# Patient Record
Sex: Female | Born: 1980 | Race: Black or African American | Hispanic: No | Smoking: Current every day smoker
Health system: Southern US, Community
[De-identification: ages and names within clinical notes are randomized; demographics above are authoritative.]

## PROBLEM LIST (undated history)

## (undated) HISTORY — PX: APPENDECTOMY: SHX54

---

## 2004-07-30 ENCOUNTER — Emergency Department: Payer: Self-pay | Admitting: Internal Medicine

## 2004-10-24 ENCOUNTER — Emergency Department: Payer: Self-pay | Admitting: Emergency Medicine

## 2004-11-29 ENCOUNTER — Emergency Department: Payer: Self-pay | Admitting: Emergency Medicine

## 2007-05-12 ENCOUNTER — Emergency Department (HOSPITAL_COMMUNITY): Admission: EM | Admit: 2007-05-12 | Discharge: 2007-05-12 | Payer: Self-pay | Admitting: Emergency Medicine

## 2007-05-12 ENCOUNTER — Ambulatory Visit: Payer: Self-pay | Admitting: *Deleted

## 2007-08-22 ENCOUNTER — Ambulatory Visit: Payer: Self-pay | Admitting: Internal Medicine

## 2008-02-01 ENCOUNTER — Emergency Department (HOSPITAL_COMMUNITY): Admission: EM | Admit: 2008-02-01 | Discharge: 2008-02-01 | Payer: Self-pay | Admitting: Emergency Medicine

## 2008-07-09 ENCOUNTER — Encounter (INDEPENDENT_AMBULATORY_CARE_PROVIDER_SITE_OTHER): Payer: Self-pay | Admitting: Internal Medicine

## 2008-07-09 ENCOUNTER — Ambulatory Visit: Payer: Self-pay | Admitting: Internal Medicine

## 2008-07-09 LAB — CONVERTED CEMR LAB
AST: 55 units/L — ABNORMAL HIGH (ref 0–37)
Calcium: 9 mg/dL (ref 8.4–10.5)
Glucose, Bld: 90 mg/dL (ref 70–99)
Potassium: 3.7 meq/L (ref 3.5–5.3)
Total Bilirubin: 0.4 mg/dL (ref 0.3–1.2)
Total Protein: 7.3 g/dL (ref 6.0–8.3)

## 2009-02-26 ENCOUNTER — Ambulatory Visit: Payer: Self-pay | Admitting: Family Medicine

## 2009-05-01 ENCOUNTER — Emergency Department (HOSPITAL_COMMUNITY): Admission: EM | Admit: 2009-05-01 | Discharge: 2009-05-01 | Payer: Self-pay | Admitting: Emergency Medicine

## 2009-05-12 ENCOUNTER — Emergency Department (HOSPITAL_COMMUNITY): Admission: EM | Admit: 2009-05-12 | Discharge: 2009-05-12 | Payer: Self-pay | Admitting: Emergency Medicine

## 2011-02-03 ENCOUNTER — Emergency Department: Payer: Self-pay | Admitting: Emergency Medicine

## 2011-12-22 ENCOUNTER — Emergency Department: Payer: Self-pay | Admitting: Emergency Medicine

## 2011-12-22 LAB — COMPREHENSIVE METABOLIC PANEL
Albumin: 3.8 g/dL (ref 3.4–5.0)
Alkaline Phosphatase: 76 U/L (ref 50–136)
Chloride: 107 mmol/L (ref 98–107)
Creatinine: 0.62 mg/dL (ref 0.60–1.30)
Glucose: 80 mg/dL (ref 65–99)
Osmolality: 275 (ref 275–301)
SGOT(AST): 28 U/L (ref 15–37)
SGPT (ALT): 34 U/L
Sodium: 139 mmol/L (ref 136–145)
Total Protein: 7.7 g/dL (ref 6.4–8.2)

## 2011-12-22 LAB — URINALYSIS, COMPLETE
Bilirubin,UR: NEGATIVE
Blood: NEGATIVE
Ketone: NEGATIVE
Nitrite: NEGATIVE
Ph: 6 (ref 4.5–8.0)
Protein: NEGATIVE
RBC,UR: <1 /HPF (ref 0–5)
Specific Gravity: 1.005 (ref 1.003–1.030)

## 2011-12-22 LAB — LIPASE, BLOOD: Lipase: 567 U/L — ABNORMAL HIGH (ref 73–393)

## 2011-12-22 LAB — HCG, QUANTITATIVE, PREGNANCY: Beta Hcg, Quant.: <1 m[IU]/mL — ABNORMAL LOW

## 2011-12-22 LAB — CBC
HCT: 33.8 % — ABNORMAL LOW (ref 35.0–47.0)
MCH: 25.5 pg — ABNORMAL LOW (ref 26.0–34.0)

## 2013-04-24 LAB — URINALYSIS, COMPLETE
Bacteria: NONE SEEN
Bilirubin,UR: NEGATIVE
Blood: NEGATIVE
Leukocyte Esterase: NEGATIVE
Nitrite: NEGATIVE
Ph: 7 (ref 4.5–8.0)
Protein: NEGATIVE
RBC,UR: <1 /HPF (ref 0–5)
Squamous Epithelial: 5

## 2013-04-24 LAB — CBC
HCT: 35.6 % (ref 35.0–47.0)
HGB: 11.7 g/dL — ABNORMAL LOW (ref 12.0–16.0)
MCH: 26.6 pg (ref 26.0–34.0)
MCHC: 32.9 g/dL (ref 32.0–36.0)
MCV: 81 fL (ref 80–100)
RDW: 17.5 % — ABNORMAL HIGH (ref 11.5–14.5)
WBC: 13.8 10*3/uL — ABNORMAL HIGH (ref 3.6–11.0)

## 2013-04-24 LAB — COMPREHENSIVE METABOLIC PANEL
Albumin: 4.1 g/dL (ref 3.4–5.0)
Alkaline Phosphatase: 81 U/L (ref 50–136)
Anion Gap: 5 — ABNORMAL LOW (ref 7–16)
BUN: 7 mg/dL (ref 7–18)
Calcium, Total: 9.1 mg/dL (ref 8.5–10.1)
Chloride: 105 mmol/L (ref 98–107)
Creatinine: 0.51 mg/dL — ABNORMAL LOW (ref 0.60–1.30)
EGFR (Non-African Amer.): >60
Osmolality: 266 (ref 275–301)
SGOT(AST): 43 U/L — ABNORMAL HIGH (ref 15–37)
Total Protein: 8.1 g/dL (ref 6.4–8.2)

## 2013-04-25 ENCOUNTER — Observation Stay: Payer: Self-pay | Admitting: Surgery

## 2013-04-26 LAB — PATHOLOGY REPORT

## 2014-01-24 DIAGNOSIS — F17200 Nicotine dependence, unspecified, uncomplicated: Secondary | ICD-10-CM | POA: Insufficient documentation

## 2014-11-01 NOTE — H&P (Signed)
   Subjective/Chief Complaint Right sided flank/RUQ pain, N/V/D x 1 day   History of Present Illness Jocelyn [name] is a pleasant 34 yo F who presents after waking up with right sided abdominal pain this am.  She says that it has gotten worse.  Worse with movement.  Also some loose BM and prior NB/NB emesis.  No appetite.  + subjective chills.  Has never had pain like this before   Past History HTN H/o tubal ligation   Past Medical Health Hypertension, Smoking   Past Med/Surgical Hx:  Hypertension:   None, patient reports no medical history.:   ALLERGIES:  No Known Allergies:   Family and Social History:  Family History Hypertension  Cancer  Breast ca   Social History positive  tobacco, positive ETOH, Social EtOH   [facility]  [place], here with husband, children   Review of Systems:  Subjective/Chief Complaint RUQ/R flank pain   Fever/Chills Yes   Cough No   Sputum No   Abdominal Pain Yes   Diarrhea No   Constipation No   Nausea/Vomiting Yes   SOB/DOE No   Chest Pain No   Dysuria No   Tolerating Diet Nauseated  Vomiting   Physical Exam:  GEN well developed, well nourished, no acute distress   HEENT pale conjunctivae, PERRL, moist oral mucosa   RESP normal resp effort  clear BS  no use of accessory muscles   CARD regular rate  no murmur   ABD positive tenderness  denies Flank Tenderness  no hernia  soft  normal BS  + rebound, + rovsig   LYMPH negative neck, negative axillae   EXTR negative cyanosis/clubbing   SKIN normal to palpation, No rashes, No ulcers, skin turgor good   NEURO cranial nerves intact, negative rigidity, negative tremor, follows commands, strength:, motor/sensory function intact   PSYCH alert, A+O to time, place, person, good insight    Assessment/Admission Diagnosis Jocelyn [name] presents with right flank/RUQ pain, leukocytosis, nausea/vomiting.  CT with dilated appendix with periappendiceal stranding with fecolithis.   Tender over presumed site of appendix.   Plan Clinical and radiographic appendicitis. To or for lap appendectomy.   Electronic Signatures: Jarvis NewcomerLundquist, Keysi Oelkers A (MD)  (Signed 14-Oct-14 20:18)  Authored: CHIEF COMPLAINT and HISTORY, PAST MEDICAL/SURGIAL HISTORY, ALLERGIES, FAMILY AND SOCIAL HISTORY, REVIEW OF SYSTEMS, PHYSICAL EXAM, ASSESSMENT AND PLAN   Last Updated: 14-Oct-14 20:18 by Jarvis NewcomerLundquist, Anaijah Augsburger A (MD)

## 2014-11-01 NOTE — Op Note (Signed)
PATIENT NAME:  Jocelyn [name], Jocelyn [name] MR#:  [id number] DATE OF BIRTH:  20-Dec-1980  DATE OF PROCEDURE:  04/24/2013  PREOPERATIVE DIAGNOSIS: Acute appendicitis.   POSTOPERATIVE DIAGNOSIS: Acute appendicitis with gangrenous tip.   PROCEDURE PERFORMED: Laparoscopic appendectomy.   ESTIMATED BLOOD LOSS:  15 mL.   COMPLICATIONS: None.   SPECIMEN: Appendix.  INDICATION FOR SURGERY: [name] is a pleasant, 34 year old female with history of right flank and upper quadrant pain and leukocytosis. She had a CT scan which showed a dilated appendix with periappendiceal stranding and appendicoliths. She was thus brought to the Operating Room for laparoscopic appendectomy.   DETAILS OF PROCEDURE: Informed consent was obtained. [name] was brought to the Operating Room suite. She was laid supine on the Operating Room table. She was induced. Endotracheal tube was placed, general anesthesia was administered. Her abdomen was then prepped and draped in standard surgical fashion. A timeout was then performed, correctly identifying the patient name, operative site, and procedure to be performed. A supraumbilical incision was made and was deepened down to the fascia. The fascia was incised. The peritoneum was entered. Two stay sutures were placed through the peritoneum. The Hasson trocar was placed in the abdomen. The abdomen was insufflated. Her CT and her clinical exam indicated appendix was in the right upper quadrant. I then placed a left upper quadrant 5 mm trocar under direct visualization, and an epigastric 5 mm trocar. The appendix was then visualized and grasped. It appeared to be normal at the base, at the cecum, and gangrenous and extremely inflamed at the tip I then used a [place] to make a hole in the mesoappendix at the base of the appendix. I then used an endoscopic stapler to transect the appendix flush with the base of the cecum. I then used a hook to mobilize the appendix off the underlying retroperitoneum and  kidney. Once I was able to mobilize the appendix enough, I used an endoscopic stapler across the mesoappendix. The appendix was then taken out with an Endo Catch bag through the umbilical port. I then irrigated the abdomen and looked at the appendiceal stump. It was hemostatic and flush with the cecum. I then also looked at the mesoappendix staple line. There was a small area of bleeding, which was controlled with point electrocautery. The trocars were then taken out under direct visualization. The abdomen was desufflated. The supraumbilical fascia was closed with a 0 Vicryl figure-of-eight.  At all port sites, the skin was then approximated with interrupted 4-0 Monocryl deep dermal interrupted sutures. Steri-Strips, Telfa gauze and Tegaderm were then used to complete the dressing. The patient was then awoken, extubated, and brought to the postanesthesia care unit. There were no immediate complications. Needle, sponge, and instrument count was correct at the end of the procedure.  ____________________________ Si Raiderhristopher A. Lani Havlik, MD cal:cg D: 04/24/2013 23:24:24 ET [name]: 04/24/2013 23:48:10 ET JOB#: [id number]  cc: Cristal Deerhristopher A. Annessa Satre, MD, <Dictator> Jarvis NewcomerHRISTOPHER A Nainika Newlun MD ELECTRONICALLY SIGNED 04/26/2013 20:11

## 2016-08-01 ENCOUNTER — Emergency Department: Admission: EM | Admit: 2016-08-01 | Discharge: 2016-08-01 | Discharge status: Absent without leave | Payer: Self-pay

## 2019-08-03 DIAGNOSIS — Z6834 Body mass index (BMI) 34.0-34.9, adult: Secondary | ICD-10-CM | POA: Insufficient documentation

## 2019-08-31 DIAGNOSIS — Z0000 Encounter for general adult medical examination without abnormal findings: Secondary | ICD-10-CM | POA: Insufficient documentation

## 2019-12-16 ENCOUNTER — Other Ambulatory Visit: Payer: Self-pay

## 2019-12-16 DIAGNOSIS — E1165 Type 2 diabetes mellitus with hyperglycemia: Secondary | ICD-10-CM | POA: Insufficient documentation

## 2019-12-16 DIAGNOSIS — E1142 Type 2 diabetes mellitus with diabetic polyneuropathy: Secondary | ICD-10-CM | POA: Insufficient documentation

## 2019-12-16 DIAGNOSIS — Z7984 Long term (current) use of oral hypoglycemic drugs: Secondary | ICD-10-CM | POA: Insufficient documentation

## 2019-12-16 DIAGNOSIS — D509 Iron deficiency anemia, unspecified: Secondary | ICD-10-CM | POA: Insufficient documentation

## 2019-12-16 LAB — CBC WITH DIFFERENTIAL/PLATELET
Abs Immature Granulocytes: 0.03 10*3/uL (ref 0.00–0.07)
Basophils Absolute: 0.1 10*3/uL (ref 0.0–0.1)
Basophils Relative: 1 %
Eosinophils Absolute: 0.1 10*3/uL (ref 0.0–0.5)
Eosinophils Relative: 1 %
HCT: 29.1 % — ABNORMAL LOW (ref 36.0–46.0)
Hemoglobin: 7.8 g/dL — ABNORMAL LOW (ref 12.0–15.0)
Immature Granulocytes: 0 %
Lymphocytes Relative: 20 %
Lymphs Abs: 2.2 10*3/uL (ref 0.7–4.0)
MCH: 17.6 pg — ABNORMAL LOW (ref 26.0–34.0)
MCHC: 26.8 g/dL — ABNORMAL LOW (ref 30.0–36.0)
MCV: 65.7 fL — ABNORMAL LOW (ref 80.0–100.0)
Monocytes Absolute: 0.9 10*3/uL (ref 0.1–1.0)
Monocytes Relative: 9 %
Neutro Abs: 7.5 10*3/uL (ref 1.7–7.7)
Neutrophils Relative %: 69 %
Platelets: 422 10*3/uL — ABNORMAL HIGH (ref 150–400)
RBC: 4.43 MIL/uL (ref 3.87–5.11)
RDW: 19.9 % — ABNORMAL HIGH (ref 11.5–15.5)
WBC: 10.8 10*3/uL — ABNORMAL HIGH (ref 4.0–10.5)
nRBC: 0.2 % (ref 0.0–0.2)

## 2019-12-16 LAB — BASIC METABOLIC PANEL
Anion gap: 11 (ref 5–15)
BUN: 5 mg/dL — ABNORMAL LOW (ref 6–20)
CO2: 24 mmol/L (ref 22–32)
Calcium: 9 mg/dL (ref 8.9–10.3)
Chloride: 102 mmol/L (ref 98–111)
Creatinine, Ser: 0.69 mg/dL (ref 0.44–1.00)
GFR calc Af Amer: >60 mL/min (ref >60)
GFR calc non Af Amer: >60 mL/min (ref >60)
Glucose, Bld: 212 mg/dL — ABNORMAL HIGH (ref 70–99)
Potassium: 3.6 mmol/L (ref 3.5–5.1)
Sodium: 137 mmol/L (ref 135–145)

## 2019-12-16 MED ORDER — ACETAMINOPHEN 325 MG PO TABS
650.0000 mg | ORAL_TABLET | Freq: Once | ORAL | Status: AC
  Administered 2019-12-16: 650 mg via ORAL
  Filled 2019-12-16: qty 2

## 2019-12-16 MED ORDER — SODIUM CHLORIDE 0.9 % IV BOLUS
1000.0000 mL | Freq: Once | INTRAVENOUS | Status: AC
  Administered 2019-12-16: 1000 mL via INTRAVENOUS

## 2019-12-16 MED ORDER — FENTANYL CITRATE (PF) 100 MCG/2ML IJ SOLN: 50.0000 ug | Freq: Once | INTRAMUSCULAR | Status: DC

## 2019-12-16 NOTE — ED Notes (Signed)
Spoke with Dr. Colon Branch about patient, see orders.

## 2019-12-16 NOTE — ED Triage Notes (Signed)
Patient reports tingling/numbness and pain in both legs and feet for the past week.  Patient tearful in triage.

## 2019-12-16 NOTE — ED Notes (Signed)
Lab called to report hemolysis of Type and Screen. This RN asked lab to come and draw the repeat.

## 2019-12-17 ENCOUNTER — Emergency Department
Admission: EM | Admit: 2019-12-17 | Discharge: 2019-12-17 | Disposition: A | Discharge status: Home / Self Care | Payer: Self-pay | Attending: Emergency Medicine | Admitting: Emergency Medicine

## 2019-12-17 DIAGNOSIS — D509 Iron deficiency anemia, unspecified: Secondary | ICD-10-CM

## 2019-12-17 DIAGNOSIS — E119 Type 2 diabetes mellitus without complications: Secondary | ICD-10-CM

## 2019-12-17 DIAGNOSIS — I739 Peripheral vascular disease, unspecified: Secondary | ICD-10-CM | POA: Diagnosis present

## 2019-12-17 DIAGNOSIS — I10 Essential (primary) hypertension: Secondary | ICD-10-CM | POA: Diagnosis present

## 2019-12-17 DIAGNOSIS — E1165 Type 2 diabetes mellitus with hyperglycemia: Secondary | ICD-10-CM

## 2019-12-17 DIAGNOSIS — G629 Polyneuropathy, unspecified: Secondary | ICD-10-CM

## 2019-12-17 HISTORY — DX: Iron deficiency anemia, unspecified: D50.9

## 2019-12-17 LAB — IRON AND TIBC
Iron: 15 ug/dL — ABNORMAL LOW (ref 28–170)
Saturation Ratios: 3 % — ABNORMAL LOW (ref 10.4–31.8)
TIBC: 556 ug/dL — ABNORMAL HIGH (ref 250–450)
UIBC: 541 ug/dL

## 2019-12-17 LAB — FERRITIN: Ferritin: 4 ng/mL — ABNORMAL LOW (ref 11–307)

## 2019-12-17 MED ORDER — ONDANSETRON 4 MG PO TBDP: 4.0000 mg | ORAL_TABLET | Freq: Three times a day (TID) | ORAL | 0 refills | Status: DC | PRN

## 2019-12-17 MED ORDER — METFORMIN HCL 500 MG PO TABS: 500.0000 mg | ORAL_TABLET | Freq: Two times a day (BID) | ORAL | 0 refills | Status: DC

## 2019-12-17 MED ORDER — IRON 325 (65 FE) MG PO TABS: 1.0000 | ORAL_TABLET | Freq: Every day | ORAL | 0 refills | Status: DC

## 2019-12-17 NOTE — ED Provider Notes (Signed)
[facility] Emergency Department Provider Note  ____________________________________________  Time seen: Approximately 1:29 AM  I have reviewed the triage vital signs and the nursing notes.   HISTORY  Chief Complaint Numbness    HPI Jocelyn [name] is a 39 y.o. female with no known past medical history who comes the ED complaining of bilateral foot and anterior leg pain for the past week.  Constant, worse with walking, no alleviating factors.  Not radiating.  Denies back pain.  No falls or recent trauma.  No motor weakness or change in balance or coordination.  No fevers or chills, no rash or wounds.   She reports normal menses, occurring approximately monthly, 5 to 7 days, described as light.  No black or bloody stools.   Past medical history noncontributory  Past surgical history includes appendectomy      Prior to Admission medications   Medication Sig Start Date End Date Taking? Authorizing Provider  Ferrous Sulfate (IRON) 325 (65 Fe) MG TABS Take 1 tablet (325 mg total) by mouth daily. 12/17/19 02/15/20  Sharman Cheek, MD  metFORMIN (GLUCOPHAGE) 500 MG tablet Take 1 tablet (500 mg total) by mouth 2 (two) times daily with a meal. 12/17/19   Sharman Cheek, MD  ondansetron (ZOFRAN ODT) 4 MG disintegrating tablet Take 1 tablet (4 mg total) by mouth every 8 (eight) hours as needed for nausea or vomiting. 12/17/19   Sharman Cheek, MD     Allergies Patient has no known allergies.   No family history on file.  Social History Social History   Tobacco Use   Smoking status: Not on file  Substance Use Topics   Alcohol use: Not on file   Drug use: Not on file    Review of Systems  Constitutional:   No fever or chills.  ENT:   No sore throat. No rhinorrhea. Cardiovascular:   No chest pain or syncope. Respiratory:   No dyspnea or cough. Gastrointestinal:   Negative for abdominal pain, vomiting and diarrhea.  Musculoskeletal:   Negative  for focal pain or swelling All other systems reviewed and are negative except as documented above in ROS and HPI.  ____________________________________________   PHYSICAL EXAM:  VITAL SIGNS: ED Triage Vitals  Enc Vitals Group     BP 12/16/19 1928 (!) 130/100     Pulse Rate 12/16/19 1928 (!) 119     Resp 12/16/19 1928 20     Temp 12/16/19 1928 99.1 F (37.3 C)     Temp Source 12/16/19 1928 Oral     SpO2 12/16/19 1928 99 %     Weight 12/16/19 1926 190 lb (86.2 kg)     Height 12/16/19 1926 5\' 5"  (1.651 m)     Head Circumference --      Peak Flow --      Pain Score 12/17/19 0049 6     Pain Loc --      Pain Edu? --      Excl. in [place]? --     Vital signs reviewed, nursing assessments reviewed.   Constitutional:   Alert and oriented. Non-toxic appearance. Eyes:   Conjunctivae are normal. EOMI. PERRL.  No nystagmus ENT      Head:   Normocephalic and atraumatic.      Nose:   Wearing a mask.      Mouth/Throat:   Wearing a mask.      Neck:   No meningismus. Full ROM. Hematological/Lymphatic/Immunilogical:   No cervical lymphadenopathy. Cardiovascular:  RRR. Symmetric bilateral radial and DP pulses.  No murmurs. Cap refill less than 2 seconds. Respiratory:   Normal respiratory effort without tachypnea/retractions. Breath sounds are clear and equal bilaterally. No wheezes/rales/rhonchi. Gastrointestinal:   Soft and nontender. Non distended. There is no CVA tenderness.  No rebound, rigidity, or guarding. Musculoskeletal:   Normal range of motion in all extremities. No joint effusions.  No lower extremity tenderness.  No edema.  Symmetric calf circumference, negative Homans Neurologic:   Normal speech and language.  Motor grossly intact. No pronator drift.  Normal finger-to-nose.  Normal gait. No acute focal neurologic deficits are appreciated.  Skin:    Skin is warm, dry and intact. No rash noted.  No petechiae, purpura, or bullae.  ____________________________________________     LABS (pertinent positives/negatives) (all labs ordered are listed, but only abnormal results are displayed) Labs Reviewed  CBC WITH DIFFERENTIAL/PLATELET - Abnormal; Notable for the following components:      Result Value   WBC 10.8 (*)    Hemoglobin 7.8 (*)    HCT 29.1 (*)    MCV 65.7 (*)    MCH 17.6 (*)    MCHC 26.8 (*)    RDW 19.9 (*)    Platelets 422 (*)    All other components within normal limits  BASIC METABOLIC PANEL - Abnormal; Notable for the following components:   Glucose, Bld 212 (*)    BUN 5 (*)    All other components within normal limits  IRON AND TIBC - Abnormal; Notable for the following components:   Iron 15 (*)    TIBC 556 (*)    Saturation Ratios 3 (*)    All other components within normal limits  FERRITIN - Abnormal; Notable for the following components:   Ferritin 4 (*)    All other components within normal limits  TYPE AND SCREEN  TYPE AND SCREEN   ____________________________________________   EKG  Interpreted by me Sinus tachycardia rate 121.  Normal axis and intervals.  Normal QRS ST segments and T waves.  No ischemic changes.  ____________________________________________    RADIOLOGY  No results found.  ____________________________________________   PROCEDURES Procedures  ____________________________________________    CLINICAL IMPRESSION / ASSESSMENT AND PLAN / ED COURSE  Medications ordered in the ED: Medications  fentaNYL (SUBLIMAZE) injection 50 mcg (50 mcg Intravenous Refused 12/16/19 2045)  sodium chloride 0.9 % bolus 1,000 mL (0 mLs Intravenous Stopped 12/17/19 0028)  acetaminophen (TYLENOL) tablet 650 mg (650 mg Oral Given 12/16/19 2050)    Pertinent labs & imaging results that were available during my care of the patient were reviewed by me and considered in my medical decision making (see chart for details).  Jocelyn [name] was evaluated in Emergency Department on 12/17/2019 for the symptoms described in the history of  present illness. She was evaluated in the context of the global COVID-19 pandemic, which necessitated consideration that the patient might be at risk for infection with the SARS-CoV-2 virus that causes COVID-19. Institutional protocols and algorithms that pertain to the evaluation of patients at risk for COVID-19 are in a state of rapid change based on information released by regulatory bodies including the CDC and federal and state organizations. These policies and algorithms were followed during the patient's care in the ED.   Patient presents with paresthesias of bilateral lower legs for the past week.  Exam is benign and reassuring.  Vital signs unremarkable on my exam.  Doubt stroke or intracranial hemorrhage tumor herniated disc or  spinal cord syndrome.  No evidence of skin or soft tissue infection, compartment syndrome, DVT.  She is wearing elastic waist leggings which could be causing some meralgia paresthetica syndrome, although this would not be expected to extend to the foot.  Chemistry panel shows a blood glucose of about 220, consistent with type 2 diabetes.  Most likely this is polyneuropathy related to uncontrolled diabetes and hyperglycemia and microcirculatory dysfunction.  She also has chronic anemia with a hemoglobin of 7.8 today but RBC level that is unchanged from 7 years ago.  No symptoms to suggest acute blood loss.  Iron panel consistent with iron deficiency.  I will start her on Metformin and iron supplement, instructed the patient to follow-up with her primary care doctor within 1 week.      ____________________________________________   FINAL CLINICAL IMPRESSION(S) / ED DIAGNOSES    Final diagnoses:  Polyneuropathy  Type 2 diabetes mellitus with hyperglycemia, without long-term current use of insulin (HCC)  Iron deficiency anemia, unspecified iron deficiency anemia type     ED Discharge Orders         Ordered    metFORMIN (GLUCOPHAGE) 500 MG tablet  2 times daily  with meals     12/17/19 0128    Ferrous Sulfate (IRON) 325 (65 Fe) MG TABS  Daily     12/17/19 0128    ondansetron (ZOFRAN ODT) 4 MG disintegrating tablet  Every 8 hours PRN     12/17/19 0128          Portions of this note were generated with dragon dictation software. Dictation errors may occur despite best attempts at proofreading.   Sharman Cheek, MD 12/17/19 [phone]

## 2019-12-21 DIAGNOSIS — R209 Unspecified disturbances of skin sensation: Secondary | ICD-10-CM | POA: Insufficient documentation

## 2020-01-04 ENCOUNTER — Emergency Department
Admission: EM | Admit: 2020-01-04 | Discharge: 2020-01-04 | Disposition: A | Discharge status: Home / Self Care | Payer: Self-pay | Attending: Emergency Medicine | Admitting: Emergency Medicine

## 2020-01-04 ENCOUNTER — Other Ambulatory Visit: Payer: Self-pay

## 2020-01-04 ENCOUNTER — Encounter: Payer: Self-pay | Admitting: Family Medicine

## 2020-01-04 ENCOUNTER — Emergency Department: Payer: Self-pay

## 2020-01-04 DIAGNOSIS — Z7902 Long term (current) use of antithrombotics/antiplatelets: Secondary | ICD-10-CM | POA: Insufficient documentation

## 2020-01-04 DIAGNOSIS — I743 Embolism and thrombosis of arteries of the lower extremities: Secondary | ICD-10-CM

## 2020-01-04 DIAGNOSIS — Z7982 Long term (current) use of aspirin: Secondary | ICD-10-CM | POA: Insufficient documentation

## 2020-01-04 DIAGNOSIS — I82412 Acute embolism and thrombosis of left femoral vein: Secondary | ICD-10-CM | POA: Insufficient documentation

## 2020-01-04 LAB — GLUCOSE, CAPILLARY: Glucose-Capillary: 90 mg/dL (ref 70–99)

## 2020-01-04 MED ORDER — OXYCODONE HCL 5 MG PO TABS
5.0000 mg | ORAL_TABLET | Freq: Once | ORAL | Status: AC
  Administered 2020-01-04: 5 mg via ORAL
  Filled 2020-01-04: qty 1

## 2020-01-04 MED ORDER — ASPIRIN EC 81 MG PO TBEC
81.0000 mg | DELAYED_RELEASE_TABLET | Freq: Every day | ORAL | 3 refills | Status: AC
Start: 2020-01-04 — End: 2021-01-03

## 2020-01-04 MED ORDER — CLOPIDOGREL BISULFATE 75 MG PO TABS
75.0000 mg | ORAL_TABLET | Freq: Every day | ORAL | Status: DC
  Administered 2020-01-04: 75 mg via ORAL
  Filled 2020-01-04: qty 1

## 2020-01-04 MED ORDER — OXYCODONE-ACETAMINOPHEN 10-325 MG PO TABS: 1.0000 | ORAL_TABLET | Freq: Four times a day (QID) | ORAL | 0 refills | Status: DC | PRN

## 2020-01-04 MED ORDER — CLOPIDOGREL BISULFATE 75 MG PO TABS: 75.0000 mg | ORAL_TABLET | Freq: Every day | ORAL | 3 refills | Status: DC

## 2020-01-04 NOTE — ED Provider Notes (Signed)
[facility] Emergency Department Provider Note ____________________________________________  Time seen: Approximately 7:35 AM  I have reviewed the triage vital signs and the nursing notes.   HISTORY  Chief Complaint Foot Pain    HPI Jocelyn [name] is a 39 y.o. female with a history of anemia and diabetes who presents to the emergency department for evaluation and treatment of left foot pain and swelling. Symptoms started upon awakening 3 weeks ago. No injury. She has been evaluated here and by primary care for the same. No relief with anything prescribed thus far.  Diabetes, Type 2 Iron deficiency anemia.   Prior to Admission medications   Medication Sig Start Date End Date Taking? Authorizing Provider  aspirin EC 81 MG tablet Take 1 tablet (81 mg total) by mouth daily. Swallow whole. 01/04/20 01/03/21  Stegmayer, Janalyn Harder, PA-C  clopidogrel (PLAVIX) 75 MG tablet Take 1 tablet (75 mg total) by mouth daily. 01/04/20   Stegmayer, Janalyn Harder, PA-C  Ferrous Sulfate (IRON) 325 (65 Fe) MG TABS Take 1 tablet (325 mg total) by mouth daily. 12/17/19 02/15/20  Carrie Mew, MD  metFORMIN (GLUCOPHAGE) 500 MG tablet Take 1 tablet (500 mg total) by mouth 2 (two) times daily with a meal. 12/17/19   Carrie Mew, MD  ondansetron (ZOFRAN ODT) 4 MG disintegrating tablet Take 1 tablet (4 mg total) by mouth every 8 (eight) hours as needed for nausea or vomiting. 12/17/19   Carrie Mew, MD  oxyCODONE-acetaminophen (PERCOCET) 10-325 MG tablet Take 1 tablet by mouth every 6 (six) hours as needed for pain. 01/04/20   Stegmayer, Janalyn Harder, PA-C    Allergies Patient has no known allergies.  No family history on file.  Social History Social History   Tobacco Use  . Smoking status: Not on file  Substance Use Topics  . Alcohol use: Not on file  . Drug use: Not on file    Review of Systems Constitutional: Negative for fever. Cardiovascular: Negative for chest  pain. Respiratory: Negative for shortness of breath. Musculoskeletal: Positive for left foot pain. Skin: Positive for swelling of the right foot.  Neurological: Negative for decrease in sensation  ____________________________________________   PHYSICAL EXAM:  VITAL SIGNS: ED Triage Vitals  Enc Vitals Group     BP 01/04/20 0614 (!) 159/115     Pulse Rate 01/04/20 0614 (!) 102     Resp 01/04/20 0614 18     Temp 01/04/20 0614 99.1 F (37.3 C)     Temp Source 01/04/20 0614 Oral     SpO2 01/04/20 0614 99 %     Weight 01/04/20 0615 186 lb (84.4 kg)     Height 01/04/20 0615 5\' 5"  (1.651 m)     Head Circumference --      Peak Flow --      Pain Score 01/04/20 0615 10     Pain Loc --      Pain Edu? --      Excl. in [place]? --     Constitutional: Alert and oriented. Well appearing and in no acute distress. Eyes: Conjunctivae are clear without discharge or drainage Head: Atraumatic Neck: Supple Respiratory: No cough. Respirations are even and unlabored. Musculoskeletal: Left foot slightly swollen, cooler than right foot, DP and PT pulse 2+, normal skin color. Neurologic: Motor and sensory function of left lower extremity intact.   Skin: Left foot slightly cooler than right, normal skin color.  Psychiatric: Affect and behavior are appropriate.  ____________________________________________   LABS (all labs  ordered are listed, but only abnormal results are displayed)  Labs Reviewed  GLUCOSE, CAPILLARY  CBG MONITORING, ED   ____________________________________________  RADIOLOGY  US of the left lower extremity shows no DVT. Incidental finding of occlusion of the distal femoropopliteal segment.  I, Kem Boroughs, personally viewed and evaluated these images (plain radiographs) as part of my medical decision making, as well as reviewing the written report by the radiologist.  US Venous Img Lower Unilateral Left  Result Date: 01/04/2020 CLINICAL DATA:  39 year old female with a  history of left foot swelling EXAM: LEFT LOWER EXTREMITY VENOUS DOPPLER ULTRASOUND TECHNIQUE: Gray-scale sonography with graded compression, as well as color Doppler and duplex ultrasound were performed to evaluate the lower extremity deep venous systems from the level of the common femoral vein and including the common femoral, femoral, profunda femoral, popliteal and calf veins including the posterior tibial, peroneal and gastrocnemius veins when visible. The superficial great saphenous vein was also interrogated. Spectral Doppler was utilized to evaluate flow at rest and with distal augmentation maneuvers in the common femoral, femoral and popliteal veins. COMPARISON:  None. FINDINGS: Contralateral Common Femoral Vein: Respiratory phasicity is normal and symmetric with the symptomatic side. No evidence of thrombus. Normal compressibility. Common Femoral Vein: No evidence of thrombus. Normal compressibility, respiratory phasicity and response to augmentation. Saphenofemoral Junction: No evidence of thrombus. Normal compressibility and flow on color Doppler imaging. Profunda Femoral Vein: No evidence of thrombus. Normal compressibility and flow on color Doppler imaging. Femoral Vein: No evidence of thrombus. Normal compressibility, respiratory phasicity and response to augmentation. Popliteal Vein: No evidence of thrombus. Normal compressibility, respiratory phasicity and response to augmentation. Calf Veins: No evidence of thrombus. Normal compressibility and flow on color Doppler imaging. Superficial Great Saphenous Vein: No evidence of thrombus. Normal compressibility and flow on color Doppler imaging. Other Findings: Incidental finding of occluded distal femoropopliteal segment. IMPRESSION: Sonographic survey of the left lower extremity negative for DVT. Incidental finding of occlusion of the distal femoropopliteal segment, unknown chronicity. Electronically Signed   By: Gilmer Mor D.O.   On: 01/04/2020  07:44   ____________________________________________   PROCEDURES  Procedures  ____________________________________________   INITIAL IMPRESSION / ASSESSMENT AND PLAN / ED COURSE  NICOLAS SISLER is a 39 y.o. who presents to the emergency department for treatment and evaluation of left foot pain with paresthesias. Foot is mildly swollen and cooler than right, but skin color and pulses are normal. Awaiting [place] results. Pain medication ordered.  Vascular consulted regarding incidental finding of occlusion of the distal femoropopliteal segment in the left lower extremity. Dr. Gilda Crease or associate will come evaluate.  Dr. Gilda Crease and PA evaluated patient. Will place orders for Plavix, Percocet, and have her follow up in the office on Monday.  Patient instructed to follow-up with vascular on Monday.Marland Kitchen  She was also instructed to return to the emergency department for symptoms that change or worsen if unable schedule an appointment with orthopedics or primary care.  Medications  clopidogrel (PLAVIX) tablet 75 mg (75 mg Oral Given 01/04/20 1207)  oxyCODONE (Oxy IR/ROXICODONE) immediate release tablet 5 mg (5 mg Oral Given 01/04/20 0740)    Pertinent labs & imaging results that were available during my care of the patient were reviewed by me and considered in my medical decision making (see chart for details).   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   Chinita Pester, FNP 01/04/20 1214    Minna Antis, MD 01/04/20 1515

## 2020-01-04 NOTE — ED Triage Notes (Signed)
Pt in with co left foot numbness states started 3 weeks ago. Was told it was due to diabetes. Pt here for persistent symptoms, swelling noted to left foot. States it is normal for her when she is up and walking.

## 2020-01-04 NOTE — ED Notes (Signed)
See triage note  Presents with left foot numbness   sxs' started about 3 weeks ago  Denies any injury  Positive swelling

## 2020-01-04 NOTE — ED Notes (Signed)
CBG 85 at this time.

## 2020-01-04 NOTE — ED Notes (Signed)
Awaiting for consult to see   States conts to have pain  Family at bedside

## 2020-01-04 NOTE — Discharge Instructions (Signed)
Please follow-up with Dr. Gilda Crease as advised on Monday.  Return to the emergency department if your pain worsens or if your skin gets cold or becomes pale.

## 2020-01-07 LAB — GLUCOSE, CAPILLARY: Glucose-Capillary: 85 mg/dL (ref 70–99)

## 2020-01-10 ENCOUNTER — Other Ambulatory Visit: Payer: Self-pay

## 2020-01-10 ENCOUNTER — Inpatient Hospital Stay
Admission: EM | Admit: 2020-01-10 | Discharge: 2020-01-12 | DRG: 272 | Disposition: A | Discharge status: Home / Self Care | Payer: Self-pay | Attending: Internal Medicine | Admitting: Internal Medicine

## 2020-01-10 ENCOUNTER — Encounter: Payer: Self-pay | Admitting: Emergency Medicine

## 2020-01-10 DIAGNOSIS — Z8249 Family history of ischemic heart disease and other diseases of the circulatory system: Secondary | ICD-10-CM

## 2020-01-10 DIAGNOSIS — D509 Iron deficiency anemia, unspecified: Secondary | ICD-10-CM | POA: Diagnosis present

## 2020-01-10 DIAGNOSIS — F17210 Nicotine dependence, cigarettes, uncomplicated: Secondary | ICD-10-CM | POA: Diagnosis present

## 2020-01-10 DIAGNOSIS — Z79899 Other long term (current) drug therapy: Secondary | ICD-10-CM

## 2020-01-10 DIAGNOSIS — E1151 Type 2 diabetes mellitus with diabetic peripheral angiopathy without gangrene: Principal | ICD-10-CM | POA: Diagnosis present

## 2020-01-10 DIAGNOSIS — Z7982 Long term (current) use of aspirin: Secondary | ICD-10-CM

## 2020-01-10 DIAGNOSIS — I10 Essential (primary) hypertension: Secondary | ICD-10-CM | POA: Diagnosis present

## 2020-01-10 DIAGNOSIS — I743 Embolism and thrombosis of arteries of the lower extremities: Secondary | ICD-10-CM | POA: Diagnosis present

## 2020-01-10 DIAGNOSIS — I7090 Unspecified atherosclerosis: Secondary | ICD-10-CM

## 2020-01-10 DIAGNOSIS — Z7984 Long term (current) use of oral hypoglycemic drugs: Secondary | ICD-10-CM

## 2020-01-10 DIAGNOSIS — Z20822 Contact with and (suspected) exposure to covid-19: Secondary | ICD-10-CM | POA: Diagnosis present

## 2020-01-10 DIAGNOSIS — I739 Peripheral vascular disease, unspecified: Secondary | ICD-10-CM | POA: Diagnosis present

## 2020-01-10 DIAGNOSIS — I998 Other disorder of circulatory system: Secondary | ICD-10-CM | POA: Diagnosis present

## 2020-01-10 DIAGNOSIS — I70222 Atherosclerosis of native arteries of extremities with rest pain, left leg: Secondary | ICD-10-CM | POA: Diagnosis present

## 2020-01-10 DIAGNOSIS — E119 Type 2 diabetes mellitus without complications: Secondary | ICD-10-CM

## 2020-01-10 DIAGNOSIS — Z7902 Long term (current) use of antithrombotics/antiplatelets: Secondary | ICD-10-CM

## 2020-01-10 LAB — APTT: aPTT: 31 seconds (ref 24–36)

## 2020-01-10 LAB — BASIC METABOLIC PANEL
Anion gap: 9 (ref 5–15)
BUN: 9 mg/dL (ref 6–20)
CO2: 25 mmol/L (ref 22–32)
Calcium: 9.6 mg/dL (ref 8.9–10.3)
Chloride: 101 mmol/L (ref 98–111)
Creatinine, Ser: 0.62 mg/dL (ref 0.44–1.00)
GFR calc Af Amer: >60 mL/min (ref >60)
GFR calc non Af Amer: >60 mL/min (ref >60)
Glucose, Bld: 148 mg/dL — ABNORMAL HIGH (ref 70–99)
Potassium: 4 mmol/L (ref 3.5–5.1)
Sodium: 135 mmol/L (ref 135–145)

## 2020-01-10 LAB — PREGNANCY, URINE: Preg Test, Ur: NEGATIVE

## 2020-01-10 LAB — CBC
HCT: 38.1 % (ref 36.0–46.0)
Hemoglobin: 10.9 g/dL — ABNORMAL LOW (ref 12.0–15.0)
MCH: 22.4 pg — ABNORMAL LOW (ref 26.0–34.0)
MCHC: 28.6 g/dL — ABNORMAL LOW (ref 30.0–36.0)
MCV: 78.2 fL — ABNORMAL LOW (ref 80.0–100.0)
Platelets: 646 10*3/uL — ABNORMAL HIGH (ref 150–400)
RBC: 4.87 MIL/uL (ref 3.87–5.11)
WBC: 7.9 10*3/uL (ref 4.0–10.5)
nRBC: 0 % (ref 0.0–0.2)

## 2020-01-10 LAB — PROTIME-INR
INR: 1 (ref 0.8–1.2)
Prothrombin Time: 13.2 seconds (ref 11.4–15.2)

## 2020-01-10 LAB — GLUCOSE, CAPILLARY: Glucose-Capillary: 94 mg/dL (ref 70–99)

## 2020-01-10 LAB — SARS CORONAVIRUS 2 BY RT PCR (HOSPITAL ORDER, PERFORMED IN ~~LOC~~ HOSPITAL LAB): SARS Coronavirus 2: NEGATIVE

## 2020-01-10 MED ORDER — SODIUM CHLORIDE 0.9 % IV SOLN: INTRAVENOUS | Status: DC

## 2020-01-10 MED ORDER — NICOTINE 21 MG/24HR TD PT24
21.0000 mg | MEDICATED_PATCH | Freq: Every day | TRANSDERMAL | Status: DC
  Filled 2020-01-10 (×2): qty 1

## 2020-01-10 MED ORDER — HYDROMORPHONE HCL 1 MG/ML IJ SOLN
1.0000 mg | INTRAMUSCULAR | Status: DC | PRN
  Administered 2020-01-10 – 2020-01-12 (×9): 1 mg via INTRAVENOUS
  Filled 2020-01-10 (×8): qty 1

## 2020-01-10 MED ORDER — ASPIRIN EC 81 MG PO TBEC
81.0000 mg | DELAYED_RELEASE_TABLET | Freq: Every day | ORAL | Status: DC
  Administered 2020-01-12: 81 mg via ORAL
  Filled 2020-01-10 (×2): qty 1

## 2020-01-10 MED ORDER — GABAPENTIN 300 MG PO CAPS
300.0000 mg | ORAL_CAPSULE | Freq: Three times a day (TID) | ORAL | Status: DC
  Administered 2020-01-12: 300 mg via ORAL
  Filled 2020-01-10 (×2): qty 1

## 2020-01-10 MED ORDER — OXYCODONE-ACETAMINOPHEN 5-325 MG PO TABS
1.0000 | ORAL_TABLET | Freq: Four times a day (QID) | ORAL | Status: DC | PRN
  Administered 2020-01-10 – 2020-01-12 (×3): 1 via ORAL
  Filled 2020-01-10 (×3): qty 1

## 2020-01-10 MED ORDER — ONDANSETRON HCL 4 MG/2ML IJ SOLN
4.0000 mg | Freq: Once | INTRAMUSCULAR | Status: AC
  Administered 2020-01-10: 4 mg via INTRAVENOUS
  Filled 2020-01-10: qty 2

## 2020-01-10 MED ORDER — HYDROMORPHONE HCL 1 MG/ML IJ SOLN
1.0000 mg | Freq: Once | INTRAMUSCULAR | Status: AC
  Administered 2020-01-10: 1 mg via INTRAVENOUS
  Filled 2020-01-10: qty 1

## 2020-01-10 MED ORDER — SODIUM CHLORIDE 0.9 % IV BOLUS
500.0000 mL | Freq: Once | INTRAVENOUS | Status: AC
  Administered 2020-01-10: 500 mL via INTRAVENOUS

## 2020-01-10 MED ORDER — SODIUM CHLORIDE 0.9 % IV BOLUS
1000.0000 mL | Freq: Once | INTRAVENOUS | Status: AC
  Administered 2020-01-10: 1000 mL via INTRAVENOUS

## 2020-01-10 MED ORDER — HEPARIN (PORCINE) 25000 UT/250ML-% IV SOLN
1200.0000 [IU]/h | INTRAVENOUS | Status: DC
  Administered 2020-01-10 – 2020-01-11 (×2): 1200 [IU]/h via INTRAVENOUS
  Filled 2020-01-10 (×3): qty 250

## 2020-01-10 MED ORDER — FERROUS SULFATE 325 (65 FE) MG PO TABS
325.0000 mg | ORAL_TABLET | Freq: Every day | ORAL | Status: DC
  Administered 2020-01-12: 325 mg via ORAL
  Filled 2020-01-10 (×2): qty 1

## 2020-01-10 MED ORDER — HEPARIN BOLUS VIA INFUSION
4000.0000 [IU] | Freq: Once | INTRAVENOUS | Status: AC
  Administered 2020-01-10: 4000 [IU] via INTRAVENOUS
  Filled 2020-01-10: qty 4000

## 2020-01-10 MED ORDER — LISINOPRIL 20 MG PO TABS
20.0000 mg | ORAL_TABLET | Freq: Every day | ORAL | Status: DC
  Administered 2020-01-11 – 2020-01-12 (×2): 20 mg via ORAL
  Filled 2020-01-10 (×2): qty 1

## 2020-01-10 MED ORDER — INSULIN ASPART 100 UNIT/ML ~~LOC~~ SOLN
0.0000 [IU] | Freq: Three times a day (TID) | SUBCUTANEOUS | Status: DC
  Filled 2020-01-10: qty 1

## 2020-01-10 MED ORDER — AMLODIPINE BESYLATE 5 MG PO TABS
5.0000 mg | ORAL_TABLET | Freq: Every day | ORAL | Status: DC
  Administered 2020-01-11 – 2020-01-12 (×2): 5 mg via ORAL
  Filled 2020-01-10 (×2): qty 1

## 2020-01-10 MED ORDER — HYDRALAZINE HCL 20 MG/ML IJ SOLN
5.0000 mg | INTRAMUSCULAR | Status: DC | PRN
  Filled 2020-01-10: qty 1

## 2020-01-10 MED ORDER — ONDANSETRON HCL 4 MG/2ML IJ SOLN: 4.0000 mg | Freq: Three times a day (TID) | INTRAMUSCULAR | Status: DC | PRN

## 2020-01-10 MED ORDER — ACETAMINOPHEN 325 MG PO TABS: 650.0000 mg | ORAL_TABLET | Freq: Four times a day (QID) | ORAL | Status: DC | PRN

## 2020-01-10 MED ORDER — OXYCODONE-ACETAMINOPHEN 10-325 MG PO TABS: 1.0000 | ORAL_TABLET | Freq: Four times a day (QID) | ORAL | Status: DC | PRN

## 2020-01-10 MED ORDER — INSULIN ASPART 100 UNIT/ML ~~LOC~~ SOLN: 0.0000 [IU] | Freq: Every day | SUBCUTANEOUS | Status: DC

## 2020-01-10 MED ORDER — OXYCODONE HCL 5 MG PO TABS
5.0000 mg | ORAL_TABLET | Freq: Four times a day (QID) | ORAL | Status: DC | PRN
  Administered 2020-01-10 – 2020-01-12 (×3): 5 mg via ORAL
  Filled 2020-01-10 (×3): qty 1

## 2020-01-10 NOTE — ED Triage Notes (Addendum)
Pt in via POV, known arterial occlusion to left leg per finding of last ER visit, given the option to be admitted or be seen outpatient on last visit, choosing to go home.  Vascular was unable to schedule her until 7/8, states she is unable to deal with pain until then.  To triage via wheelchair, NAD noted at this time.

## 2020-01-10 NOTE — ED Provider Notes (Signed)
[facility] Emergency Department Provider Note  Time seen: 3:25 PM  I have reviewed the triage vital signs and the nursing notes.   HISTORY  Chief Complaint DVT   HPI Jocelyn [name] is a 39 y.o. female with a past medical history of diabetes, presents to the emergency department for left leg pain.  According to the patient for the past 3 weeks or so she has been experiencing fairly significant but progressively worsening left lower extremity pain especially from the knee distally.  Patient states she has been having trouble walking due to the pain is having to use a cane due to the pain.  Patient was seen in the emergency department 01/04/2020 had ultrasound that was negative for DVT but did show a possible femoral/popliteal occlusion.  She was arranged to follow-up with vascular surgery however they state they would not be able to see her until the eighth.  Patient currently taking aspirin and Plavix, Percocet for pain control.  Patient denies any chest pain or shortness of breath.  Denies any fever.   Past Medical History:  Diagnosis Date  . Diabetes mellitus, type 2 (HCC) 12/17/2019  . IDA (iron deficiency anemia) 12/17/2019    There are no problems to display for this patient.   History reviewed. No pertinent surgical history.  Prior to Admission medications   Medication Sig Start Date End Date Taking? Authorizing Provider  aspirin EC 81 MG tablet Take 1 tablet (81 mg total) by mouth daily. Swallow whole. 01/04/20 01/03/21  Stegmayer, Ranae Plumber, PA-C  clopidogrel (PLAVIX) 75 MG tablet Take 1 tablet (75 mg total) by mouth daily. 01/04/20   Stegmayer, Ranae Plumber, PA-C  Ferrous Sulfate (IRON) 325 (65 Fe) MG TABS Take 1 tablet (325 mg total) by mouth daily. 12/17/19 02/15/20  Sharman Cheek, MD  metFORMIN (GLUCOPHAGE) 500 MG tablet Take 1 tablet (500 mg total) by mouth 2 (two) times daily with a meal. 12/17/19   Sharman Cheek, MD  ondansetron (ZOFRAN ODT) 4 MG  disintegrating tablet Take 1 tablet (4 mg total) by mouth every 8 (eight) hours as needed for nausea or vomiting. 12/17/19   Sharman Cheek, MD  oxyCODONE-acetaminophen (PERCOCET) 10-325 MG tablet Take 1 tablet by mouth every 6 (six) hours as needed for pain. 01/04/20   Stegmayer, Cala Bradford A, PA-C    No Known Allergies  No family history on file.  Social History Social History   Tobacco Use  . Smoking status: Current Every Day Smoker    Packs/day: 0.50    Types: Cigarettes  . Smokeless tobacco: Never Used  Vaping Use  . Vaping Use: Never used  Substance Use Topics  . Alcohol use: Yes  . Drug use: Never    Review of Systems Constitutional: Negative for fever. Cardiovascular: Negative for chest pain. Respiratory: Negative for shortness of breath. Gastrointestinal: Negative for abdominal pain Musculoskeletal: Right leg pain Skin: Negative for skin complaints  Neurological: Negative for headache All other ROS negative  ____________________________________________   PHYSICAL EXAM:  VITAL SIGNS: ED Triage Vitals  Enc Vitals Group     BP 01/10/20 1226 (!) 175/105     Pulse Rate 01/10/20 1226 (!) 116     Resp 01/10/20 1226 16     Temp 01/10/20 1226 98.5 F (36.9 C)     Temp Source 01/10/20 1226 Oral     SpO2 01/10/20 1226 100 %     Weight 01/10/20 1226 180 lb (81.6 kg)     Height 01/10/20 1226  5\' 5"  (1.651 m)     Head Circumference --      Peak Flow --      Pain Score 01/10/20 1225 10     Pain Loc --      Pain Edu? --      Excl. in GC? --    Constitutional: Alert and oriented. Well appearing and in no distress. Eyes: Normal exam ENT      Head: Normocephalic and atraumatic.      Mouth/Throat: Mucous membranes are moist. Cardiovascular: Normal rate, regular rhythm.  Respiratory: Normal respiratory effort without tachypnea nor retractions. Breath sounds are clear Gastrointestinal: Soft and nontender. No distention. Musculoskeletal: Moderate tenderness of the left  lower extremity.  No DP pulse palpated.  Positive posterior tibialis pulse.  Unable to palpate or Doppler a DP pulse. Neurologic:  Normal speech and language. No gross focal neurologic deficits Skin:  Skin is warm, dry and intact.  Psychiatric: Mood and affect are normal.   ____________________________________________  EKG viewed and interpreted by myself shows sinus tachycardia 136 bpm with a narrow QRS, normal axis, normal intervals, nonspecific ST changes.  INITIAL IMPRESSION / ASSESSMENT AND PLAN / ED COURSE  Pertinent labs & imaging results that were available during my care of the patient were reviewed by me and considered in my medical decision making (see chart for details).   Patient with increased pain to left lower extremity.  Unable to palpate or Doppler a DP pulse.  Strong posterior tibialis pulse.  Given the patient's pain we will check labs, pain control, start on a heparin infusion.  I spoke to Dr. 03/12/20 of vascular surgery.  We will admit to the medical service and will plan on likely angiography tomorrow.  Jocelyn [name] was evaluated in Emergency Department on 01/10/2020 for the symptoms described in the history of present illness. She was evaluated in the context of the global COVID-19 pandemic, which necessitated consideration that the patient might be at risk for infection with the SARS-CoV-2 virus that causes COVID-19. Institutional protocols and algorithms that pertain to the evaluation of patients at risk for COVID-19 are in a state of rapid change based on information released by regulatory bodies including the CDC and federal and state organizations. These policies and algorithms were followed during the patient's care in the ED.  CRITICAL CARE Performed by: 03/12/2020   Total critical care time: 30 minutes  Critical care time was exclusive of separately billable procedures and treating other patients.  Critical care was necessary to treat or prevent imminent or  life-threatening deterioration.  Critical care was time spent personally by me on the following activities: development of treatment plan with patient and/or surrogate as well as nursing, discussions with consultants, evaluation of patient's response to treatment, examination of patient, obtaining history from patient or surrogate, ordering and performing treatments and interventions, ordering and review of laboratory studies, ordering and review of radiographic studies, pulse oximetry and re-evaluation of patient's condition.   ____________________________________________   FINAL CLINICAL IMPRESSION(S) / ED DIAGNOSES  Arterial occlusion   Minna Antis, MD 01/10/20 1557

## 2020-01-10 NOTE — ED Notes (Signed)
Pt presentation discussed w/ EDP, Paduchowski.  See new orders.

## 2020-01-10 NOTE — Consult Note (Signed)
ANTICOAGULATION CONSULT NOTE - Initial Consult  Pharmacy Consult for Heparin Infusion  Indication: arterial occlusion  No Known Allergies  Patient Measurements: Height: 5\' 5"  (165.1 cm) Weight: 81.6 kg (180 lb) IBW/kg (Calculated) : 57 Heparin Dosing Weight: 74.4   Vital Signs: Temp: 98.5 F (36.9 C) (07/01 1226) Temp Source: Oral (07/01 1226) BP: 175/105 (07/01 1226) Pulse Rate: 116 (07/01 1226)  Labs: Recent Labs    01/10/20 1235  HGB 10.9*  HCT 38.1  PLT 646*  CREATININE 0.62    Estimated Creatinine Clearance: 98.6 mL/min (by C-G formula based on SCr of 0.62 mg/dL).  Medical History: Past Medical History:  Diagnosis Date  . Diabetes mellitus, type 2 (HCC) 12/17/2019  . IDA (iron deficiency anemia) 12/17/2019   Assessment: Pharmacy consulted for heparin infusion dosing and monitoring for 39 yo female admitted with arterial occlusion.   41 Venous LLL: Negative for DVT. Incidental finding of occlusion of the distal femoropopliteal segment, unknown chronicity.  Goal of Therapy:  Heparin level 0.3-0.7 units/ml Monitor platelets by anticoagulation protocol: Yes   Plan:  Baseline labs ordered Give 4000 units bolus x 1 Start heparin infusion at 1200 units/hr Check anti-Xa level in 6 hours and daily while on heparin Continue to monitor H&H and platelets  [place], PharmD, BCPS Clinical Pharmacist 01/10/2020 3:48 PM

## 2020-01-10 NOTE — H&P (Signed)
History and Physical    Jocelyn [name] NKN:[id number] DOB: [birth year] DOA: 01/10/2020  Referring MD/NP/PA:   [facility]P: [facility]   Patient coming from:  The patient is coming from home.  At baseline, pt is independent for most of ADL.        Chief Complaint: Left leg pain   HPI: Jocelyn [name] is a 39 y.o. female with medical history significant of hypertension, diabetes mellitus, iron deficiency anemia, tobacco abuse, who presents with left leg pain.  Patient states that she has been having left leg and left foot pain for almost 4 weeks. She was seen in ED on 6/25 and had lower extremity venous Doppler, which was negative for DVT, but it showed occlusion of the distal femoropopliteal segment with unknown chronicity in left leg. She was arranged to follow-up with vascular surgery, however they state they would not be able to see her until 7/8. Patient is currently taking aspirin and Plavix, Percocet for pain control. She states that her left leg and foot pain has been worsening.  It is constant, sharp, 10 out of 10 severity, nonradiating.  Patient does not have chest pain, shortness breath, cough, fever or chills.  No nausea, vomiting, diarrhea, abdominal pain, symptoms of UTI or unilateral weakness.  ED Course: pt was found to have WBC 7.9, pending COVID-19 PCR, electrolytes renal function okay, pending pregnancy test, temperature normal, blood pressure 138/87, tachycardia, RR 27, oxygen saturation 96% on room air.  Patient is admitted to MedSurg bed as inpatient.  Vascular surgeon, Dr. Wyn Quaker is consulted by EDP.  Review of Systems:   General: no fevers, chills, no body weight gain, has fatigue HEENT: no blurry vision, hearing changes or sore throat Respiratory: no dyspnea, coughing, wheezing CV: no chest pain, no palpitations GI: no nausea, vomiting, abdominal pain, diarrhea, constipation GU: no dysuria, burning on urination, increased urinary frequency, hematuria  Ext:  no leg edema. Has pain in left leg and foot. Neuro: no unilateral weakness, numbness, or tingling, no vision change or hearing loss Skin: no rash, no skin tear. MSK: No muscle spasm, no deformity, no limitation of range of movement in spin Heme: No easy bruising.  Travel history: No recent long distant travel.  Allergy: No Known Allergies  Past Medical History:  Diagnosis Date  . Diabetes mellitus, type 2 (HCC) 12/17/2019  . IDA (iron deficiency anemia) 12/17/2019    Past Surgical History:  Procedure Laterality Date  . APPENDECTOMY      Social History:  reports that she has been smoking cigarettes. She has been smoking about 0.50 packs per day. She has never used smokeless tobacco. She reports current alcohol use. She reports that she does not use drugs.  Family History:  Family History  Problem Relation Age of Onset  . Hypertension Mother   . Hypertension Sister   . Hypertension Brother      Prior to Admission medications   Medication Sig Start Date End Date Taking? Authorizing Provider  amLODipine (NORVASC) 5 MG tablet Take 5 mg by mouth daily. 09/06/19  Yes [provider]  aspirin EC 81 MG tablet Take 1 tablet (81 mg total) by mouth daily. Swallow whole. 01/04/20 01/03/21 Yes Stegmayer, Ranae Plumber, PA-C  clopidogrel (PLAVIX) 75 MG tablet Take 1 tablet (75 mg total) by mouth daily. 01/04/20  Yes Stegmayer, Ranae Plumber, PA-C  Ferrous Sulfate (IRON) 325 (65 Fe) MG TABS Take 1 tablet (325 mg total) by mouth daily. 12/17/19 02/15/20 Yes Sharman Cheek,  MD  gabapentin (NEURONTIN) 300 MG capsule Take 300 mg by mouth 3 (three) times daily. 12/21/19  Yes [provider]  lisinopril (ZESTRIL) 20 MG tablet Take 20 mg by mouth daily. 09/06/19  Yes [provider]  metFORMIN (GLUCOPHAGE) 500 MG tablet Take 1 tablet (500 mg total) by mouth 2 (two) times daily with a meal. 12/17/19  Yes Sharman Cheek, MD  ondansetron (ZOFRAN ODT) 4 MG disintegrating tablet Take 1  tablet (4 mg total) by mouth every 8 (eight) hours as needed for nausea or vomiting. 12/17/19  Yes Sharman Cheek, MD  oxyCODONE-acetaminophen (PERCOCET) 10-325 MG tablet Take 1 tablet by mouth every 6 (six) hours as needed for pain. 01/04/20  Yes Stegmayer, Ranae Plumber, PA-C    Physical Exam: Vitals:   01/10/20 1226 01/10/20 1530 01/10/20 1802  BP: (!) 175/105 138/87 (!) 152/91  Pulse: (!) 116 (!) 121 (!) 109  Resp: 16 (!) 27 17  Temp: 98.5 F (36.9 C)    TempSrc: Oral    SpO2: 100% 96% 97%  Weight: 81.6 kg    Height: 5\' 5"  (1.651 m)     General: Not in acute distress HEENT:       Eyes: PERRL, EOMI, no scleral icterus.       ENT: No discharge from the ears and nose, no pharynx injection, no tonsillar enlargement.        Neck: No JVD, no bruit, no mass felt. Heme: No neck lymph node enlargement. Cardiac: S1/S2, RRR, No murmurs, No gallops or rubs. Respiratory: No rales, wheezing, rhonchi or rubs. GI: Soft, nondistended, nontender, no rebound pain, no organomegaly, BS present. GU: No hematuria Ext: No pitting leg edema bilaterally. +PT pulse bilaterally. DP pulse is not present on the left leg Musculoskeletal: No joint deformities, No joint redness or warmth, no limitation of ROM in spin. Skin: No rashes.  Neuro: Alert, oriented X3, cranial nerves II-XII grossly intact, moves all extremities normally Psych: Patient is not psychotic, no suicidal or hemocidal ideation.  Labs on Admission: I have personally reviewed following labs and imaging studies  CBC: Recent Labs  Lab 01/10/20 1235  WBC 7.9  HGB 10.9*  HCT 38.1  MCV 78.2*  PLT 646*   Basic Metabolic Panel: Recent Labs  Lab 01/10/20 1235  NA 135  K 4.0  CL 101  CO2 25  GLUCOSE 148*  BUN 9  CREATININE 0.62  CALCIUM 9.6   GFR: Estimated Creatinine Clearance: 98.6 mL/min (by C-G formula based on SCr of 0.62 mg/dL). Liver Function Tests: No results for input(s): AST, ALT, ALKPHOS, BILITOT, PROT, ALBUMIN in the  last 168 hours. No results for input(s): LIPASE, AMYLASE in the last 168 hours. No results for input(s): AMMONIA in the last 168 hours. Coagulation Profile: Recent Labs  Lab 01/10/20 1551  INR 1.0   Cardiac Enzymes: No results for input(s): CKTOTAL, CKMB, CKMBINDEX, TROPONINI in the last 168 hours. BNP (last 3 results) No results for input(s): PROBNP in the last 8760 hours. HbA1C: No results for input(s): HGBA1C in the last 72 hours. CBG: Recent Labs  Lab 01/04/20 0617 01/04/20 0740  GLUCAP 85 90   Lipid Profile: No results for input(s): CHOL, HDL, LDLCALC, TRIG, CHOLHDL, LDLDIRECT in the last 72 hours. Thyroid Function Tests: No results for input(s): TSH, T4TOTAL, FREET4, T3FREE, THYROIDAB in the last 72 hours. Anemia Panel: No results for input(s): VITAMINB12, FOLATE, FERRITIN, TIBC, IRON, RETICCTPCT in the last 72 hours. Carotid, analysis:    Component Value Date/Time  COLORURINE Yellow 04/24/2013 1419   APPEARANCEUR Hazy 04/24/2013 1419   LABSPEC 1.018 04/24/2013 1419   PHURINE 7.0 04/24/2013 1419   GLUCOSEU Negative 04/24/2013 1419   HGBUR Negative 04/24/2013 1419   BILIRUBINUR Negative 04/24/2013 1419   KETONESUR Trace 04/24/2013 1419   PROTEINUR Negative 04/24/2013 1419   NITRITE Negative 04/24/2013 1419   LEUKOCYTESUR Negative 04/24/2013 1419   Sepsis Labs: @LABRCNTIP (procalcitonin:4,lacticidven:4) )No results found for this or any previous visit (from the past 240 hour(s)).   Radiological Exams on Admission: No results found.   EKG: Independently reviewed.  Sinus rhythm, QTC 435, tachycardia, nonspecific T wave change  Assessment/Plan Principal Problem:   PAD (peripheral artery disease) (HCC) Active Problems:   Diabetes mellitus without complication (HCC)   IDA (iron deficiency anemia)   HTN (hypertension)   Lower limb ischemia_left leg   PAD (peripheral artery disease) (HCC) and lwer limb ischemia_left leg: [facility]. Dr. is consulted -->will do  angiogram tomorrow  -Admit to MedSurg bed as inpatient -IV heparin started by ED, -As needed Dilaudid and Percocet for pain -NPO after midnight -hold plavix -continue ASA  Diabetes mellitus without complication (HCC): no A1c on record. Patient is taking Metformin at home -SSI -Check A1c  IDA (iron deficiency anemia): Hemoglobin 10.9 -Continue iron supplement  HTN (hypertension) -IV hydralazine as needed -Continue to amlodipine and lisinopril  Tobacco abuse: -Did counseling about importance of quitting smoking, particularly in the setting of PVD and ischemia limb of left leg -Nicotine patch       DVT ppx: on IV Heparin   Code Status: Full code Family Communication: not done, no family member is at bed side.  Disposition Plan:  Anticipate discharge back to previous environment Consults called: Dr. Wyn Quaker of [facility] Admission status: Med-surg bed as inpt        Status is: Inpatient  Remains inpatient appropriate because:Inpatient level of care appropriate due to severity of illness.  Patient has multiple comorbidities, now presents with severe PAD with ischemic limb of left leg.  Patient will need vascular surgical treatment.  Her presentation is highly complicated.  Patient is at high risk of deteriorating.  Patient will need to be treated in hospital for at least 2 days.   Dispo: The patient is from: Home              Anticipated d/c is to: Home              Anticipated d/c date is: 2 days              Patient currently is not medically stable to d/c.           Date of Service 01/10/2020    03/12/2020 [facility]   If 7PM-7AM, please contact night-coverage [email or web address] 01/10/2020, 6:25 PM

## 2020-01-10 NOTE — ED Notes (Signed)
Pt eating dinner

## 2020-01-10 NOTE — ED Notes (Signed)
ED Provider at bedside. 

## 2020-01-11 ENCOUNTER — Other Ambulatory Visit (INDEPENDENT_AMBULATORY_CARE_PROVIDER_SITE_OTHER): Payer: Self-pay | Admitting: Vascular Surgery

## 2020-01-11 ENCOUNTER — Encounter
Admission: EM | Disposition: A | Discharge status: Home / Self Care | Payer: Self-pay | Source: Home / Self Care | Attending: Internal Medicine

## 2020-01-11 DIAGNOSIS — I7090 Unspecified atherosclerosis: Secondary | ICD-10-CM

## 2020-01-11 DIAGNOSIS — I739 Peripheral vascular disease, unspecified: Secondary | ICD-10-CM

## 2020-01-11 DIAGNOSIS — I70222 Atherosclerosis of native arteries of extremities with rest pain, left leg: Secondary | ICD-10-CM

## 2020-01-11 HISTORY — PX: LOWER EXTREMITY ANGIOGRAPHY: CATH118251

## 2020-01-11 LAB — CBC
HCT: 30.8 % — ABNORMAL LOW (ref 36.0–46.0)
Hemoglobin: 8.9 g/dL — ABNORMAL LOW (ref 12.0–15.0)
MCH: 22.2 pg — ABNORMAL LOW (ref 26.0–34.0)
MCHC: 28.9 g/dL — ABNORMAL LOW (ref 30.0–36.0)
MCV: 76.8 fL — ABNORMAL LOW (ref 80.0–100.0)
Platelets: 496 10*3/uL — ABNORMAL HIGH (ref 150–400)
RBC: 4.01 MIL/uL (ref 3.87–5.11)
WBC: 7.1 10*3/uL (ref 4.0–10.5)
nRBC: 0 % (ref 0.0–0.2)

## 2020-01-11 LAB — GLUCOSE, CAPILLARY
Glucose-Capillary: 107 mg/dL — ABNORMAL HIGH (ref 70–99)
Glucose-Capillary: 136 mg/dL — ABNORMAL HIGH (ref 70–99)
Glucose-Capillary: 104 mg/dL — ABNORMAL HIGH (ref 70–99)
Glucose-Capillary: 108 mg/dL — ABNORMAL HIGH (ref 70–99)
Glucose-Capillary: 141 mg/dL — ABNORMAL HIGH (ref 70–99)

## 2020-01-11 LAB — BASIC METABOLIC PANEL
Anion gap: 7 (ref 5–15)
BUN: 9 mg/dL (ref 6–20)
CO2: 24 mmol/L (ref 22–32)
Calcium: 8.5 mg/dL — ABNORMAL LOW (ref 8.9–10.3)
Chloride: 108 mmol/L (ref 98–111)
Creatinine, Ser: 0.57 mg/dL (ref 0.44–1.00)
GFR calc Af Amer: >60 mL/min (ref >60)
GFR calc non Af Amer: >60 mL/min (ref >60)
Glucose, Bld: 118 mg/dL — ABNORMAL HIGH (ref 70–99)
Potassium: 3.9 mmol/L (ref 3.5–5.1)
Sodium: 139 mmol/L (ref 135–145)

## 2020-01-11 LAB — HIV ANTIBODY (ROUTINE TESTING W REFLEX): HIV Screen 4th Generation wRfx: NONREACTIVE

## 2020-01-11 LAB — HEPARIN LEVEL (UNFRACTIONATED)
Heparin Unfractionated: 0.42 IU/mL (ref 0.30–0.70)
Heparin Unfractionated: 0.46 IU/mL (ref 0.30–0.70)

## 2020-01-11 SURGERY — LOWER EXTREMITY ANGIOGRAPHY
Anesthesia: Moderate Sedation | Laterality: Left

## 2020-01-11 MED ORDER — DIPHENHYDRAMINE HCL 50 MG/ML IJ SOLN: 50.0000 mg | Freq: Once | INTRAMUSCULAR | Status: DC | PRN

## 2020-01-11 MED ORDER — MIDAZOLAM HCL 2 MG/2ML IJ SOLN
INTRAMUSCULAR | Status: AC
  Filled 2020-01-11: qty 2

## 2020-01-11 MED ORDER — ONDANSETRON HCL 4 MG/2ML IJ SOLN: 4.0000 mg | Freq: Four times a day (QID) | INTRAMUSCULAR | Status: DC | PRN

## 2020-01-11 MED ORDER — HEPARIN SODIUM (PORCINE) 1000 UNIT/ML IJ SOLN
INTRAMUSCULAR | Status: DC | PRN
  Administered 2020-01-11: 5000 [IU] via INTRAVENOUS

## 2020-01-11 MED ORDER — MIDAZOLAM HCL 5 MG/5ML IJ SOLN
INTRAMUSCULAR | Status: AC
  Filled 2020-01-11: qty 5

## 2020-01-11 MED ORDER — CEFAZOLIN SODIUM-DEXTROSE 2-4 GM/100ML-% IV SOLN
2.0000 g | Freq: Once | INTRAVENOUS | Status: DC
  Filled 2020-01-11: qty 100

## 2020-01-11 MED ORDER — METHYLPREDNISOLONE SODIUM SUCC 125 MG IJ SOLR: 125.0000 mg | Freq: Once | INTRAMUSCULAR | Status: DC | PRN

## 2020-01-11 MED ORDER — FENTANYL CITRATE (PF) 100 MCG/2ML IJ SOLN
INTRAMUSCULAR | Status: AC
  Filled 2020-01-11: qty 2

## 2020-01-11 MED ORDER — FENTANYL CITRATE (PF) 100 MCG/2ML IJ SOLN
INTRAMUSCULAR | Status: DC | PRN
  Administered 2020-01-11: 25 ug via INTRAVENOUS
  Administered 2020-01-11: 50 ug via INTRAVENOUS
  Administered 2020-01-11 (×2): 25 ug via INTRAVENOUS

## 2020-01-11 MED ORDER — SODIUM CHLORIDE 0.9 % IV SOLN: INTRAVENOUS | Status: DC

## 2020-01-11 MED ORDER — TIROFIBAN HCL IV 12.5 MG/250 ML
INTRAVENOUS | Status: AC
  Administered 2020-01-11: 2040 ug via INTRAVENOUS
  Filled 2020-01-11: qty 250

## 2020-01-11 MED ORDER — HEPARIN SODIUM (PORCINE) 1000 UNIT/ML IJ SOLN
INTRAMUSCULAR | Status: AC
  Filled 2020-01-11: qty 1

## 2020-01-11 MED ORDER — TIROFIBAN (AGGRASTAT) BOLUS VIA INFUSION
25.0000 ug/kg | Freq: Once | INTRAVENOUS | Status: AC
  Filled 2020-01-11: qty 41

## 2020-01-11 MED ORDER — FAMOTIDINE 20 MG PO TABS: 40.0000 mg | ORAL_TABLET | Freq: Once | ORAL | Status: DC | PRN

## 2020-01-11 MED ORDER — CEFAZOLIN SODIUM-DEXTROSE 2-4 GM/100ML-% IV SOLN
INTRAVENOUS | Status: AC
  Filled 2020-01-11: qty 100

## 2020-01-11 MED ORDER — MIDAZOLAM HCL 2 MG/2ML IJ SOLN
INTRAMUSCULAR | Status: DC | PRN
  Administered 2020-01-11 (×3): 1 mg via INTRAVENOUS
  Administered 2020-01-11: 2 mg via INTRAVENOUS

## 2020-01-11 MED ORDER — HYDROMORPHONE HCL 1 MG/ML IJ SOLN
1.0000 mg | Freq: Once | INTRAMUSCULAR | Status: DC | PRN
  Filled 2020-01-11: qty 1

## 2020-01-11 MED ORDER — HYDROMORPHONE HCL 1 MG/ML IJ SOLN
INTRAMUSCULAR | Status: AC
  Administered 2020-01-11: 1 mg
  Filled 2020-01-11: qty 1

## 2020-01-11 MED ORDER — MIDAZOLAM HCL 2 MG/ML PO SYRP
8.0000 mg | ORAL_SOLUTION | Freq: Once | ORAL | Status: DC | PRN
  Filled 2020-01-11: qty 4

## 2020-01-11 MED ORDER — TIROFIBAN HCL IN NACL 5-0.9 MG/100ML-% IV SOLN
0.1500 ug/kg/min | INTRAVENOUS | Status: AC
  Administered 2020-01-12: 0.15 ug/kg/min via INTRAVENOUS
  Filled 2020-01-11 (×4): qty 100

## 2020-01-11 SURGICAL SUPPLY — 23 items
BALLN ULTRVRSE 2.5X220X150 (BALLOONS) ×3
BALLN ULTRVRSE 3X220X150 (BALLOONS) ×3
BALLN ULTRVRSE 4X220X150 (BALLOONS) ×2
BALLN ULTRVRSE 4X220X150 OTW (BALLOONS) ×1
BALLN ULTRVRSE 5X150X150 (BALLOONS) ×3
BALLOON ULTRVRSE 2.5X220X150 (BALLOONS) IMPLANT
BALLOON ULTRVRSE 3X220X150 (BALLOONS) IMPLANT
BALLOON ULTRVRSE 4X220X150 OTW (BALLOONS) IMPLANT
BALLOON ULTRVRSE 5X150X150 (BALLOONS) IMPLANT
CANISTER PENUMBRA ENGINE (MISCELLANEOUS) ×2 IMPLANT
CATH ANGIO 5F PIGTAIL 65CM (CATHETERS) ×2 IMPLANT
CATH BEACON 5 .038 100 VERT TP (CATHETERS) ×2 IMPLANT
CATH INDIGO CAT6 KIT (CATHETERS) ×2 IMPLANT
DEVICE PRESTO INFLATION (MISCELLANEOUS) ×2 IMPLANT
DEVICE SAFEGUARD 24CM (GAUZE/BANDAGES/DRESSINGS) ×2 IMPLANT
DEVICE STARCLOSE SE CLOSURE (Vascular Products) ×2 IMPLANT
GLIDEWIRE ADV .035X260CM (WIRE) ×2 IMPLANT
PACK ANGIOGRAPHY (CUSTOM PROCEDURE TRAY) ×2 IMPLANT
SHEATH BRITE TIP 5FRX11 (SHEATH) ×2 IMPLANT
SHEATH DESTIN RDC 6FR 45 (SHEATH) ×2 IMPLANT
STENT VIABAHN 6X250X120 (Permanent Stent) ×2 IMPLANT
WIRE G V18X300CM (WIRE) ×2 IMPLANT
WIRE J 3MM .035X145CM (WIRE) ×2 IMPLANT

## 2020-01-11 NOTE — Consult Note (Signed)
ANTICOAGULATION CONSULT NOTE - Initial Consult  Pharmacy Consult for Heparin Infusion  Indication: arterial occlusion  No Known Allergies  Patient Measurements: Height: 5\' 5"  (165.1 cm) Weight: 81.6 kg (180 lb) IBW/kg (Calculated) : 57 Heparin Dosing Weight: 74.4   Vital Signs: Temp: 98.2 F (36.8 C) (07/02 0308) Temp Source: Oral (07/02 0308) BP: 149/98 (07/02 0308) Pulse Rate: 105 (07/02 0308)  Labs: Recent Labs    01/10/20 1235 01/10/20 1551 01/10/20 2330 01/11/20 0456  HGB 10.9*  --   --   --   HCT 38.1  --   --   --   PLT 646*  --   --   --   APTT  --  31  --   --   LABPROT  --  13.2  --   --   INR  --  1.0  --   --   HEPARINUNFRC  --   --  0.46 0.42  CREATININE 0.62  --   --  0.57    Estimated Creatinine Clearance: 98.6 mL/min (by C-G formula based on SCr of 0.57 mg/dL).  Medical History: Past Medical History:  Diagnosis Date  . Diabetes mellitus, type 2 (HCC) 12/17/2019  . IDA (iron deficiency anemia) 12/17/2019   Assessment: Pharmacy consulted for heparin infusion dosing and monitoring for 39 yo female admitted with arterial occlusion.   41 Venous LLL: Negative for DVT. Incidental finding of occlusion of the distal femoropopliteal segment, unknown chronicity.  Goal of Therapy:  Heparin level 0.3-0.7 units/ml Monitor platelets by anticoagulation protocol: Yes   Plan:  07/02 @ 0500 HL 0.42 therapeutic. Will continue current rate and will recheck HL w/ am labs and continue to monitor.  09/02, PharmD, BCPS Clinical Pharmacist 01/11/2020 5:59 AM

## 2020-01-11 NOTE — Consult Note (Signed)
ANTICOAGULATION CONSULT NOTE - Initial Consult  Pharmacy Consult for Heparin Infusion  Indication: arterial occlusion  No Known Allergies  Patient Measurements: Height: 5\' 5"  (165.1 cm) Weight: 81.6 kg (180 lb) IBW/kg (Calculated) : 57 Heparin Dosing Weight: 74.4   Vital Signs: Temp: 98.2 F (36.8 C) (07/01 2325) Temp Source: Oral (07/01 2325) BP: 160/96 (07/02 0017) Pulse Rate: 92 (07/01 2325)  Labs: Recent Labs    01/10/20 1235 01/10/20 1551 01/10/20 2330  HGB 10.9*  --   --   HCT 38.1  --   --   PLT 646*  --   --   APTT  --  31  --   LABPROT  --  13.2  --   INR  --  1.0  --   HEPARINUNFRC  --   --  0.46  CREATININE 0.62  --   --     Estimated Creatinine Clearance: 98.6 mL/min (by C-G formula based on SCr of 0.62 mg/dL).  Medical History: Past Medical History:  Diagnosis Date   Diabetes mellitus, type 2 (HCC) 12/17/2019   IDA (iron deficiency anemia) 12/17/2019   Assessment: Pharmacy consulted for heparin infusion dosing and monitoring for 39 yo female admitted with arterial occlusion.   41 Venous LLL: Negative for DVT. Incidental finding of occlusion of the distal femoropopliteal segment, unknown chronicity.  Goal of Therapy:  Heparin level 0.3-0.7 units/ml Monitor platelets by anticoagulation protocol: Yes   Plan:  07/01 @ 2330 HL 0.46 therapeutic. Will continue current rate and will recheck HL w/ am labs and continue to monitor.  09/01, PharmD, BCPS Clinical Pharmacist 01/11/2020 12:36 AM

## 2020-01-11 NOTE — H&P (Signed)
[facility] History & Physical Update  The patient was interviewed and re-examined.  The patient's previous History and Physical has been reviewed and is unchanged.  There is no change in the plan of care. We plan to proceed with the scheduled procedure.  [name], MD  01/11/2020, 12:40 PM

## 2020-01-11 NOTE — Consult Note (Signed)
ANTICOAGULATION CONSULT NOTE  Pharmacy Consult for Heparin Infusion  Indication: arterial occlusion  No Known Allergies  Patient Measurements: Height: 5\' 5"  (165.1 cm) Weight: 81.6 kg (180 lb) IBW/kg (Calculated) : 57 Heparin Dosing Weight: 74.4   Vital Signs: Temp: 98.2 F (36.8 C) (07/02 0308) Temp Source: Oral (07/02 0308) BP: 149/98 (07/02 0308) Pulse Rate: 105 (07/02 0308)  Labs: Recent Labs    01/10/20 1235 01/10/20 1551 01/10/20 2330 01/11/20 0456  HGB 10.9*  --   --  8.9*  HCT 38.1  --   --  30.8*  PLT 646*  --   --  496*  APTT  --  31  --   --   LABPROT  --  13.2  --   --   INR  --  1.0  --   --   HEPARINUNFRC  --   --  0.46 0.42  CREATININE 0.62  --   --  0.57    Estimated Creatinine Clearance: 98.6 mL/min (by C-G formula based on SCr of 0.57 mg/dL).  Medical History: Past Medical History:  Diagnosis Date  . Diabetes mellitus, type 2 (HCC) 12/17/2019  . IDA (iron deficiency anemia) 12/17/2019   Assessment: Pharmacy consulted for heparin infusion dosing and monitoring for 39 yo female admitted with arterial occlusion. The patient is currently undergoing a left lower extremity angiogram with possible intervention at this time.  41 Venous LLL: Negative for DVT. Incidental finding of occlusion of the distal femoropopliteal segment, unknown chronicity.  Heparin Course: 7/1 initiation: 1200 units/hr 7/1 2330 HL 0.46: no change 7/1 0500 HL 0.42: no change 7/1 1342 heparin stopped for angiogram   Goal of Therapy:  Heparin level 0.3-0.7 units/ml Monitor platelets by anticoagulation protocol: Yes   Plan:   Await vascular surgery decision on restarting heparin following angiogram   Check heparin level 6 hours after presumptive restart  CBC in am 7/2  9/2, PharmD, BCPS Clinical Pharmacist 01/11/2020 7:54 AM

## 2020-01-11 NOTE — Progress Notes (Signed)
[facility]  - [place] at [facility]   PATIENT NAME: Jocelyn [name]    MR#:  [id number]  DATE OF BIRTH:  Jan 09, 1981  SUBJECTIVE:  patient came in with left lower extremity pain for 23 weeks. She was supposed to see vascular as outpatient on how were pain gotten worse came to the ER. She is on heparin drip. Plan for lower extremity angiogram this afternoon by Dr. dew.  REVIEW OF SYSTEMS:   Review of Systems  Constitutional: Negative for chills, fever and weight loss.  HENT: Negative for ear discharge, ear pain and nosebleeds.   Eyes: Negative for blurred vision, pain and discharge.  Respiratory: Negative for sputum production, shortness of breath, wheezing and stridor.   Cardiovascular: Negative for chest pain, palpitations, orthopnea and PND.  Gastrointestinal: Negative for abdominal pain, diarrhea, nausea and vomiting.  Genitourinary: Negative for frequency and urgency.  Musculoskeletal: Negative for back pain and joint pain.       Left leg pain  Neurological: Negative for sensory change, speech change, focal weakness and weakness.  Psychiatric/Behavioral: Negative for depression and hallucinations. The patient is not nervous/anxious.    Tolerating Diet; npo Tolerating PT:   DRUG ALLERGIES:  No Known Allergies  VITALS:  Blood pressure (!) 152/92, pulse 88, temperature 98.4 F (36.9 C), temperature source Oral, resp. rate (!) 27, height 5\' 5"  (1.651 m), weight 81.6 kg, SpO2 98 %.  PHYSICAL EXAMINATION:   Physical Exam  GENERAL:  39 y.o.-year-old patient lying in the bed with no acute distress.  EYES: Pupils equal, round, reactive to light and accommodation. No scleral icterus.   HEENT: Head atraumatic, normocephalic. Oropharynx and nasopharynx clear.  NECK:  Supple, no jugular venous distention. No thyroid enlargement, no tenderness.  LUNGS: Normal breath sounds bilaterally, no wheezing, rales, rhonchi. No use of accessory muscles of respiration.   CARDIOVASCULAR: S1, S2 normal. No murmurs, rubs, or gallops.  ABDOMEN: Soft, nontender, nondistended. Bowel sounds present. No organomegaly or mass.  EXTREMITIES: No cyanosis, clubbing or edema b/l.    NEUROLOGIC: Cranial nerves II through XII are intact. No focal Motor or sensory deficits b/l.   PSYCHIATRIC:  patient is alert and oriented x 3.  SKIN: No obvious rash, lesion, or ulcer.   LABORATORY PANEL:  CBC Recent Labs  Lab 01/11/20 0456  WBC 7.1  HGB 8.9*  HCT 30.8*  PLT 496*    Chemistries  Recent Labs  Lab 01/11/20 0456  NA 139  K 3.9  CL 108  CO2 24  GLUCOSE 118*  BUN 9  CREATININE 0.57  CALCIUM 8.5*   Cardiac Enzymes No results for input(s): TROPONINI in the last 168 hours. RADIOLOGY:  No results found. ASSESSMENT AND PLAN:   Jocelyn [name] a 39 y.o.femalewith medical history significant ofhypertension, diabetes mellitus, iron deficiency anemia, tobacco abuse, who presents with left leg pain. Patient states that she has been having left leg and left foot pain for almost 4 weeks.She was seen in ED on 6/25 and hadlower extremity venous Doppler, which was negative for DVT, but it showedocclusion of the distal femoropopliteal segment withunknown chronicity in left leg.  PAD (peripheral artery disease) (HCC) and lower limb ischemia_left leg: [facility]. Dr. 7/25 is consulted -->will do angiogram today -IV heparin started by ED, -As needed Dilaudid and Percocet for pain -NPO after midnight -hold plavix -continue ASA  Diabetes mellitus without complication (HCC): no A1c on record.  -Patient is taking Metformin at home--hold it -SSI -Check A1c  IDA (iron deficiency  anemia): Hemoglobin 10.9 -Continue iron supplement  HTN (hypertension) -IV hydralazine as needed -Continue to amlodipine and lisinopril  SevereTobacco abuse: -Did counseling about importance of quitting smoking, particularly in the setting of PVD and ischemia limb of left leg -Nicotine  patch -pt not motivated for smoking cessation--smoking since age 39 years    DVT ppx: on IV Heparin   Code Status: Full code Family Communication: not done, no family member is at bed side.  Disposition Plan:  Anticipate discharge back to previous environment Consults called: Dr. Wyn Quaker of [facility] Admission status: Med-surg bed as inpt    Status is: Inpatient  Remains inpatient appropriate because:Inpatient level of care appropriate due to severity of illness.  Patient has multiple comorbidities, now presents with severe PAD with ischemic limb of left leg.  Patient will need vascular surgical treatment.  Her presentation is highly complicated.  Patient is at high risk of deteriorating.  Patient will need to be treated in hospital for at least 2 days.   Dispo: The patient is from: Home  Anticipated d/c is to: Home  Anticipated d/c date is: 2 days  Patient currently is not medically stable to d/c. getting LE angiogram today      TOTAL TIME TAKING CARE OF THIS PATIENT: 35 minutes.  >50% time spent on counselling and coordination of care  Note: This dictation was prepared with Dragon dictation along with smaller phrase technology. Any transcriptional errors that result from this process are unintentional.  Enedina Finner M.D    [facility]   CC: Primary care physician; [facility]: Jocelyn [name], female   DOB: 07/10/1981, 39 y.o.   MRN: [id number]

## 2020-01-11 NOTE — Consult Note (Signed)
[facility] Vascular Consult Note  MRN : [id number]  Jocelyn [name] is a 39 y.o. (October 02, 1980) female who presents with chief complaint of  Chief Complaint  Patient presents with  . DVT   History of Present Illness:  Jocelyn [name] is a 39 y.o. female with medical history significant of hypertension, diabetes mellitus, iron deficiency anemia, tobacco abuse, who presents with left leg pain.  Patient states that she has been having left leg and left foot pain for almost 4 weeks. She was seen in ED on 6/25 and had lower extremity venous Doppler, which was negative for DVT, but it showed occlusion of the distal femoropopliteal segment with unknown chronicity in left leg. She was arranged to follow-up with vascular surgery, the following Monday however voicemails that were left on the patient's phone were not returned.  The next available appointment once the patient called her office back was January 17, 2020.  Patient is currently taking aspirin and Plavix, Percocet for pain control. She states that her left leg and foot pain has been worsening.  It is constant, sharp, 10 out of 10 severity, nonradiating.  Patient does not have chest pain, shortness breath, cough, fever or chills.  No nausea, vomiting, diarrhea, abdominal pain, symptoms of UTI or unilateral weakness.  Vascular surgery was consulted by Dr. Lenard Lance for possible endovascular revascularization.  Current Facility-Administered Medications  Medication Dose Route Frequency Provider Last Rate Last Admin  . 0.9 %  sodium chloride infusion   Intravenous Continuous Lorretta Harp, MD 75 mL/hr at 01/11/20 0919 New Bag at 01/11/20 0919  . acetaminophen (TYLENOL) tablet 650 mg  650 mg Oral Q6H PRN Lorretta Harp, MD      . amLODipine (NORVASC) tablet 5 mg  5 mg Oral Daily Lorretta Harp, MD   5 mg at 01/11/20 1287  . aspirin EC tablet 81 mg  81 mg Oral Daily Lorretta Harp, MD      . Melene Muller ON 01/12/2020] ceFAZolin (ANCEF) IVPB 2g/100  mL premix  2 g Intravenous Once Nikan Ellingson A, PA-C      . diphenhydrAMINE (BENADRYL) injection 50 mg  50 mg Intravenous Once PRN Aysen Shieh A, PA-C      . famotidine (PEPCID) tablet 40 mg  40 mg Oral Once PRN Skarlett Sedlacek A, PA-C      . ferrous sulfate tablet 325 mg  325 mg Oral Daily Lorretta Harp, MD      . gabapentin (NEURONTIN) capsule 300 mg  300 mg Oral TID Lorretta Harp, MD      . heparin ADULT infusion 100 units/mL (25000 units/283mL sodium chloride 0.45%)  1,200 Units/hr Intravenous Continuous Lorretta Harp, MD 12 mL/hr at 01/11/20 1053 1,200 Units/hr at 01/11/20 1053  . hydrALAZINE (APRESOLINE) injection 5 mg  5 mg Intravenous Q2H PRN Lorretta Harp, MD      . HYDROmorphone (DILAUDID) injection 1 mg  1 mg Intravenous Q3H PRN Lorretta Harp, MD   1 mg at 01/11/20 0919  . HYDROmorphone (DILAUDID) injection 1 mg  1 mg Intravenous Once PRN Karlos Scadden A, PA-C      . insulin aspart (novoLOG) injection 0-5 Units  0-5 Units Subcutaneous QHS Lorretta Harp, MD      . insulin aspart (novoLOG) injection 0-9 Units  0-9 Units Subcutaneous TID WC Lorretta Harp, MD      . lisinopril (ZESTRIL) tablet 20 mg  20 mg Oral Daily Lorretta Harp, MD   20 mg at 01/11/20 8676  . methylPREDNISolone  sodium succinate (SOLU-MEDROL) 125 mg/2 mL injection 125 mg  125 mg Intravenous Once PRN Gearldine Looney A, PA-C      . midazolam (VERSED) 2 MG/ML syrup 8 mg  8 mg Oral Once PRN Sheza Strickland A, PA-C      . nicotine (NICODERM CQ - dosed in mg/24 hours) patch 21 mg  21 mg Transdermal Daily Lorretta Harp, MD      . ondansetron (ZOFRAN) injection 4 mg  4 mg Intravenous Q8H PRN Lorretta Harp, MD      . ondansetron (ZOFRAN) injection 4 mg  4 mg Intravenous Q6H PRN Samil Mecham A, PA-C      . oxyCODONE-acetaminophen (PERCOCET/ROXICET) 5-325 MG per tablet 1 tablet  1 tablet Oral Q6H PRN Lorretta Harp, MD   1 tablet at 01/10/20 2102   And  . oxyCODONE (Oxy IR/ROXICODONE) immediate release tablet 5 mg  5 mg Oral  Q6H PRN Lorretta Harp, MD   5 mg at 01/10/20 2102   Past Medical History:  Diagnosis Date  . Diabetes mellitus, type 2 (HCC) 12/17/2019  . IDA (iron deficiency anemia) 12/17/2019   Past Surgical History:  Procedure Laterality Date  . APPENDECTOMY     Social History Social History   Tobacco Use  . Smoking status: Current Every Day Smoker    Packs/day: 0.50    Types: Cigarettes  . Smokeless tobacco: Never Used  Vaping Use  . Vaping Use: Never used  Substance Use Topics  . Alcohol use: Yes  . Drug use: Never   Family History Family History  Problem Relation Age of Onset  . Hypertension Mother   . Hypertension Sister   . Hypertension Brother   Denies family history of peripheral artery disease, venous disease or renal disease.  No Known Allergies  REVIEW OF SYSTEMS (Negative unless checked)  Constitutional: [] Weight loss  [] Fever  [] Chills Cardiac: [] Chest pain   [] Chest pressure   [] Palpitations   [] Shortness of breath when laying flat   [] Shortness of breath at rest   [] Shortness of breath with exertion. Vascular:  [x] Pain in legs with walking   [x] Pain in legs at rest   [x] Pain in legs when laying flat   [] Claudication   [] Pain in feet when walking  [] Pain in feet at rest  [] Pain in feet when laying flat   [] History of DVT   [] Phlebitis   [] Swelling in legs   [] Varicose veins   [] Non-healing ulcers Pulmonary:   [] Uses home oxygen   [] Productive cough   [] Hemoptysis   [] Wheeze  [] COPD   [] Asthma Neurologic:  [] Dizziness  [] Blackouts   [] Seizures   [] History of stroke   [] History of TIA  [] Aphasia   [] Temporary blindness   [] Dysphagia   [] Weakness or numbness in arms   [] Weakness or numbness in legs Musculoskeletal:  [] Arthritis   [] Joint swelling   [] Joint pain   [] Low back pain Hematologic:  [] Easy bruising  [] Easy bleeding   [] Hypercoagulable state   [] Anemic  [] Hepatitis Gastrointestinal:  [] Blood in stool   [] Vomiting blood  [] Gastroesophageal reflux/heartburn    [] Difficulty swallowing. Genitourinary:  [] Chronic kidney disease   [] Difficult urination  [] Frequent urination  [] Burning with urination   [] Blood in urine Skin:  [] Rashes   [] Ulcers   [] Wounds Psychological:  [] History of anxiety   []  History of major depression.  Physical Examination  Vitals:   01/10/20 2021 01/10/20 2325 01/11/20 0017 01/11/20 0308  BP: (!) 154/92 (!) 166/106 (!) 160/96 (!) 149/98  Pulse: 99  92  (!) 105  Resp: 20   18  Temp: 98.7 F (37.1 C) 98.2 F (36.8 C)  98.2 F (36.8 C)  TempSrc: Oral Oral  Oral  SpO2: 100% 99%  100%  Weight:      Height:       Body mass index is 29.95 kg/m. Gen:  WD/WN, NAD Head: [place]/AT, No temporalis wasting. Prominent temp pulse not noted. Ear/Nose/Throat: Hearing grossly intact, nares w/o erythema or drainage, oropharynx w/o Erythema/Exudate Eyes: Sclera non-icteric, conjunctiva clear Neck: Trachea midline.  No JVD.  Pulmonary:  Good air movement, respirations not labored, equal bilaterally.  Cardiac: RRR, normal S1, S2. Vascular:  Vessel Right Left  Radial Palpable Palpable  Ulnar Palpable Palpable  Brachial Palpable Palpable  Carotid Palpable, without bruit Palpable, without bruit  Aorta Not palpable N/A  Femoral Palpable Palpable  Popliteal Palpable Palpable  PT Palpable Non-Palpable  DP Palpable Non-Palpable   Gastrointestinal: soft, non-tender/non-distended. No guarding/reflex.  Musculoskeletal: M/S 5/5 throughout.  Extremities without ischemic changes.  No deformity or atrophy. No edema. Neurologic: Sensation grossly intact in extremities.  Symmetrical.  Speech is fluent. Motor exam as listed above. Psychiatric: Judgment intact, Mood & affect appropriate for pt's clinical situation. Dermatologic: No rashes or ulcers noted.  No cellulitis or open wounds. Lymph : No Cervical, Axillary, or Inguinal lymphadenopathy.  CBC Lab Results  Component Value Date   WBC 7.1 01/11/2020   HGB 8.9 (L) 01/11/2020   HCT 30.8 (L)  01/11/2020   MCV 76.8 (L) 01/11/2020   PLT 496 (H) 01/11/2020   BMET    Component Value Date/Time   NA 139 01/11/2020 0456   NA 134 (L) 04/24/2013 1557   K 3.9 01/11/2020 0456   K 4.4 04/24/2013 1557   CL 108 01/11/2020 0456   CL 105 04/24/2013 1557   CO2 24 01/11/2020 0456   CO2 24 04/24/2013 1557   GLUCOSE 118 (H) 01/11/2020 0456   GLUCOSE 103 (H) 04/24/2013 1557   BUN 9 01/11/2020 0456   BUN 7 04/24/2013 1557   CREATININE 0.57 01/11/2020 0456   CREATININE 0.51 (L) 04/24/2013 1557   CALCIUM 8.5 (L) 01/11/2020 0456   CALCIUM 9.1 04/24/2013 1557   GFRNONAA >60 01/11/2020 0456   GFRNONAA >60 04/24/2013 1557   GFRAA >60 01/11/2020 0456   GFRAA >60 04/24/2013 1557   Estimated Creatinine Clearance: 98.6 mL/min (by C-G formula based on SCr of 0.57 mg/dL).  COAG Lab Results  Component Value Date   INR 1.0 01/10/2020   Radiology US Venous Img Lower Unilateral Left  Result Date: 01/04/2020 CLINICAL DATA:  39 year old female with a history of left foot swelling EXAM: LEFT LOWER EXTREMITY VENOUS DOPPLER ULTRASOUND TECHNIQUE: Gray-scale sonography with graded compression, as well as color Doppler and duplex ultrasound were performed to evaluate the lower extremity deep venous systems from the level of the common femoral vein and including the common femoral, femoral, profunda femoral, popliteal and calf veins including the posterior tibial, peroneal and gastrocnemius veins when visible. The superficial great saphenous vein was also interrogated. Spectral Doppler was utilized to evaluate flow at rest and with distal augmentation maneuvers in the common femoral, femoral and popliteal veins. COMPARISON:  None. FINDINGS: Contralateral Common Femoral Vein: Respiratory phasicity is normal and symmetric with the symptomatic side. No evidence of thrombus. Normal compressibility. Common Femoral Vein: No evidence of thrombus. Normal compressibility, respiratory phasicity and response to  augmentation. Saphenofemoral Junction: No evidence of thrombus. Normal compressibility and flow on color Doppler imaging.  Profunda Femoral Vein: No evidence of thrombus. Normal compressibility and flow on color Doppler imaging. Femoral Vein: No evidence of thrombus. Normal compressibility, respiratory phasicity and response to augmentation. Popliteal Vein: No evidence of thrombus. Normal compressibility, respiratory phasicity and response to augmentation. Calf Veins: No evidence of thrombus. Normal compressibility and flow on color Doppler imaging. Superficial Great Saphenous Vein: No evidence of thrombus. Normal compressibility and flow on color Doppler imaging. Other Findings: Incidental finding of occluded distal femoropopliteal segment. IMPRESSION: Sonographic survey of the left lower extremity negative for DVT. Incidental finding of occlusion of the distal femoropopliteal segment, unknown chronicity. Electronically Signed   By: Gilmer MorJaime  Wagner D.O.   On: 01/04/2020 07:44   Assessment/Plan The patient is a 39 year old female well-known to our service as we saw her in the emergency department on January 04, 2020.  The patient was offered an appointment the following Monday in our office and 2 voicemails were left on her phone however her call was not returned.  Once she did contact the office an appointment for July 8 was made however her left lower extremity discomfort progressed and she sought medical attention in our emergency department  1.  Left lower extremity atherosclerotic disease with rest pain: Patient with occlusion in the distal femoropopliteal arteries.  In the setting of known occlusion with what sounds like rest pain recommend the patient undergo a left lower extremity angiogram with possible intervention and attempt assess the patient's anatomy and contributing degree of atherosclerotic disease.  If appropriate, an attempt to revascularize leg made at that time.  Procedure, risks and benefits  were explained to the patient.  All questions were answered.  Patient wishes to proceed.  This will take place today with Dr. Wyn Quakerew.  2.  Atherosclerotic disease: Patient is currently on aspirin and Plavix for medical management. We will consider the addition of statin.  3.  Nicotine dependence: We had a discussion for approximately three minutes regarding the absolute need for smoking cessation due to the deleterious nature of tobacco on the vascular system. We discussed the tobacco use would diminish patency of any intervention, and likely significantly worsen progression of disease. We discussed multiple agents for quitting including replacement therapy or medications to reduce cravings such as Chantix. The patient voices their understanding of the importance of smoking cessation.  4.  Diabetes Encouraged good control as its slows the progression of atherosclerotic disease  Discussed with Dr. Weldon Inchesew  Muaaz Brau A Ivana Nicastro, PA-C  01/11/2020 11:57 AM    This note was created with Dragon medical transcription system.  Any error is purely unintentional

## 2020-01-11 NOTE — Op Note (Signed)
[facility]  Percutaneous Study/Intervention Procedural Note   Date of Surgery: 01/11/2020  Surgeon(s):Hagar Sadiq    Assistants:none  Pre-operative Diagnosis: Ischemia of the left leg with rest [name]  Post-operative diagnosis:  Same  Procedure(s) Performed:             1.  Ultrasound guidance for vascular access right femoral artery             2.  Catheter placement into left common femoral artery from right femoral approach             3.  Aortogram and selective left lower extremity angiogram including selective image of the posterior tibial artery             4.  Percutaneous transluminal angioplasty of left posterior tibial artery and tibioperoneal trunk with 2-1/2 and 3 mm diameter angioplasty balloons             5.   Percutaneous transluminal angioplasty of the left popliteal artery and SFA with 4 mm diameter Lutonix drug-coated angioplasty balloon  6.  Mechanical thrombectomy with the penumbra cat 6 device to the left SFA, popliteal artery, tibioperoneal trunk, and posterior tibial arteries with the penumbra cat 6 device  7.  Viabahn covered stent placement to the most distal SFA, popliteal artery, and tibioperoneal trunk with 6 mm diameter by 25 cm length stent for residual stenosis and thrombus after above procedures             8.  StarClose closure device right femoral artery  EBL: 250 cc  Contrast: 65 cc  Fluoro Time: 14.5 minutes  Moderate Conscious Sedation Time: approximately 60 minutes using 5 mg of Versed and 125 mcg of Fentanyl              Indications:  Patient is a 39 y.o.female with ischemic rest [name] left foot over the past 3 weeks. The patient has noninvasive study showing occlusion of the SFA and popliteal arteries.  This was seen incidentally during a DVT study. The patient is brought in for angiography for further evaluation and potential treatment.  Due to the limb threatening nature of the situation, angiogram was  performed for attempted limb salvage. The patient is aware that if the procedure fails, amputation would be expected.  The patient also understands that even with successful revascularization, amputation may still be required due to the severity of the situation.  Risks and benefits are discussed and informed consent is obtained.   Procedure:  The patient was identified and appropriate procedural time out was performed.  The patient was then placed supine on the table and prepped and draped in the usual sterile fashion. Moderate conscious sedation was administered during a face to face encounter with the patient throughout the procedure with my supervision of the RN administering medicines and monitoring the patient's vital signs, pulse oximetry, telemetry and mental status throughout from the start of the procedure until the patient was taken to the recovery room. Ultrasound was used to evaluate the right common femoral artery.  It was patent .  A digital ultrasound image was acquired.  A Seldinger needle was used to access the right common femoral artery under direct ultrasound guidance and a permanent image was performed.  A 0.035 J wire was advanced without resistance and a 5Fr sheath was placed.  Pigtail catheter was placed into the aorta and an AP aortogram was performed. This demonstrated normal renal arteries and normal aorta and  iliac segments without significant stenosis. I then crossed the aortic bifurcation and advanced to the left femoral head. Selective left lower extremity angiogram was then performed. This demonstrated normal common femoral artery, profunda femoris artery, and proximal to mid superficial femoral artery.  In the mid to distal superficial femoral artery there is an abrupt occlusion with occlusion of the popliteal artery, the tibioperoneal trunk, and the proximal portion of all 3 tibial vessels.  [name] in the  midsegment.  This was then continuous to the foot. It was felt that it was in the patient's best interest to proceed with intervention after these images to avoid a second procedure and a larger amount of contrast and fluoroscopy based off of the findings from the initial angiogram. The patient was systemically heparinized and a 6 [place] destination sheath was then placed over the Terumo Advantage wire. I then used a Kumpe catheter and the advantage wire to easily navigate through the occlusion in the SFA and popliteal arteries but the tibial vessels were little more difficult.  Initially I got down into the peroneal artery but imaging showed this to be occluded.  I then was able to navigate a V 18 wire and the Kumpe catheter down into the posterior tibial artery and across the occlusion and then perform selective imaging the posterior tibial artery which was then continuous into the foot.  I then replaced the wire and proceeded with treatment.  Several passes were made with the penumbra cat 6 device in the left SFA, popliteal artery, tibioperoneal trunk, and proximal posterior tibial artery.  Small improvements were seen but there remained minimal flow distally.  I then took a 2-1/2 mm to 22 cm length angioplasty balloon from the mid posterior tibial artery up to the tibioperoneal trunk and inflated this to 12 atm with no results and then used a 3 mm diameter by 22 cm length angioplasty balloon for the mid posterior tibial artery up to the tibioperoneal trunk and inflated this to 10 atm for 1 minute.  For the popliteal artery and superficial femoral artery a 4 mm diameter by 22 cm length Lutonix drug-coated angioplasty balloon was inflated to 10 atm for 1 minute completion imaging now showed the posterior tibial artery to be patent with less than 10% residual stenosis but there was still a near occlusive stenosis and thrombus in the proximal tibioperoneal trunk as well as in the popliteal artery.  At this point, I  felt we had no other option other than to place a covered stent over these areas as it was still prothrombotic as well as stenotic.  A 6 mm diameter by 25 cm length Viabahn stent was deployed from the most distal SFA across the popliteal artery and into the tibioperoneal trunk staying above the tibioperoneal trunk bifurcation.  This is postdilated with a 4 mm balloon distally and a 5 mm balloon proximally with excellent angiographic completion result and less than 10% residual stenosis.  She now had brisk one-vessel runoff distally. I elected to terminate the procedure. The sheath was removed and StarClose closure device was deployed in the right femoral artery with excellent hemostatic result. The patient was taken to the recovery room in stable condition having tolerated the procedure well.  Findings:               Aortogram:  Normal renal arteries, normal aorta and iliac arteries without significant stenosis  Left lower Extremity:  Normal common femoral artery, profunda femoris artery, and proximal to mid superficial femoral artery.  In the mid to distal superficial femoral artery there is an abrupt occlusion with occlusion of the popliteal artery, the tibioperoneal trunk, and the proximal portion of all 3 tibial vessels.  [name] in the midsegment.  This was then continuous to the foot.   Disposition: Patient was taken to the recovery room in stable condition having tolerated the procedure well.  Complications: None  Jocelyn [name] 01/11/2020 3:46 PM   This note was created with Dragon Medical transcription system. Any errors in dictation are purely unintentional.

## 2020-01-12 LAB — CBC
HCT: 25.6 % — ABNORMAL LOW (ref 36.0–46.0)
Hemoglobin: 7.2 g/dL — ABNORMAL LOW (ref 12.0–15.0)
MCH: 22.4 pg — ABNORMAL LOW (ref 26.0–34.0)
MCHC: 28.1 g/dL — ABNORMAL LOW (ref 30.0–36.0)
MCV: 79.5 fL — ABNORMAL LOW (ref 80.0–100.0)
Platelets: 434 10*3/uL — ABNORMAL HIGH (ref 150–400)
RBC: 3.22 MIL/uL — ABNORMAL LOW (ref 3.87–5.11)
WBC: 6.9 10*3/uL (ref 4.0–10.5)
nRBC: 0 % (ref 0.0–0.2)

## 2020-01-12 LAB — GLUCOSE, CAPILLARY
Glucose-Capillary: 100 mg/dL — ABNORMAL HIGH (ref 70–99)
Glucose-Capillary: 122 mg/dL — ABNORMAL HIGH (ref 70–99)

## 2020-01-12 MED ORDER — OXYCODONE-ACETAMINOPHEN 10-325 MG PO TABS: 1.0000 | ORAL_TABLET | Freq: Four times a day (QID) | ORAL | 0 refills | Status: DC | PRN

## 2020-01-12 MED ORDER — APIXABAN 5 MG PO TABS
5.0000 mg | ORAL_TABLET | Freq: Two times a day (BID) | ORAL | Status: DC
  Administered 2020-01-12: 5 mg via ORAL
  Filled 2020-01-12: qty 1

## 2020-01-12 MED ORDER — APIXABAN 5 MG PO TABS: 5.0000 mg | ORAL_TABLET | Freq: Two times a day (BID) | ORAL | 2 refills | Status: DC

## 2020-01-12 MED ORDER — OXYCODONE-ACETAMINOPHEN 5-325 MG PO TABS
1.0000 | ORAL_TABLET | ORAL | Status: DC | PRN
  Administered 2020-01-12: 1 via ORAL
  Filled 2020-01-12: qty 1

## 2020-01-12 MED ORDER — NICOTINE 21 MG/24HR TD PT24: 21.0000 mg | MEDICATED_PATCH | Freq: Every day | TRANSDERMAL | 0 refills | Status: DC

## 2020-01-12 MED ORDER — OXYCODONE HCL 5 MG PO TABS: 5.0000 mg | ORAL_TABLET | Freq: Four times a day (QID) | ORAL | Status: DC | PRN

## 2020-01-12 NOTE — Discharge Instructions (Signed)
Smoking cessation advised.

## 2020-01-12 NOTE — TOC Progression Note (Signed)
Transition of Care [facility]) - Progression Note    Patient Details  Name: Jocelyn [name] MRN: [id number] Date of Birth: [birth year]  Transition of Care [facility]) CM/SW Contact  Daneka Lantigua, [place], [place] Phone Number: 01/12/2020, 10:26 AM  Clinical Narrative:    Patient to discharge home today. Patient's provider requested Eliquis coupon- provided to patient at bedsie. Patient verbalized having no additional discharge needs.  [address] LCSW Transition of Care [phone]         Expected Discharge Plan and Services           Expected Discharge Date: 01/12/20                                     Social Determinants of Health (SDOH) Interventions    Readmission Risk Interventions No flowsheet data found.

## 2020-01-12 NOTE — Discharge Summary (Signed)
[facility] [place] [facility]   PATIENT NAME: Jocelyn [name]    MR#:  [id number]  DATE OF BIRTH:  1980/09/30  DATE OF ADMISSION:  01/10/2020 ADMITTING PHYSICIAN: Lorretta Harp, MD  DATE OF DISCHARGE: 01/12/2020  PRIMARY CARE PHYSICIAN: [facility], [facility]    ADMISSION DIAGNOSIS:  Arterial occlusion [I70.90] PAD (peripheral artery disease) (HCC) [I73.9]  DISCHARGE DIAGNOSIS:  Left LE pain due to PAD s/p angiogram with stent placement Tobacco abuse SECONDARY DIAGNOSIS:   Past Medical History:  Diagnosis Date  . Diabetes mellitus, type 2 (HCC) 12/17/2019  . IDA (iron deficiency anemia) 12/17/2019    HOSPITAL COURSE:   [name] a 39 y.o.femalewith medical history significant ofhypertension, diabetes mellitus, iron deficiency anemia, tobacco abuse, who presents with left leg pain. Patient states that she has been having left leg and left foot pain for almost 4 weeks.She was seen in ED on 6/25 and hadlower extremity venous Doppler, which was negative for DVT, but it showedocclusion of the distal femoropopliteal segment withunknown chronicity in left leg.  PAD (peripheral artery disease) (HCC)and lower limb ischemia_left leg: [facility]. -7/2>> Dr. Wyn Quaker is consulted -->s/p left LE angiogram *Percutaneous transluminal angioplasty of left posterior tibial artery and tibioperoneal trunk with 2-1/2 and 3 mm diameter angioplasty balloons * Percutaneous transluminal angioplasty of the left popliteal artery and SFA with 4 mm diameter Lutonix drug-coated angioplasty balloon *Mechanical thrombectomy with the penumbra cat 6 device to the left SFA, popliteal artery, tibioperoneal trunk, and posterior tibial arteries with the penumbra cat 6 device * Viabahn covered stent placement to the most distal SFA, popliteal artery, and tibioperoneal trunk with 6 mm diameter by 25 cm length stent for residual stenosis and thrombus -IV heparin started by ED--on IV  aggrastat--will discharge on po eliquis and ASA--Dr Myra Gianotti aware -As needed  Percocet for pain -hold plavix -continue ASA  Diabetes mellitus without complication (HCC): noA1con record. -Patient is takingMetforminat home--resume from 7/4 -SSI -Check A1c--pending  IDA (iron deficiency anemia):Hemoglobin 10.9 -Continue iron supplement  HTN (hypertension) -IV hydralazine as needed -Continue to amlodipine and lisinopril  SevereTobacco abuse: -Did counseling about importance of quitting smoking,particularly in the setting of PVD and ischemia limb of left leg -Nicotine patch -pt not motivated for smoking cessation--smoking since age 81 years    DVT ppx:on po eliquis Code Status:Full code Family Communication: not done, no family member is at bed side.  Disposition Plan: discharge back to previous environment today Consults called:Dr. Wyn Quaker of [facility] Admission status: Med-surg bedas inpt  Status is: Inpatient Dispo: The patient is from:Home Anticipated d/c is OJ:JKKX Anticipated d/c date FG:HWEXH CONSULTS OBTAINED:  Treatment Team:  Annice Needy, MD  DRUG ALLERGIES:  No Known Allergies  DISCHARGE MEDICATIONS:   Allergies as of 01/12/2020   No Known Allergies     Medication List    STOP taking these medications   clopidogrel 75 MG tablet Commonly known as: PLAVIX     TAKE these medications   amLODipine 5 MG tablet Commonly known as: NORVASC Take 5 mg by mouth daily.   apixaban 5 MG Tabs tablet Commonly known as: ELIQUIS Take 1 tablet (5 mg total) by mouth 2 (two) times daily.   aspirin EC 81 MG tablet Take 1 tablet (81 mg total) by mouth daily. Swallow whole.   gabapentin 300 MG capsule Commonly known as: NEURONTIN Take 300 mg by mouth 3 (three) times daily.   Iron 325 (65 Fe) MG Tabs Take 1 tablet (325 mg  total) by mouth daily.   lisinopril 20 MG tablet Commonly known as: ZESTRIL Take 20 mg  by mouth daily.   metFORMIN 500 MG tablet Commonly known as: Glucophage Take 1 tablet (500 mg total) by mouth 2 (two) times daily with a meal. Notes to patient: Start taking from 01/13/2020   nicotine 21 mg/24hr patch Commonly known as: NICODERM CQ - dosed in mg/24 hours Place 1 patch (21 mg total) onto the skin daily.   ondansetron 4 MG disintegrating tablet Commonly known as: Zofran ODT Take 1 tablet (4 mg total) by mouth every 8 (eight) hours as needed for nausea or vomiting.   oxyCODONE-acetaminophen 10-325 MG tablet Commonly known as: Percocet Take 1 tablet by mouth every 6 (six) hours as needed for pain.       If you experience worsening of your admission symptoms, develop shortness of breath, life threatening emergency, suicidal or homicidal thoughts you must seek medical attention immediately by calling 911 or calling your MD immediately  if symptoms less severe.  You Must read complete instructions/literature along with all the possible adverse reactions/side effects for all the Medicines you take and that have been prescribed to you. Take any new Medicines after you have completely understood and accept all the possible adverse reactions/side effects.   Please note  You were cared for by a hospitalist during your hospital stay. If you have any questions about your discharge medications or the care you received while you were in the hospital after you are discharged, you can call the unit and asked to speak with the hospitalist on call if the hospitalist that took care of you is not available. Once you are discharged, your primary care physician will handle any further medical issues. Please note that NO REFILLS for any discharge medications will be authorized once you are discharged, as it is imperative that you return to your primary care physician (or establish a relationship with a primary care physician if you do not have one) for your aftercare needs so that they can reassess  your need for medications and monitor your lab values. Today   SUBJECTIVE   No new complaints some left leg pain  VITAL SIGNS:  Blood pressure (!) 151/85, pulse (!) 104, temperature 99 F (37.2 C), temperature source Oral, resp. rate 20, height 5\' 5"  (1.651 m), weight 81.6 kg, SpO2 99 %.  I/O:    Intake/Output Summary (Last 24 hours) at 01/12/2020 0925 Last data filed at 01/12/2020 0503 Gross per 24 hour  Intake 548.26 ml  Output 1300 ml  Net -751.74 ml    PHYSICAL EXAMINATION:  GENERAL:  39 y.o.-year-old patient lying in the bed with no acute distress.  EYES: Pupils equal, round, reactive to light and accommodation. No scleral icterus.  HEENT: Head atraumatic, normocephalic. Oropharynx and nasopharynx clear.  NECK:  Supple, no jugular venous distention. No thyroid enlargement, no tenderness.  LUNGS: Normal breath sounds bilaterally, no wheezing, rales,rhonchi or crepitation. No use of accessory muscles of respiration.  CARDIOVASCULAR: S1, S2 normal. No murmurs, rubs, or gallops.  ABDOMEN: Soft, non-tender, non-distended. Bowel sounds present. No organomegaly or mass.  EXTREMITIES: No pedal edema, cyanosis, or clubbing. Skin left leg ok NEUROLOGIC: Cranial nerves II through XII are intact. Muscle strength 5/5 in all extremities. Sensation intact. Gait not checked.  PSYCHIATRIC: The patient is alert and oriented x 3.  SKIN: No obvious rash, lesion, or ulcer.   DATA REVIEW:   CBC  Recent Labs  Lab 01/12/20 [id number]  WBC 6.9  HGB 7.2*  HCT 25.6*  PLT 434*    Chemistries  Recent Labs  Lab 01/11/20 0456  NA 139  K 3.9  CL 108  CO2 24  GLUCOSE 118*  BUN 9  CREATININE 0.57  CALCIUM 8.5*    Microbiology Results   Recent Results (from the past 240 hour(s))  SARS Coronavirus 2 by RT PCR (hospital order, performed in [facility] hospital lab) Nasopharyngeal Nasopharyngeal Swab     Status: None   Collection Time: 01/10/20  4:51 PM   Specimen: Nasopharyngeal Swab  Result  Value Ref Range Status   SARS Coronavirus 2 NEGATIVE NEGATIVE Final    Comment: (NOTE) SARS-CoV-2 target nucleic acids are NOT DETECTED.  The SARS-CoV-2 RNA is generally detectable in upper and lower respiratory specimens during the acute phase of infection. The lowest concentration of SARS-CoV-2 viral copies this assay can detect is 250 copies / mL. A negative result does not preclude SARS-CoV-2 infection and should not be used as the sole basis for treatment or other patient management decisions.  A negative result may occur with improper specimen collection / handling, submission of specimen other than nasopharyngeal swab, presence of viral mutation(s) within the areas targeted by this assay, and inadequate number of viral copies (<250 copies / mL). A negative result must be combined with clinical observations, patient history, and epidemiological information.  Fact Sheet for Patients:   BoilerBrush.com.cy  Fact Sheet for Healthcare Providers: https://pope.com/  This test is not yet approved or  cleared by the [place] FDA and has been authorized for detection and/or diagnosis of SARS-CoV-2 by FDA under an Emergency Use Authorization (EUA).  This EUA will remain in effect (meaning this test can be used) for the duration of the COVID-19 declaration under Section 564(b)(1) of the Act, 21 U.S.C. section 360bbb-3(b)(1), unless the authorization is terminated or revoked sooner.  Performed at [facility], [address] [place], [place] [address]     RADIOLOGY:  PERIPHERAL VASCULAR CATHETERIZATION  Result Date: 01/11/2020 See op note    CODE STATUS:     Code Status Orders  (From admission, onward)         Start     Ordered   01/10/20 1707  Full code  Continuous        01/10/20 1706        Code Status History    This patient has a current code status but no historical code status.   Advance Care  Planning Activity       TOTAL TIME TAKING CARE OF THIS PATIENT: 40** minutes.    Enedina Finner M.D  [facility]    CC: Primary care physician; [facility], [facility]

## 2020-01-12 NOTE — Progress Notes (Signed)
Jocelyn [name] to be D/C'd home per MD order.  Discussed prescriptions and follow up appointments with the patient. Prescriptions given to patient, medication list explained in detail. Pt verbalized understanding.  Allergies as of 01/12/2020   No Known Allergies      Medication List     STOP taking these medications    clopidogrel 75 MG tablet Commonly known as: PLAVIX       TAKE these medications    amLODipine 5 MG tablet Commonly known as: NORVASC Take 5 mg by mouth daily.   apixaban 5 MG Tabs tablet Commonly known as: ELIQUIS Take 1 tablet (5 mg total) by mouth 2 (two) times daily.   aspirin EC 81 MG tablet Take 1 tablet (81 mg total) by mouth daily. Swallow whole.   gabapentin 300 MG capsule Commonly known as: NEURONTIN Take 300 mg by mouth 3 (three) times daily.   Iron 325 (65 Fe) MG Tabs Take 1 tablet (325 mg total) by mouth daily.   lisinopril 20 MG tablet Commonly known as: ZESTRIL Take 20 mg by mouth daily.   metFORMIN 500 MG tablet Commonly known as: Glucophage Take 1 tablet (500 mg total) by mouth 2 (two) times daily with a meal. Notes to patient: Start taking from 01/13/2020   nicotine 21 mg/24hr patch Commonly known as: NICODERM CQ - dosed in mg/24 hours Place 1 patch (21 mg total) onto the skin daily.   ondansetron 4 MG disintegrating tablet Commonly known as: Zofran ODT Take 1 tablet (4 mg total) by mouth every 8 (eight) hours as needed for nausea or vomiting.   oxyCODONE-acetaminophen 10-325 MG tablet Commonly known as: Percocet Take 1 tablet by mouth every 6 (six) hours as needed for pain.        Vitals:   01/11/20 2148 01/12/20 0449  BP: (!) 150/95 (!) 151/85  Pulse: (!) 102 (!) 104  Resp:  20  Temp:  99 F (37.2 C)  SpO2:  99%    Skin clean, dry and intact without evidence of skin break down, no evidence of skin tears noted. IV catheter discontinued intact. Site without signs and symptoms of complications. Dressing and pressure  applied. Pt denies pain at this time. No complaints noted.  An After Visit Summary was printed and given to the patient. Patient escorted via WC, and D/C home via private auto.  Julies Carmickle A Tenzin Pavon

## 2020-01-12 NOTE — Progress Notes (Signed)
    Subjective  - POD #1  Yesterday the patient underwent angiography with mechanical thrombectomy of the superficial femoral and popliteal artery followed by covered stent placement.  She has inline flow through the posterior tibial artery.  She still complains of discomfort and swelling in her left leg.   Physical Exam:  Left leg is warm to touch.  She still has mild amount of edema. Right groin cannulation site is soft.       Assessment/Plan:  POD #1  The patient is scheduled for discharge today.  Her Aggrastat dosing has completed.  She will be discharged home on Eliquis and aspirin with follow-up in the office later this month.  Wells Macdonald Rigor 01/12/2020 3:13 PM --  Vitals:   01/11/20 2148 01/12/20 0449  BP: (!) 150/95 (!) 151/85  Pulse: (!) 102 (!) 104  Resp:  20  Temp:  99 F (37.2 C)  SpO2:  99%    Intake/Output Summary (Last 24 hours) at 01/12/2020 1513 Last data filed at 01/12/2020 1300 Gross per 24 hour  Intake 788.26 ml  Output 700 ml  Net 88.26 ml     Laboratory CBC    Component Value Date/Time   WBC 6.9 01/12/2020 0608   HGB 7.2 (L) 01/12/2020 0608   HGB 11.7 (L) 04/24/2013 1557   HCT 25.6 (L) 01/12/2020 0608   HCT 35.6 04/24/2013 1557   PLT 434 (H) 01/12/2020 0608   PLT 316 04/24/2013 1557    BMET    Component Value Date/Time   NA 139 01/11/2020 0456   NA 134 (L) 04/24/2013 1557   K 3.9 01/11/2020 0456   K 4.4 04/24/2013 1557   CL 108 01/11/2020 0456   CL 105 04/24/2013 1557   CO2 24 01/11/2020 0456   CO2 24 04/24/2013 1557   GLUCOSE 118 (H) 01/11/2020 0456   GLUCOSE 103 (H) 04/24/2013 1557   BUN 9 01/11/2020 0456   BUN 7 04/24/2013 1557   CREATININE 0.57 01/11/2020 0456   CREATININE 0.51 (L) 04/24/2013 1557   CALCIUM 8.5 (L) 01/11/2020 0456   CALCIUM 9.1 04/24/2013 1557   GFRNONAA >60 01/11/2020 0456   GFRNONAA >60 04/24/2013 1557   GFRAA >60 01/11/2020 0456   GFRAA >60 04/24/2013 1557    COAG Lab Results  Component Value  Date   INR 1.0 01/10/2020   No results found for: PTT  Antibiotics Anti-infectives (From admission, onward)   Start     Dose/Rate Route Frequency Ordered Stop   01/12/20 0000  ceFAZolin (ANCEF) IVPB 2g/100 mL premix  Status:  Discontinued       Note to Pharmacy: To be given in specials   2 g 200 mL/hr over 30 Minutes Intravenous  Once 01/11/20 1038 01/11/20 1641   01/11/20 1354  ceFAZolin (ANCEF) 2-4 GM/100ML-% IVPB       Note to Pharmacy: Orlinda Blalock   : cabinet override      01/11/20 1354 01/12/20 0159       V. Charlena Cross, M.D., [facility] [place] Office: [phone] Pager:  [phone]

## 2020-01-15 ENCOUNTER — Encounter: Payer: Self-pay | Admitting: Vascular Surgery

## 2020-01-15 LAB — HEMOGLOBIN A1C
Hgb A1c MFr Bld: 4.6 % — ABNORMAL LOW (ref 4.8–5.6)
Mean Plasma Glucose: 85 mg/dL

## 2020-01-17 ENCOUNTER — Ambulatory Visit (INDEPENDENT_AMBULATORY_CARE_PROVIDER_SITE_OTHER): Payer: Self-pay | Admitting: Nurse Practitioner

## 2020-01-17 ENCOUNTER — Telehealth (INDEPENDENT_AMBULATORY_CARE_PROVIDER_SITE_OTHER): Payer: Self-pay | Admitting: Vascular Surgery

## 2020-01-17 ENCOUNTER — Other Ambulatory Visit (INDEPENDENT_AMBULATORY_CARE_PROVIDER_SITE_OTHER): Payer: Self-pay | Admitting: Vascular Surgery

## 2020-01-17 ENCOUNTER — Encounter (INDEPENDENT_AMBULATORY_CARE_PROVIDER_SITE_OTHER): Payer: Self-pay | Admitting: Vascular Surgery

## 2020-01-17 ENCOUNTER — Other Ambulatory Visit: Payer: Self-pay

## 2020-01-17 VITALS — BP 151/103 | HR 116 | Ht 65.0 in | Wt 189.0 lb

## 2020-01-17 DIAGNOSIS — Z95820 Peripheral vascular angioplasty status with implants and grafts: Secondary | ICD-10-CM

## 2020-01-17 DIAGNOSIS — S91109A Unspecified open wound of unspecified toe(s) without damage to nail, initial encounter: Secondary | ICD-10-CM

## 2020-01-17 DIAGNOSIS — I70222 Atherosclerosis of native arteries of extremities with rest pain, left leg: Secondary | ICD-10-CM

## 2020-01-17 DIAGNOSIS — I739 Peripheral vascular disease, unspecified: Secondary | ICD-10-CM

## 2020-01-17 NOTE — Progress Notes (Signed)
Subjective:    Patient ID: Jocelyn [name], female    DOB: 1981/03/08, 39 y.o.   MRN: [id number] Chief Complaint  Patient presents with  . New Patient (Initial Visit)    add on per GS    Patient presents today after revascularization on 01/11/2020.  The patient notes that after returning home on Tuesday the patient had a blister on her foot that subsequently popped while she was in the shower.  The patient began draining purulent drainage.  The patient went to her PCP yesterday who prescribed her antibiotics.  She notes that since she started the antibiotics her wound has been getting better.  However she still continues to have consistent drainage.  The area is tender and painful.  Patient also has some swelling throughout her lower extremity following revascularization.  Patient denies any rest pain like symptoms or severe claudication however.  She denies any fever, chills, nausea, vomiting or diarrhea.   Review of Systems  Cardiovascular: Positive for leg swelling.  Skin: Positive for wound.  All other systems reviewed and are negative.      Objective:   Physical Exam Vitals reviewed.  HENT:     Head: Normocephalic.  Musculoskeletal:     Right lower leg: Edema present.     Left lower leg: Edema present.       Feet:  Neurological:     Mental Status: She is alert and oriented to person, place, and time.  Psychiatric:        Mood and Affect: Mood normal.        Behavior: Behavior normal.        Thought Content: Thought content normal.        Judgment: Judgment normal.     BP (!) 151/103   Pulse (!) 116   Ht 5\' 5"  (1.651 m)   Wt 189 lb (85.7 kg)   BMI 31.45 kg/m   Past Medical History:  Diagnosis Date  . Diabetes mellitus, type 2 (HCC) 12/17/2019  . IDA (iron deficiency anemia) 12/17/2019    Social History   Socioeconomic History  . Marital status: Legally Separated    Spouse name: Not on file  . Number of children: Not on file  . Years of education: Not on  file  . Highest education level: Not on file  Occupational History  . Not on file  Tobacco Use  . Smoking status: Current Every Day Smoker    Packs/day: 0.50    Types: Cigarettes  . Smokeless tobacco: Never Used  Vaping Use  . Vaping Use: Never used  Substance and Sexual Activity  . Alcohol use: Yes  . Drug use: Never  . Sexual activity: Not on file  Other Topics Concern  . Not on file  Social History Narrative  . Not on file   Social Determinants of Health   Financial Resource Strain:   . Difficulty of Paying Living Expenses:   Food Insecurity:   . Worried About 02/16/2020 in the Last Year:   . Programme researcher, broadcasting/film/video in the Last Year:   Transportation Needs:   . Barista (Medical):   Freight forwarder Lack of Transportation (Non-Medical):   Physical Activity:   . Days of Exercise per Week:   . Minutes of Exercise per Session:   Stress:   . Feeling of Stress :   Social Connections:   . Frequency of Communication with Friends and Family:   . Frequency of Social Gatherings with  Friends and Family:   . Attends Religious Services:   . Active Member of Clubs or Organizations:   . Attends Banker Meetings:   Marland Kitchen Marital Status:   Intimate Partner Violence:   . Fear of Current or Ex-Partner:   . Emotionally Abused:   Marland Kitchen Physically Abused:   . Sexually Abused:     Past Surgical History:  Procedure Laterality Date  . APPENDECTOMY    . LOWER EXTREMITY ANGIOGRAPHY Left 01/11/2020   Procedure: Lower Extremity Angiography;  Surgeon: Annice Needy, MD;  Location: [facility];  Service: Cardiovascular;  Laterality: Left;    Family History  Problem Relation Age of Onset  . Hypertension Mother   . Hypertension Sister   . Hypertension Brother     No Known Allergies     Assessment & Plan:   1. PAD (peripheral artery disease) (HCC) Patient recently underwent extensive intervention.  At her follow-up visit we will also check ABIs to ensure that  everything is still patent, especially with her continued tobacco usage.  As this wound could potentially result in a limb threatening situation if her revascularization fails.  2. Wound, open, toe, initial encounter She has a blister formed between first and second toe of the left lower extremity.  I suspect this may be due to extensive swelling following revascularization.  Advised patient to keep wound clean and apply triple antibiotic ointment.  Patient does not wish to put on topical dressing because she states that it irritates the wound.  Patient should also continue with antibiotics prescribed by PCP this morning.  Patient will follow up in 1 week to evaluate wound progression.   Current Outpatient Medications on File Prior to Visit  Medication Sig Dispense Refill  . amLODipine (NORVASC) 5 MG tablet Take 5 mg by mouth daily.    Marland Kitchen apixaban (ELIQUIS) 5 MG TABS tablet Take 1 tablet (5 mg total) by mouth 2 (two) times daily. 60 tablet 2  . aspirin EC 81 MG tablet Take 1 tablet (81 mg total) by mouth daily. Swallow whole. 90 tablet 3  . BACTRIM DS 800-160 MG tablet Take 1 tablet by mouth 2 (two) times daily.    . Ferrous Sulfate (IRON) 325 (65 Fe) MG TABS Take 1 tablet (325 mg total) by mouth daily. 60 tablet 0  . gabapentin (NEURONTIN) 300 MG capsule Take 300 mg by mouth 3 (three) times daily.    Marland Kitchen lisinopril (ZESTRIL) 20 MG tablet Take 20 mg by mouth daily.    . metFORMIN (GLUCOPHAGE) 500 MG tablet Take 1 tablet (500 mg total) by mouth 2 (two) times daily with a meal. 60 tablet 0  . nicotine (NICODERM CQ - DOSED IN MG/24 HOURS) 21 mg/24hr patch Place 1 patch (21 mg total) onto the skin daily. 28 patch 0  . ondansetron (ZOFRAN ODT) 4 MG disintegrating tablet Take 1 tablet (4 mg total) by mouth every 8 (eight) hours as needed for nausea or vomiting. 20 tablet 0  . oxyCODONE-acetaminophen (PERCOCET) 10-325 MG tablet Take 1 tablet by mouth every 6 (six) hours as needed for pain. 20 tablet 0   No  current facility-administered medications on file prior to visit.    There are no Patient Instructions on file for this visit. No follow-ups on file.   Georgiana Spinner, NP

## 2020-01-23 ENCOUNTER — Other Ambulatory Visit: Payer: Self-pay

## 2020-01-23 ENCOUNTER — Encounter (INDEPENDENT_AMBULATORY_CARE_PROVIDER_SITE_OTHER): Payer: Self-pay | Admitting: Nurse Practitioner

## 2020-01-23 ENCOUNTER — Ambulatory Visit
Admission: RE | Admit: 2020-01-23 | Discharge: 2020-01-23 | Disposition: A | Discharge status: Home / Self Care | Payer: Self-pay | Attending: Nurse Practitioner | Admitting: Nurse Practitioner

## 2020-01-23 ENCOUNTER — Other Ambulatory Visit (INDEPENDENT_AMBULATORY_CARE_PROVIDER_SITE_OTHER): Payer: Self-pay | Admitting: Nurse Practitioner

## 2020-01-23 ENCOUNTER — Ambulatory Visit (INDEPENDENT_AMBULATORY_CARE_PROVIDER_SITE_OTHER): Payer: Self-pay | Admitting: Nurse Practitioner

## 2020-01-23 ENCOUNTER — Ambulatory Visit
Admission: RE | Admit: 2020-01-23 | Discharge: 2020-01-23 | Disposition: A | Discharge status: Home / Self Care | Payer: Self-pay | Source: Ambulatory Visit | Attending: Nurse Practitioner | Admitting: Nurse Practitioner

## 2020-01-23 ENCOUNTER — Ambulatory Visit (INDEPENDENT_AMBULATORY_CARE_PROVIDER_SITE_OTHER): Payer: Self-pay

## 2020-01-23 VITALS — BP 121/76 | HR 112 | Ht 65.0 in | Wt 181.0 lb

## 2020-01-23 DIAGNOSIS — Z95820 Peripheral vascular angioplasty status with implants and grafts: Secondary | ICD-10-CM

## 2020-01-23 DIAGNOSIS — F17200 Nicotine dependence, unspecified, uncomplicated: Secondary | ICD-10-CM

## 2020-01-23 DIAGNOSIS — I10 Essential (primary) hypertension: Secondary | ICD-10-CM

## 2020-01-23 DIAGNOSIS — T148XXA Other injury of unspecified body region, initial encounter: Secondary | ICD-10-CM

## 2020-01-23 DIAGNOSIS — L089 Local infection of the skin and subcutaneous tissue, unspecified: Secondary | ICD-10-CM

## 2020-01-23 DIAGNOSIS — I70222 Atherosclerosis of native arteries of extremities with rest pain, left leg: Secondary | ICD-10-CM

## 2020-01-23 DIAGNOSIS — E119 Type 2 diabetes mellitus without complications: Secondary | ICD-10-CM

## 2020-01-23 DIAGNOSIS — I998 Other disorder of circulatory system: Secondary | ICD-10-CM

## 2020-01-23 DIAGNOSIS — Z8619 Personal history of other infectious and parasitic diseases: Secondary | ICD-10-CM | POA: Insufficient documentation

## 2020-01-23 MED ORDER — AMOXICILLIN-POT CLAVULANATE 875-125 MG PO TABS: 1.0000 | ORAL_TABLET | Freq: Two times a day (BID) | ORAL | 0 refills | Status: DC

## 2020-01-23 MED ORDER — OXYCODONE-ACETAMINOPHEN 10-325 MG PO TABS: 1.0000 | ORAL_TABLET | Freq: Three times a day (TID) | ORAL | 0 refills | Status: DC | PRN

## 2020-01-23 NOTE — Progress Notes (Addendum)
Subjective:    Patient ID: Jocelyn [name], female    DOB: 1980/11/19, 39 y.o.   MRN: [id number] Chief Complaint  Patient presents with   Follow-up    U/S follow up    The patient presents today for follow-up regarding a wound on her left lower extremity.  Initially the patient underwent angiogram on 01/11/2020 due to an ischemic left lower extremity.  The patient had been presenting to [facility] emergency department, weekly for 3 weeks until finally it was discovered that her leg was ischemic.  The patient underwent extensive intervention including:  Procedure(s) Performed: 1. Ultrasound guidance for vascular access right femoral artery 2. Catheter placement into left common femoral artery from right femoral approach 3. Aortogram and selective left lower extremity angiogram including selective image of the posterior tibial artery 4. Percutaneous transluminal angioplasty of left posterior tibial artery and tibioperoneal trunk with 2-1/2 and 3 mm diameter angioplasty balloons 5.  Percutaneous transluminal angioplasty of the left popliteal artery and SFA with 4 mm diameter Lutonix drug-coated angioplasty balloon             6.  Mechanical thrombectomy with the penumbra cat 6 device to the left SFA, popliteal artery, tibioperoneal trunk, and posterior tibial arteries with the penumbra cat 6 device             7.  Viabahn covered stent placement to the most distal SFA, popliteal artery, and tibioperoneal trunk with 6 mm diameter by 25 cm length stent for residual stenosis and thrombus after above procedures 8. StarClose closure device right femoral artery  The patient has been compliant with her Eliquis since her procedure.  However approximately 4 days after her angiogram the patient had a wound develop between her first and second toes.  The patient notes that it started as a blister and  subsequently ruptured.  At that time the patient went to her primary care physician and was prescribed Bactrim.  The patient had copious drainage from the wound at that time.  Upon initial assessment in our office the wound appeared to be a superficial wound.  However, today there is concern that there are possible necrotic changes.  Please see attached picture for wound.  Today noninvasive studies were also done.  The patient has a right ABI 0.87 a left ABI of 1.10.  The patient has triphasic tibial artery waveforms of the left lower extremity with biphasic tibial artery waveforms in the right lower extremity      Review of Systems  Cardiovascular: Positive for leg swelling.  Skin: Positive for wound.  All other systems reviewed and are negative.      Objective:   Physical Exam Vitals reviewed.  HENT:     Head: Normocephalic.  Cardiovascular:     Rate and Rhythm: Normal rate and regular rhythm.     Pulses: Normal pulses.     Heart sounds: Normal heart sounds.  Pulmonary:     Effort: Pulmonary effort is normal.     Breath sounds: Normal breath sounds.  Musculoskeletal:     Left lower leg: Edema present.  Skin:      Neurological:     Mental Status: She is alert and oriented to person, place, and time.  Psychiatric:        Mood and Affect: Mood normal.        Behavior: Behavior normal.        Thought Content: Thought content normal.  Judgment: Judgment normal.     BP 121/76    Pulse (!) 112    Ht 5\' 5"  (1.651 m)    Wt 181 lb (82.1 kg)    BMI 30.12 kg/m   Past Medical History:  Diagnosis Date   Diabetes mellitus, type 2 (HCC) 12/17/2019   IDA (iron deficiency anemia) 12/17/2019    Social History   Socioeconomic History   Marital status: Legally Separated    Spouse name: Not on file   Number of children: Not on file   Years of education: Not on file   Highest education level: Not on file  Occupational History   Not on file  Tobacco Use   Smoking  status: Current Every Day Smoker    Packs/day: 0.50    Types: Cigarettes   Smokeless tobacco: Never Used  Vaping Use   Vaping Use: Never used  Substance and Sexual Activity   Alcohol use: Yes   Drug use: Never   Sexual activity: Not on file  Other Topics Concern   Not on file  Social History Narrative   Not on file   Social Determinants of Health   Financial Resource Strain:    Difficulty of Paying Living Expenses:   Food Insecurity:    Worried About 02/16/2020 in the Last Year:    Programme researcher, broadcasting/film/video in the Last Year:   Transportation Needs:    Barista (Medical):    Lack of Transportation (Non-Medical):   Physical Activity:    Days of Exercise per Week:    Minutes of Exercise per Session:   Stress:    Feeling of Stress :   Social Connections:    Frequency of Communication with Friends and Family:    Frequency of Social Gatherings with Friends and Family:    Attends Religious Services:    Active Member of Clubs or Organizations:    Attends Freight forwarder:    Marital Status:   Intimate Partner Violence:    Fear of Current or Ex-Partner:    Emotionally Abused:    Physically Abused:    Sexually Abused:     Past Surgical History:  Procedure Laterality Date   APPENDECTOMY     LOWER EXTREMITY ANGIOGRAPHY Left 01/11/2020   Procedure: Lower Extremity Angiography;  Surgeon: 03/13/2020, MD;  Location: [facility] INV[facility];  Service: Cardiovascular;  Laterality: Left;    Family History  Problem Relation Age of Onset   Hypertension Mother    Hypertension Sister    Hypertension Brother     No Known Allergies     Assessment & Plan:   1. Lower limb ischemia_left leg Recommend:  The patient is status post successful angiogram with intervention.  The patient reports that the claudication symptoms and leg pain is essentially gone.   The patient denies lifestyle limiting changes at this point in  time.  No further invasive studies, angiography or surgery at this time The patient should continue walking and begin a more formal exercise program.  The patient should continue antiplatelet therapy and aggressive treatment of the lipid abnormalities  Smoking cessation was again discussed  The patient should continue wearing graduated compression socks 10-15 mmHg strength to control the mild edema.  Patient should undergo noninvasive studies as ordered.    2. Essential hypertension Continue antihypertensive medications as already ordered, these medications have been reviewed and there are no changes at this time.   3. Diabetes mellitus without complication (  HCC) Continue hypoglycemic medications as already ordered, these medications have been reviewed and there are no changes at this time.  Hgb A1C to be monitored as already arranged by primary service   4. Smoker Smoking cessation was discussed, 3-10 minutes spent on this topic specifically   5. Wound infection The patient's wound appears to have worsened since she was seen in office a week ago.  The patient has completed her Bactrim antibiotics.  I am concerned that the patient's wound may be necrotic or there may be an underlying abscess.  I will send the patient for a stat x-ray in order to evaluate if there is any evidence of abscess or concern for osteomyelitis. - DG Foot 2 Views Left; Future - amoxicillin-clavulanate (AUGMENTIN) 875-125 MG tablet; Take 1 tablet by mouth 2 (two) times daily.  Dispense: 28 tablet; Refill: 0   Current Outpatient Medications on File Prior to Visit  Medication Sig Dispense Refill   amLODipine (NORVASC) 5 MG tablet Take 5 mg by mouth daily.     apixaban (ELIQUIS) 5 MG TABS tablet Take 1 tablet (5 mg total) by mouth 2 (two) times daily. 60 tablet 2   aspirin EC 81 MG tablet Take 1 tablet (81 mg total) by mouth daily. Swallow whole. 90 tablet 3   Ferrous Sulfate (IRON) 325 (65 Fe) MG TABS  Take 1 tablet (325 mg total) by mouth daily. 60 tablet 0   gabapentin (NEURONTIN) 300 MG capsule Take 300 mg by mouth 3 (three) times daily.     lisinopril (ZESTRIL) 20 MG tablet Take 20 mg by mouth daily.     metFORMIN (GLUCOPHAGE) 500 MG tablet Take 1 tablet (500 mg total) by mouth 2 (two) times daily with a meal. 60 tablet 0   nicotine (NICODERM CQ - DOSED IN MG/24 HOURS) 21 mg/24hr patch Place 1 patch (21 mg total) onto the skin daily. 28 patch 0   ondansetron (ZOFRAN ODT) 4 MG disintegrating tablet Take 1 tablet (4 mg total) by mouth every 8 (eight) hours as needed for nausea or vomiting. 20 tablet 0   No current facility-administered medications on file prior to visit.    There are no Patient Instructions on file for this visit. No follow-ups on file.   Georgiana Spinner, NP

## 2020-01-26 LAB — AEROBIC CULTURE

## 2020-02-04 ENCOUNTER — Other Ambulatory Visit (INDEPENDENT_AMBULATORY_CARE_PROVIDER_SITE_OTHER): Payer: Self-pay | Admitting: Nurse Practitioner

## 2020-02-07 ENCOUNTER — Other Ambulatory Visit: Payer: Self-pay

## 2020-02-07 ENCOUNTER — Ambulatory Visit (INDEPENDENT_AMBULATORY_CARE_PROVIDER_SITE_OTHER): Payer: Self-pay | Admitting: Nurse Practitioner

## 2020-02-07 ENCOUNTER — Encounter (INDEPENDENT_AMBULATORY_CARE_PROVIDER_SITE_OTHER): Payer: Self-pay | Admitting: Nurse Practitioner

## 2020-02-07 VITALS — BP 161/112 | HR 102 | Resp 18 | Ht 65.0 in | Wt 184.0 lb

## 2020-02-07 DIAGNOSIS — E119 Type 2 diabetes mellitus without complications: Secondary | ICD-10-CM

## 2020-02-07 DIAGNOSIS — I739 Peripheral vascular disease, unspecified: Secondary | ICD-10-CM

## 2020-02-07 DIAGNOSIS — I10 Essential (primary) hypertension: Secondary | ICD-10-CM

## 2020-02-07 DIAGNOSIS — S91109D Unspecified open wound of unspecified toe(s) without damage to nail, subsequent encounter: Secondary | ICD-10-CM

## 2020-02-07 MED ORDER — AMOXICILLIN-POT CLAVULANATE 875-125 MG PO TABS: 1.0000 | ORAL_TABLET | Freq: Two times a day (BID) | ORAL | 0 refills | Status: DC

## 2020-02-07 NOTE — Progress Notes (Signed)
Subjective:    Patient ID: Jocelyn [name], female    DOB: Jun 26, 1981, 39 y.o.   MRN: [id number] Chief Complaint  Patient presents with  . Follow-up    The patient presents today for follow-up regarding a wound on her left lower extremity.  Initially the patient underwent angiogram on 01/11/2020 due to an ischemic left lower extremity.  The patient had been presenting to [facility] emergency department, weekly for 3 weeks until finally it was discovered that her leg was ischemic.    The patient has been compliant with her Eliquis since her procedure.  However approximately 4 days after her angiogram the patient had a wound develop between her first and second toes.  The patient notes that it started as a blister and subsequently ruptured.  At that time the patient went to her primary care physician and was prescribed Bactrim.  The patient had copious drainage from the wound at that time.  Upon initial assessment in our office the wound appeared to be a superficial wound.  At the previous office visit, there was concerned concern that there were possible necrotic changes.  Today, the patient notes that the pain and drainage is stopped but there has not been any improvement in the appearance of the wound.  Please see attached picture for wound.  Has any fever, chills, nausea, vomiting or diarrhea.     Review of Systems  Cardiovascular: Positive for leg swelling.  Skin: Positive for wound.  All other systems reviewed and are negative.      Objective:   Physical Exam Vitals reviewed.  HENT:     Head: Normocephalic.  Cardiovascular:     Rate and Rhythm: Normal rate and regular rhythm.     Heart sounds: Normal heart sounds.  Pulmonary:     Effort: Pulmonary effort is normal.     Breath sounds: Normal breath sounds.  Skin:    General: Skin is warm and dry.  Neurological:     Mental Status: She is alert and oriented to person, place, and time.  Psychiatric:         Mood and Affect: Mood normal.        Behavior: Behavior normal.        Thought Content: Thought content normal.        Judgment: Judgment normal.             BP (!) 161/112 (BP Location: Right Arm)   Pulse 102   Resp 18   Ht 5\' 5"  (1.651 m)   Wt 184 lb (83.5 kg)   LMP 01/19/2020   BMI 30.62 kg/m   Past Medical History:  Diagnosis Date  . Diabetes mellitus, type 2 (HCC) 12/17/2019  . IDA (iron deficiency anemia) 12/17/2019    Social History   Socioeconomic History  . Marital status: Legally Separated    Spouse name: Not on file  . Number of children: Not on file  . Years of education: Not on file  . Highest education level: Not on file  Occupational History  . Not on file  Tobacco Use  . Smoking status: Current Every Day Smoker    Packs/day: 0.50    Types: Cigarettes  . Smokeless tobacco: Never Used  Vaping Use  . Vaping Use: Never used  Substance and Sexual Activity  . Alcohol use: Yes  . Drug use: Never  . Sexual activity: Not on file  Other Topics Concern  . Not on file  Social History  Narrative  . Not on file   Social Determinants of Health   Financial Resource Strain:   . Difficulty of Paying Living Expenses:   Food Insecurity:   . Worried About Programme researcher, broadcasting/film/video in the Last Year:   . Barista in the Last Year:   Transportation Needs:   . Freight forwarder (Medical):   Marland Kitchen Lack of Transportation (Non-Medical):   Physical Activity:   . Days of Exercise per Week:   . Minutes of Exercise per Session:   Stress:   . Feeling of Stress :   Social Connections:   . Frequency of Communication with Friends and Family:   . Frequency of Social Gatherings with Friends and Family:   . Attends Religious Services:   . Active Member of Clubs or Organizations:   . Attends Banker Meetings:   Marland Kitchen Marital Status:   Intimate Partner Violence:   . Fear of Current or Ex-Partner:   . Emotionally Abused:   Marland Kitchen Physically Abused:   .  Sexually Abused:     Past Surgical History:  Procedure Laterality Date  . APPENDECTOMY    . LOWER EXTREMITY ANGIOGRAPHY Left 01/11/2020   Procedure: Lower Extremity Angiography;  Surgeon: Annice Needy, MD;  Location: [facility] INV[facility];  Service: Cardiovascular;  Laterality: Left;    Family History  Problem Relation Age of Onset  . Hypertension Mother   . Hypertension Sister   . Hypertension Brother     No Known Allergies     Assessment & Plan:   1. PAD (peripheral artery disease) (HCC) Previous noninvasive studies indicate that the patient should have adequate perfusion for wound healing.  We will obtain noninvasive studies in 3 months in accordance with the standard follow-up schedule, however if there are any sudden changes we will obtain noninvasive studies sooner.  2. Diabetes mellitus without complication (HCC) Continue hypoglycemic medications as already ordered, these medications have been reviewed and there are no changes at this time.  Hgb A1C to be monitored as already arranged by primary service   3. Essential hypertension Continue antihypertensive medications as already ordered, these medications have been reviewed and there are no changes at this time.   4. Open toe wound, subsequent encounter The patient will continue her antibiotics that she has been tolerating them well.  We will also try to facilitate serial visit with podiatry.  Otherwise, patient return to the office in 2 weeks for wound evaluation. - amoxicillin-clavulanate (AUGMENTIN) 875-125 MG tablet; Take 1 tablet by mouth 2 (two) times daily.  Dispense: 28 tablet; Refill: 0   Current Outpatient Medications on File Prior to Visit  Medication Sig Dispense Refill  . amLODipine (NORVASC) 5 MG tablet Take 5 mg by mouth daily.    Marland Kitchen apixaban (ELIQUIS) 5 MG TABS tablet Take 1 tablet (5 mg total) by mouth 2 (two) times daily. 60 tablet 2  . aspirin EC 81 MG tablet Take 1 tablet (81 mg total) by mouth  daily. Swallow whole. 90 tablet 3  . Ferrous Sulfate (IRON) 325 (65 Fe) MG TABS Take 1 tablet (325 mg total) by mouth daily. 60 tablet 0  . gabapentin (NEURONTIN) 300 MG capsule Take 300 mg by mouth 3 (three) times daily.    Marland Kitchen lisinopril (ZESTRIL) 20 MG tablet Take 20 mg by mouth daily.    . metFORMIN (GLUCOPHAGE) 500 MG tablet Take 1 tablet (500 mg total) by mouth 2 (two) times daily with a meal.  60 tablet 0  . nicotine (NICODERM CQ - DOSED IN MG/24 HOURS) 21 mg/24hr patch Place 1 patch (21 mg total) onto the skin daily. 28 patch 0  . ondansetron (ZOFRAN ODT) 4 MG disintegrating tablet Take 1 tablet (4 mg total) by mouth every 8 (eight) hours as needed for nausea or vomiting. 20 tablet 0  . oxyCODONE-acetaminophen (PERCOCET) 10-325 MG tablet Take 1 tablet by mouth every 8 (eight) hours as needed for pain. 30 tablet 0   No current facility-administered medications on file prior to visit.    There are no Patient Instructions on file for this visit. No follow-ups on file.   Georgiana Spinner, NP

## 2020-02-21 ENCOUNTER — Ambulatory Visit (INDEPENDENT_AMBULATORY_CARE_PROVIDER_SITE_OTHER): Payer: Self-pay | Admitting: Nurse Practitioner

## 2020-02-29 ENCOUNTER — Encounter (INDEPENDENT_AMBULATORY_CARE_PROVIDER_SITE_OTHER): Payer: Self-pay | Admitting: Nurse Practitioner

## 2020-03-06 ENCOUNTER — Encounter: Payer: Self-pay | Admitting: Emergency Medicine

## 2020-03-06 ENCOUNTER — Encounter
Admission: EM | Disposition: A | Discharge status: Home / Self Care | Payer: Self-pay | Source: Home / Self Care | Attending: Internal Medicine

## 2020-03-06 ENCOUNTER — Other Ambulatory Visit (INDEPENDENT_AMBULATORY_CARE_PROVIDER_SITE_OTHER): Payer: Self-pay | Admitting: Vascular Surgery

## 2020-03-06 ENCOUNTER — Inpatient Hospital Stay
Admission: EM | Admit: 2020-03-06 | Discharge: 2020-03-09 | DRG: 272 | Disposition: A | Discharge status: Home / Self Care | Payer: Self-pay | Attending: Internal Medicine | Admitting: Internal Medicine

## 2020-03-06 ENCOUNTER — Other Ambulatory Visit: Payer: Self-pay

## 2020-03-06 DIAGNOSIS — L97529 Non-pressure chronic ulcer of other part of left foot with unspecified severity: Secondary | ICD-10-CM | POA: Diagnosis present

## 2020-03-06 DIAGNOSIS — F17210 Nicotine dependence, cigarettes, uncomplicated: Secondary | ICD-10-CM | POA: Diagnosis present

## 2020-03-06 DIAGNOSIS — E1140 Type 2 diabetes mellitus with diabetic neuropathy, unspecified: Secondary | ICD-10-CM | POA: Diagnosis present

## 2020-03-06 DIAGNOSIS — I998 Other disorder of circulatory system: Secondary | ICD-10-CM

## 2020-03-06 DIAGNOSIS — Z7982 Long term (current) use of aspirin: Secondary | ICD-10-CM

## 2020-03-06 DIAGNOSIS — Z9049 Acquired absence of other specified parts of digestive tract: Secondary | ICD-10-CM

## 2020-03-06 DIAGNOSIS — Z7984 Long term (current) use of oral hypoglycemic drugs: Secondary | ICD-10-CM

## 2020-03-06 DIAGNOSIS — Z7901 Long term (current) use of anticoagulants: Secondary | ICD-10-CM

## 2020-03-06 DIAGNOSIS — I70263 Atherosclerosis of native arteries of extremities with gangrene, bilateral legs: Principal | ICD-10-CM | POA: Diagnosis present

## 2020-03-06 DIAGNOSIS — Z79899 Other long term (current) drug therapy: Secondary | ICD-10-CM

## 2020-03-06 DIAGNOSIS — D509 Iron deficiency anemia, unspecified: Secondary | ICD-10-CM | POA: Diagnosis present

## 2020-03-06 DIAGNOSIS — Z20822 Contact with and (suspected) exposure to covid-19: Secondary | ICD-10-CM | POA: Diagnosis present

## 2020-03-06 DIAGNOSIS — I70222 Atherosclerosis of native arteries of extremities with rest pain, left leg: Secondary | ICD-10-CM

## 2020-03-06 DIAGNOSIS — M79606 Pain in leg, unspecified: Secondary | ICD-10-CM | POA: Diagnosis present

## 2020-03-06 DIAGNOSIS — E1152 Type 2 diabetes mellitus with diabetic peripheral angiopathy with gangrene: Secondary | ICD-10-CM | POA: Diagnosis present

## 2020-03-06 DIAGNOSIS — L97509 Non-pressure chronic ulcer of other part of unspecified foot with unspecified severity: Secondary | ICD-10-CM

## 2020-03-06 DIAGNOSIS — E11621 Type 2 diabetes mellitus with foot ulcer: Secondary | ICD-10-CM | POA: Diagnosis present

## 2020-03-06 DIAGNOSIS — I10 Essential (primary) hypertension: Secondary | ICD-10-CM | POA: Diagnosis present

## 2020-03-06 HISTORY — PX: LOWER EXTREMITY ANGIOGRAPHY: CATH118251

## 2020-03-06 LAB — DIFFERENTIAL
Abs Immature Granulocytes: 0.01 10*3/uL (ref 0.00–0.07)
Basophils Absolute: 0 10*3/uL (ref 0.0–0.1)
Basophils Relative: 1 %
Eosinophils Absolute: 0.1 10*3/uL (ref 0.0–0.5)
Eosinophils Relative: 1 %
Immature Granulocytes: 0 %
Lymphocytes Relative: 22 %
Lymphs Abs: 1.4 10*3/uL (ref 0.7–4.0)
Monocytes Absolute: 0.5 10*3/uL (ref 0.1–1.0)
Monocytes Relative: 8 %
Neutro Abs: 4.6 10*3/uL (ref 1.7–7.7)
Neutrophils Relative %: 68 %

## 2020-03-06 LAB — TYPE AND SCREEN
ABO/RH(D): B POS
Antibody Screen: NEGATIVE

## 2020-03-06 LAB — POC URINE PREG, ED: Preg Test, Ur: NEGATIVE

## 2020-03-06 LAB — CBC
HCT: 39.3 % (ref 36.0–46.0)
HCT: 38.7 % (ref 36.0–46.0)
Hemoglobin: 13.1 g/dL (ref 12.0–15.0)
Hemoglobin: 12.9 g/dL (ref 12.0–15.0)
MCH: 29.8 pg (ref 26.0–34.0)
MCH: 30.1 pg (ref 26.0–34.0)
MCHC: 33.3 g/dL (ref 30.0–36.0)
MCHC: 33.3 g/dL (ref 30.0–36.0)
MCV: 89.3 fL (ref 80.0–100.0)
MCV: 90.4 fL (ref 80.0–100.0)
Platelets: 337 10*3/uL (ref 150–400)
Platelets: 316 10*3/uL (ref 150–400)
RBC: 4.4 MIL/uL (ref 3.87–5.11)
RBC: 4.28 MIL/uL (ref 3.87–5.11)
RDW: 16.3 % — ABNORMAL HIGH (ref 11.5–15.5)
RDW: 16.2 % — ABNORMAL HIGH (ref 11.5–15.5)
WBC: 7 10*3/uL (ref 4.0–10.5)
WBC: 6.6 10*3/uL (ref 4.0–10.5)
nRBC: 0 % (ref 0.0–0.2)
nRBC: 0 % (ref 0.0–0.2)

## 2020-03-06 LAB — COMPREHENSIVE METABOLIC PANEL
ALT: 42 U/L (ref 0–44)
AST: 40 U/L (ref 15–41)
Albumin: 3.9 g/dL (ref 3.5–5.0)
Alkaline Phosphatase: 84 U/L (ref 38–126)
Anion gap: 9 (ref 5–15)
BUN: 10 mg/dL (ref 6–20)
CO2: 26 mmol/L (ref 22–32)
Calcium: 9.6 mg/dL (ref 8.9–10.3)
Chloride: 105 mmol/L (ref 98–111)
Creatinine, Ser: 0.59 mg/dL (ref 0.44–1.00)
GFR calc Af Amer: >60 mL/min (ref >60)
GFR calc non Af Amer: >60 mL/min (ref >60)
Glucose, Bld: 95 mg/dL (ref 70–99)
Potassium: 4 mmol/L (ref 3.5–5.1)
Sodium: 140 mmol/L (ref 135–145)
Total Bilirubin: 0.7 mg/dL (ref 0.3–1.2)
Total Protein: 7.5 g/dL (ref 6.5–8.1)

## 2020-03-06 LAB — APTT: aPTT: 30 seconds (ref 24–36)

## 2020-03-06 LAB — PROTIME-INR
INR: 1 (ref 0.8–1.2)
Prothrombin Time: 12.9 seconds (ref 11.4–15.2)

## 2020-03-06 LAB — LACTIC ACID, PLASMA
Lactic Acid, Venous: 1 mmol/L (ref 0.5–1.9)
Lactic Acid, Venous: 1.5 mmol/L (ref 0.5–1.9)

## 2020-03-06 LAB — GLUCOSE, CAPILLARY: Glucose-Capillary: 144 mg/dL — ABNORMAL HIGH (ref 70–99)

## 2020-03-06 LAB — FIBRINOGEN: Fibrinogen: 386 mg/dL (ref 210–475)

## 2020-03-06 LAB — PROCALCITONIN: Procalcitonin: <0.1 ng/mL

## 2020-03-06 LAB — SARS CORONAVIRUS 2 BY RT PCR (HOSPITAL ORDER, PERFORMED IN ~~LOC~~ HOSPITAL LAB): SARS Coronavirus 2: NEGATIVE

## 2020-03-06 LAB — HEPARIN LEVEL (UNFRACTIONATED)
Heparin Unfractionated: 0.61 IU/mL (ref 0.30–0.70)
Heparin Unfractionated: 0.59 IU/mL (ref 0.30–0.70)

## 2020-03-06 SURGERY — LOWER EXTREMITY ANGIOGRAPHY
Anesthesia: Moderate Sedation | Laterality: Left

## 2020-03-06 MED ORDER — DIPHENHYDRAMINE HCL 50 MG/ML IJ SOLN: 50.0000 mg | Freq: Once | INTRAMUSCULAR | Status: DC | PRN

## 2020-03-06 MED ORDER — SODIUM CHLORIDE 0.9 % IV SOLN
0.5000 mg/h | INTRAVENOUS | Status: DC
  Filled 2020-03-06 (×2): qty 10

## 2020-03-06 MED ORDER — HEPARIN SODIUM (PORCINE) 1000 UNIT/ML IJ SOLN
INTRAMUSCULAR | Status: DC | PRN
  Administered 2020-03-06: 5000 [IU] via INTRAVENOUS

## 2020-03-06 MED ORDER — HYDROMORPHONE HCL 1 MG/ML IJ SOLN: 1.0000 mg | INTRAMUSCULAR | Status: DC | PRN

## 2020-03-06 MED ORDER — ALTEPLASE 2 MG IJ SOLR
INTRAMUSCULAR | Status: DC | PRN
  Administered 2020-03-06: 6 mg

## 2020-03-06 MED ORDER — HEPARIN SODIUM (PORCINE) 1000 UNIT/ML IJ SOLN
INTRAMUSCULAR | Status: AC
  Filled 2020-03-06: qty 1

## 2020-03-06 MED ORDER — MIDAZOLAM HCL 5 MG/5ML IJ SOLN
INTRAMUSCULAR | Status: AC
  Filled 2020-03-06: qty 5

## 2020-03-06 MED ORDER — IODIXANOL 320 MG/ML IV SOLN
INTRAVENOUS | Status: DC | PRN
  Administered 2020-03-06: 35 mL via INTRA_ARTERIAL

## 2020-03-06 MED ORDER — SODIUM CHLORIDE 0.9% FLUSH: 3.0000 mL | INTRAVENOUS | Status: DC | PRN

## 2020-03-06 MED ORDER — CEFAZOLIN SODIUM-DEXTROSE 2-4 GM/100ML-% IV SOLN
INTRAVENOUS | Status: AC
  Filled 2020-03-06: qty 100

## 2020-03-06 MED ORDER — FENTANYL CITRATE (PF) 100 MCG/2ML IJ SOLN
INTRAMUSCULAR | Status: AC
Start: 2020-03-06 — End: 2020-03-07
  Filled 2020-03-06: qty 2

## 2020-03-06 MED ORDER — METHYLPREDNISOLONE SODIUM SUCC 125 MG IJ SOLR: 125.0000 mg | Freq: Once | INTRAMUSCULAR | Status: DC | PRN

## 2020-03-06 MED ORDER — SODIUM CHLORIDE 0.9 % IV SOLN
1.0000 mg/h | INTRAVENOUS | Status: AC
  Administered 2020-03-06: 1 mg/h
  Filled 2020-03-06: qty 10

## 2020-03-06 MED ORDER — HYDRALAZINE HCL 20 MG/ML IJ SOLN
10.0000 mg | Freq: Four times a day (QID) | INTRAMUSCULAR | Status: DC | PRN
  Administered 2020-03-07 (×2): 10 mg via INTRAVENOUS
  Filled 2020-03-06 (×2): qty 1

## 2020-03-06 MED ORDER — HEPARIN BOLUS VIA INFUSION
4500.0000 [IU] | Freq: Once | INTRAVENOUS | Status: DC
  Filled 2020-03-06: qty 4500

## 2020-03-06 MED ORDER — SODIUM CHLORIDE 0.9 % IV SOLN: INTRAVENOUS | Status: DC

## 2020-03-06 MED ORDER — MIDAZOLAM HCL 2 MG/ML PO SYRP: 8.0000 mg | ORAL_SOLUTION | Freq: Once | ORAL | Status: DC | PRN

## 2020-03-06 MED ORDER — OXYCODONE HCL 5 MG PO TABS
ORAL_TABLET | ORAL | Status: AC
  Administered 2020-03-06: 10 mg via ORAL
  Filled 2020-03-06: qty 2

## 2020-03-06 MED ORDER — MORPHINE SULFATE (PF) 4 MG/ML IV SOLN
4.0000 mg | Freq: Once | INTRAVENOUS | Status: AC
  Administered 2020-03-06: 4 mg via INTRAVENOUS
  Filled 2020-03-06: qty 1

## 2020-03-06 MED ORDER — CEFAZOLIN SODIUM-DEXTROSE 2-4 GM/100ML-% IV SOLN
2.0000 g | Freq: Once | INTRAVENOUS | Status: AC
  Administered 2020-03-06: 2 g via INTRAVENOUS

## 2020-03-06 MED ORDER — HYDROMORPHONE HCL 1 MG/ML IJ SOLN: 1.0000 mg | Freq: Once | INTRAMUSCULAR | Status: DC | PRN

## 2020-03-06 MED ORDER — ONDANSETRON HCL 4 MG/2ML IJ SOLN
4.0000 mg | Freq: Once | INTRAMUSCULAR | Status: AC
  Administered 2020-03-06: 4 mg via INTRAVENOUS
  Filled 2020-03-06: qty 2

## 2020-03-06 MED ORDER — INSULIN ASPART 100 UNIT/ML ~~LOC~~ SOLN: 0.0000 [IU] | Freq: Every day | SUBCUTANEOUS | Status: DC

## 2020-03-06 MED ORDER — CHLORHEXIDINE GLUCONATE CLOTH 2 % EX PADS
6.0000 | MEDICATED_PAD | Freq: Every day | CUTANEOUS | Status: DC
  Administered 2020-03-07 – 2020-03-09 (×2): 6 via TOPICAL

## 2020-03-06 MED ORDER — POLYETHYLENE GLYCOL 3350 17 G PO PACK: 17.0000 g | PACK | Freq: Every day | ORAL | Status: DC | PRN

## 2020-03-06 MED ORDER — INSULIN ASPART 100 UNIT/ML ~~LOC~~ SOLN
0.0000 [IU] | Freq: Three times a day (TID) | SUBCUTANEOUS | Status: DC
  Filled 2020-03-06: qty 1

## 2020-03-06 MED ORDER — MIDAZOLAM HCL 2 MG/2ML IJ SOLN
INTRAMUSCULAR | Status: DC | PRN
  Administered 2020-03-06 (×2): 2 mg via INTRAVENOUS

## 2020-03-06 MED ORDER — HEPARIN (PORCINE) 25000 UT/250ML-% IV SOLN
700.0000 [IU]/h | INTRAVENOUS | Status: DC
  Administered 2020-03-06: 600 [IU]/h via INTRA_ARTERIAL

## 2020-03-06 MED ORDER — SODIUM CHLORIDE 0.9% FLUSH
3.0000 mL | Freq: Two times a day (BID) | INTRAVENOUS | Status: DC
  Administered 2020-03-07 – 2020-03-09 (×4): 3 mL via INTRAVENOUS

## 2020-03-06 MED ORDER — MORPHINE SULFATE (PF) 4 MG/ML IV SOLN
2.0000 mg | INTRAVENOUS | Status: DC | PRN
  Administered 2020-03-06 – 2020-03-07 (×4): 2 mg via INTRAVENOUS
  Filled 2020-03-06 (×4): qty 1

## 2020-03-06 MED ORDER — HEPARIN (PORCINE) 25000 UT/250ML-% IV SOLN: 1200.0000 [IU]/h | INTRAVENOUS | Status: DC

## 2020-03-06 MED ORDER — FENTANYL CITRATE (PF) 100 MCG/2ML IJ SOLN
INTRAMUSCULAR | Status: DC | PRN
Start: 2020-03-06 — End: 2020-03-06
  Administered 2020-03-06 (×2): 50 ug via INTRAVENOUS

## 2020-03-06 MED ORDER — ONDANSETRON HCL 4 MG/2ML IJ SOLN: 4.0000 mg | Freq: Four times a day (QID) | INTRAMUSCULAR | Status: DC | PRN

## 2020-03-06 MED ORDER — HYDROCODONE-ACETAMINOPHEN 5-325 MG PO TABS: 1.0000 | ORAL_TABLET | Freq: Four times a day (QID) | ORAL | Status: DC | PRN

## 2020-03-06 MED ORDER — DOCUSATE SODIUM 100 MG PO CAPS: 100.0000 mg | ORAL_CAPSULE | Freq: Two times a day (BID) | ORAL | Status: DC | PRN

## 2020-03-06 MED ORDER — FAMOTIDINE IN NACL 20-0.9 MG/50ML-% IV SOLN
20.0000 mg | Freq: Two times a day (BID) | INTRAVENOUS | Status: DC
  Administered 2020-03-06 – 2020-03-08 (×5): 20 mg via INTRAVENOUS
  Filled 2020-03-06 (×5): qty 50

## 2020-03-06 MED ORDER — SODIUM CHLORIDE 0.9 % IV SOLN: 250.0000 mL | INTRAVENOUS | Status: DC | PRN

## 2020-03-06 MED ORDER — ACETAMINOPHEN 325 MG PO TABS
650.0000 mg | ORAL_TABLET | ORAL | Status: DC | PRN
  Administered 2020-03-07: 650 mg via ORAL

## 2020-03-06 MED ORDER — HEPARIN (PORCINE) 25000 UT/250ML-% IV SOLN: 600.0000 [IU]/h | INTRAVENOUS | Status: DC

## 2020-03-06 MED ORDER — OXYCODONE HCL 5 MG PO TABS
5.0000 mg | ORAL_TABLET | ORAL | Status: DC | PRN
  Administered 2020-03-06 – 2020-03-09 (×4): 10 mg via ORAL
  Filled 2020-03-06 (×4): qty 2

## 2020-03-06 MED ORDER — HEPARIN (PORCINE) 25000 UT/250ML-% IV SOLN
INTRAVENOUS | Status: AC
  Filled 2020-03-06: qty 250

## 2020-03-06 MED ORDER — SODIUM CHLORIDE 0.9 % IV SOLN: INTRAVENOUS | Status: AC

## 2020-03-06 MED ORDER — SODIUM CHLORIDE 0.9% FLUSH: 3.0000 mL | Freq: Two times a day (BID) | INTRAVENOUS | Status: DC

## 2020-03-06 MED ORDER — FAMOTIDINE 20 MG PO TABS: 40.0000 mg | ORAL_TABLET | Freq: Once | ORAL | Status: DC | PRN

## 2020-03-06 SURGICAL SUPPLY — 13 items
CATH BEACON 5 .038 100 VERT TP (CATHETERS) ×3 IMPLANT
CATH INFUS 135CMX50CM (CATHETERS) ×3 IMPLANT
CATH PIG 70CM (CATHETERS) ×3 IMPLANT
COVER PROBE U/S 5X48 (MISCELLANEOUS) ×3 IMPLANT
GLIDEWIRE ADV .035X260CM (WIRE) ×3 IMPLANT
KIT CV MULTILUMEN 7FR 20 (SET/KITS/TRAYS/PACK) ×3
KIT CV MULTILUMEN 7FR 20 SUB (SET/KITS/TRAYS/PACK) ×1 IMPLANT
PACK ANGIOGRAPHY (CUSTOM PROCEDURE TRAY) ×3 IMPLANT
SHEATH BRITE TIP 5FRX11 (SHEATH) ×3 IMPLANT
SHEATH PINNACLE ST 6F 45CM (SHEATH) ×3 IMPLANT
SYR MEDRAD MARK 7 150ML (SYRINGE) ×3 IMPLANT
TUBING CONTRAST HIGH PRESS 72 (TUBING) ×3 IMPLANT
WIRE J 3MM .035X145CM (WIRE) ×3 IMPLANT

## 2020-03-06 NOTE — ED Notes (Signed)
See triage note   Presents with increased pain to left foot    States she had surgery to foot in July  States infection is better but now having increased pain

## 2020-03-06 NOTE — ED Provider Notes (Signed)
[facility] Emergency Department Provider Note  Time seen: 3:14 PM  I have reviewed the triage vital signs and the nursing notes.   HISTORY  Chief Complaint Foot Pain   HPI Jocelyn [name] is a 39 y.o. female with a past medical history of diabetes, PAD, hypertension, status post left lower extremity angiography with stents placed 01/11/2020 presents to the emergency department for increased left lower extremity/left foot pain.   According to the patient on 01/11/2020 patient had a procedure done to her left leg, per record review this was an angiography with stent placement due to PAD.  Patient states the next day she had developed some swelling to her foot and has been on antibiotics intermittently since.  Patient states over the last few days she has noticed some mild increase in swelling to her left first and second toes and then since last night she has been experiencing pain once again to her foot and toes consistent with how she felt prior to the surgery.  Denies any fever.  Largely negative review of systems otherwise.  Past Medical History:  Diagnosis Date  . Diabetes mellitus, type 2 (HCC) 12/17/2019  . IDA (iron deficiency anemia) 12/17/2019    Patient Active Problem List   Diagnosis Date Noted  . Lower limb ischemia_left leg 01/10/2020  . Diabetes mellitus without complication (HCC) 12/17/2019  . IDA (iron deficiency anemia) 12/17/2019  . PAD (peripheral artery disease) (HCC) 12/17/2019  . HTN (hypertension) 12/17/2019    Past Surgical History:  Procedure Laterality Date  . APPENDECTOMY    . LOWER EXTREMITY ANGIOGRAPHY Left 01/11/2020   Procedure: Lower Extremity Angiography;  Surgeon: Annice Needy, MD;  Location: [facility] INVASIVE CV LAB;  Service: Cardiovascular;  Laterality: Left;    Prior to Admission medications   Medication Sig Start Date End Date Taking? Authorizing Provider  amLODipine (NORVASC) 5 MG tablet Take 5 mg by mouth daily. 09/06/19    [provider]  amoxicillin-clavulanate (AUGMENTIN) 875-125 MG tablet Take 1 tablet by mouth 2 (two) times daily. 02/07/20   Georgiana Spinner, NP  apixaban (ELIQUIS) 5 MG TABS tablet Take 1 tablet (5 mg total) by mouth 2 (two) times daily. 01/12/20   Enedina Finner, MD  aspirin EC 81 MG tablet Take 1 tablet (81 mg total) by mouth daily. Swallow whole. 01/04/20 01/03/21  Stegmayer, Ranae Plumber, PA-C  Ferrous Sulfate (IRON) 325 (65 Fe) MG TABS Take 1 tablet (325 mg total) by mouth daily. 12/17/19 02/15/20  Sharman Cheek, MD  gabapentin (NEURONTIN) 300 MG capsule Take 300 mg by mouth 3 (three) times daily. 12/21/19   [provider]  lisinopril (ZESTRIL) 20 MG tablet Take 20 mg by mouth daily. 09/06/19   [provider]  metFORMIN (GLUCOPHAGE) 500 MG tablet Take 1 tablet (500 mg total) by mouth 2 (two) times daily with a meal. 12/17/19   Sharman Cheek, MD  nicotine (NICODERM CQ - DOSED IN MG/24 HOURS) 21 mg/24hr patch Place 1 patch (21 mg total) onto the skin daily. 01/12/20   Enedina Finner, MD    No Known Allergies  Family History  Problem Relation Age of Onset  . Hypertension Mother   . Hypertension Sister   . Hypertension Brother     Social History Social History   Tobacco Use  . Smoking status: Current Every Day Smoker    Packs/day: 0.50    Types: Cigarettes  . Smokeless tobacco: Never Used  Vaping Use  . Vaping Use: Never  used  Substance Use Topics  . Alcohol use: Yes  . Drug use: Never    Review of Systems Constitutional: Negative for fever Cardiovascular: Negative for chest pain. Respiratory: Negative for shortness of breath. Gastrointestinal: Negative for abdominal pain Musculoskeletal: She states mild swelling of her left first and second toes with pain in her foot and toes. Skin: Darkening of the first and second digits in her left foot. Neurological: Negative for headache All other ROS  negative  ____________________________________________   PHYSICAL EXAM:  VITAL SIGNS: ED Triage Vitals  Enc Vitals Group     BP 03/06/20 1441 (!) 172/125     Pulse Rate 03/06/20 1441 (!) 105     Resp 03/06/20 1441 20     Temp 03/06/20 1441 98.4 F (36.9 C)     Temp Source 03/06/20 1441 Oral     SpO2 03/06/20 1441 99 %     Weight 03/06/20 1440 180 lb (81.6 kg)     Height 03/06/20 1440 5\' 5"  (1.651 m)     Head Circumference --      Peak Flow --      Pain Score 03/06/20 1439 8     Pain Loc --      Pain Edu? --      Excl. in GC? --    Constitutional: Alert and oriented. Well appearing and in no distress. Eyes: Normal exam ENT      Head: Normocephalic and atraumatic.      Mouth/Throat: Mucous membranes are moist. Cardiovascular: Normal rate, regular rhythm. Respiratory: Normal respiratory effort without tachypnea nor retractions. Breath sounds are clear  Gastrointestinal: Soft and nontender. No distention.   Musculoskeletal: Patient has mild tenderness palpation of the left first and second toes of mild swelling to this area and some darkening of the skin.  Patient has tenderness to the dorsal aspect of the left foot as well but no appreciable edema to this area.  No palpable pulse, we will check with a Doppler. Neurologic:  Normal speech and language. No gross focal neurologic deficits  Skin: Skin is dry. Psychiatric: Mood and affect are normal.  ____________________________________________   INITIAL IMPRESSION / ASSESSMENT AND PLAN / ED COURSE  Pertinent labs & imaging results that were available during my care of the patient were reviewed by me and considered in my medical decision making (see chart for details).   Patient presents emergency department for left foot discomfort.  Per record review on 01/11/2020 patient had an angiography by Dr. 03/13/2020 with stents placed.  We will check labs, perform vascular Dopplers to ensure continued perfusion.  Patient agreeable to plan of  care.  Unable to Doppler a pulse in the left DP.  Very minimal left PT pulse on Doppler.  Much stronger pulses in the right foot on Doppler.  Given these findings I spoke with Dr. Wyn Quaker who is concerned over arterial reocclusion or rethrombosis.  We will start the patient on heparin infusion, we will admit to the hospitalist service.  Jocelyn [name] was evaluated in Emergency Department on 03/06/2020 for the symptoms described in the history of present illness. She was evaluated in the context of the global COVID-19 pandemic, which necessitated consideration that the patient might be at risk for infection with the SARS-CoV-2 virus that causes COVID-19. Institutional protocols and algorithms that pertain to the evaluation of patients at risk for COVID-19 are in a state of rapid change based on information released by regulatory bodies including the CDC and federal and  state organizations. These policies and algorithms were followed during the patient's care in the ED.  CRITICAL CARE Performed by: Minna Antis   Total critical care time: 30 minutes  Critical care time was exclusive of separately billable procedures and treating other patients.  Critical care was necessary to treat or prevent imminent or life-threatening deterioration.  Critical care was time spent personally by me on the following activities: development of treatment plan with patient and/or surrogate as well as nursing, discussions with consultants, evaluation of patient's response to treatment, examination of patient, obtaining history from patient or surrogate, ordering and performing treatments and interventions, ordering and review of laboratory studies, ordering and review of radiographic studies, pulse oximetry and re-evaluation of patient's condition.   ____________________________________________   FINAL CLINICAL IMPRESSION(S) / ED DIAGNOSES  Left foot pain Peripheral artery occlusion   Minna Antis,  MD 03/06/20 1555

## 2020-03-06 NOTE — ED Notes (Signed)
This Rn attempted to stick patient x2 without success for IV or bloodwork. Legrand Como also attempted to stick patient without success, IV team consult placed.

## 2020-03-06 NOTE — H&P (View-Only) (Signed)
[facility] Daily Progress Note   Subjective: The patient is a 39 year old female well-known to our service with known atherosclerotic disease to the bilateral lower extremity with recent endovascular intervention to the left lower extremity on January 11, 2020,  1. Ultrasound guidance for vascular access right femoral artery 2. Catheter placement into left common femoral artery from right femoral approach 3. Aortogram and selective left lower extremity angiogram including selective image of the posterior tibial artery 4. Percutaneous transluminal angioplasty of left posterior tibial artery and tibioperoneal trunk with 2-1/2 and 3 mm diameter angioplasty balloons 5.  Percutaneous transluminal angioplasty of the left popliteal artery and SFA with 4 mm diameter Lutonix drug-coated angioplasty balloon             6.  Mechanical thrombectomy with the penumbra cat 6 device to the left SFA, popliteal artery, tibioperoneal trunk, and posterior tibial arteries with the penumbra cat 6 device             7.  Viabahn covered stent placement to the most distal SFA, popliteal artery, and tibioperoneal trunk with 6 mm diameter by 25 cm length stent for residual stenosis and thrombus after above procedures 8. StarClose closure device right femoral artery  The patient was discharged on aspirin and Eliquis and highly encouraged tobacco cessation.  Patient states that she has been taking only Eliquis and has "cut back" on tobacco abuse.  Last seen in our outpatient clinic on February 07, 2020 and noted to have a slow healing wound to the left first toe.  At that time, our office expedited a podiatry referral for the wound on her first toe however the patient refused due to having to pay $100 prepayment for patients without insurance.  Recent ABI conducted in our office on January 23, 2020 was notable for:  Right lower  extremity: 0.87, biphasic waveforms Left lower extremity: 1.10, triphasic waveforms  Patient was due to follow-up in our office on February 21, 2020 for a wound check however was a no-show.  Patient presented to the [facility] emergency department today with progressively worsening pain to the left foot.  Also notes the wound to her first left toe has worsened as well.  Denies any fever, nausea vomiting.  Denies any shortness of breath or chest pain.  Vascular surgery was consulted by Dr. Lenard Lance for possible endovascular intervention.  Objective: Vitals:   03/06/20 1440 03/06/20 1441  BP:  (!) 172/125  Pulse:  (!) 105  Resp:  20  Temp:  98.4 F (36.9 C)  TempSrc:  Oral  SpO2:  99%  Weight: 81.6 kg   Height: 5\' 5"  (1.651 m)    No intake or output data in the 24 hours ending 03/06/20 1625  Physical Exam: A&Ox3, NAD CV: RRR Pulmonary: CTA Bilaterally Abdomen: Soft, Nontender, Nondistended Vascular:  Left Lower Extremity: Thigh soft.  Calf soft.  Extremities warm distally toes.  Hard to palpate pedal pulses.  Motor/sensory is intact.  Gangrenous changes noted to the left first toe.   Laboratory: CBC    Component Value Date/Time   WBC 6.9 01/12/2020 0608   HGB 7.2 (L) 01/12/2020 0608   HGB 11.7 (L) 04/24/2013 1557   HCT 25.6 (L) 01/12/2020 0608   HCT 35.6 04/24/2013 1557   PLT 434 (H) 01/12/2020 0608   PLT 316 04/24/2013 1557   BMET    Component Value Date/Time   NA 139 01/11/2020 0456   NA 134 (L) 04/24/2013 1557  K 3.9 01/11/2020 0456   K 4.4 04/24/2013 1557   CL 108 01/11/2020 0456   CL 105 04/24/2013 1557   CO2 24 01/11/2020 0456   CO2 24 04/24/2013 1557   GLUCOSE 118 (H) 01/11/2020 0456   GLUCOSE 103 (H) 04/24/2013 1557   BUN 9 01/11/2020 0456   BUN 7 04/24/2013 1557   CREATININE 0.57 01/11/2020 0456   CREATININE 0.51 (L) 04/24/2013 1557   CALCIUM 8.5 (L) 01/11/2020 0456   CALCIUM 9.1 04/24/2013 1557   GFRNONAA >60 01/11/2020  0456   GFRNONAA >60 04/24/2013 1557   GFRAA >60 01/11/2020 0456   GFRAA >60 04/24/2013 1557   Assessment/Planning: The patient is a 39 year old female with a past medical history of diabetes, hypertension, active tobacco abuse and known atherosclerotic disease to the bilateral lower extremity requiring endovascular intervention to the left lower extremity on January 11, 2020 who presented to the [facility] emergency department today with progressively worsening left foot pain and chronic wound to the left first toe  1) 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 - Upon discharge, the patient was prescribed Aspirin and Eliquis.  Patient states that she has been compliant with Eliquis. - Patient was referred to podiatry by our office for chronic wound assessment however declined the appointment due to having to pay a $100 prepayment due to lack of insurance. - Recent ABI conducted in our office on January 23, 2020 was notable for: Right lower extremity: 0.87, biphasic waveforms Left lower extremity: 1.10, triphasic waveforms -In the setting of multiple risk factors for atherosclerotic disease, active tobacco abuse and chronic nonhealing wound to the left first toe would recommend the patient undergo a left lower extremity angiogram with possible intervention and attempt assess the patient's anatomy and contributing degree of atherosclerotic disease.  If appropriate, an attempt to revascularize leg made at that time.  Procedure, risks and benefits were explained to the patient.  All questions were answered.  The patient wishes to proceed.  2) Progressively Worsening Chronic Wound Left First Toe: Will consult podiatry to assess if toe is salvageable Patient be undergoing angiogram and attempt to improve arterial flow  3) Tobacco Abuse: Patient notes  that she has "cut back" on smoking tobacco however does still actively engage in a daily basis. We had a discussion for approximately three minutes regarding the absolute need for smoking cessation due to the deleterious nature of tobacco on the vascular system. We discussed the tobacco use would diminish patency of any intervention, and likely significantly worsen progression of disease.   4) Diabetes: On appropriate medications Unsure if controlled Will order hemoglobin A1c in a.m.  Discussed with Dr. Wallis Mart Rhesa Forsberg PA-C 03/06/2020 4:25 PM

## 2020-03-06 NOTE — H&P (Signed)
Name: Jocelyn [name] MRN: [id number] DOB: May 23, 1981    ADMISSION DATE:  03/06/2020 CONSULTATION DATE:  03/06/2020  REFERRING MD :  Dr. Lenard Lance  CHIEF COMPLAINT:  Left lower extremity pain / Left foot pain  BRIEF PATIENT DESCRIPTION:  39 y.o. Female with a PMH significant for PAD, DM, HTN s/p LLE angiogram w/ stent placement on 01/11/2020. She is admitted 03/06/2020 due to Rest pain of LLE concerning for reocclusion/rethrombosis of previous stent and Gangrenous chronic wound of left great toe.  Vascular Surgery and Podiatry consulted.  Plan for urgent Angiogram and lysis catheter for infusion of tPA overnight with repeat Angiogram tomorrow morning 03/07/2020.  SIGNIFICANT EVENTS  8/26: Presents to ED; PCCM asked to admit to [facility] 8/27: Vascular Surgery taking for urgent Angiogram with plan for Lysis catheter for tPA overnight  STUDIES:  N/A  CULTURES: SARS-CoV-2 PCR 8/26>>negative  ANTIBIOTICS: N/A  HISTORY OF PRESENT ILLNESS:   Jocelyn [name] is a 39 year old female with a past medical history significant for peripheral arterial disease, diabetes mellitus, hypertension, and recent left lower extremity angiogram with stent placement to the SFA on January 11, 2020.  She presents to [facility] ED on 03/06/2020 due to rest pain of the left lower extremity and foot, along with worsening chronic wound to the left first toe. She denies chest pain, shortness of breath, cough, abdominal pain, nausea, vomiting, diarrhea, dysuria, headache, palpitations, fever, chills.  Upon presentation to the ED her vitals included: Temperature 98.4 orally, respiratory rate 20, pulse 105, blood pressure 172/125, and SPO2 99% on room air.  ED provider was unable to Doppler pulses in the left dorsalis pedis with very minimal dopplerable posterior tibial pulse.  Vascular surgery was contacted who recommended placing patient on heparin drip, and will be taking the patient urgently to the vascular lab for angiogram and  placement of lysis catheter and infusion of TPA overnight with plans for repeat angiogram tomorrow 03/07/2020.  Podiatry has been consulted to see if toe is salveagable.  Lab work is unremarkable (CBC & CMP), lactic acid 1.0.  Procalcitonin is pending.  Her COVID-10 PCR is negative.  PCCM is asked to admit the pt to the [facility] unit for close monitoring while tPA infusing overnight.   PAST MEDICAL HISTORY :   has a past medical history of Diabetes mellitus, type 2 (HCC) (12/17/2019) and IDA (iron deficiency anemia) (12/17/2019).  has a past surgical history that includes Appendectomy and Lower Extremity Angiography (Left, 01/11/2020). Prior to Admission medications   Medication Sig Start Date End Date Taking? Authorizing Provider  amLODipine (NORVASC) 5 MG tablet Take 5 mg by mouth daily. 09/06/19   [provider]  amoxicillin-clavulanate (AUGMENTIN) 875-125 MG tablet Take 1 tablet by mouth 2 (two) times daily. 02/07/20   Georgiana Spinner, NP  apixaban (ELIQUIS) 5 MG TABS tablet Take 1 tablet (5 mg total) by mouth 2 (two) times daily. 01/12/20   Enedina Finner, MD  aspirin EC 81 MG tablet Take 1 tablet (81 mg total) by mouth daily. Swallow whole. 01/04/20 01/03/21  Stegmayer, Ranae Plumber, PA-C  Ferrous Sulfate (IRON) 325 (65 Fe) MG TABS Take 1 tablet (325 mg total) by mouth daily. 12/17/19 02/15/20  Sharman Cheek, MD  gabapentin (NEURONTIN) 300 MG capsule Take 300 mg by mouth 3 (three) times daily. 12/21/19   [provider]  lisinopril (ZESTRIL) 20 MG tablet Take 20 mg by mouth daily. 09/06/19   [provider]  metFORMIN (GLUCOPHAGE) 500 MG tablet Take 1 tablet (500  mg total) by mouth 2 (two) times daily with a meal. 12/17/19   Sharman Cheek, MD  nicotine (NICODERM CQ - DOSED IN MG/24 HOURS) 21 mg/24hr patch Place 1 patch (21 mg total) onto the skin daily. 01/12/20   Enedina Finner, MD   No Known Allergies  FAMILY HISTORY:  family history includes Hypertension in her brother,  mother, and sister. SOCIAL HISTORY:  reports that she has been smoking cigarettes. She has been smoking about 0.50 packs per day. She has never used smokeless tobacco. She reports current alcohol use. She reports that she does not use drugs.   COVID-19 DISASTER DECLARATION:  FULL CONTACT PHYSICAL EXAMINATION WAS NOT POSSIBLE DUE TO TREATMENT OF COVID-19 AND  CONSERVATION OF PERSONAL PROTECTIVE EQUIPMENT, LIMITED EXAM FINDINGS INCLUDE-  Patient assessed or the symptoms described in the history of present illness.  In the context of the Global COVID-19 pandemic, which necessitated consideration that the patient might be at risk for infection with the SARS-CoV-2 virus that causes COVID-19, Institutional protocols and algorithms that pertain to the evaluation of patients at risk for COVID-19 are in a state of rapid change based on information released by regulatory bodies including the CDC and federal and state organizations. These policies and algorithms were followed during the patient's care while in hospital.  REVIEW OF SYSTEMS:  Positives in BOLD Constitutional: Negative for fever, chills, weight loss, malaise/fatigue and diaphoresis.  HENT: Negative for hearing loss, ear pain, nosebleeds, congestion, sore throat, neck pain, tinnitus and ear discharge.   Eyes: Negative for blurred vision, double vision, photophobia, pain, discharge and redness.  Respiratory: Negative for cough, hemoptysis, sputum production, shortness of breath, wheezing and stridor.   Cardiovascular: Negative for chest pain, palpitations, orthopnea, claudication, +leg swelling, + rest pain, and PND.  Gastrointestinal: Negative for heartburn, nausea, vomiting, abdominal pain, diarrhea, constipation, blood in stool and melena.  Genitourinary: Negative for dysuria, urgency, frequency, hematuria and flank pain.  Musculoskeletal: Negative for myalgias, back pain, joint pain and falls.  Skin: Negative for itching and rash.    Neurological: Negative for dizziness, tingling, tremors, sensory change, speech change, focal weakness, seizures, loss of consciousness, weakness and headaches.  Endo/Heme/Allergies: Negative for environmental allergies and polydipsia. Does not bruise/bleed easily.  SUBJECTIVE:  Report pain to Left foot (improved after IV morphine) Denies chest pain, SOB, cough, abdominal pain, N/V/D, fever, chills, dysuria, HA On room air  VITAL SIGNS: Temp:  [98.4 F (36.9 C)] 98.4 F (36.9 C) (08/26 1441) Pulse Rate:  [105] 105 (08/26 1441) Resp:  [20] 20 (08/26 1441) BP: (172)/(125) 172/125 (08/26 1441) SpO2:  [99 %] 99 % (08/26 1441) Weight:  [81.6 kg] 81.6 kg (08/26 1440)  PHYSICAL EXAMINATION: General: Acutely ill-appearing female, sitting in bed, on room air, no acute distress Neuro: Awake, alert and oriented x4, follows commands, no focal deficits, speech clear HEENT: Atraumatic, normocephalic, neck supple, no JVD Cardiovascular: Regular rate and rhythm, S1-S2, no murmurs, rubs, gallops; unable to palpate left DP & left PT pulses Lungs: Clear to auscultation bilaterally, even, nonlabored, normal effort Abdomen: Soft, nondistended, nontender, no guarding or rebound tenderness, bowel sounds positive x4 Musculoskeletal: Normal bulk and tone, no edema, no clubbing Skin: Warm and dry.  Gangrenous changes noted to left first toe       No results for input(s): NA, K, CL, CO2, BUN, CREATININE, GLUCOSE in the last 168 hours. No results for input(s): HGB, HCT, WBC, PLT in the last 168 hours. No results found.  ASSESSMENT / PLAN:  Left Lower Extremity PAD w/ Rest pain & Gangrenous Changes to Left First Toe -Vascular Surgery consulted, appreciate input ~ follow up recommendations -Heparin gtt -Pt to undergo urgent Angiogram on 03/06/2020 with Lysis catheter placement for indwelling of tPA overnight and repeat Angiogram on 03/07/2020 -Pain Control -Precedex if needed    Progressively  Worsening Chronic Wound to Left First Toe (Gangrenous) -Monitor fever curve -Trend WBC's -Check Procalcitonin ~ if elevated will start empiric antibiotics -Follow cultures as above -Podiatry consulted to see if toe is salvageable, appreciate input ~ follow up recommendations -Wound care   Hypertension -Continuous cardiac monitoring -Maintain MAP >65 -Prn Hydralazine   Diabetes Mellitus -CBG's -SSI -Follow ICU Hypo/hyperglycemia protocol -Hold home Metformin -Check Hgb A1c in the a.m.          BEST PRACTICES DISPOSITION: STEPDOWN GOALS OF CARE: FULL CODE VTE PROPHYLAXIS/ANTICOAGULATION: HEPARIN GTT CONSULTS: VASCULAR SURGERY, PODIATRY  UPDATES: UPDATED PT AT BEDSIDE 03/06/20   Harlon Ditty, AGACNP-BC [facility] Pager: [phone]  03/06/2020, 4:23 PM

## 2020-03-06 NOTE — Interval H&P Note (Signed)
History and Physical Interval Note:  03/06/2020 5:45 PM  Jocelyn [name]  has presented today for surgery, with the diagnosis of Atherosclerotic disease with gangrene.  The various methods of treatment have been discussed with the patient and family. After consideration of risks, benefits and other options for treatment, the patient has consented to  Procedure(s): Lower Extremity Angiography (Left) as a surgical intervention.  The patient's history has been reviewed, patient examined, no change in status, stable for surgery.  I have reviewed the patient's chart and labs.  Questions were answered to the patient's satisfaction.     [name]

## 2020-03-06 NOTE — Op Note (Signed)
[facility]  Percutaneous Study/Intervention Procedural Note   Date of Surgery: 03/06/2020  Surgeon(s):Kalla Watson    Assistants:none  Pre-operative Diagnosis: PAD with rest pain left lower extremity, acute on chronic ischemia after recent intervention 2 months ago  Post-operative diagnosis:  Same  Procedure(s) Performed:             1.  Ultrasound guidance for vascular access right femoral artery             2.  Catheter placement into left posterior tibial artery from right femoral approach             3.  Aortogram and selective left lower extremity angiogram             4.   Catheter placement for thrombolytic therapy with 6 mg of TPA instilled in the left superficial femoral artery, popliteal artery, and posterior tibial artery and the catheter was left in place for continuous infusion of thrombolytic therapy.  This is a 135 cm total length 50 cm working length catheter             5.   Ultrasound guidance for vascular access right femoral vein  6.  Right femoral venous triple-lumen catheter placement with ultrasound and fluoroscopic guidance               EBL: 5 cc  Contrast: 35 cc  Fluoro Time: 2.3 minutes  Moderate Conscious Sedation Time: approximately 30 minutes using 4 mg of Versed and 100 mcg of Fentanyl              Indications:  Patient is a 39 y.o.female with recurrent rest pain in the left foot.  She is about 2 months status post left lower extremity revascularization for limb threatening ischemia. The patient is brought in for angiography for further evaluation and potential treatment.  Due to the limb threatening nature of the situation, angiogram was performed for attempted limb salvage. The patient is aware that if the procedure fails, amputation would be expected.  The patient also understands that even with successful revascularization, amputation may still be required due to the severity of the situation.  Risks and benefits are discussed  and informed consent is obtained.   Procedure:  The patient was identified and appropriate procedural time out was performed.  The patient was then placed supine on the table and prepped and draped in the usual sterile fashion. Moderate conscious sedation was administered during a face to face encounter with the patient throughout the procedure with my supervision of the RN administering medicines and monitoring the patient's vital signs, pulse oximetry, telemetry and mental status throughout from the start of the procedure until the patient was taken to the recovery room. Ultrasound was used to evaluate the right common femoral artery.  It was patent .  A digital ultrasound image was acquired.  A Seldinger needle was used to access the right common femoral artery under direct ultrasound guidance and a permanent image was performed.  A 0.035 J wire was advanced without resistance and a 5Fr sheath was placed.  Pigtail catheter was placed into the aorta and an AP aortogram was performed. This demonstrated normal renal arteries and normal aorta and iliac segments without significant stenosis. I then crossed the aortic bifurcation and advanced to the left femoral head. Selective left lower extremity angiogram was then performed. This demonstrated the common femoral artery and profunda femoris artery are patent.  The SFA occluded just above the previously  placed stents and there was very poor distal reconstitution that was difficult to see on initial imaging. It was felt that it was in the patient's best interest to proceed with intervention after these images to avoid a second procedure and a larger amount of contrast and fluoroscopy based off of the findings from the initial angiogram. The patient was systemically heparinized and a 6 [place] [employer] sheath was then placed over the [employer] wire. I then used a Kumpe catheter and the advantage wire to navigate very easily through the occlusion making this  obvious it was rush thrombus.  The wire went into the posterior tibial artery and the Kumpe catheter was advanced into the posterior tibial artery where selective imaging was performed showing this to be the posterior tibial artery was continuous to the foot from the mid segment down.  I then replaced the wire and remove the diagnostic catheter.  I felt her best chance for success would be continuous thrombolytic therapy to debulk the clot burden and then a repeat angiogram tomorrow.  I then selected a 135 cm total length 50 working length thrombolytic catheter.  The patient was systemically heparinized.  This catheter was advanced over the advantage wire with the most proximal portion of the proximal SFA traversing through the popliteal artery, tibioperoneal trunk, and the proximal portion of the posterior tibial artery terminating in the proximal to mid posterior tibial artery.  6 mg of TPA were then instilled through this catheter and the catheter was flushed.  It was then secured and left in place for continuous thrombolytic therapy overnight. I elected to terminate the procedure. The sheath and lysis catheter were secured with Prolene sutures.  To have durable venous access, I placed a right femoral central venous catheter.  This was done under direct ultrasound guidance and access was obtained with a Seldinger needle with ultrasound guidance and a permanent image was recorded.  A J-wire was placed.  After skin nick and dilatation a triple-lumen catheter was placed over the wire and the wire was removed.  Fluoroscopy showed the tip of the catheter to be just into the inferior vena cava.  All 3 lm withdrew dark red blood and flushed easily with sterile saline.  It was secured to the skin with 3 silk sutures.  The patient was taken to the recovery room in stable condition having tolerated the procedure well.  Findings:               Aortogram:  Renal arteries were widely patent.  Aorta and iliac arteries were  patent without significant stenosis             Left lower Extremity:  The common femoral artery and profunda femoris artery are patent.  The SFA occluded just above the previously placed stents and there was very poor distal reconstitution that was difficult to see on initial imaging.   Disposition: Patient was taken to the recovery room in stable condition having tolerated the procedure well.  Complications: None  [name] 03/06/2020 6:27 PM   This note was created with Dragon Medical transcription system. Any errors in dictation are purely unintentional.

## 2020-03-06 NOTE — Progress Notes (Addendum)
ANTICOAGULATION CONSULT NOTE - Follow Up Consult  Pharmacy Consult for heparin infusion  Indication: PAD/Peripheral artery occlusion  No Known Allergies  Patient Measurements: Height: 5\' 5"  (165.1 cm) Weight: 81.6 kg (180 lb) IBW/kg (Calculated) : 57 Heparin Dosing Weight: 74kg  Vital Signs: Temp: 98.4 F (36.9 C) (08/26 1441) Temp Source: Oral (08/26 1441) BP: 172/125 (08/26 1441) Pulse Rate: 105 (08/26 1441)  Labs: No results for input(s): HGB, HCT, PLT, APTT, LABPROT, INR, HEPARINUNFRC, HEPRLOWMOCWT, CREATININE, CKTOTAL, CKMB, TROPONINIHS in the last 72 hours.  CrCl cannot be calculated (Patient's most recent lab result is older than the maximum 21 days allowed.).  Assessment: Pharmacy consulted for heparin infusion dosing and monitoring for 39 yo female for PAD/Peripheral artery occlusion.  Patients last reported dose of apixaban was 8/25 AM.   8/26 1900 Patient is S/P catheter placement and thrombolytic therapy. Catheter left in place for continuous infusion.   Goal of Therapy:  Heparin level 0.2 - 0.5 units/ml aPTT 60-80 seconds Monitor platelets by anticoagulation protocol: Yes   Plan:  Baseline labs ordered. Per Vascular start heparin  infusion at 600 units/hr and target lower aPTT and HL. Infusion is  Via right arterial sheath Will need to adjust dose based on aPTT levels until aPTT and HL are correlating. Check aPTT level in 6 hours and daily while on heparin Continue to monitor H&H and platelets  9/26, PharmD, BCPS Clinical Pharmacist 03/06/2020 4:32 PM

## 2020-03-06 NOTE — Progress Notes (Signed)
[facility] Daily Progress Note   Subjective: The patient is a 39-year-old female well-known to our service with known atherosclerotic disease to the bilateral lower extremity with recent endovascular intervention to the left lower extremity on January 11, 2020,  1. Ultrasound guidance for vascular access right femoral artery 2. Catheter placement into left common femoral artery from right femoral approach 3. Aortogram and selective left lower extremity angiogram including selective image of the posterior tibial artery 4. Percutaneous transluminal angioplasty of left posterior tibial artery and tibioperoneal trunk with 2-1/2 and 3 mm diameter angioplasty balloons 5.  Percutaneous transluminal angioplasty of the left popliteal artery and SFA with 4 mm diameter Lutonix drug-coated angioplasty balloon             6.  Mechanical thrombectomy with the penumbra cat 6 device to the left SFA, popliteal artery, tibioperoneal trunk, and posterior tibial arteries with the penumbra cat 6 device             7.  Viabahn covered stent placement to the most distal SFA, popliteal artery, and tibioperoneal trunk with 6 mm diameter by 25 cm length stent for residual stenosis and thrombus after above procedures 8. StarClose closure device right femoral artery  The patient was discharged on aspirin and Eliquis and highly encouraged tobacco cessation.  Patient states that she has been taking only Eliquis and has "cut back" on tobacco abuse.  Last seen in our outpatient clinic on February 07, 2020 and noted to have a slow healing wound to the left first toe.  At that time, our office expedited a podiatry referral for the wound on her first toe however the patient refused due to having to pay $100 prepayment for patients without insurance.  Recent ABI conducted in our office on January 23, 2020 was notable for:  Right lower  extremity: 0.87, biphasic waveforms Left lower extremity: 1.10, triphasic waveforms  Patient was due to follow-up in our office on February 21, 2020 for a wound check however was a no-show.  Patient presented to the [facility] emergency department today with progressively worsening pain to the left foot.  Also notes the wound to her first left toe has worsened as well.  Denies any fever, nausea vomiting.  Denies any shortness of breath or chest pain.  Vascular surgery was consulted by Dr. Paduchowski for possible endovascular intervention.  Objective: Vitals:   03/06/20 1440 03/06/20 1441  BP:  (!) 172/125  Pulse:  (!) 105  Resp:  20  Temp:  98.4 F (36.9 C)  TempSrc:  Oral  SpO2:  99%  Weight: 81.6 kg   Height: 5' 5" (1.651 m)    No intake or output data in the 24 hours ending 03/06/20 1625  Physical Exam: A&Ox3, NAD CV: RRR Pulmonary: CTA Bilaterally Abdomen: Soft, Nontender, Nondistended Vascular:  Left Lower Extremity: Thigh soft.  Calf soft.  Extremities warm distally toes.  Hard to palpate pedal pulses.  Motor/sensory is intact.  Gangrenous changes noted to the left first toe.   Laboratory: CBC    Component Value Date/Time   WBC 6.9 01/12/2020 0608   HGB 7.2 (L) 01/12/2020 0608   HGB 11.7 (L) 04/24/2013 1557   HCT 25.6 (L) 01/12/2020 0608   HCT 35.6 04/24/2013 1557   PLT 434 (H) 01/12/2020 0608   PLT 316 04/24/2013 1557   BMET    Component Value Date/Time   NA 139 01/11/2020 0456   NA 134 (L) 04/24/2013 1557     K 3.9 01/11/2020 0456   K 4.4 04/24/2013 1557   CL 108 01/11/2020 0456   CL 105 04/24/2013 1557   CO2 24 01/11/2020 0456   CO2 24 04/24/2013 1557   GLUCOSE 118 (H) 01/11/2020 0456   GLUCOSE 103 (H) 04/24/2013 1557   BUN 9 01/11/2020 0456   BUN 7 04/24/2013 1557   CREATININE 0.57 01/11/2020 0456   CREATININE 0.51 (L) 04/24/2013 1557   CALCIUM 8.5 (L) 01/11/2020 0456   CALCIUM 9.1 04/24/2013 1557   GFRNONAA >60 01/11/2020  0456   GFRNONAA >60 04/24/2013 1557   GFRAA >60 01/11/2020 0456   GFRAA >60 04/24/2013 1557   Assessment/Planning: The patient is a 39 year old female with a past medical history of diabetes, hypertension, active tobacco abuse and known atherosclerotic disease to the bilateral lower extremity requiring endovascular intervention to the left lower extremity on January 11, 2020 who presented to the [facility] emergency department today with progressively worsening left foot pain and chronic wound to the left first toe  1) 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 - Upon discharge, the patient was prescribed Aspirin and Eliquis.  Patient states that she has been compliant with Eliquis. - Patient was referred to podiatry by our office for chronic wound assessment however declined the appointment due to having to pay a $100 prepayment due to lack of insurance. - Recent ABI conducted in our office on January 23, 2020 was notable for: Right lower extremity: 0.87, biphasic waveforms Left lower extremity: 1.10, triphasic waveforms -In the setting of multiple risk factors for atherosclerotic disease, active tobacco abuse and chronic nonhealing wound to the left first toe would recommend the patient undergo a left lower extremity angiogram with possible intervention and attempt assess the patient's anatomy and contributing degree of atherosclerotic disease.  If appropriate, an attempt to revascularize leg made at that time.  Procedure, risks and benefits were explained to the patient.  All questions were answered.  The patient wishes to proceed.  2) Progressively Worsening Chronic Wound Left First Toe: Will consult podiatry to assess if toe is salvageable Patient be undergoing angiogram and attempt to improve arterial flow  3) Tobacco Abuse: Patient notes  that she has "cut back" on smoking tobacco however does still actively engage in a daily basis. We had a discussion for approximately three minutes regarding the absolute need for smoking cessation due to the deleterious nature of tobacco on the vascular system. We discussed the tobacco use would diminish patency of any intervention, and likely significantly worsen progression of disease.   4) Diabetes: On appropriate medications Unsure if controlled Will order hemoglobin A1c in a.m.  Discussed with Dr. Wallis Mart Brigg Cape PA-C 03/06/2020 4:25 PM

## 2020-03-06 NOTE — ED Triage Notes (Signed)
Pt reports had surgery on her left foot for a blood clot about a month ago and then she got an infection. Pt reports has been following up with vein and vascular but last pm she started having pain again.

## 2020-03-07 ENCOUNTER — Encounter
Admission: EM | Disposition: A | Discharge status: Home / Self Care | Payer: Self-pay | Source: Home / Self Care | Attending: Internal Medicine

## 2020-03-07 ENCOUNTER — Encounter: Payer: Self-pay | Admitting: Vascular Surgery

## 2020-03-07 ENCOUNTER — Inpatient Hospital Stay: Payer: Self-pay

## 2020-03-07 HISTORY — PX: LOWER EXTREMITY ANGIOGRAPHY: CATH118251

## 2020-03-07 LAB — BASIC METABOLIC PANEL
Anion gap: 9 (ref 5–15)
BUN: 12 mg/dL (ref 6–20)
CO2: 23 mmol/L (ref 22–32)
Calcium: 9.1 mg/dL (ref 8.9–10.3)
Chloride: 105 mmol/L (ref 98–111)
Creatinine, Ser: 0.62 mg/dL (ref 0.44–1.00)
GFR calc Af Amer: >60 mL/min (ref >60)
GFR calc non Af Amer: >60 mL/min (ref >60)
Glucose, Bld: 126 mg/dL — ABNORMAL HIGH (ref 70–99)
Potassium: 3.5 mmol/L (ref 3.5–5.1)
Sodium: 137 mmol/L (ref 135–145)

## 2020-03-07 LAB — GLUCOSE, CAPILLARY
Glucose-Capillary: 170 mg/dL — ABNORMAL HIGH (ref 70–99)
Glucose-Capillary: 140 mg/dL — ABNORMAL HIGH (ref 70–99)
Glucose-Capillary: 197 mg/dL — ABNORMAL HIGH (ref 70–99)
Glucose-Capillary: 110 mg/dL — ABNORMAL HIGH (ref 70–99)

## 2020-03-07 LAB — FIBRINOGEN: Fibrinogen: 340 mg/dL (ref 210–475)

## 2020-03-07 LAB — CBC
HCT: 37.4 % (ref 36.0–46.0)
Hemoglobin: 11.8 g/dL — ABNORMAL LOW (ref 12.0–15.0)
MCH: 29.5 pg (ref 26.0–34.0)
MCHC: 31.6 g/dL (ref 30.0–36.0)
MCV: 93.5 fL (ref 80.0–100.0)
Platelets: 278 10*3/uL (ref 150–400)
RBC: 4 MIL/uL (ref 3.87–5.11)
RDW: 16.2 % — ABNORMAL HIGH (ref 11.5–15.5)
WBC: 8.1 10*3/uL (ref 4.0–10.5)
nRBC: 0 % (ref 0.0–0.2)

## 2020-03-07 LAB — APTT: aPTT: 34 seconds (ref 24–36)

## 2020-03-07 LAB — HEPARIN LEVEL (UNFRACTIONATED): Heparin Unfractionated: 0.25 IU/mL — ABNORMAL LOW (ref 0.30–0.70)

## 2020-03-07 SURGERY — LOWER EXTREMITY ANGIOGRAPHY
Anesthesia: Moderate Sedation | Laterality: Left

## 2020-03-07 MED ORDER — MIDAZOLAM HCL 2 MG/2ML IJ SOLN
INTRAMUSCULAR | Status: AC
  Filled 2020-03-07: qty 2

## 2020-03-07 MED ORDER — MIDAZOLAM HCL 2 MG/2ML IJ SOLN
INTRAMUSCULAR | Status: DC | PRN
  Administered 2020-03-07 (×2): 2 mg via INTRAVENOUS

## 2020-03-07 MED ORDER — APIXABAN 5 MG PO TABS
5.0000 mg | ORAL_TABLET | Freq: Two times a day (BID) | ORAL | Status: DC
  Administered 2020-03-08 – 2020-03-09 (×4): 5 mg via ORAL
  Filled 2020-03-07 (×4): qty 1

## 2020-03-07 MED ORDER — FENTANYL CITRATE (PF) 100 MCG/2ML IJ SOLN
INTRAMUSCULAR | Status: AC
Start: 2020-03-07 — End: 2020-03-07
  Filled 2020-03-07: qty 2

## 2020-03-07 MED ORDER — IODIXANOL 320 MG/ML IV SOLN
INTRAVENOUS | Status: DC | PRN
  Administered 2020-03-07: 65 mL via INTRA_ARTERIAL

## 2020-03-07 MED ORDER — ASPIRIN EC 81 MG PO TBEC
81.0000 mg | DELAYED_RELEASE_TABLET | Freq: Every day | ORAL | Status: DC
  Administered 2020-03-08 – 2020-03-09 (×2): 81 mg via ORAL
  Filled 2020-03-07 (×2): qty 1

## 2020-03-07 MED ORDER — NITROGLYCERIN 1 MG/10 ML FOR IR/CATH LAB
INTRA_ARTERIAL | Status: DC | PRN
  Administered 2020-03-07: 300 ug
  Administered 2020-03-07: 200 ug

## 2020-03-07 MED ORDER — HEPARIN SODIUM (PORCINE) 1000 UNIT/ML IJ SOLN
INTRAMUSCULAR | Status: AC
  Filled 2020-03-07: qty 1

## 2020-03-07 MED ORDER — FENTANYL CITRATE (PF) 100 MCG/2ML IJ SOLN
INTRAMUSCULAR | Status: DC | PRN
Start: 2020-03-07 — End: 2020-03-07
  Administered 2020-03-07 (×4): 50 ug via INTRAVENOUS

## 2020-03-07 MED ORDER — TIROFIBAN (AGGRASTAT) BOLUS VIA INFUSION: 25.0000 ug/kg | Freq: Once | INTRAVENOUS | Status: AC

## 2020-03-07 MED ORDER — FENTANYL CITRATE (PF) 100 MCG/2ML IJ SOLN
INTRAMUSCULAR | Status: AC
  Filled 2020-03-07: qty 2

## 2020-03-07 MED ORDER — GABAPENTIN 300 MG PO CAPS
300.0000 mg | ORAL_CAPSULE | Freq: Three times a day (TID) | ORAL | Status: DC
  Administered 2020-03-07 – 2020-03-09 (×8): 300 mg via ORAL
  Filled 2020-03-07 (×8): qty 1

## 2020-03-07 MED ORDER — CEFAZOLIN SODIUM-DEXTROSE 2-4 GM/100ML-% IV SOLN
INTRAVENOUS | Status: AC
  Administered 2020-03-07: 2 g via INTRAVENOUS
  Filled 2020-03-07: qty 100

## 2020-03-07 MED ORDER — NITROGLYCERIN 1 MG/10 ML FOR IR/CATH LAB
INTRA_ARTERIAL | Status: AC
  Filled 2020-03-07: qty 10

## 2020-03-07 MED ORDER — COLLAGENASE 250 UNIT/GM EX OINT
TOPICAL_OINTMENT | Freq: Every day | CUTANEOUS | Status: DC
  Filled 2020-03-07: qty 30

## 2020-03-07 MED ORDER — FERROUS SULFATE 325 (65 FE) MG PO TABS
325.0000 mg | ORAL_TABLET | Freq: Every day | ORAL | Status: DC
  Administered 2020-03-07 – 2020-03-09 (×3): 325 mg via ORAL
  Filled 2020-03-07 (×3): qty 1

## 2020-03-07 MED ORDER — TIROFIBAN HCL IV 12.5 MG/250 ML
INTRAVENOUS | Status: AC
  Administered 2020-03-07: 2180 ug via INTRAVENOUS
  Filled 2020-03-07: qty 250

## 2020-03-07 MED ORDER — CEFAZOLIN SODIUM-DEXTROSE 2-4 GM/100ML-% IV SOLN
INTRAVENOUS | Status: AC
  Filled 2020-03-07: qty 100

## 2020-03-07 MED ORDER — AMLODIPINE BESYLATE 5 MG PO TABS
5.0000 mg | ORAL_TABLET | Freq: Every day | ORAL | Status: DC
  Administered 2020-03-08 – 2020-03-09 (×2): 5 mg via ORAL
  Filled 2020-03-07 (×2): qty 1

## 2020-03-07 MED ORDER — HEPARIN SODIUM (PORCINE) 1000 UNIT/ML IJ SOLN
INTRAMUSCULAR | Status: DC | PRN
  Administered 2020-03-07: 3000 [IU] via INTRAVENOUS

## 2020-03-07 MED ORDER — TIROFIBAN HCL IN NACL 5-0.9 MG/100ML-% IV SOLN
0.1500 ug/kg/min | INTRAVENOUS | Status: AC
  Filled 2020-03-07 (×4): qty 100

## 2020-03-07 SURGICAL SUPPLY — 16 items
BALLN LUTONIX 018 4X100X130 (BALLOONS) ×3
BALLN LUTONIX 018 5X60X130 (BALLOONS) ×3
BALLN ULTRVRSE 2.5X300X150 (BALLOONS) ×3
BALLOON LUTONIX 018 4X100X130 (BALLOONS) ×1 IMPLANT
BALLOON LUTONIX 018 5X60X130 (BALLOONS) ×1 IMPLANT
BALLOON ULTRVRSE 2.5X300X150 (BALLOONS) ×1 IMPLANT
CANISTER PENUMBRA ENGINE (MISCELLANEOUS) ×3 IMPLANT
CATH INDIGO CAT6 KIT (CATHETERS) ×3 IMPLANT
DEVICE SAFEGUARD 24CM (GAUZE/BANDAGES/DRESSINGS) ×3 IMPLANT
DEVICE STARCLOSE SE CLOSURE (Vascular Products) ×3 IMPLANT
KIT ENCORE 26 ADVANTAGE (KITS) ×3 IMPLANT
PACK ANGIOGRAPHY (CUSTOM PROCEDURE TRAY) ×3 IMPLANT
STENT VIABAHN 5X25X120 (Permanent Stent) ×3 IMPLANT
STENT VIABAHN 6X50X120 (Permanent Stent) ×3 IMPLANT
WIRE G V18X300CM (WIRE) ×3 IMPLANT
WIRE J 3MM .035X145CM (WIRE) ×3 IMPLANT

## 2020-03-07 NOTE — Progress Notes (Signed)
Name: Jocelyn [name] MRN: [id number] DOB: [birth year]    ADMISSION DATE:  03/06/2020 CONSULTATION DATE:  03/06/2020  REFERRING MD :  Dr. Lenard Lance  CHIEF COMPLAINT:  Left lower extremity pain / Left foot pain  BRIEF PATIENT DESCRIPTION:  39 y.o. Female with a PMH significant for PAD, DM, HTN s/p LLE angiogram w/ stent placement on 01/11/2020. She is admitted 03/06/2020 due to Rest pain of LLE concerning for reocclusion/rethrombosis of previous stent and Gangrenous chronic wound of left great toe.  Vascular Surgery and Podiatry consulted.  Plan for urgent Angiogram and lysis catheter for infusion of tPA overnight with repeat Angiogram tomorrow morning 03/07/2020.  SIGNIFICANT EVENTS  8/26: Presents to ED; PCCM asked to admit to [facility] 8/27: Vascular Surgery taking for urgent Angiogram with plan for Lysis catheter for tPA overnight.  STUDIES:  N/A  CULTURES: SARS-CoV-2 PCR 8/26>>negative  ANTIBIOTICS: N/A  HISTORY OF PRESENT ILLNESS:   Jocelyn [name] is a 39 year old female with a past medical history significant for peripheral arterial disease, diabetes mellitus, hypertension, and recent left lower extremity angiogram with stent placement to the SFA on January 11, 2020.  She presents to [facility] ED on 03/06/2020 due to rest pain of the left lower extremity and foot, along with worsening chronic wound to the left first toe. She denies chest pain, shortness of breath, cough, abdominal pain, nausea, vomiting, diarrhea, dysuria, headache, palpitations, fever, chills.  Upon presentation to the ED her vitals included: Temperature 98.4 orally, respiratory rate 20, pulse 105, blood pressure 172/125, and SPO2 99% on room air.  ED provider was unable to Doppler pulses in the left dorsalis pedis with very minimal dopplerable posterior tibial pulse.  Vascular surgery was contacted who recommended placing patient on heparin drip, and will be taking the patient urgently to the vascular lab for angiogram and  placement of lysis catheter and infusion of TPA overnight with plans for repeat angiogram tomorrow 03/07/2020.  Podiatry has been consulted to see if toe is salveagable.  Lab work is unremarkable (CBC & CMP), lactic acid 1.0.  Procalcitonin is pending.  Her COVID-10 PCR is negative.  PCCM is asked to admit the pt to the [facility] unit for close monitoring while tPA infusing overnight.   PAST MEDICAL HISTORY :   has a past medical history of Diabetes mellitus, type 2 (HCC) (12/17/2019) and IDA (iron deficiency anemia) (12/17/2019).  has a past surgical history that includes Appendectomy; Lower Extremity Angiography (Left, 01/11/2020); Lower Extremity Angiography (Left, 03/06/2020); and Lower Extremity Angiography (Left, 03/07/2020). Prior to Admission medications   Medication Sig Start Date End Date Taking? Authorizing Provider  amLODipine (NORVASC) 5 MG tablet Take 5 mg by mouth daily. 09/06/19   [provider]  amoxicillin-clavulanate (AUGMENTIN) 875-125 MG tablet Take 1 tablet by mouth 2 (two) times daily. 02/07/20   Georgiana Spinner, NP  apixaban (ELIQUIS) 5 MG TABS tablet Take 1 tablet (5 mg total) by mouth 2 (two) times daily. 01/12/20   Enedina Finner, MD  aspirin EC 81 MG tablet Take 1 tablet (81 mg total) by mouth daily. Swallow whole. 01/04/20 01/03/21  Stegmayer, Ranae Plumber, PA-C  Ferrous Sulfate (IRON) 325 (65 Fe) MG TABS Take 1 tablet (325 mg total) by mouth daily. 12/17/19 02/15/20  Sharman Cheek, MD  gabapentin (NEURONTIN) 300 MG capsule Take 300 mg by mouth 3 (three) times daily. 12/21/19   [provider]  lisinopril (ZESTRIL) 20 MG tablet Take 20 mg by mouth daily. 09/06/19   [provider]  metFORMIN (GLUCOPHAGE) 500 MG tablet Take 1 tablet (500 mg total) by mouth 2 (two) times daily with a meal. 12/17/19   Sharman Cheek, MD  nicotine (NICODERM CQ - DOSED IN MG/24 HOURS) 21 mg/24hr patch Place 1 patch (21 mg total) onto the skin daily. 01/12/20   Enedina Finner, MD   No  Known Allergies  FAMILY HISTORY:  family history includes Hypertension in her brother, mother, and sister. SOCIAL HISTORY:  reports that she has been smoking cigarettes. She has been smoking about 0.50 packs per day. She has never used smokeless tobacco. She reports current alcohol use. She reports that she does not use drugs.   COVID-19 DISASTER DECLARATION:  FULL CONTACT PHYSICAL EXAMINATION WAS NOT POSSIBLE DUE TO TREATMENT OF COVID-19 AND  CONSERVATION OF PERSONAL PROTECTIVE EQUIPMENT, LIMITED EXAM FINDINGS INCLUDE-  Patient assessed or the symptoms described in the history of present illness.  In the context of the Global COVID-19 pandemic, which necessitated consideration that the patient might be at risk for infection with the SARS-CoV-2 virus that causes COVID-19, Institutional protocols and algorithms that pertain to the evaluation of patients at risk for COVID-19 are in a state of rapid change based on information released by regulatory bodies including the CDC and federal and state organizations. These policies and algorithms were followed during the patient's care while in hospital.  REVIEW OF SYSTEMS:  Positives in BOLD Constitutional: Negative for fever, chills, weight loss, malaise/fatigue and diaphoresis.  HENT: Negative for hearing loss, ear pain, nosebleeds, congestion, sore throat, neck pain, tinnitus and ear discharge.   Eyes: Negative for blurred vision, double vision, photophobia, pain, discharge and redness.  Respiratory: Negative for cough, hemoptysis, sputum production, shortness of breath, wheezing and stridor.   Cardiovascular: Negative for chest pain, palpitations, orthopnea, claudication, +leg swelling, + rest pain, and PND.  Gastrointestinal: Negative for heartburn, nausea, vomiting, abdominal pain, diarrhea, constipation, blood in stool and melena.  Genitourinary: Negative for dysuria, urgency, frequency, hematuria and flank pain.  Musculoskeletal: Negative  for myalgias, back pain, joint pain and falls.  Skin: Negative for itching and rash.  Neurological: Negative for dizziness, tingling, tremors, sensory change, speech change, focal weakness, seizures, loss of consciousness, weakness and headaches.  Endo/Heme/Allergies: Negative for environmental allergies and polydipsia. Does not bruise/bleed easily.  SUBJECTIVE:  Report pain to Left foot (improved after IV morphine) Denies chest pain, SOB, cough, abdominal pain, N/V/D, fever, chills, dysuria, HA On room air  VITAL SIGNS: Temp:  [98.2 F (36.8 C)-98.8 F (37.1 C)] 98.5 F (36.9 C) (08/27 0946) Pulse Rate:  [84-130] 100 (08/27 1500) Resp:  [13-24] 16 (08/27 1500) BP: (109-197)/(62-131) 122/84 (08/27 1500) SpO2:  [94 %-100 %] 100 % (08/27 1500) Weight:  [87.2 kg] 87.2 kg (08/27 0500)  PHYSICAL EXAMINATION: General: Acutely ill-appearing female, sitting in bed, on room air, no acute distress Neuro: Awake, alert and oriented x4, follows commands, no focal deficits, speech clear HEENT: Atraumatic, normocephalic, neck supple, no JVD Cardiovascular: Regular rate and rhythm, S1-S2, no murmurs, rubs, gallops; unable to palpate left DP & left PT pulses Lungs: Clear to auscultation bilaterally, even, nonlabored, normal effort Abdomen: Soft, nondistended, nontender, no guarding or rebound tenderness, bowel sounds positive x4 Musculoskeletal: Normal bulk and tone, no edema, no clubbing Skin: Warm and dry.  Gangrenous changes noted to left first toe       Recent Labs  Lab 03/06/20 1539 03/07/20 0121  NA 140 137  K 4.0 3.5  CL 105 105  CO2 26 23  BUN 10 12  CREATININE 0.59 0.62  GLUCOSE 95 126*   Recent Labs  Lab 03/06/20 1539 03/06/20 2022 03/07/20 0121  HGB 13.1 12.9 11.8*  HCT 39.3 38.7 37.4  WBC 7.0 6.6 8.1  PLT 337 316 278   PERIPHERAL VASCULAR CATHETERIZATION  Result Date: 03/07/2020 See op note  PERIPHERAL VASCULAR CATHETERIZATION  Result Date: 03/06/2020 See op  note  DG Chest Port 1 View  Result Date: 03/07/2020 CLINICAL DATA:  Peripheral vascular disease EXAM: PORTABLE CHEST 1 VIEW COMPARISON:  None. FINDINGS: Lung volumes are small, but are symmetric and are clear. No pneumothorax or pleural effusion. Cardiac size within normal limits. Pulmonary vascularity is normal. No acute bone abnormality. IMPRESSION: No acute process in the chest. Electronically Signed   By: Helyn Numbers MD   On: 03/07/2020 03:27   DG Foot Complete Left  Result Date: 03/07/2020 CLINICAL DATA:  Left great toe ulcer. EXAM: LEFT FOOT - COMPLETE 3+ VIEW COMPARISON:  Left foot x-rays dated January 23, 2020. FINDINGS: No acute fracture or dislocation. No bony destruction or periosteal reaction. Joint spaces are preserved. Bone mineralization is normal. Unchanged mild dorsal forefoot soft tissue swelling. IMPRESSION: 1. Unchanged mild dorsal forefoot soft tissue swelling. No acute osseous abnormality. Electronically Signed   By: Obie Dredge M.D.   On: 03/07/2020 15:06    ASSESSMENT / PLAN:  Left Lower Extremity PAD w/ Rest pain & Gangrenous Changes to Left First Toe -Vascular Surgery consulted, appreciate input ~ follow up recommendations -Heparin gtt -Pt to undergo urgent Angiogram on 03/06/2020 with Lysis catheter placement for indwelling of tPA overnight and repeat Angiogram on 03/07/2020 -Pain Control -Precedex if needed    Progressively Worsening Chronic Wound to Left First Toe (Gangrenous) -Monitor fever curve -Trend WBC's -Check Procalcitonin ~ if elevated will start empiric antibiotics -Follow cultures as above -Podiatry consulted to see if toe is salvageable, appreciate input ~ follow up recommendations -Wound care   Hypertension -Continuous cardiac monitoring -Maintain MAP >65 -Prn Hydralazine   Diabetes Mellitus -CBG's -SSI -Follow ICU Hypo/hyperglycemia protocol -Hold home Metformin -Check Hgb A1c in the a.m.   BEST PRACTICES DISPOSITION:  STEPDOWN GOALS OF CARE: FULL CODE VTE PROPHYLAXIS/ANTICOAGULATION: HEPARIN GTT CONSULTS: VASCULAR SURGERY, PODIATRY  UPDATES: UPDATED PT AT BEDSIDE 03/06/20  Critical care provider statement:    Critical care time (minutes):  33   Critical care time was exclusive of:  Separately billable procedures and  treating other patients   Critical care was necessary to treat or prevent imminent or  life-threatening deterioration of the following conditions:  critical limb ischemia, hyperglycemia   Critical care was time spent personally by me on the following  activities:  Development of treatment plan with patient or surrogate,  discussions with consultants, evaluation of patient's response to  treatment, examination of patient, obtaining history from patient or  surrogate, ordering and performing treatments and interventions, ordering  and review of laboratory studies and re-evaluation of patient's condition   I assumed direction of critical care for this patient from another  provider in my specialty: no     Vida Rigger, M.D.  Pulmonary & Critical Care Medicine  [facility]    03/07/2020, 4:09 PM

## 2020-03-07 NOTE — Progress Notes (Signed)
ANTICOAGULATION CONSULT NOTE - Follow Up Consult  Pharmacy Consult for heparin infusion  Indication: PAD/Peripheral artery occlusion  No Known Allergies  Patient Measurements: Height: 5\' 5"  (165.1 cm) Weight: 81.6 kg (180 lb) IBW/kg (Calculated) : 57 Heparin Dosing Weight: 74kg  Vital Signs: Temp: 98.8 F (37.1 C) (08/27 0000) Temp Source: Oral (08/27 0000) BP: 172/107 (08/27 0300) Pulse Rate: 121 (08/27 0300)  Labs: Recent Labs    03/06/20 1539 03/06/20 1539 03/06/20 1602 03/06/20 1630 03/06/20 2022 03/07/20 0121  HGB 13.1   < >  --   --  12.9 11.8*  HCT 39.3  --   --   --  38.7 37.4  PLT 337  --   --   --  316 278  APTT  --   --  30  --   --  34  LABPROT  --   --  12.9  --   --   --   INR  --   --  1.0  --   --   --   HEPARINUNFRC  --   --   --  0.61 0.59 0.25*  CREATININE 0.59  --   --   --   --  0.62   < > = values in this interval not displayed.    Estimated Creatinine Clearance: 97.6 mL/min (by C-G formula based on SCr of 0.62 mg/dL).  Assessment: Pharmacy consulted for heparin infusion dosing and monitoring for 39 yo female for PAD/Peripheral artery occlusion.  Patients last reported dose of apixaban was 8/25 AM.   8/26 1900 Patient is S/P catheter placement and thrombolytic therapy. Catheter left in place for continuous infusion.   Goal of Therapy:  Heparin level 0.2 - 0.5 units/ml aPTT 50 - 85 seconds Monitor platelets by anticoagulation protocol: Yes   Plan:  08/27 @ 0130 aPTT 34 seconds subtherapeutic, HL 0.25 inaccurate d/t eliquis, non correlative w/ aPTT. Will increase rate to 700 units/hr and will recheck aPTT at 0900, CBC trending down will continue to monitor.  9/27, PharmD, BCPS Clinical Pharmacist 03/07/2020 3:33 AM

## 2020-03-07 NOTE — Op Note (Signed)
[facility]  Percutaneous Study/Intervention Procedural Note   Date of Surgery: 03/07/2020  Surgeon(s):Tiberius Loftus    Assistants:none  Pre-operative Diagnosis: PAD with rest [name], acute on chronic ischemia status post overnight thrombolytic therapy  Post-operative diagnosis:  Same  Procedure(s) Performed:             1.   Left lower extremity angiogram             2.  Catheter placement into left posterior tibial artery from right femoral approach             3.   Mechanical thrombectomy of the left SFA, popliteal artery, tibioperoneal trunk, and posterior tibial arteries with penumbra cat 6 device             4.  Percutaneous transluminal angioplasty of the posterior tibial artery with 2.5 mm diameter angioplasty balloon             5.   Percutaneous transluminal angioplasty of the tibioperoneal trunk and distal popliteal artery with 4 mm diameter Lutonix drug-coated angioplasty balloon  6.  Viabahn covered stent placement to the tibioperoneal trunk with 5 mm diameter by 2.5 cm length stent for residual stenosis and thrombus after above procedures  7.  Viabahn stent placement to the mid SFA at the leading edge of the previously placed stent for residual thrombus and stenosis after thrombectomy using a 6 mm diameter by 5 cm length stent             8.  StarClose closure device right femoral artery  EBL: 150 cc  Contrast: 65 cc  Fluoro Time: 6.5 minutes  Moderate Conscious Sedation Time: approximately 60 minutes using 4 mg of Versed and 200 mcg of Fentanyl              Indications:  Patient is a 39 y.o.female with limb threatening ischemia with acute on chronic ischemia with rest [name] of the left leg status post overnight thrombolytic therapy. The patient is brought in for angiography for further evaluation and potential treatment.  Due to the limb threatening nature of the situation, angiogram was performed for attempted limb salvage. The  patient is aware that if the procedure fails, amputation would be expected.  The patient also understands that even with successful revascularization, amputation may still be required due to the severity of the situation.  Risks and benefits are discussed and informed consent is obtained.   Procedure:  The patient was identified and appropriate procedural time out was performed.  The patient was then placed supine on the table and prepped and draped in the usual sterile fashion. Moderate conscious sedation was administered during a face to face encounter with the patient throughout the procedure with my supervision of the RN administering medicines and monitoring the patient's vital signs, pulse oximetry, telemetry and mental status throughout from the start of the procedure until the patient was taken to the recovery room.  The existing thrombolytic catheter was removed over a V 18 wire which was parked in the distal posterior tibial artery.  Selective left lower extremity angiogram was then performed. This demonstrated continued occlusion of the SFA at the top of the previously placed stent.  On initial imaging, there remained extremely poor runoff distally. It was felt that it was in the patient's best interest to proceed with intervention after these images to avoid a second procedure and a larger amount of contrast and fluoroscopy based off of the findings  from the initial angiogram. The patient was systemically heparinized and I elected to use the penumbra cat 6 device mechanical thrombectomy.  This was brought onto the field and 2 passes were made from the mid SFA through the previously placed stents down into the popliteal artery and then advanced through the stents into the tibioperoneal trunk and the proximal portion of the posterior tibial artery.  Several significant chunks of thrombus were removed but flow remained very sluggish.  With this, there was thrombus and stenosis at the leading edge of the  previously placed stent in the mid SFA creating moderate stenosis in the 50-60% range.  At the distal edge of the previously placed stent and extending down into the tibioperoneal trunk there was stenosis and thrombus creating at least 70% stenosis.  The posterior tibial artery was nearly occluded at its origin but then was the best runoff distally although it remains small with areas of thrombus and stenosis distally.  I then used a 2.5 mm diameter by 30 cm length angioplasty balloon inflated this from the ankle up to the origin of the posterior tibial artery up to 10 atm for 1 minute.  A 4 mm diameter by 10 cm length Lutonix drug-coated angioplasty balloon was then used to treat the origin of the posterior tibial artery, the tibioperoneal trunk, the most distal popliteal artery inflating this to 8 atm for 1 minute.  Completion imaging showed significant improvement in the posterior tibial artery, but the tibioperoneal trunk continued to have a chunk of thrombus and stenosis at the distal edge of the previously placed stent.  I extended a 5 mm diameter by 2.5 cm length Viabahn stent distally into the tibioperoneal trunk post dilating this with a 4 mm balloon with marked improvement and less than 10% residual stenosis.  For the lesion at the leading edge of the stent in the mid SFA, I selected a 6 mm diameter by 5 cm length Viabahn stent and then postdilated this with a 5 mm diameter Lutonix drug-coated angioplasty balloon.  There is less than 10% residual stenosis with this proximally.  There remained some spasm distally in the posterior tibial artery but this did not appear continuous to the foot and filled collaterals into the peroneal circulation distally as well.  The sheath was removed and StarClose closure device was deployed in the right femoral artery with excellent hemostatic result. The patient was taken to the recovery room in stable condition having tolerated the procedure well.  Findings:                           Left lower Extremity:  Continued occlusion of the SFA at the top of the previously placed stent.  On initial imaging, there remained extremely poor runoff distally.   Disposition: Patient was taken to the recovery room in stable condition having tolerated the procedure well.  Complications: None  Jocelyn [name] 03/07/2020 8:52 AM   This note was created with Dragon Medical transcription system. Any errors in dictation are purely unintentional.

## 2020-03-07 NOTE — Consult Note (Signed)
ORTHOPAEDIC CONSULTATION  REQUESTING PHYSICIAN: Erin Fulling, MD  Chief Complaint: Gangrene left great toe  HPI: Jocelyn [name] is a 39 y.o. female who complains of worsening ulceration to her left great toe.  Has been seen by vascular surgery for some time.  Experience worsening pain and poor circulation.  Underwent angio and revascularization today.  Consultation for opinion in regards to ulcer to the great toe.  She states this has been present for 6 weeks.  It hurts whenever she is ambulating.  Typically she has numbness to her foot.  Past Medical History:  Diagnosis Date  . Diabetes mellitus, type 2 (HCC) 12/17/2019  . IDA (iron deficiency anemia) 12/17/2019   Past Surgical History:  Procedure Laterality Date  . APPENDECTOMY    . LOWER EXTREMITY ANGIOGRAPHY Left 01/11/2020   Procedure: Lower Extremity Angiography;  Surgeon: Annice Needy, MD;  Location: [facility] INVASIVE CV [facility];  Service: Cardiovascular;  Laterality: Left;  . LOWER EXTREMITY ANGIOGRAPHY Left 03/06/2020   Procedure: Lower Extremity Angiography;  Surgeon: Annice Needy, MD;  Location: [facility] INVASIVE CV [facility];  Service: Cardiovascular;  Laterality: Left;  . LOWER EXTREMITY ANGIOGRAPHY Left 03/07/2020   Procedure: Lower Extremity Angiography;  Surgeon: Annice Needy, MD;  Location: [facility] INVASIVE CV LAB;  Service: Cardiovascular;  Laterality: Left;   Social History   Socioeconomic History  . Marital status: Legally Separated    Spouse name: Not on file  . Number of children: Not on file  . Years of education: Not on file  . Highest education level: Not on file  Occupational History  . Not on file  Tobacco Use  . Smoking status: Current Every Day Smoker    Packs/day: 0.50    Types: Cigarettes  . Smokeless tobacco: Never Used  Vaping Use  . Vaping Use: Never used  Substance and Sexual Activity  . Alcohol use: Yes  . Drug use: Never  . Sexual activity: Not on file  Other Topics Concern  . Not on file  Social History  Narrative  . Not on file   Social Determinants of Health   Financial Resource Strain:   . Difficulty of Paying Living Expenses: Not on file  Food Insecurity:   . Worried About Programme researcher, broadcasting/film/video in the Last Year: Not on file  . Ran Out of Food in the Last Year: Not on file  Transportation Needs:   . Lack of Transportation (Medical): Not on file  . Lack of Transportation (Non-Medical): Not on file  Physical Activity:   . Days of Exercise per Week: Not on file  . Minutes of Exercise per Session: Not on file  Stress:   . Feeling of Stress : Not on file  Social Connections:   . Frequency of Communication with Friends and Family: Not on file  . Frequency of Social Gatherings with Friends and Family: Not on file  . Attends Religious Services: Not on file  . Active Member of Clubs or Organizations: Not on file  . Attends Banker Meetings: Not on file  . Marital Status: Not on file   Family History  Problem Relation Age of Onset  . Hypertension Mother   . Hypertension Sister   . Hypertension Brother    No Known Allergies Prior to Admission medications   Medication Sig Start Date End Date Taking? Authorizing Provider  amLODipine (NORVASC) 5 MG tablet Take 5 mg by mouth daily. 09/06/19  Yes [provider]  apixaban (ELIQUIS) 5 MG  TABS tablet Take 1 tablet (5 mg total) by mouth 2 (two) times daily. 01/12/20  Yes Enedina Finner, MD  aspirin EC 81 MG tablet Take 1 tablet (81 mg total) by mouth daily. Swallow whole. 01/04/20 01/03/21 Yes Stegmayer, Ranae Plumber, PA-C  Ferrous Sulfate (IRON) 325 (65 Fe) MG TABS Take 1 tablet (325 mg total) by mouth daily. 12/17/19 03/06/20 Yes Sharman Cheek, MD  gabapentin (NEURONTIN) 300 MG capsule Take 300 mg by mouth 3 (three) times daily. 12/21/19  Yes [provider]  metFORMIN (GLUCOPHAGE) 500 MG tablet Take 1 tablet (500 mg total) by mouth 2 (two) times daily with a meal. 12/17/19  Yes Sharman Cheek, MD  nicotine (NICODERM CQ  - DOSED IN MG/24 HOURS) 21 mg/24hr patch Place 1 patch (21 mg total) onto the skin daily. 01/12/20  Yes Enedina Finner, MD   PERIPHERAL VASCULAR CATHETERIZATION  Result Date: 03/07/2020 See op note  PERIPHERAL VASCULAR CATHETERIZATION  Result Date: 03/06/2020 See op note  DG Chest Port 1 View  Result Date: 03/07/2020 CLINICAL DATA:  Peripheral vascular disease EXAM: PORTABLE CHEST 1 VIEW COMPARISON:  None. FINDINGS: Lung volumes are small, but are symmetric and are clear. No pneumothorax or pleural effusion. Cardiac size within normal limits. Pulmonary vascularity is normal. No acute bone abnormality. IMPRESSION: No acute process in the chest. Electronically Signed   By: Helyn Numbers MD   On: 03/07/2020 03:27    Positive ROS: All other systems have been reviewed and were otherwise negative with the exception of those mentioned in the HPI and as above.  12 point ROS was performed.  Physical Exam: General: Alert and oriented.  No apparent distress.  Vascular:  Left foot:Dorsalis Pedis:  absent Posterior Tibial:  absent  Right foot: Dorsalis Pedis:  absent Posterior Tibial:  absent  Neuro:absent  Derm: On the lateral aspect of the left great toes a necrotic ulceration full-thickness in nature.  No signs of infection at this time.  No purulence foul odor or lymphangitic streaking.  The surrounding skin has darkened discoloration consistent with early necrosis.  This extends to the great toe joint at this time.      Ortho/MS: Diffuse edema to the left foot.  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  I long discussion with the patient in regards to the necrotic tissue.  I am concerned about the surrounding darkened discoloration.  If the surrounding tissue continues to become necrotic she will likely be a candidate for  amputation.  There is some discoloration proximal to the metatarsophalangeal joint so we will have to continue to monitor this for now.  We will start Santyl dressing changes to the wound to be changed every day.  She can follow-up outpatient in 2 to 3 weeks for reevaluation.  Certainly if this becomes worse we can consider surgical intervention.  We will order foot films for further evaluation to make sure no obvious osteomyelitis or gas in the soft tissue was seen.    Irean Hong, DPM Cell [phone]   03/07/2020 12:16 PM

## 2020-03-07 NOTE — Interval H&P Note (Signed)
History and Physical Interval Note:  03/07/2020 7:49 AM  Jocelyn [name]  has presented today for surgery, with the diagnosis of Atherosclerotic disease with gangrene.  The various methods of treatment have been discussed with the patient and family. After consideration of risks, benefits and other options for treatment, the patient has consented to  Procedure(s): Lower Extremity Angiography (Left) as a surgical intervention.  The patient's history has been reviewed, patient examined, no change in status, stable for surgery.  I have reviewed the patient's chart and labs.  Questions were answered to the patient's satisfaction.     [name]

## 2020-03-08 ENCOUNTER — Encounter: Payer: Self-pay | Admitting: Internal Medicine

## 2020-03-08 DIAGNOSIS — M79606 Pain in leg, unspecified: Secondary | ICD-10-CM

## 2020-03-08 LAB — CBC
HCT: 33.2 % — ABNORMAL LOW (ref 36.0–46.0)
Hemoglobin: 11 g/dL — ABNORMAL LOW (ref 12.0–15.0)
MCH: 30.4 pg (ref 26.0–34.0)
MCHC: 33.1 g/dL (ref 30.0–36.0)
MCV: 91.7 fL (ref 80.0–100.0)
Platelets: 220 10*3/uL (ref 150–400)
RBC: 3.62 MIL/uL — ABNORMAL LOW (ref 3.87–5.11)
RDW: 16 % — ABNORMAL HIGH (ref 11.5–15.5)
WBC: 7.1 10*3/uL (ref 4.0–10.5)
nRBC: 0 % (ref 0.0–0.2)

## 2020-03-08 LAB — MAGNESIUM: Magnesium: 1.3 mg/dL — ABNORMAL LOW (ref 1.7–2.4)

## 2020-03-08 LAB — BASIC METABOLIC PANEL
Anion gap: 6 (ref 5–15)
Anion gap: 8 (ref 5–15)
BUN: 6 mg/dL (ref 6–20)
BUN: 7 mg/dL (ref 6–20)
CO2: 27 mmol/L (ref 22–32)
CO2: 24 mmol/L (ref 22–32)
Calcium: 8.2 mg/dL — ABNORMAL LOW (ref 8.9–10.3)
Calcium: 8.7 mg/dL — ABNORMAL LOW (ref 8.9–10.3)
Chloride: 105 mmol/L (ref 98–111)
Chloride: 101 mmol/L (ref 98–111)
Creatinine, Ser: 0.48 mg/dL (ref 0.44–1.00)
Creatinine, Ser: 0.74 mg/dL (ref 0.44–1.00)
GFR calc Af Amer: >60 mL/min (ref >60)
GFR calc Af Amer: >60 mL/min (ref >60)
GFR calc non Af Amer: >60 mL/min (ref >60)
GFR calc non Af Amer: >60 mL/min (ref >60)
Glucose, Bld: 104 mg/dL — ABNORMAL HIGH (ref 70–99)
Glucose, Bld: 154 mg/dL — ABNORMAL HIGH (ref 70–99)
Potassium: 3.2 mmol/L — ABNORMAL LOW (ref 3.5–5.1)
Potassium: 3.3 mmol/L — ABNORMAL LOW (ref 3.5–5.1)
Sodium: 138 mmol/L (ref 135–145)
Sodium: 133 mmol/L — ABNORMAL LOW (ref 135–145)

## 2020-03-08 LAB — HEMOGLOBIN A1C
Hgb A1c MFr Bld: 5 % (ref 4.8–5.6)
Mean Plasma Glucose: 96.8 mg/dL

## 2020-03-08 LAB — GLUCOSE, CAPILLARY: Glucose-Capillary: 108 mg/dL — ABNORMAL HIGH (ref 70–99)

## 2020-03-08 MED ORDER — POTASSIUM CHLORIDE CRYS ER 20 MEQ PO TBCR
40.0000 meq | EXTENDED_RELEASE_TABLET | Freq: Once | ORAL | Status: AC
  Administered 2020-03-08: 40 meq via ORAL
  Filled 2020-03-08: qty 2

## 2020-03-08 MED ORDER — MAGNESIUM SULFATE 4 GM/100ML IV SOLN
4.0000 g | Freq: Once | INTRAVENOUS | Status: AC
  Administered 2020-03-08: 4 g via INTRAVENOUS
  Filled 2020-03-08: qty 100

## 2020-03-08 NOTE — Progress Notes (Signed)
Pt transferred to RM 226 at this time. VSS prior to transfer. Report given to receiving RN.

## 2020-03-08 NOTE — Progress Notes (Signed)
Pt refused CBG check at this time.  °

## 2020-03-08 NOTE — Progress Notes (Signed)
Vascular Surgery  Daily Progress Note   Subjective:   Patient presented to the [facility] emergency department  with progressively worsening pain to the left foot.  Also notes the wound to her first left toe has worsened as well.  Denies any fever, nausea vomiting.  Denies any shortness of breath or chest pain.  Endovascular intervention with thrombectomy, PTA and stenting performed yesterday She feels better . Pain is almost resolved.she can move her foot and toes better  Objective: Vitals:   03/08/20 0400 03/08/20 0500 03/08/20 0600 03/08/20 0700  BP: (!) 155/97 (!) 131/104 (!) 146/89 116/79  Pulse: 93 92 (!) 101 96  Resp: 15 16 15 16   Temp:      TempSrc:      SpO2: 99% 97% 98% 94%  Weight:  88.8 kg    Height:        Intake/Output Summary (Last 24 hours) at 03/08/2020 0754 Last data filed at 03/08/2020 0200 Gross per 24 hour  Intake 710.67 ml  Output 350 ml  Net 360.67 ml    Physical Exam: A&Ox3, NAD CV: RRR Pulmonary: CTA Bilaterally Abdomen: Soft, Nontender, Nondistended Vascular:  Left Lower Extremity: Thigh soft.  Calf soft.  Extremities warm distally toes. Biphasic DP and PT  Laboratory: CBC    Component Value Date/Time   WBC 7.1 03/08/2020 0416   HGB 11.0 (L) 03/08/2020 0416   HGB 11.7 (L) 04/24/2013 1557   HCT 33.2 (L) 03/08/2020 0416   HCT 35.6 04/24/2013 1557   PLT 220 03/08/2020 0416   PLT 316 04/24/2013 1557   BMET    Component Value Date/Time   NA 138 03/08/2020 0416   NA 134 (L) 04/24/2013 1557   K 3.2 (L) 03/08/2020 0416   K 4.4 04/24/2013 1557   CL 105 03/08/2020 0416   CL 105 04/24/2013 1557   CO2 27 03/08/2020 0416   CO2 24 04/24/2013 1557   GLUCOSE 104 (H) 03/08/2020 0416   GLUCOSE 103 (H) 04/24/2013 1557   BUN 6 03/08/2020 0416   BUN 7 04/24/2013 1557   CREATININE 0.48 03/08/2020 0416   CREATININE 0.51 (L) 04/24/2013 1557   CALCIUM 8.2 (L) 03/08/2020 0416   CALCIUM 9.1 04/24/2013 1557   GFRNONAA >60  03/08/2020 0416   GFRNONAA >60 04/24/2013 1557   GFRAA >60 03/08/2020 0416   GFRAA >60 04/24/2013 1557   Assessment/Planning:  1) 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  If patient is able to get coupon or supply for xarelto or eliquis, she could be discharged today with f/u in office in 7-10 d Continue wound care per podiatry   2) Tobacco Abuse: Patient notes that she has "cut back" on smoking tobacco however does still actively engage in a daily basis. We had a discussion for approximately three minutes regarding the absolute need for smoking cessation due to the deleterious nature of tobacco on the vascular system. We discussed the tobacco use would diminish patency of any intervention, and likely significantly worsen progression of disease.   4) Diabetes: On appropriate medications Unsure if controlled Will order hemoglobin A1c in a.m.

## 2020-03-08 NOTE — Progress Notes (Signed)
Pt refused CBG check  

## 2020-03-08 NOTE — Progress Notes (Signed)
OT Cancellation Note  Patient Details Name: Jocelyn [name] MRN: [id number] DOB: March 13, 1981   Cancelled Treatment:    Reason Eval/Treat Not Completed: OT screened, no needs identified, will sign off. Per PT note, pt is Independent c mobility. Upon arrival, pt endorses no ADL deficits, reports being at functional baseline, endorses no concerns at this time. No skilled acute OT needs identified, will sign off. Please re-consult if new needs arise.   Kathie Dike, M.S. OTR/L  03/08/20, 2:37 PM  ascom [phone]

## 2020-03-08 NOTE — Consult Note (Addendum)
PHARMACY CONSULT NOTE - Initial  Pharmacy Consult for Electrolyte Monitoring and Replacement   Recent Labs: Potassium (mmol/L)  Date Value  03/08/2020 3.2 (L)  04/24/2013 4.4   Magnesium (mg/dL)  Date Value  24/03/7352 1.3 (L)   Calcium (mg/dL)  Date Value  29/92/4268 8.2 (L)   Calcium, Total (mg/dL)  Date Value  34/19/6222 9.1   Albumin (g/dL)  Date Value  97/98/9211 3.9  04/24/2013 4.1   Sodium (mmol/L)  Date Value  03/08/2020 138  04/24/2013 134 (L)   Assessment: 39 year old female presenting with LLE pain. PMH includes PAD, diabetes, HTN, and LLE angiogram w/ stent placement 01/11/20. Current K 3.2, Mg 1.3. Pharmacy consulted for monitoring and replacement.   Goal of Therapy:  Electrolytes WNL  Plan:  - Mg 4 g IV x 1 dose - KCl 40 mEq PO x 2 doses - f/u electrolyte levels with AM labs  Reatha Armour, PharmD Pharmacy Resident  03/08/2020 10:54 AM

## 2020-03-08 NOTE — Progress Notes (Signed)
Pt resting in bed with eyes closed. Pt denies pain throughout shift. Vascular wanted to discharge pt today but coupons for anticoags were unable to be gotten over the weekend. Will reassess that with case management on Monday. Pt is waiting for bed on med-surg unit. Pt has been refusing CBG and insulin and denies she is a diabetic. Vitals have been stable throughout day.

## 2020-03-08 NOTE — Progress Notes (Signed)
Pt refused afternoon CBG check.

## 2020-03-08 NOTE — Progress Notes (Signed)
Physical Therapy Evaluation Patient Details Name: Jocelyn [name] MRN: [id number] DOB: 07/26/1980 Today's Date: 03/08/2020   History of Present Illness  Jocelyn [name] is a 39 year old female with a past medical history significant for peripheral arterial disease, diabetes mellitus, hypertension, and recent left lower extremity angiogram with stent placement to the SFA on January 11, 2020.  She presentED to [facility] ED on 03/06/2020 due to resting pain of the left lower extremity and foot, along with worsening chronic wound to the left first toe. She denied chest pain, shortness of breath, cough, abdominal pain, nausea, vomiting, diarrhea, dysuria, headache, palpitations, fever, chills. Upon presentation to the ED her vitals included: Temperature 98.4 orally, respiratory rate 20, pulse 105, blood pressure 172/125, and SPO2 99% on room air.  ED provider was unable to Doppler pulses in the left dorsalis pedis with very minimal dopplerable posterior tibial pulse. Vascular surgery was contacted who recommended placing patient on heparin drip, and will be taking the patient urgently to the vascular lab for angiogram and placement of lysis catheter and infusion of TPA overnight Lab work was unremarkable (CBC & CMP), lactic acid 1.0. Her COVID-10 PCR is negative. PCCM was asked to admit the pt to the [facility] unit for close monitoring while tPA infusing overnight.   Clinical Impression  Pt admitted with above diagnosis. Pt currently with functional limitations due to the deficits listed below (see PT Problem List).  Prior to admission pt was independent with ambulation without assistive device. No falls in the last 12 months. 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 Independent with ADLs/IADLs. Lives with her adult children and her newly born granddaughter. She is independent with all bed mobility and transfers. Good speed and sequencing with transfers and pt is safe once in standing without need for external  assistance. She is able to ambulate a full lap around RN station in the CCU. Mildly antalgic gait on L foot but good stability and speed. She is able to naturally turn head to scan without any issues with balance. No external assist required by therapist. Pt has no need for therapy at this time or after discharge. No DME needs. She is safe to return home with her family once medically appropriate.      Follow Up Recommendations No PT follow up    Equipment Recommendations  None recommended by PT    Recommendations for Other Services       Precautions / Restrictions Precautions Precautions: None Restrictions Weight Bearing Restrictions: No      Mobility  Bed Mobility Overal bed mobility: Independent             General bed mobility comments: Good speed and sequencing  Transfers Overall transfer level: Independent Equipment used: None             General transfer comment: Good speed and sequencing. Pt is safe once in standing without need for external assistance  Ambulation/Gait Ambulation/Gait assistance: Independent Gait Distance (Feet): 200 Feet Assistive device: None       General Gait Details: Pt is able to ambulate a full lap around RN station in the CCU. Mildly antalgic gait on L foot but good stability and speed. She is able to naturally turn head to scan without any issues with balance. No external assist required by therapist  Stairs            Wheelchair Mobility    Modified Rankin (Stroke Patients Only)       Balance  Overall balance assessment: Independent                                           Pertinent Vitals/Pain Pain Assessment: 0-10 Pain Score: 4  Pain Location: LLE Pain Intervention(s): Monitored during session    Home Living Family/patient expects to be discharged to:: Private residence Living Arrangements: Children Available Help at Discharge: Family Type of Home: House Home Access: Stairs to  enter Entrance Stairs-Rails: Left Entrance Stairs-Number of Steps: 3 Home Layout: One level Home Equipment: None      Prior Function Level of Independence: Independent         Comments: Pt independent with ambulation without assistive device. No falls in the last 12 months. 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 Independent with ADLs/IADLs     Hand Dominance   Dominant Hand: Right    Extremity/Trunk Assessment   Upper Extremity Assessment Upper Extremity Assessment: Overall WFL for tasks assessed    Lower Extremity Assessment Lower Extremity Assessment: Overall WFL for tasks assessed       Communication   Communication: No difficulties  Cognition Arousal/Alertness: Awake/alert Behavior During Therapy: WFL for tasks assessed/performed Overall Cognitive Status: Within Functional Limits for tasks assessed                                        General Comments      Exercises     Assessment/Plan    PT Assessment Patent does not need any further PT services  PT Problem List         PT Treatment Interventions      PT Goals (Current goals can be found in the Care Plan section)  Acute Rehab PT Goals PT Goal Formulation: All assessment and education complete, DC therapy    Frequency     Barriers to discharge        Co-evaluation               AM-PAC PT "6 Clicks" Mobility  Outcome Measure Help needed turning from your back to your side while in a flat bed without using bedrails?: None Help needed moving from lying on your back to sitting on the side of a flat bed without using bedrails?: None Help needed moving to and from a bed to a chair (including a wheelchair)?: None Help needed standing up from a chair using your arms (e.g., wheelchair or bedside chair)?: None Help needed to walk in hospital room?: None Help needed climbing 3-5 steps with a railing? : None 6 Click Score: 24    End of Session Equipment  Utilized During Treatment: Gait belt Activity Tolerance: Patient tolerated treatment well Patient left: in bed Nurse Communication: Mobility status PT Visit Diagnosis: Unsteadiness on feet (R26.81)    Time: 0277-4128 PT Time Calculation (min) (ACUTE ONLY): 14 min   Charges:   PT Evaluation $PT Eval Low Complexity: 1 Low          Navdeep Halt D Leandrew Keech PT, DPT, GCS   Jamal Pavon 03/08/2020, 1:12 PM

## 2020-03-08 NOTE — Progress Notes (Addendum)
39 y/o F w/ PMH of PAD who presented w/ LLE ischemia secondary to a stent thrombosis which has resolved after thrombectomy by vascular surgery. Hospitalist service will pick up pt tomorrow.  This is a non-billable note.

## 2020-03-08 NOTE — Progress Notes (Signed)
Name: Jocelyn [name] MRN: [id number] DOB: 1981-05-12    ADMISSION DATE:  03/06/2020 CONSULTATION DATE:  03/06/2020  REFERRING MD :  Dr. Lenard Lance  CHIEF COMPLAINT:  Left lower extremity pain / Left foot pain  BRIEF PATIENT DESCRIPTION:  39 y.o. Female with a PMH significant for PAD, DM, HTN s/p LLE angiogram w/ stent placement on 01/11/2020. She is admitted 03/06/2020 due to Rest pain of LLE concerning for reocclusion/rethrombosis of previous stent and Gangrenous chronic wound of left great toe.  Vascular Surgery and Podiatry consulted.  Plan for urgent Angiogram and lysis catheter for infusion of tPA overnight with repeat Angiogram tomorrow morning 03/07/2020.  SIGNIFICANT EVENTS  8/26: Presents to ED; PCCM asked to admit to [facility] 8/27: Vascular Surgery taking for urgent Angiogram with plan for Lysis catheter for tPA overnight.  STUDIES:  N/A  CULTURES: SARS-CoV-2 PCR 8/26>>negative  ANTIBIOTICS: N/A  HISTORY OF PRESENT ILLNESS:   Jocelyn [name] is a 39 year old female with a past medical history significant for peripheral arterial disease, diabetes mellitus, hypertension, and recent left lower extremity angiogram with stent placement to the SFA on January 11, 2020.  She presents to [facility] ED on 03/06/2020 due to rest pain of the left lower extremity and foot, along with worsening chronic wound to the left first toe. She denies chest pain, shortness of breath, cough, abdominal pain, nausea, vomiting, diarrhea, dysuria, headache, palpitations, fever, chills.  Upon presentation to the ED her vitals included: Temperature 98.4 orally, respiratory rate 20, pulse 105, blood pressure 172/125, and SPO2 99% on room air.  ED provider was unable to Doppler pulses in the left dorsalis pedis with very minimal dopplerable posterior tibial pulse.  Vascular surgery was contacted who recommended placing patient on heparin drip, and will be taking the patient urgently to the vascular lab for angiogram and  placement of lysis catheter and infusion of TPA overnight with plans for repeat angiogram tomorrow 03/07/2020.  Podiatry has been consulted to see if toe is salveagable.  Lab work is unremarkable (CBC & CMP), lactic acid 1.0.  Procalcitonin is pending.  Her COVID-10 PCR is negative.  PCCM is asked to admit the pt to the [facility] unit for close monitoring while tPA infusing overnight.   PAST MEDICAL HISTORY :   has a past medical history of IDA (iron deficiency anemia) (12/17/2019).  has a past surgical history that includes Appendectomy; Lower Extremity Angiography (Left, 01/11/2020); Lower Extremity Angiography (Left, 03/06/2020); and Lower Extremity Angiography (Left, 03/07/2020). Prior to Admission medications   Medication Sig Start Date End Date Taking? Authorizing Provider  amLODipine (NORVASC) 5 MG tablet Take 5 mg by mouth daily. 09/06/19   [provider]  amoxicillin-clavulanate (AUGMENTIN) 875-125 MG tablet Take 1 tablet by mouth 2 (two) times daily. 02/07/20   Georgiana Spinner, NP  apixaban (ELIQUIS) 5 MG TABS tablet Take 1 tablet (5 mg total) by mouth 2 (two) times daily. 01/12/20   Enedina Finner, MD  aspirin EC 81 MG tablet Take 1 tablet (81 mg total) by mouth daily. Swallow whole. 01/04/20 01/03/21  Stegmayer, Ranae Plumber, PA-C  Ferrous Sulfate (IRON) 325 (65 Fe) MG TABS Take 1 tablet (325 mg total) by mouth daily. 12/17/19 02/15/20  Sharman Cheek, MD  gabapentin (NEURONTIN) 300 MG capsule Take 300 mg by mouth 3 (three) times daily. 12/21/19   [provider]  lisinopril (ZESTRIL) 20 MG tablet Take 20 mg by mouth daily. 09/06/19   [provider]  metFORMIN (GLUCOPHAGE) 500 MG tablet Take  1 tablet (500 mg total) by mouth 2 (two) times daily with a meal. 12/17/19   Sharman Cheek, MD  nicotine (NICODERM CQ - DOSED IN MG/24 HOURS) 21 mg/24hr patch Place 1 patch (21 mg total) onto the skin daily. 01/12/20   Enedina Finner, MD   No Known Allergies  FAMILY HISTORY:  family  history includes Hypertension in her brother, mother, and sister. SOCIAL HISTORY:  reports that she has been smoking cigarettes. She has been smoking about 0.50 packs per day. She has never used smokeless tobacco. She reports current alcohol use. She reports that she does not use drugs.     REVIEW OF SYSTEMS:  Positives in BOLD Constitutional: Negative for fever, chills, weight loss, malaise/fatigue and diaphoresis.  HENT: Negative for hearing loss, ear pain, nosebleeds, congestion, sore throat, neck pain, tinnitus and ear discharge.   Eyes: Negative for blurred vision, double vision, photophobia, pain, discharge and redness.  Respiratory: Negative for cough, hemoptysis, sputum production, shortness of breath, wheezing and stridor.   Cardiovascular: Negative for chest pain, palpitations, orthopnea, claudication, +leg swelling, + rest pain, and PND.  Gastrointestinal: Negative for heartburn, nausea, vomiting, abdominal pain, diarrhea, constipation, blood in stool and melena.  Genitourinary: Negative for dysuria, urgency, frequency, hematuria and flank pain.  Musculoskeletal: Negative for myalgias, back pain, joint pain and falls.  Skin: Negative for itching and rash.  Neurological: Negative for dizziness, tingling, tremors, sensory change, speech change, focal weakness, seizures, loss of consciousness, weakness and headaches.  Endo/Heme/Allergies: Negative for environmental allergies and polydipsia. Does not bruise/bleed easily.  SUBJECTIVE:  Report pain to Left foot (improved after IV morphine) Denies chest pain, SOB, cough, abdominal pain, N/V/D, fever, chills, dysuria, HA On room air  VITAL SIGNS: Temp:  [98.6 F (37 C)-98.7 F (37.1 C)] 98.6 F (37 C) (08/28 0800) Pulse Rate:  [88-130] 103 (08/28 1000) Resp:  [13-22] 16 (08/28 1000) BP: (116-173)/(74-113) 138/85 (08/28 1000) SpO2:  [89 %-100 %] 99 % (08/28 1000) Weight:  [88.8 kg] 88.8 kg (08/28 0500)  PHYSICAL  EXAMINATION: General: Acutely ill-appearing female, sitting in bed, on room air, no acute distress Neuro: Awake, alert and oriented x4, follows commands, no focal deficits, speech clear HEENT: Atraumatic, normocephalic, neck supple, no JVD Cardiovascular: Regular rate and rhythm, S1-S2, no murmurs, rubs, gallops; unable to palpate left DP & left PT pulses Lungs: Clear to auscultation bilaterally, even, nonlabored, normal effort Abdomen: Soft, nondistended, nontender, no guarding or rebound tenderness, bowel sounds positive x4 Musculoskeletal: Normal bulk and tone, no edema, no clubbing Skin: Warm and dry.  Gangrenous changes noted to left first toe-dressed     Recent Labs  Lab 03/06/20 1539 03/07/20 0121 03/08/20 0416  NA 140 137 138  K 4.0 3.5 3.2*  CL 105 105 105  CO2 26 23 27   BUN 10 12 6   CREATININE 0.59 0.62 0.48  GLUCOSE 95 126* 104*   Recent Labs  Lab 03/06/20 2022 03/07/20 0121 03/08/20 0416  HGB 12.9 11.8* 11.0*  HCT 38.7 37.4 33.2*  WBC 6.6 8.1 7.1  PLT 316 278 220   PERIPHERAL VASCULAR CATHETERIZATION  Result Date: 03/07/2020 See op note  PERIPHERAL VASCULAR CATHETERIZATION  Result Date: 03/06/2020 See op note  DG Chest Port 1 View  Result Date: 03/07/2020 CLINICAL DATA:  Peripheral vascular disease EXAM: PORTABLE CHEST 1 VIEW COMPARISON:  None. FINDINGS: Lung volumes are small, but are symmetric and are clear. No pneumothorax or pleural effusion. Cardiac size within normal limits. Pulmonary vascularity is normal. No  acute bone abnormality. IMPRESSION: No acute process in the chest. Electronically Signed   By: Helyn Numbers MD   On: 03/07/2020 03:27   DG Foot Complete Left  Result Date: 03/07/2020 CLINICAL DATA:  Left great toe ulcer. EXAM: LEFT FOOT - COMPLETE 3+ VIEW COMPARISON:  Left foot x-rays dated January 23, 2020. FINDINGS: No acute fracture or dislocation. No bony destruction or periosteal reaction. Joint spaces are preserved. Bone mineralization is  normal. Unchanged mild dorsal forefoot soft tissue swelling. IMPRESSION: 1. Unchanged mild dorsal forefoot soft tissue swelling. No acute osseous abnormality. Electronically Signed   By: Obie Dredge M.D.   On: 03/07/2020 15:06    ASSESSMENT / PLAN:  Left Lower Extremity PAD w/ Rest pain & Gangrenous Changes to Left First Toe -Vascular Surgery consulted, appreciate input ~ follow up recommendations -Heparin gtt -Pt to undergo urgent Angiogram on 03/06/2020 with Lysis catheter placement for indwelling of tPA overnight and repeat Angiogram on 03/07/2020 -Pain Control -Precedex if needed    Progressively Worsening Chronic Wound to Left First Toe (Gangrenous) -Monitor fever curve -Trend WBC's -Check Procalcitonin ~ if elevated will start empiric antibiotics -Follow cultures as above -Podiatry consulted to see if toe is salvageable, appreciate input ~ follow up recommendations -Wound care   Hypertension -Continuous cardiac monitoring -Maintain MAP >65 -Prn Hydralazine   Diabetes Mellitus -CBG's -SSI -Follow ICU Hypo/hyperglycemia protocol -Hold home Metformin -Check Hgb A1c in the a.m.   BEST PRACTICES DISPOSITION: STEPDOWN GOALS OF CARE: FULL CODE VTE PROPHYLAXIS/ANTICOAGULATION: HEPARIN GTT CONSULTS: VASCULAR SURGERY, PODIATRY  UPDATES: UPDATED PT AT BEDSIDE 03/06/20  Critical care provider statement:    Critical care time (minutes):  33   Critical care time was exclusive of:  Separately billable procedures and  treating other patients   Critical care was necessary to treat or prevent imminent or  life-threatening deterioration of the following conditions:  critical limb ischemia, hyperglycemia   Critical care was time spent personally by me on the following  activities:  Development of treatment plan with patient or surrogate,  discussions with consultants, evaluation of patient's response to  treatment, examination of patient, obtaining history from patient or   surrogate, ordering and performing treatments and interventions, ordering  and review of laboratory studies and re-evaluation of patient's condition   I assumed direction of critical care for this patient from another  provider in my specialty: no     Vida Rigger, M.D.  Pulmonary & Critical Care Medicine  [facility]    03/08/2020, 10:40 AM

## 2020-03-08 NOTE — Progress Notes (Signed)
   03/08/20 1808  Assess: MEWS Score  Temp 100.1 F (37.8 C)  BP 121/82  Pulse Rate (!) 126  Resp 16  SpO2 99 %  O2 Device Room Air  Assess: MEWS Score  MEWS Temp 0  MEWS Systolic 0  MEWS Pulse 2  MEWS RR 0  MEWS LOC 0  MEWS Score 2  MEWS Score Color Yellow  Assess: if the MEWS score is Yellow or Red  Were vital signs taken at a resting state? Yes  Focused Assessment No change from prior assessment  Early Detection of Sepsis Score *See Row Information* Low  MEWS guidelines implemented *See Row Information* Yes  Treat  MEWS Interventions Other (Comment) (MD paged)  Pain Scale 0-10  Pain Score 3  Pain Type Acute pain  Pain Location Foot  Pain Orientation Left  Pain Descriptors / Indicators Aching  Pain Onset On-going  Pain Intervention(s) Refused  Take Vital Signs  Increase Vital Sign Frequency  Yellow: Q 2hr X 2 then Q 4hr X 2, if remains yellow, continue Q 4hrs  Escalate  MEWS: Escalate Yellow: discuss with charge nurse/RN and consider discussing with provider and RRT  Notify: Charge Nurse/RN  Name of Charge Nurse/RN Notified Miles Costain., RN  Date Charge Nurse/RN Notified 03/08/20  Time Charge Nurse/RN Notified 1810  Notify: Provider  Provider Name/Title Erin Fulling  Date Provider Notified 03/08/20  Time Provider Notified 1810  Notification Type Page  Notification Reason Change in status (Heart 125)  Response Other (Comment) (awaiting for response)  Date of Provider Response 03/08/20  Time of Provider Response 1810  Notify: Rapid Response  Name of Rapid Response RN Notified  (not contacted)  yellow MEWS score protocol has been initiated. MD, change nurse and Nursing assistant have been notified.

## 2020-03-09 DIAGNOSIS — I10 Essential (primary) hypertension: Secondary | ICD-10-CM

## 2020-03-09 LAB — GLUCOSE, CAPILLARY
Glucose-Capillary: 151 mg/dL — ABNORMAL HIGH (ref 70–99)
Glucose-Capillary: 113 mg/dL — ABNORMAL HIGH (ref 70–99)

## 2020-03-09 LAB — C-PEPTIDE: C-Peptide: 2.3 ng/mL (ref 1.1–4.4)

## 2020-03-09 LAB — BASIC METABOLIC PANEL
Anion gap: 5 (ref 5–15)
BUN: 5 mg/dL — ABNORMAL LOW (ref 6–20)
CO2: 25 mmol/L (ref 22–32)
Calcium: 8.4 mg/dL — ABNORMAL LOW (ref 8.9–10.3)
Chloride: 105 mmol/L (ref 98–111)
Creatinine, Ser: 0.54 mg/dL (ref 0.44–1.00)
GFR calc Af Amer: >60 mL/min (ref >60)
GFR calc non Af Amer: >60 mL/min (ref >60)
Glucose, Bld: 93 mg/dL (ref 70–99)
Potassium: 3.3 mmol/L — ABNORMAL LOW (ref 3.5–5.1)
Sodium: 135 mmol/L (ref 135–145)

## 2020-03-09 LAB — MAGNESIUM: Magnesium: 1.7 mg/dL (ref 1.7–2.4)

## 2020-03-09 LAB — PROCALCITONIN: Procalcitonin: <0.1 ng/mL

## 2020-03-09 MED ORDER — POTASSIUM CHLORIDE CRYS ER 20 MEQ PO TBCR
40.0000 meq | EXTENDED_RELEASE_TABLET | ORAL | Status: AC
  Administered 2020-03-09 (×2): 40 meq via ORAL
  Filled 2020-03-09 (×2): qty 2

## 2020-03-09 MED ORDER — MAGNESIUM SULFATE 2 GM/50ML IV SOLN
2.0000 g | Freq: Once | INTRAVENOUS | Status: AC
  Administered 2020-03-09: 2 g via INTRAVENOUS
  Filled 2020-03-09: qty 50

## 2020-03-09 MED ORDER — FAMOTIDINE 20 MG PO TABS
20.0000 mg | ORAL_TABLET | Freq: Two times a day (BID) | ORAL | Status: DC
  Administered 2020-03-09: 20 mg via ORAL
  Filled 2020-03-09: qty 1

## 2020-03-09 NOTE — Discharge Summary (Signed)
Physician Discharge Summary  Patient ID: Jocelyn [name] MRN: [id number] DOB/AGE: Jul 08, 1981 39 y.o.  Admit date: 03/06/2020 Discharge date: 03/09/2020  Admission Diagnoses:  Discharge Diagnoses:  Active Problems:   Ischemic rest pain of lower extremity Essential hypertension Type 2 diabetes.  Discharged Condition: good  Hospital Course:  39 y.o. Female with a PMH significant for PAD, DM, HTN s/p LLE angiogram w/ stent placement on 01/11/2020. She is admitted 03/06/2020 due to Rest pain of LLE concerning for reocclusion/rethrombosis of previous stent and Gangrenous chronic wound of left great toe.  Vascular Surgery and Podiatry consulted.  Plan for urgent Angiogram and lysis catheter for infusion of tPA overnight with repeated Angiogram performed on 03/07/2020.  Patient is a placed on Eliquis, condition has improved.  Vascular surgery has cleared patient for discharge.  Patient will continue Eliquis at home, prescription has been sent to the pharmacy already by vascular surgeon. She will be followed by podiatry, vascular surgeon and her primary care physician after discharge.  Consults: Critical care, podiatry and vascular surgery  Significant Diagnostic Studies:  LEFT FOOT - COMPLETE 3+ VIEW  COMPARISON:  Left foot x-rays dated January 23, 2020.  FINDINGS: No acute fracture or dislocation. No bony destruction or periosteal reaction. Joint spaces are preserved. Bone mineralization is normal. Unchanged mild dorsal forefoot soft tissue swelling.  IMPRESSION: 1. Unchanged mild dorsal forefoot soft tissue swelling. No acute osseous abnormality.   Electronically Signed   By: Obie Dredge M.D.   On: 03/07/2020 15:06   Treatments: Endovascular intervention with thrombectomy, PTA and stenting, LE  Discharge Exam: Blood pressure 130/86, pulse (!) 108, temperature 99.1 F (37.3 C), temperature source Oral, resp. rate 17, height 5\' 5"  (1.651 m), weight 85.9 kg, last menstrual  period 02/17/2020, SpO2 99 %. General appearance: alert and cooperative Resp: clear to auscultation bilaterally Cardio: regular rate and rhythm, S1, S2 normal, no murmur, click, rub or gallop GI: soft, non-tender; bowel sounds normal; no masses,  no organomegaly Extremities: Left foot swelling.  Disposition: Discharge disposition: 01-Home or Self Care       Discharge Instructions    AMB referral to wound care center   Complete by: As directed    Diet - low sodium heart healthy   Complete by: As directed    Discharge wound care:   Complete by: As directed    Refer to wound care   Increase activity slowly   Complete by: As directed      Allergies as of 03/09/2020   No Known Allergies     Medication List    TAKE these medications   amLODipine 5 MG tablet Commonly known as: NORVASC Take 5 mg by mouth daily.   apixaban 5 MG Tabs tablet Commonly known as: ELIQUIS Take 1 tablet (5 mg total) by mouth 2 (two) times daily.   aspirin EC 81 MG tablet Take 1 tablet (81 mg total) by mouth daily. Swallow whole.   gabapentin 300 MG capsule Commonly known as: NEURONTIN Take 300 mg by mouth 3 (three) times daily.   Iron 325 (65 Fe) MG Tabs Take 1 tablet (325 mg total) by mouth daily.   nicotine 21 mg/24hr patch Commonly known as: NICODERM CQ - dosed in mg/24 hours Place 1 patch (21 mg total) onto the skin daily.            Discharge Care Instructions  (From admission, onward)         Start     Ordered   03/09/20  0000  Discharge wound care:       Comments: Refer to wound care   03/09/20 1242          Follow-up Information    Dew, Marlow Baars, MD Follow up in 1 month(s).   Specialties: Vascular Surgery, Radiology, Interventional Cardiology Why: Can see Dew or Vivia Birmingham.  Patient will need ABI with visit. Contact information: [address] [place] [address] [phone]        [facility] Follow up in 1 week(s).   Contact  information: [address] [place] [address] [phone]        Gwyneth Revels, DPM Follow up in 2 week(s).   Specialty: Podiatry Contact information: [address] [place] [address] [phone]               Signed: Marrion Coy 03/09/2020, 12:44 PM

## 2020-03-09 NOTE — Plan of Care (Signed)
The patient has been stable triple lumen femoral catheter has been removed. IV removed. No falls. Two doses of Eliquis given as recommended by vascular provider until the patient refills her prescription for Eliquis tomorrow Monday.Education has been performed with the patient.  Problem: Education: Goal: Knowledge of General Education information will improve Description: Including pain rating scale, medication(s)/side effects and non-pharmacologic comfort measures Outcome: Completed/Met   Problem: Health Behavior/Discharge Planning: Goal: Ability to manage health-related needs will improve Outcome: Completed/Met   Problem: Clinical Measurements: Goal: Ability to maintain clinical measurements within normal limits will improve Outcome: Completed/Met Goal: Will remain free from infection Outcome: Completed/Met Goal: Diagnostic test results will improve Outcome: Completed/Met Goal: Respiratory complications will improve Outcome: Completed/Met Goal: Cardiovascular complication will be avoided Outcome: Completed/Met   Problem: Activity: Goal: Risk for activity intolerance will decrease Outcome: Completed/Met   Problem: Nutrition: Goal: Adequate nutrition will be maintained Outcome: Completed/Met   Problem: Coping: Goal: Level of anxiety will decrease Outcome: Completed/Met   Problem: Elimination: Goal: Will not experience complications related to bowel motility Outcome: Completed/Met Goal: Will not experience complications related to urinary retention Outcome: Completed/Met   Problem: Pain Managment: Goal: General experience of comfort will improve Outcome: Completed/Met   Problem: Safety: Goal: Ability to remain free from injury will improve Outcome: Completed/Met   Problem: Skin Integrity: Goal: Risk for impaired skin integrity will decrease Outcome: Completed/Met

## 2020-03-09 NOTE — Consult Note (Signed)
PHARMACY CONSULT NOTE  Pharmacy Consult for Electrolyte Monitoring and Replacement   Recent Labs: Potassium (mmol/L)  Date Value  03/09/2020 3.3 (L)  04/24/2013 4.4   Magnesium (mg/dL)  Date Value  25/00/3704 1.7   Calcium (mg/dL)  Date Value  88/89/1694 8.4 (L)   Calcium, Total (mg/dL)  Date Value  50/38/8828 9.1   Albumin (g/dL)  Date Value  00/34/9179 3.9  04/24/2013 4.1   Sodium (mmol/L)  Date Value  03/09/2020 135  04/24/2013 134 (L)   Assessment: 39 year old female presenting with LLE pain. PMH includes PAD, diabetes, HTN, and LLE angiogram w/ stent placement 01/11/20. Current K 3.2, Mg 1.3. Pharmacy consulted for monitoring and replacement.   Goal of Therapy:  Electrolytes WNL  Plan:  Will give Mg 2 g IV x 1 and KCL 40 mEq x 2 PO.   - f/u electrolyte levels with AM labs  Ronnald Ramp, PharmD, BCPS 03/09/2020 7:24 AM

## 2020-03-09 NOTE — Progress Notes (Addendum)
Vascular Surgery  Daily Progress Note   Subjective:   Patient presented to the [facility] emergency department  with progressively worsening pain to the left foot.  Also notes the wound to her first left toe has worsened as well.  Denies any fever, nausea vomiting.  Denies any shortness of breath or chest pain.  Endovascular intervention with thrombectomy, PTA and stenting performed yesterday She feels better . Pain is almost resolved.she can move her foot and toes better Was not able to obtain NOAC for d/c yesterday.  Objective: Vitals:   03/09/20 0011 03/09/20 0429 03/09/20 0449 03/09/20 0925  BP: (!) 147/92 140/83  134/82  Pulse: (!) 109 97  (!) 103  Resp: 16 16  18   Temp: 98.8 F (37.1 C) 98.6 F (37 C)  98.7 F (37.1 C)  TempSrc: Oral Oral  Oral  SpO2: 100% 100%  100%  Weight:   85.9 kg   Height:        Intake/Output Summary (Last 24 hours) at 03/09/2020 0940 Last data filed at 03/09/2020 0514 Gross per 24 hour  Intake 100 ml  Output 300 ml  Net -200 ml    Physical Exam: A&Ox3, NAD CV: RRR Pulmonary: CTA Bilaterally Abdomen: Soft, Nontender, Nondistended Vascular:  Left Lower Extremity: Thigh soft.  Calf soft.  Extremities warm distally toes. Biphasic DP and PT  Laboratory: CBC    Component Value Date/Time   WBC 7.1 03/08/2020 0416   HGB 11.0 (L) 03/08/2020 0416   HGB 11.7 (L) 04/24/2013 1557   HCT 33.2 (L) 03/08/2020 0416   HCT 35.6 04/24/2013 1557   PLT 220 03/08/2020 0416   PLT 316 04/24/2013 1557   BMET    Component Value Date/Time   NA 135 03/09/2020 0438   NA 134 (L) 04/24/2013 1557   K 3.3 (L) 03/09/2020 0438   K 4.4 04/24/2013 1557   CL 105 03/09/2020 0438   CL 105 04/24/2013 1557   CO2 25 03/09/2020 0438   CO2 24 04/24/2013 1557   GLUCOSE 93 03/09/2020 0438   GLUCOSE 103 (H) 04/24/2013 1557   BUN 5 (L) 03/09/2020 0438   BUN 7 04/24/2013 1557   CREATININE 0.54 03/09/2020 0438   CREATININE 0.51 (L) 04/24/2013  1557   CALCIUM 8.4 (L) 03/09/2020 0438   CALCIUM 9.1 04/24/2013 1557   GFRNONAA >60 03/09/2020 0438   GFRNONAA >60 04/24/2013 1557   GFRAA >60 03/09/2020 0438   GFRAA >60 04/24/2013 1557   Assessment/Planning:  1) 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  Patient states that if she gets her dose of anticoagulation today, she will be able to get it tomorrow thru her pharmacy. She is absolutely sure she can and insists on leaving today.  Ok to d/c  With f/u in two weeks in the office F/u with  Podiatry as well.   2) Tobacco Abuse: Patient notes that she has "cut back" on smoking tobacco however does still actively engage in a daily basis. We had a discussion for approximately three minutes regarding the absolute need for smoking cessation due to the deleterious nature of tobacco on the vascular system. We discussed the tobacco use would diminish patency of any intervention, and likely significantly worsen progression of disease.   4) Diabetes: On appropriate medications Unsure if controlled Will order hemoglobin A1c in a.m.

## 2021-01-20 IMAGING — US US EXTREM LOW VENOUS*L*
1 series · 13 of 24 positions shown · non-contrast
Comparison: None.

CLINICAL DATA: 38-year-old female with a history of left foot
swelling



[Series 1: us venous img lower uni left (dvt) · portal-venous · 13 of 42 slices shown]
[im 1/42]
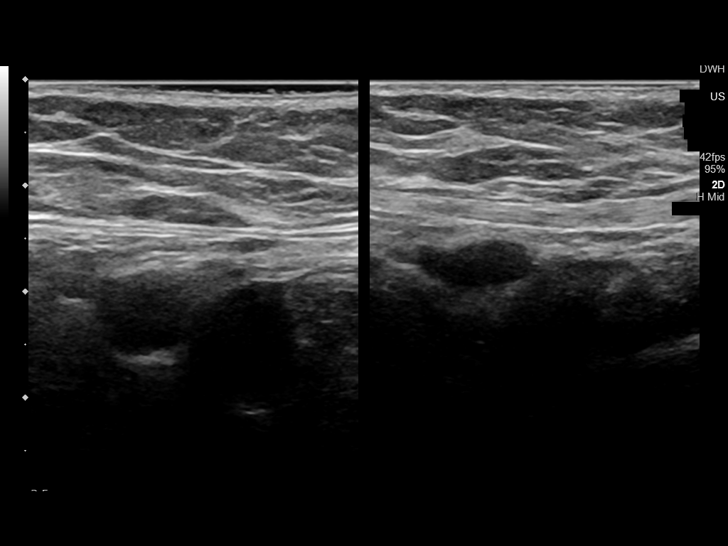
[im 4/42]
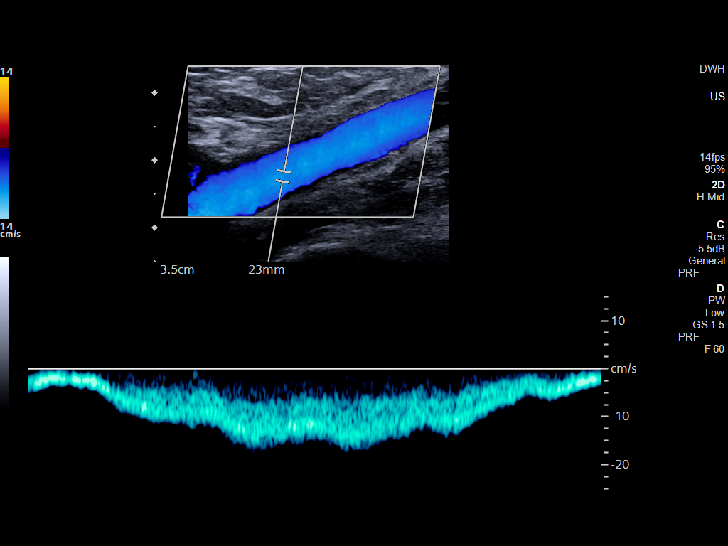
[im 8/42]
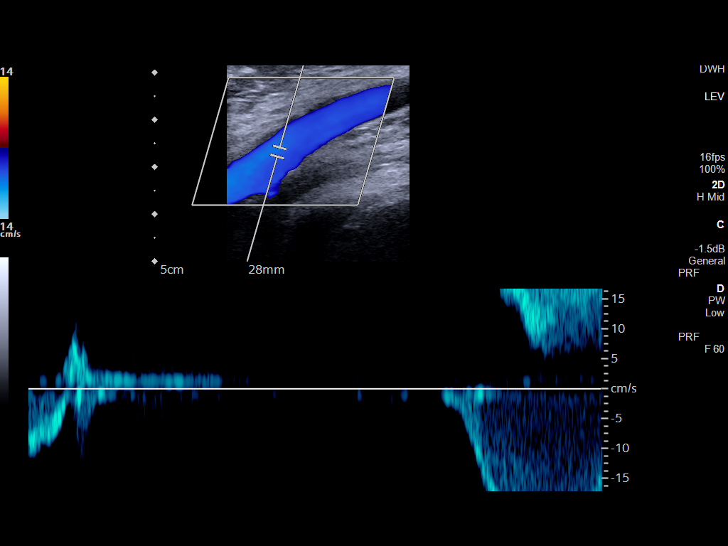
[im 11/42]
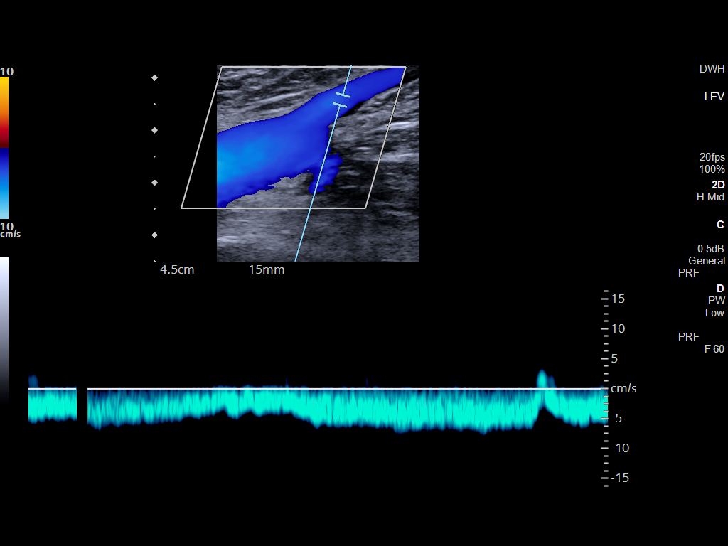
[im 15/42]
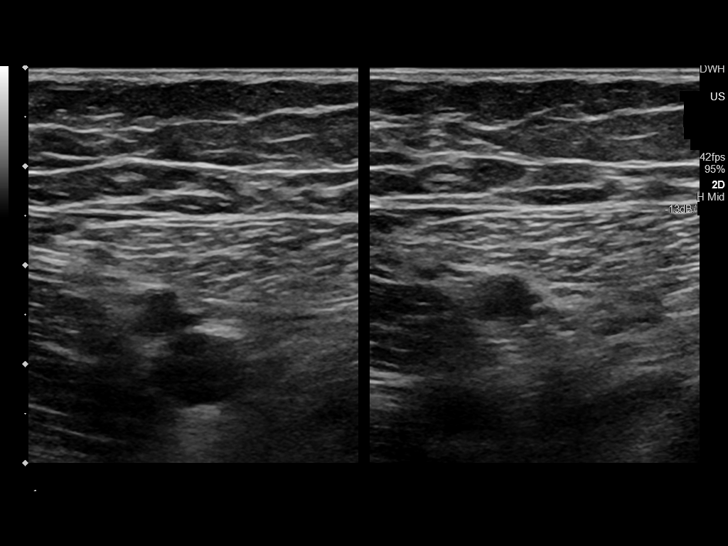
[im 18/42]
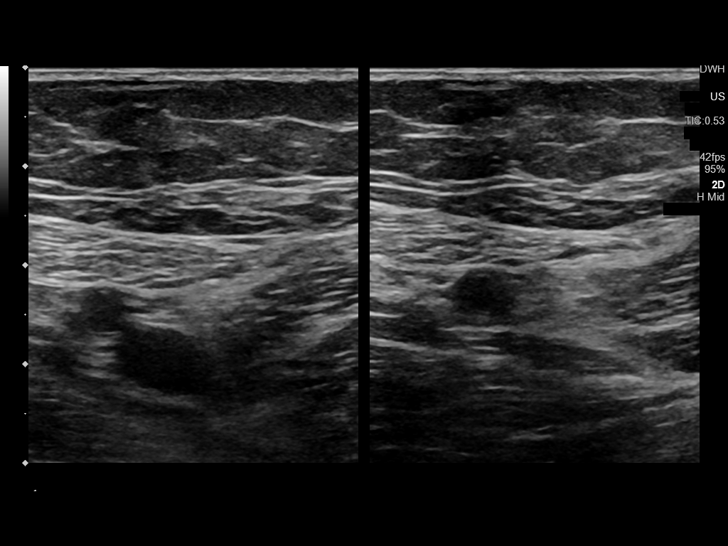
[im 22/42]
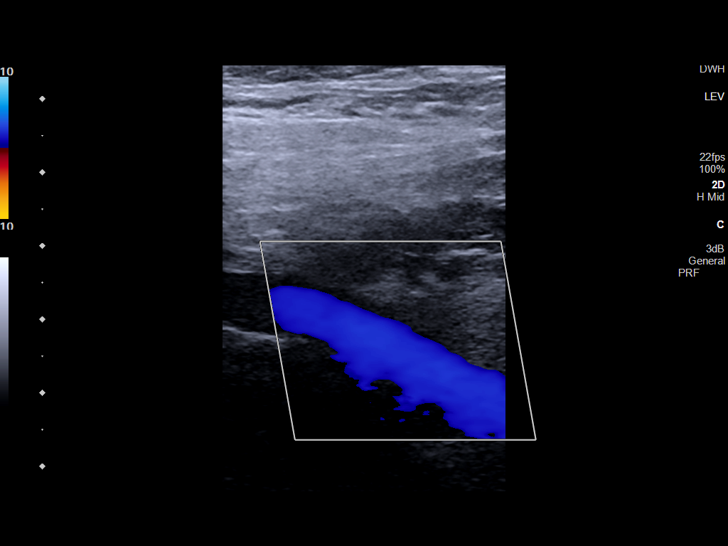
[im 24/42]
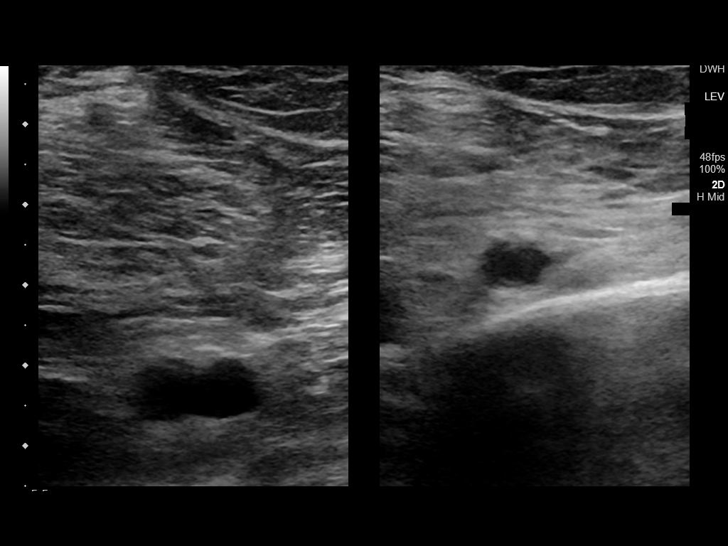
[im 27/42]
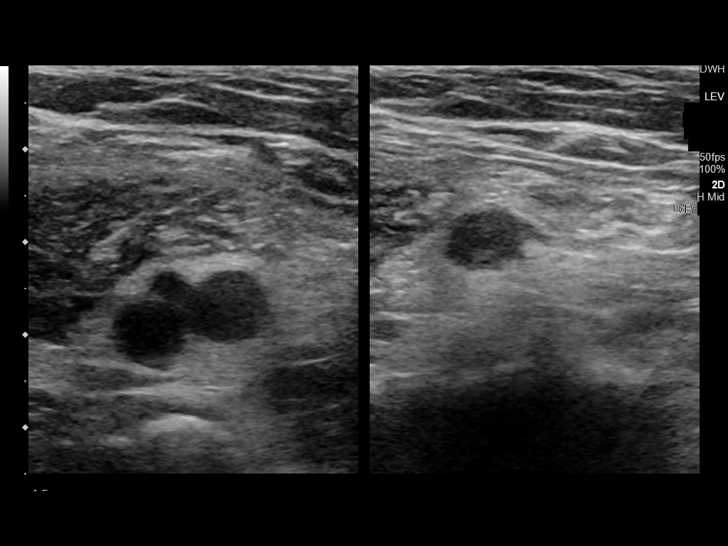
[im 31/42]
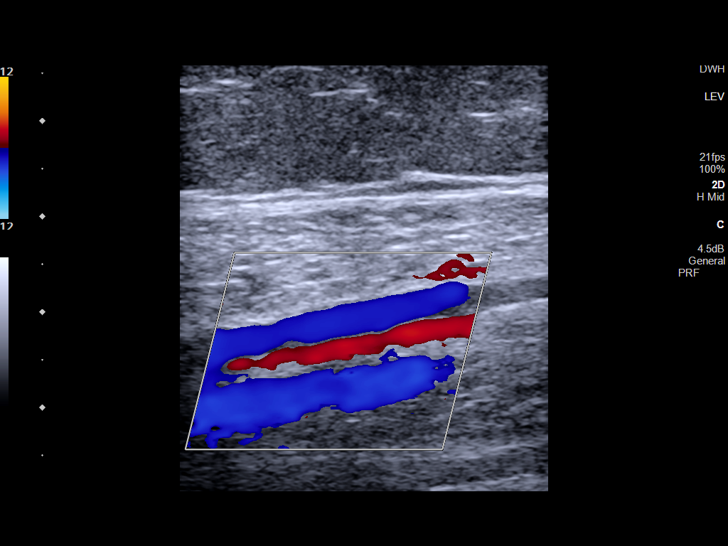
[im 34/42]
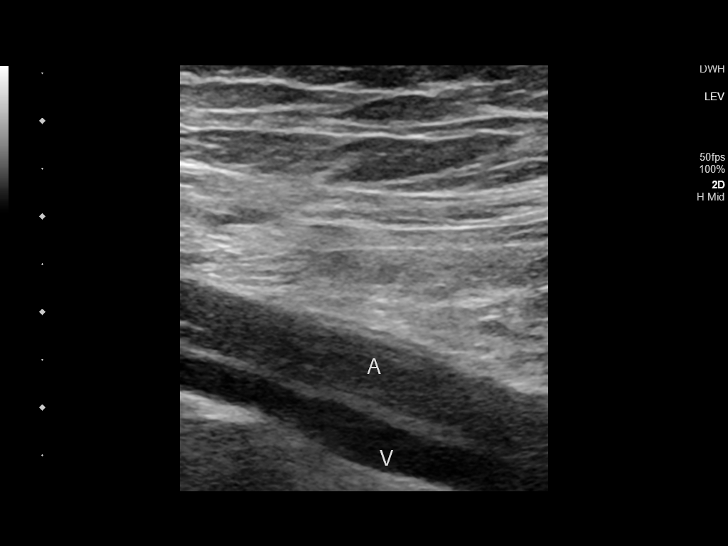
[im 38/42]
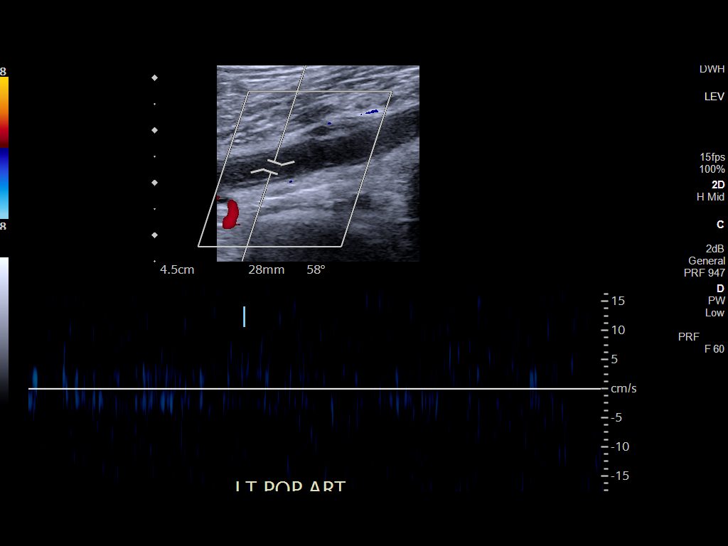
[im 42/42]
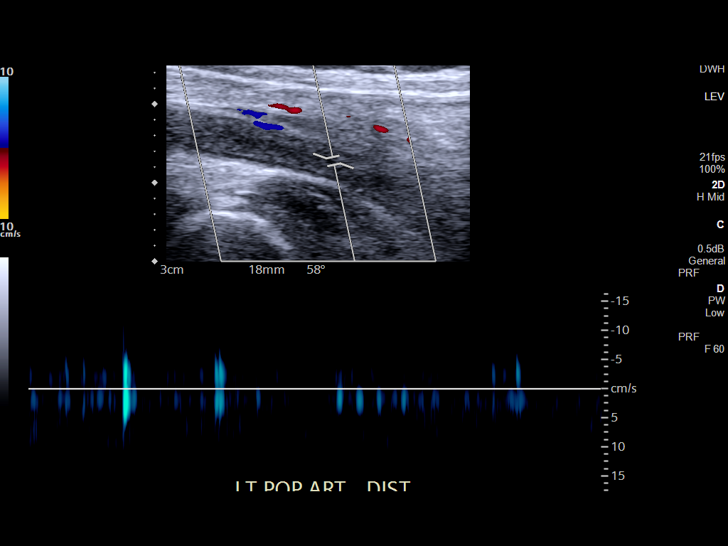

[13 of 24 positions shown; findings below may reference images not displayed]

FINDINGS: Contralateral Common Femoral Vein: Respiratory phasicity is normal
and symmetric with the symptomatic side. No evidence of thrombus.
Normal compressibility.

Common Femoral Vein: No evidence of thrombus. Normal
compressibility, respiratory phasicity and response to augmentation.

Saphenofemoral Junction: No evidence of thrombus. Normal
compressibility and flow on color Doppler imaging.

Profunda Femoral Vein: No evidence of thrombus. Normal
compressibility and flow on color Doppler imaging.

Femoral Vein: No evidence of thrombus. Normal compressibility,
respiratory phasicity and response to augmentation.

Popliteal Vein: No evidence of thrombus. Normal compressibility,
respiratory phasicity and response to augmentation.

Calf Veins: No evidence of thrombus. Normal compressibility and flow
on color Doppler imaging.

Superficial Great Saphenous Vein: No evidence of thrombus. Normal
compressibility and flow on color Doppler imaging.

Other Findings: Incidental finding of occluded distal
femoropopliteal segment.
IMPRESSION: Sonographic survey of the left lower extremity negative for DVT.

Incidental finding of occlusion of the distal femoropopliteal
segment, unknown chronicity.

## 2021-03-24 IMAGING — DX DG FOOT COMPLETE 3+V*L*
3 series · 3 of 3 positions shown · non-contrast
Comparison: Left foot x-rays dated January 23, 2020.

CLINICAL DATA: Left great toe ulcer.

EXAM:
LEFT FOOT - COMPLETE 3+ VIEW

[foot ap]
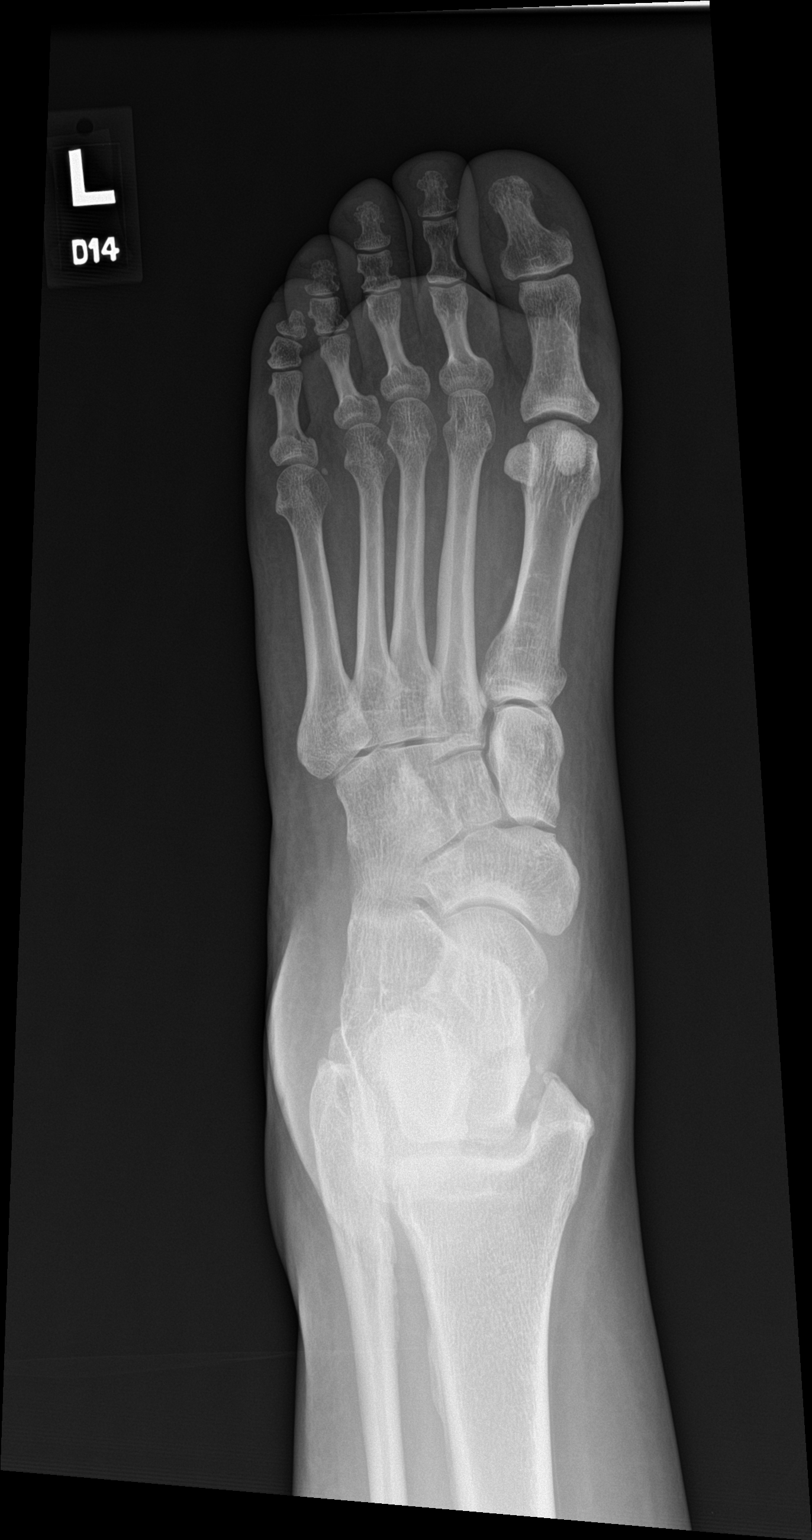

[foot obl]
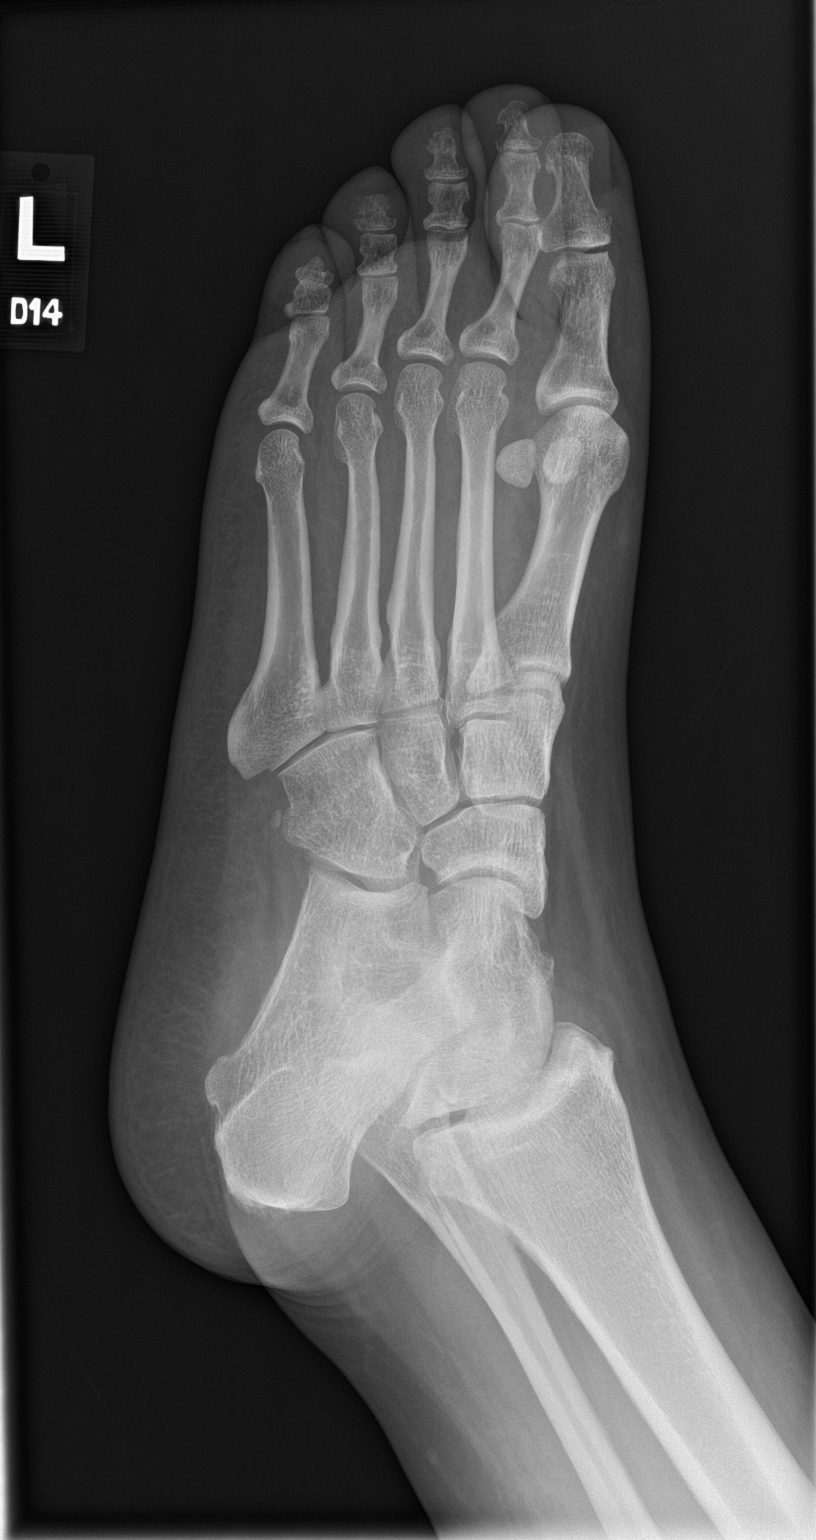

[foot lat]
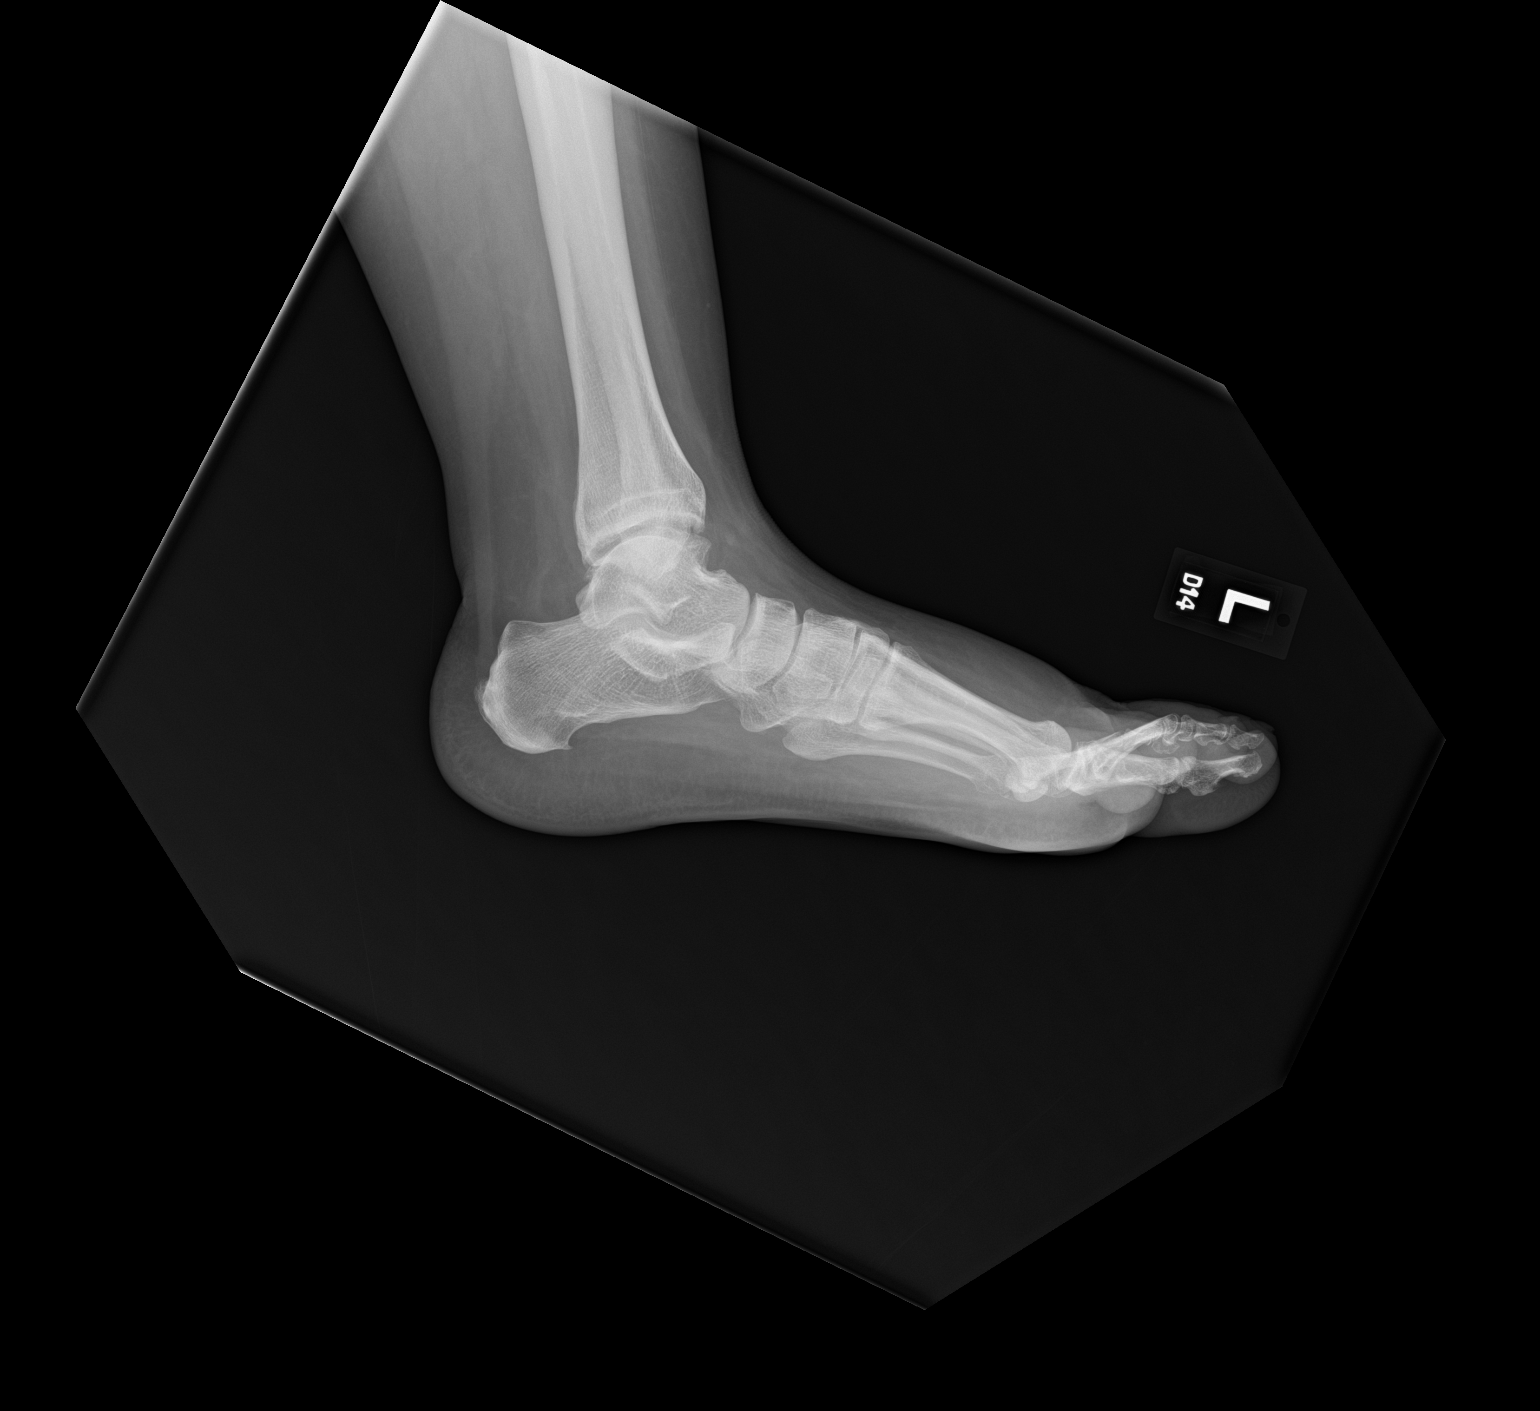

[3 of 3 positions shown; findings below may reference images not displayed]

FINDINGS: No acute fracture or dislocation. No bony destruction or periosteal
reaction. Joint spaces are preserved. Bone mineralization is normal.
Unchanged mild dorsal forefoot soft tissue swelling.
IMPRESSION: 1. Unchanged mild dorsal forefoot soft tissue swelling. No acute
osseous abnormality.

## 2022-01-05 ENCOUNTER — Inpatient Hospital Stay
Admission: EM | Admit: 2022-01-05 | Discharge: 2022-01-09 | DRG: 271 | Disposition: A | Discharge status: Home / Self Care | Payer: Medicaid Other | Attending: Internal Medicine | Admitting: Internal Medicine

## 2022-01-05 ENCOUNTER — Emergency Department: Payer: Medicaid Other

## 2022-01-05 ENCOUNTER — Other Ambulatory Visit: Payer: Self-pay

## 2022-01-05 DIAGNOSIS — I10 Essential (primary) hypertension: Secondary | ICD-10-CM | POA: Diagnosis present

## 2022-01-05 DIAGNOSIS — F17210 Nicotine dependence, cigarettes, uncomplicated: Secondary | ICD-10-CM | POA: Diagnosis present

## 2022-01-05 DIAGNOSIS — Z6831 Body mass index (BMI) 31.0-31.9, adult: Secondary | ICD-10-CM

## 2022-01-05 DIAGNOSIS — N179 Acute kidney failure, unspecified: Secondary | ICD-10-CM | POA: Diagnosis present

## 2022-01-05 DIAGNOSIS — I743 Embolism and thrombosis of arteries of the lower extremities: Secondary | ICD-10-CM | POA: Diagnosis present

## 2022-01-05 DIAGNOSIS — I998 Other disorder of circulatory system: Secondary | ICD-10-CM | POA: Diagnosis not present

## 2022-01-05 DIAGNOSIS — E669 Obesity, unspecified: Secondary | ICD-10-CM | POA: Diagnosis present

## 2022-01-05 DIAGNOSIS — A419 Sepsis, unspecified organism: Secondary | ICD-10-CM | POA: Diagnosis present

## 2022-01-05 DIAGNOSIS — D509 Iron deficiency anemia, unspecified: Secondary | ICD-10-CM | POA: Diagnosis present

## 2022-01-05 DIAGNOSIS — R6520 Severe sepsis without septic shock: Secondary | ICD-10-CM | POA: Diagnosis present

## 2022-01-05 DIAGNOSIS — R739 Hyperglycemia, unspecified: Secondary | ICD-10-CM | POA: Diagnosis not present

## 2022-01-05 DIAGNOSIS — D649 Anemia, unspecified: Secondary | ICD-10-CM

## 2022-01-05 DIAGNOSIS — I739 Peripheral vascular disease, unspecified: Secondary | ICD-10-CM | POA: Diagnosis present

## 2022-01-05 DIAGNOSIS — E1151 Type 2 diabetes mellitus with diabetic peripheral angiopathy without gangrene: Principal | ICD-10-CM | POA: Diagnosis present

## 2022-01-05 DIAGNOSIS — D62 Acute posthemorrhagic anemia: Secondary | ICD-10-CM | POA: Diagnosis present

## 2022-01-05 DIAGNOSIS — E872 Acidosis, unspecified: Secondary | ICD-10-CM

## 2022-01-05 DIAGNOSIS — I70222 Atherosclerosis of native arteries of extremities with rest pain, left leg: Secondary | ICD-10-CM | POA: Diagnosis present

## 2022-01-05 DIAGNOSIS — R200 Anesthesia of skin: Secondary | ICD-10-CM | POA: Diagnosis present

## 2022-01-05 DIAGNOSIS — M79606 Pain in leg, unspecified: Secondary | ICD-10-CM | POA: Diagnosis present

## 2022-01-05 DIAGNOSIS — Z86718 Personal history of other venous thrombosis and embolism: Secondary | ICD-10-CM

## 2022-01-05 DIAGNOSIS — E119 Type 2 diabetes mellitus without complications: Secondary | ICD-10-CM

## 2022-01-05 DIAGNOSIS — R9431 Abnormal electrocardiogram [ECG] [EKG]: Secondary | ICD-10-CM | POA: Diagnosis not present

## 2022-01-05 DIAGNOSIS — Z8249 Family history of ischemic heart disease and other diseases of the circulatory system: Secondary | ICD-10-CM

## 2022-01-05 DIAGNOSIS — E871 Hypo-osmolality and hyponatremia: Secondary | ICD-10-CM | POA: Diagnosis present

## 2022-01-05 DIAGNOSIS — E1110 Type 2 diabetes mellitus with ketoacidosis without coma: Secondary | ICD-10-CM | POA: Diagnosis present

## 2022-01-05 DIAGNOSIS — Z95820 Peripheral vascular angioplasty status with implants and grafts: Secondary | ICD-10-CM

## 2022-01-05 LAB — COMPREHENSIVE METABOLIC PANEL
ALT: 16 U/L (ref 0–44)
AST: 25 U/L (ref 15–41)
Albumin: 3.6 g/dL (ref 3.5–5.0)
Alkaline Phosphatase: 74 U/L (ref 38–126)
Anion gap: 17 — ABNORMAL HIGH (ref 5–15)
BUN: 12 mg/dL (ref 6–20)
CO2: 17 mmol/L — ABNORMAL LOW (ref 22–32)
Calcium: 9 mg/dL (ref 8.9–10.3)
Chloride: 99 mmol/L (ref 98–111)
Creatinine, Ser: 1.16 mg/dL — ABNORMAL HIGH (ref 0.44–1.00)
GFR, Estimated: >60 mL/min (ref >60)
Glucose, Bld: 223 mg/dL — ABNORMAL HIGH (ref 70–99)
Potassium: 3.6 mmol/L (ref 3.5–5.1)
Sodium: 133 mmol/L — ABNORMAL LOW (ref 135–145)
Total Bilirubin: 0.8 mg/dL (ref 0.3–1.2)
Total Protein: 7.9 g/dL (ref 6.5–8.1)

## 2022-01-05 LAB — CBC WITH DIFFERENTIAL/PLATELET
Abs Immature Granulocytes: 0.18 10*3/uL — ABNORMAL HIGH (ref 0.00–0.07)
Basophils Absolute: 0.1 10*3/uL (ref 0.0–0.1)
Basophils Relative: 0 %
Eosinophils Absolute: 0.1 10*3/uL (ref 0.0–0.5)
Eosinophils Relative: 0 %
HCT: 23.2 % — ABNORMAL LOW (ref 36.0–46.0)
Hemoglobin: 5.6 g/dL — ABNORMAL LOW (ref 12.0–15.0)
Immature Granulocytes: 1 %
Lymphocytes Relative: 8 %
Lymphs Abs: 1.6 10*3/uL (ref 0.7–4.0)
MCH: 15.1 pg — ABNORMAL LOW (ref 26.0–34.0)
MCHC: 24.1 g/dL — ABNORMAL LOW (ref 30.0–36.0)
MCV: 62.5 fL — ABNORMAL LOW (ref 80.0–100.0)
Monocytes Absolute: 1.5 10*3/uL — ABNORMAL HIGH (ref 0.1–1.0)
Monocytes Relative: 7 %
Neutro Abs: 18 10*3/uL — ABNORMAL HIGH (ref 1.7–7.7)
Neutrophils Relative %: 84 %
Platelets: 404 10*3/uL — ABNORMAL HIGH (ref 150–400)
RBC: 3.71 MIL/uL — ABNORMAL LOW (ref 3.87–5.11)
RDW: 23.1 % — ABNORMAL HIGH (ref 11.5–15.5)
WBC: 21.5 10*3/uL — ABNORMAL HIGH (ref 4.0–10.5)
nRBC: 0.8 % — ABNORMAL HIGH (ref 0.0–0.2)

## 2022-01-05 LAB — HEPARIN LEVEL (UNFRACTIONATED): Heparin Unfractionated: <0.1 IU/mL — ABNORMAL LOW (ref 0.30–0.70)

## 2022-01-05 LAB — MAGNESIUM: Magnesium: 1.6 mg/dL — ABNORMAL LOW (ref 1.7–2.4)

## 2022-01-05 LAB — CBG MONITORING, ED: Glucose-Capillary: 167 mg/dL — ABNORMAL HIGH (ref 70–99)

## 2022-01-05 LAB — CK: Total CK: 79 U/L (ref 38–234)

## 2022-01-05 LAB — PROTIME-INR
INR: 1.2 (ref 0.8–1.2)
Prothrombin Time: 15.5 seconds — ABNORMAL HIGH (ref 11.4–15.2)

## 2022-01-05 LAB — HCG, QUANTITATIVE, PREGNANCY: hCG, Beta Chain, Quant, S: 1 m[IU]/mL (ref <5)

## 2022-01-05 LAB — APTT: aPTT: 30 seconds (ref 24–36)

## 2022-01-05 MED ORDER — HEPARIN BOLUS VIA INFUSION
5300.0000 [IU] | Freq: Once | INTRAVENOUS | Status: AC
  Administered 2022-01-06: 5300 [IU] via INTRAVENOUS
  Filled 2022-01-05: qty 5300

## 2022-01-05 MED ORDER — HEPARIN (PORCINE) 25000 UT/250ML-% IV SOLN
1250.0000 [IU]/h | INTRAVENOUS | Status: DC
  Administered 2022-01-06: 1250 [IU]/h via INTRAVENOUS
  Filled 2022-01-05: qty 250

## 2022-01-05 MED ORDER — INSULIN ASPART 100 UNIT/ML IJ SOLN
0.0000 [IU] | INTRAMUSCULAR | Status: DC
  Administered 2022-01-06 (×2): 2 [IU] via SUBCUTANEOUS
  Administered 2022-01-06: 3 [IU] via SUBCUTANEOUS
  Administered 2022-01-06: 5 [IU] via SUBCUTANEOUS
  Administered 2022-01-07 (×3): 3 [IU] via SUBCUTANEOUS
  Administered 2022-01-07: 5 [IU] via SUBCUTANEOUS
  Administered 2022-01-07 – 2022-01-08 (×2): 3 [IU] via SUBCUTANEOUS
  Administered 2022-01-08: 2 [IU] via SUBCUTANEOUS
  Administered 2022-01-08 (×2): 3 [IU] via SUBCUTANEOUS
  Administered 2022-01-08: 2 [IU] via SUBCUTANEOUS
  Administered 2022-01-08: 3 [IU] via SUBCUTANEOUS
  Administered 2022-01-09: 2 [IU] via SUBCUTANEOUS
  Administered 2022-01-09: 5 [IU] via SUBCUTANEOUS
  Administered 2022-01-09 (×2): 3 [IU] via SUBCUTANEOUS
  Filled 2022-01-05 (×20): qty 1

## 2022-01-05 MED ORDER — IOHEXOL 350 MG/ML SOLN
125.0000 mL | Freq: Once | INTRAVENOUS | Status: AC | PRN
  Administered 2022-01-05: 125 mL via INTRAVENOUS

## 2022-01-05 MED ORDER — SODIUM CHLORIDE 0.9% IV SOLUTION
Freq: Once | INTRAVENOUS | Status: DC
  Filled 2022-01-05: qty 250

## 2022-01-05 MED ORDER — LACTATED RINGERS IV BOLUS
1000.0000 mL | Freq: Once | INTRAVENOUS | Status: AC
  Administered 2022-01-06: 1000 mL via INTRAVENOUS

## 2022-01-05 MED ORDER — MAGNESIUM SULFATE 2 GM/50ML IV SOLN
2.0000 g | Freq: Once | INTRAVENOUS | Status: AC
  Administered 2022-01-06: 2 g via INTRAVENOUS
  Filled 2022-01-05: qty 50

## 2022-01-05 MED ORDER — HYDROMORPHONE HCL 1 MG/ML IJ SOLN
0.5000 mg | Freq: Once | INTRAMUSCULAR | Status: AC
  Administered 2022-01-06: 0.5 mg via INTRAVENOUS
  Filled 2022-01-05: qty 0.5

## 2022-01-05 MED ORDER — HYDROMORPHONE HCL 1 MG/ML IJ SOLN
1.0000 mg | Freq: Once | INTRAMUSCULAR | Status: AC
  Administered 2022-01-05: 1 mg via INTRAVENOUS
  Filled 2022-01-05: qty 1

## 2022-01-05 NOTE — ED Provider Notes (Addendum)
[facility] Provider Note    Event Date/Time   First MD Initiated Contact with Patient 01/05/22 2210     (approximate)   History   Leg Pain (Pt with a hx of DVT's presents to the ED for evaluation of left leg pain.)   HPI  Jocelyn [name] is a 41 y.o. female  Female with a PMH significant for iron deficiency anemia with patient reporting she has not been taking her iron, PAD, DM, HTN s/p LLE PAD s/pangiogram w/ stent placement on 7/2/202 readmitted in August 2021 for management of reocclusion discharged on Eliquis who presents from home for evaluation of acute onset of left lower extremity pain.  She states she had some achiness of last couple days but it became very severe about 2 hours prior to arrival.  She states that she has numbness extending from around her knee distally.  She was not numb before 2 hours ago.  She denies any injuries or falls.  No right lower extremity symptoms.  No fevers, chills, chest pain, cough, nausea, vomiting, diarrhea or any other symptoms at this time.  She has not been taking any of her medications for over a month.  She denies any illegal drugs or alcohol today.  She has not had any recent bloody stools or melanotic stools a months ago she had bleeding from her hemorrhoid.  She did start her menstrual period today and they typically last a few minutes but do not cause any significant bleeding.      Physical Exam  Triage Vital Signs: ED Triage Vitals  Enc Vitals Group     BP      Pulse      Resp      Temp      Temp src      SpO2      Weight      Height      Head Circumference      Peak Flow      Pain Score      Pain Loc      Pain Edu?      Excl. in GC?     Most recent vital signs: Vitals:   01/05/22 2211 01/05/22 2217  BP:  (!) 133/91  Pulse:  (!) 166  Resp:  18  Temp:  98.5 F (36.9 C)  SpO2: 95% 100%    General: Awake extremely uncomfortable appearing. CV:  2+ radial pulses.  2+ right PT pulse.  Unable  to palpate or Doppler left PT or DP pulse.  She is very tachycardic. Resp:  Normal effort.  Clear bilaterally. Abd:  No distention.  Other:  Left lower extremity is fairly diffusely edematous compared to the right with some duskiness around the toes.  He does not have any sensation to light touch until about the mid calf.  No penetrating wounds.  Right lower extremities unremarkable.  Chaperoned rectal exam patient has nonbleeding external hemorrhoid but no active bleeding or fissures.   ED Results / Procedures / Treatments  Labs (all labs ordered are listed, but only abnormal results are displayed) Labs Reviewed  CBC WITH DIFFERENTIAL/PLATELET - Abnormal; Notable for the following components:      Result Value   WBC 21.5 (*)    RBC 3.71 (*)    Hemoglobin 5.6 (*)    HCT 23.2 (*)    MCV 62.5 (*)    MCH 15.1 (*)    MCHC 24.1 (*)    RDW 23.1 (*)  Platelets 404 (*)    nRBC 0.8 (*)    Neutro Abs 18.0 (*)    Monocytes Absolute 1.5 (*)    Abs Immature Granulocytes 0.18 (*)    All other components within normal limits  COMPREHENSIVE METABOLIC PANEL - Abnormal; Notable for the following components:   Sodium 133 (*)    CO2 17 (*)    Glucose, Bld 223 (*)    Creatinine, Ser 1.16 (*)    Anion gap 17 (*)    All other components within normal limits  PROTIME-INR - Abnormal; Notable for the following components:   Prothrombin Time 15.5 (*)    All other components within normal limits  HEPARIN LEVEL (UNFRACTIONATED) - Abnormal; Notable for the following components:   Heparin Unfractionated <0.10 (*)    All other components within normal limits  MAGNESIUM - Abnormal; Notable for the following components:   Magnesium 1.6 (*)    All other components within normal limits  CBG MONITORING, ED - Abnormal; Notable for the following components:   Glucose-Capillary 167 (*)    All other components within normal limits  CULTURE, BLOOD (ROUTINE X 2)  CULTURE, BLOOD (ROUTINE X 2)  APTT  HCG,  QUANTITATIVE, PREGNANCY  CK  LACTIC ACID, PLASMA  LACTIC ACID, PLASMA  BETA-HYDROXYBUTYRIC ACID  BLOOD GAS, VENOUS  HEMOGLOBIN A1C  URINALYSIS, COMPLETE (UACMP) WITH MICROSCOPIC  IRON AND TIBC  TYPE AND SCREEN  PREPARE RBC (CROSSMATCH)     EKG  ECG is marked for sinus tachycardia with a ventricular rate of 162, normal axis, unremarkable intervals with APCs and no other clear evidence of acute ischemia or significant arrhythmia.    RADIOLOGY Chest reviewed by myself shows no focal consoidation, effusion, edema, pneumothorax or other clear acute thoracic process. I also reviewed radiology interpretation and agree with findings described.    CTA bilateral runoff study on my interpretation shows what appears to be a distal left superficial femoral artery occlusion extending all the way to the popliteal artery I do not see flow in the trifurcation of the left calf.  I do not see any evidence of a fracture or subcu gas.  I reviewed radiologist rotation and agree with the findings of similar in addition to nonvisualization of vessels below the right calf possibly due to occlusion of bolus timing.   PROCEDURES:  Critical Care performed: Yes, see critical care procedure note(s)  .Critical Care  Performed by: Gilles Chiquito, MD Authorized by: Gilles Chiquito, MD   Critical care provider statement:    Critical care time (minutes):  80   Critical care was necessary to treat or prevent imminent or life-threatening deterioration of the following conditions:  Circulatory failure   Critical care was time spent personally by me on the following activities:  Development of treatment plan with patient or surrogate, discussions with consultants, evaluation of patient's response to treatment, examination of patient, ordering and review of laboratory studies, ordering and review of radiographic studies, ordering and performing treatments and interventions, pulse oximetry, re-evaluation of  patient's condition and review of old charts     MEDICATIONS ORDERED IN ED: Medications  heparin bolus via infusion 5,300 Units (has no administration in time range)  heparin ADULT infusion 100 units/mL (25000 units/244mL) (has no administration in time range)  lactated ringers bolus 1,000 mL (has no administration in time range)  insulin aspart (novoLOG) injection 0-15 Units (has no administration in time range)  HYDROmorphone (DILAUDID) injection 0.5 mg (has no administration in time range)  0.9 %  sodium chloride infusion (Manually program via Guardrails IV Fluids) (has no administration in time range)  magnesium sulfate IVPB 2 g 50 mL (has no administration in time range)  HYDROmorphone (DILAUDID) injection 1 mg (1 mg Intravenous Given 01/05/22 2232)  iohexol (OMNIPAQUE) 350 MG/ML injection 125 mL (125 mLs Intravenous Contrast Given 01/05/22 2238)     IMPRESSION / MDM / ASSESSMENT AND PLAN / ED COURSE  I reviewed the triage vital signs and the nursing notes. Patient's presentation is most consistent with acute presentation with potential threat to life or bodily function.                               Differential diagnosis includes, but is not limited to acute thrombosis or restenosis/occlusion of prior stents versus new occlusion.  I suspect this is likely arterial and will rule this out with CT runoff study first.  We will also start heparin and give patient IV Dilaudid for analgesia.  Vascular surgery paged at 1025.  ECG is marked for sinus tachycardia with a ventricular rate of 162, normal axis, unremarkable intervals with APCs and no other clear evidence of acute ischemia or significant arrhythmia.   Chest reviewed by myself shows no focal consoidation, effusion, edema, pneumothorax or other clear acute thoracic process. I also reviewed radiology interpretation and agree with findings described.  CTA bilateral runoff study on my interpretation shows what appears to be a distal  left superficial femoral artery occlusion extending all the way to the popliteal artery I do not see flow in the trifurcation of the left calf.  I do not see any evidence of a fracture or subcu gas.  I reviewed radiologist rotation and agree with the findings of similar in addition to nonvisualization of vessels below the right calf possibly due to occlusion of bolus timing.  CBC shows WBC count 21.5, hemoglobin of 5.6 and platelets of 404.  CMP is remarkable for bicarb of 17, glucose of 223, creatinine of 1.16 compared to 0.541-year ago and anion gap of 17 with no other significant lecture light or metabolic derangements or evidence of hepatitis.  INR 1.2.  PTT is 30.  hCG is negative.  CK 79.  I suspect nonvisualized opacification on CT in the right lower extremity secondary to bolus timing she does have palpable pulse in the right lower extremity without any symptoms in the right leg.  Patient started heparin for an acute left lower extremity arterial occlusion.  I consulted with on-call vascular surgeon Dr. Lucky Cowboy who recommended heparin and that he would see the patient tomorrow for possible intervention.  Patient has not had any recent melanotic stools or bright red blood per rectum and notes she has not been taking her iron for iron deficiency anemia.  Suspect her anemia which is fairly severe today is likely chronic she was otherwise seems relatively asymptomatic for the onset of the leg pain.  We will place orders for 2 units pRC to be transfused in addition to IV fluids.  She has a history of diabetes but given her hyperglycemia will place on sliding scale insulin.  Patient has no other clear infectious source but given she is very tachycardic with fairly elevated white blood cell count and evidence of acidosis will obtain blood cultures as well.  We will give 1 empiric dose of Rocephin.  Care patient signed over to assuming provider at approximately midnight with plan to admit to  medicine service for  further evaluation and management.   FINAL CLINICAL IMPRESSION(S) / ED DIAGNOSES   Final diagnoses:  Ischemic leg  Hyperglycemia  Anemia, unspecified type  AKI (acute kidney injury) ([place])  Metabolic acidosis  Hypomagnesemia     Rx / DC Orders   ED Discharge Orders     None        Note:  This document was prepared using Dragon voice recognition software and may include unintentional dictation errors.   Lucrezia Starch, MD 01/05/22 2359    Lucrezia Starch, MD 01/06/22 0001    Lucrezia Starch, MD 01/06/22 [phone]

## 2022-01-05 NOTE — Progress Notes (Signed)
ANTICOAGULATION CONSULT NOTE   Pharmacy Consult for heparin infuison Indication: VTE prophylaxis  No Known Allergies  Patient Measurements: Height: 5\' 5"  (165.1 cm) Weight: 85.7 kg (189 lb) IBW/kg (Calculated) : 57 Heparin Dosing Weight: 75.6 kg  Vital Signs: Temp: 98.5 F (36.9 C) (06/27 2217) Temp Source: Oral (06/27 2217) BP: 133/91 (06/27 2217) Pulse Rate: 166 (06/27 2217)  Labs: No results for input(s): "HGB", "HCT", "PLT", "APTT", "LABPROT", "INR", "HEPARINUNFRC", "HEPRLOWMOCWT", "CREATININE", "CKTOTAL", "CKMB", "TROPONINIHS" in the last 72 hours.  CrCl cannot be calculated (Patient's most recent lab result is older than the maximum 21 days allowed.).   Medical History: Past Medical History:  Diagnosis Date   IDA (iron deficiency anemia) 12/17/2019    Medications:  PTA Meds: Eliquis 5 mg BID, however BL HL < 0.1  Assessment: Pt is 41 yo female w/ "hx of clots and stent placement in the lower extremities brought by EMS for left lower leg pain."  Goal of Therapy:  Heparin level 0.3-0.7 units/ml aPTT 66-102 seconds Monitor platelets by anticoagulation protocol: Yes  Plan:  Bolus 5300 units x 1 Start heparin infusion at 1250 units/hr Order HL 6 hrs after start of infusion CBC daily while on heparin  45, PharmD, [facility] 01/05/2022 10:37 PM

## 2022-01-06 ENCOUNTER — Encounter
Admission: EM | Disposition: A | Discharge status: Home / Self Care | Payer: Self-pay | Source: Home / Self Care | Attending: Internal Medicine

## 2022-01-06 DIAGNOSIS — E871 Hypo-osmolality and hyponatremia: Secondary | ICD-10-CM | POA: Diagnosis not present

## 2022-01-06 DIAGNOSIS — N179 Acute kidney failure, unspecified: Secondary | ICD-10-CM | POA: Diagnosis not present

## 2022-01-06 DIAGNOSIS — I743 Embolism and thrombosis of arteries of the lower extremities: Secondary | ICD-10-CM | POA: Diagnosis not present

## 2022-01-06 DIAGNOSIS — Z86718 Personal history of other venous thrombosis and embolism: Secondary | ICD-10-CM | POA: Diagnosis not present

## 2022-01-06 DIAGNOSIS — E669 Obesity, unspecified: Secondary | ICD-10-CM | POA: Diagnosis not present

## 2022-01-06 DIAGNOSIS — I10 Essential (primary) hypertension: Secondary | ICD-10-CM | POA: Diagnosis not present

## 2022-01-06 DIAGNOSIS — D649 Anemia, unspecified: Secondary | ICD-10-CM

## 2022-01-06 DIAGNOSIS — M79606 Pain in leg, unspecified: Secondary | ICD-10-CM | POA: Diagnosis present

## 2022-01-06 DIAGNOSIS — Z91141 Patient's other noncompliance with medication regimen due to financial hardship: Secondary | ICD-10-CM

## 2022-01-06 DIAGNOSIS — I998 Other disorder of circulatory system: Secondary | ICD-10-CM | POA: Diagnosis not present

## 2022-01-06 DIAGNOSIS — R200 Anesthesia of skin: Secondary | ICD-10-CM | POA: Diagnosis not present

## 2022-01-06 DIAGNOSIS — A419 Sepsis, unspecified organism: Secondary | ICD-10-CM | POA: Diagnosis present

## 2022-01-06 DIAGNOSIS — Z95820 Peripheral vascular angioplasty status with implants and grafts: Secondary | ICD-10-CM | POA: Diagnosis not present

## 2022-01-06 DIAGNOSIS — D509 Iron deficiency anemia, unspecified: Secondary | ICD-10-CM | POA: Diagnosis not present

## 2022-01-06 DIAGNOSIS — I70222 Atherosclerosis of native arteries of extremities with rest pain, left leg: Secondary | ICD-10-CM | POA: Diagnosis not present

## 2022-01-06 DIAGNOSIS — E1110 Type 2 diabetes mellitus with ketoacidosis without coma: Secondary | ICD-10-CM | POA: Diagnosis not present

## 2022-01-06 DIAGNOSIS — R6520 Severe sepsis without septic shock: Secondary | ICD-10-CM | POA: Diagnosis present

## 2022-01-06 DIAGNOSIS — Z6831 Body mass index (BMI) 31.0-31.9, adult: Secondary | ICD-10-CM | POA: Diagnosis not present

## 2022-01-06 DIAGNOSIS — E119 Type 2 diabetes mellitus without complications: Secondary | ICD-10-CM | POA: Diagnosis not present

## 2022-01-06 DIAGNOSIS — F17210 Nicotine dependence, cigarettes, uncomplicated: Secondary | ICD-10-CM | POA: Diagnosis not present

## 2022-01-06 DIAGNOSIS — R9431 Abnormal electrocardiogram [ECG] [EKG]: Secondary | ICD-10-CM | POA: Diagnosis not present

## 2022-01-06 DIAGNOSIS — Z8249 Family history of ischemic heart disease and other diseases of the circulatory system: Secondary | ICD-10-CM | POA: Diagnosis not present

## 2022-01-06 DIAGNOSIS — R739 Hyperglycemia, unspecified: Secondary | ICD-10-CM | POA: Diagnosis not present

## 2022-01-06 DIAGNOSIS — E1151 Type 2 diabetes mellitus with diabetic peripheral angiopathy without gangrene: Secondary | ICD-10-CM | POA: Diagnosis not present

## 2022-01-06 DIAGNOSIS — Z720 Tobacco use: Secondary | ICD-10-CM

## 2022-01-06 DIAGNOSIS — D62 Acute posthemorrhagic anemia: Secondary | ICD-10-CM | POA: Diagnosis not present

## 2022-01-06 HISTORY — PX: LOWER EXTREMITY ANGIOGRAPHY: CATH118251

## 2022-01-06 LAB — CBC
HCT: 24.8 % — ABNORMAL LOW (ref 36.0–46.0)
Hemoglobin: 6.4 g/dL — ABNORMAL LOW (ref 12.0–15.0)
MCH: 17.1 pg — ABNORMAL LOW (ref 26.0–34.0)
MCHC: 25.8 g/dL — ABNORMAL LOW (ref 30.0–36.0)
MCV: 66.3 fL — ABNORMAL LOW (ref 80.0–100.0)
Platelets: 349 10*3/uL (ref 150–400)
RBC: 3.74 MIL/uL — ABNORMAL LOW (ref 3.87–5.11)
RDW: 27 % — ABNORMAL HIGH (ref 11.5–15.5)
WBC: 15.3 10*3/uL — ABNORMAL HIGH (ref 4.0–10.5)
nRBC: 1 % — ABNORMAL HIGH (ref 0.0–0.2)

## 2022-01-06 LAB — BASIC METABOLIC PANEL
Anion gap: 11 (ref 5–15)
BUN: 13 mg/dL (ref 6–20)
CO2: 22 mmol/L (ref 22–32)
Calcium: 8.8 mg/dL — ABNORMAL LOW (ref 8.9–10.3)
Chloride: 98 mmol/L (ref 98–111)
Creatinine, Ser: 1.02 mg/dL — ABNORMAL HIGH (ref 0.44–1.00)
GFR, Estimated: >60 mL/min (ref >60)
Glucose, Bld: 112 mg/dL — ABNORMAL HIGH (ref 70–99)
Potassium: 3.9 mmol/L (ref 3.5–5.1)
Sodium: 131 mmol/L — ABNORMAL LOW (ref 135–145)

## 2022-01-06 LAB — URINALYSIS, COMPLETE (UACMP) WITH MICROSCOPIC
Bacteria, UA: NONE SEEN
Bilirubin Urine: NEGATIVE
Glucose, UA: NEGATIVE mg/dL
Ketones, ur: 5 mg/dL — AB
Leukocytes,Ua: NEGATIVE
Nitrite: NEGATIVE
Protein, ur: 100 mg/dL — AB
RBC / HPF: >50 RBC/hpf — ABNORMAL HIGH (ref 0–5)
Specific Gravity, Urine: >1.046 — ABNORMAL HIGH (ref 1.005–1.030)
pH: 5 (ref 5.0–8.0)

## 2022-01-06 LAB — HEPARIN LEVEL (UNFRACTIONATED): Heparin Unfractionated: 0.37 IU/mL (ref 0.30–0.70)

## 2022-01-06 LAB — MAGNESIUM: Magnesium: 2.5 mg/dL — ABNORMAL HIGH (ref 1.7–2.4)

## 2022-01-06 LAB — PROCALCITONIN
Procalcitonin: 0.35 ng/mL
Procalcitonin: 4.04 ng/mL

## 2022-01-06 LAB — GLUCOSE, CAPILLARY
Glucose-Capillary: 120 mg/dL — ABNORMAL HIGH (ref 70–99)
Glucose-Capillary: 128 mg/dL — ABNORMAL HIGH (ref 70–99)
Glucose-Capillary: 139 mg/dL — ABNORMAL HIGH (ref 70–99)
Glucose-Capillary: 161 mg/dL — ABNORMAL HIGH (ref 70–99)
Glucose-Capillary: 171 mg/dL — ABNORMAL HIGH (ref 70–99)
Glucose-Capillary: 248 mg/dL — ABNORMAL HIGH (ref 70–99)

## 2022-01-06 LAB — HIV ANTIBODY (ROUTINE TESTING W REFLEX): HIV Screen 4th Generation wRfx: NONREACTIVE

## 2022-01-06 LAB — CBG MONITORING, ED
Glucose-Capillary: 129 mg/dL — ABNORMAL HIGH (ref 70–99)
Glucose-Capillary: 145 mg/dL — ABNORMAL HIGH (ref 70–99)

## 2022-01-06 LAB — BETA-HYDROXYBUTYRIC ACID: Beta-Hydroxybutyric Acid: 1.94 mmol/L — ABNORMAL HIGH (ref 0.05–0.27)

## 2022-01-06 LAB — IRON AND TIBC
Iron: 15 ug/dL — ABNORMAL LOW (ref 28–170)
Saturation Ratios: 3 % — ABNORMAL LOW (ref 10.4–31.8)
TIBC: 487 ug/dL — ABNORMAL HIGH (ref 250–450)
UIBC: 472 ug/dL

## 2022-01-06 LAB — MRSA NEXT GEN BY PCR, NASAL: MRSA by PCR Next Gen: NOT DETECTED

## 2022-01-06 LAB — PHOSPHORUS: Phosphorus: 4.1 mg/dL (ref 2.5–4.6)

## 2022-01-06 LAB — LACTIC ACID, PLASMA
Lactic Acid, Venous: 1.9 mmol/L (ref 0.5–1.9)
Lactic Acid, Venous: 1.2 mmol/L (ref 0.5–1.9)

## 2022-01-06 LAB — HEMOGLOBIN AND HEMATOCRIT, BLOOD
HCT: 28.3 % — ABNORMAL LOW (ref 36.0–46.0)
Hemoglobin: 8 g/dL — ABNORMAL LOW (ref 12.0–15.0)

## 2022-01-06 LAB — TSH: TSH: 3.956 u[IU]/mL (ref 0.350–4.500)

## 2022-01-06 LAB — PREPARE RBC (CROSSMATCH)

## 2022-01-06 LAB — T4, FREE: Free T4: 0.98 ng/dL (ref 0.61–1.12)

## 2022-01-06 SURGERY — LOWER EXTREMITY ANGIOGRAPHY
Anesthesia: Moderate Sedation | Laterality: Left

## 2022-01-06 MED ORDER — MIDAZOLAM HCL 2 MG/2ML IJ SOLN
INTRAMUSCULAR | Status: DC | PRN
  Administered 2022-01-06: 1 mg via INTRAVENOUS
  Administered 2022-01-06: 2 mg via INTRAVENOUS

## 2022-01-06 MED ORDER — CEFAZOLIN SODIUM-DEXTROSE 2-4 GM/100ML-% IV SOLN: 2.0000 g | INTRAVENOUS | Status: DC

## 2022-01-06 MED ORDER — ONDANSETRON HCL 40 MG/20ML IJ SOLN: 8.0000 mg | Freq: Four times a day (QID) | INTRAMUSCULAR | Status: DC | PRN

## 2022-01-06 MED ORDER — IODIXANOL 320 MG/ML IV SOLN
INTRAVENOUS | Status: DC | PRN
  Administered 2022-01-06: 45 mL

## 2022-01-06 MED ORDER — POLYETHYLENE GLYCOL 3350 17 G PO PACK: 17.0000 g | PACK | Freq: Every day | ORAL | Status: DC | PRN

## 2022-01-06 MED ORDER — HYDROMORPHONE HCL 1 MG/ML IJ SOLN
1.0000 mg | Freq: Once | INTRAMUSCULAR | Status: AC
  Administered 2022-01-06: 1 mg via INTRAVENOUS
  Filled 2022-01-06: qty 1

## 2022-01-06 MED ORDER — SODIUM CHLORIDE 0.9 % IV SOLN: INTRAVENOUS | Status: DC

## 2022-01-06 MED ORDER — FENTANYL CITRATE PF 50 MCG/ML IJ SOSY
PREFILLED_SYRINGE | INTRAMUSCULAR | Status: AC
  Filled 2022-01-06: qty 2

## 2022-01-06 MED ORDER — ALTEPLASE 2 MG IJ SOLR
INTRAMUSCULAR | Status: AC
  Filled 2022-01-06: qty 8

## 2022-01-06 MED ORDER — ALTEPLASE 2 MG IJ SOLR
1.0000 mg/h | INTRAMUSCULAR | Status: AC
  Administered 2022-01-06: 1 mg/h
  Filled 2022-01-06: qty 10

## 2022-01-06 MED ORDER — LACTATED RINGERS IV BOLUS
1000.0000 mL | Freq: Once | INTRAVENOUS | Status: AC
Start: 2022-01-06 — End: 2022-01-06
  Administered 2022-01-06: 1000 mL via INTRAVENOUS

## 2022-01-06 MED ORDER — SODIUM CHLORIDE 0.9 % IV SOLN
0.5000 mg/h | INTRAVENOUS | Status: DC
  Administered 2022-01-06 – 2022-01-07 (×2): 0.5 mg/h
  Filled 2022-01-06 (×2): qty 10

## 2022-01-06 MED ORDER — CEFAZOLIN SODIUM-DEXTROSE 1-4 GM/50ML-% IV SOLN
INTRAVENOUS | Status: DC | PRN
  Administered 2022-01-06: 2 g via INTRAVENOUS

## 2022-01-06 MED ORDER — METOPROLOL TARTRATE 5 MG/5ML IV SOLN
5.0000 mg | Freq: Once | INTRAVENOUS | Status: AC
  Administered 2022-01-06: 5 mg via INTRAVENOUS
  Filled 2022-01-06: qty 5

## 2022-01-06 MED ORDER — LABETALOL HCL 5 MG/ML IV SOLN
INTRAVENOUS | Status: AC
  Filled 2022-01-06: qty 4

## 2022-01-06 MED ORDER — FENTANYL CITRATE (PF) 100 MCG/2ML IJ SOLN
INTRAMUSCULAR | Status: DC | PRN
  Administered 2022-01-06: 25 ug via INTRAVENOUS
  Administered 2022-01-06: 50 ug via INTRAVENOUS

## 2022-01-06 MED ORDER — MIDAZOLAM HCL 2 MG/2ML IJ SOLN
INTRAMUSCULAR | Status: AC
  Filled 2022-01-06: qty 4

## 2022-01-06 MED ORDER — HEPARIN (PORCINE) 25000 UT/250ML-% IV SOLN
400.0000 [IU]/h | INTRAVENOUS | Status: DC
  Administered 2022-01-06: 400 [IU]/h via INTRA_ARTERIAL

## 2022-01-06 MED ORDER — ONDANSETRON HCL 4 MG/2ML IJ SOLN
4.0000 mg | Freq: Four times a day (QID) | INTRAMUSCULAR | Status: DC | PRN
  Administered 2022-01-06: 4 mg via INTRAVENOUS
  Filled 2022-01-06 (×2): qty 2

## 2022-01-06 MED ORDER — DOCUSATE SODIUM 100 MG PO CAPS: 100.0000 mg | ORAL_CAPSULE | Freq: Two times a day (BID) | ORAL | Status: DC | PRN

## 2022-01-06 MED ORDER — ONDANSETRON HCL 4 MG/2ML IJ SOLN: 4.0000 mg | Freq: Four times a day (QID) | INTRAMUSCULAR | Status: DC | PRN

## 2022-01-06 MED ORDER — LABETALOL HCL 5 MG/ML IV SOLN
INTRAVENOUS | Status: DC | PRN
  Administered 2022-01-06: 20 mg via INTRAVENOUS

## 2022-01-06 MED ORDER — PANTOPRAZOLE SODIUM 40 MG IV SOLR
40.0000 mg | Freq: Two times a day (BID) | INTRAVENOUS | Status: DC
  Administered 2022-01-06 – 2022-01-09 (×7): 40 mg via INTRAVENOUS
  Filled 2022-01-06 (×7): qty 10

## 2022-01-06 MED ORDER — HEPARIN (PORCINE) 25000 UT/250ML-% IV SOLN
INTRAVENOUS | Status: AC
  Filled 2022-01-06: qty 250

## 2022-01-06 MED ORDER — HYDROMORPHONE HCL 1 MG/ML IJ SOLN
1.0000 mg | INTRAMUSCULAR | Status: DC | PRN
  Administered 2022-01-06 – 2022-01-09 (×12): 1 mg via INTRAVENOUS
  Filled 2022-01-06 (×12): qty 1

## 2022-01-06 MED ORDER — HEPARIN SODIUM (PORCINE) 1000 UNIT/ML IJ SOLN
INTRAMUSCULAR | Status: DC | PRN
  Administered 2022-01-06: 5000 [IU] via INTRAVENOUS

## 2022-01-06 MED ORDER — HEPARIN SODIUM (PORCINE) 1000 UNIT/ML IJ SOLN
INTRAMUSCULAR | Status: AC
  Filled 2022-01-06: qty 10

## 2022-01-06 MED ORDER — SODIUM CHLORIDE 0.9 % IV SOLN
1.0000 g | Freq: Once | INTRAVENOUS | Status: AC
  Administered 2022-01-06: 1 g via INTRAVENOUS
  Filled 2022-01-06: qty 10

## 2022-01-06 SURGICAL SUPPLY — 20 items
BALLN LUTONIX 018 4X40X130 (BALLOONS) ×2
BALLOON LUTONIX 018 4X40X130 (BALLOONS) IMPLANT
CANISTER PENUMBRA ENGINE (MISCELLANEOUS) ×1 IMPLANT
CATH ANGIO 5F PIGTAIL 65CM (CATHETERS) ×1 IMPLANT
CATH INDIGO CAT6 KIT (CATHETERS) ×1 IMPLANT
CATH INFUS 135CMX50CM (CATHETERS) ×1 IMPLANT
CATH VERT 5X100 (CATHETERS) ×1 IMPLANT
COVER PROBE U/S 5X48 (MISCELLANEOUS) ×1 IMPLANT
GLIDEWIRE ADV .035X260CM (WIRE) ×1 IMPLANT
KIT CV MULTILUMEN 7FR 20 (SET/KITS/TRAYS/PACK) ×2
KIT CV MULTILUMEN 7FR 20 SUB (SET/KITS/TRAYS/PACK) IMPLANT
KIT ENCORE 26 ADVANTAGE (KITS) ×1 IMPLANT
PACK ANGIOGRAPHY (CUSTOM PROCEDURE TRAY) ×2 IMPLANT
SHEATH BRITE TIP 5FRX11 (SHEATH) ×1 IMPLANT
SHEATH PINNACLE ST 6F 45CM (SHEATH) ×1 IMPLANT
SUT SILK 0 FSL (SUTURE) ×1 IMPLANT
SYR MEDRAD MARK 7 150ML (SYRINGE) ×1 IMPLANT
TUBING CONTRAST HIGH PRESS 72 (TUBING) ×1 IMPLANT
WIRE G V18X300CM (WIRE) ×1 IMPLANT
WIRE GUIDERIGHT .035X150 (WIRE) ×1 IMPLANT

## 2022-01-06 NOTE — Hospital Course (Addendum)
Taken from H&P and prior notes.  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   ED Course: In the emergency department, the temperature was 36.9C, the heart rate  170 beats/minute, the blood pressure 175/105 mm Hg, the respiratory rate 24 breaths/minute, and the oxygen saturation 100% on RA.  Pertinent Labs /Diagnostics Findings: Na+/ K+: 133/3.6, Glucose: 223, BUN/Cr.:12/1.16 ,CO2:17, Anion Gap:17, WBC/ TMAX: 21.5/ afebrile, Hgb/Hct: 5.6/23.2, PCT: 0.35, Lactic acid:1.9, CTA abdomen showed occlusion of the distal left superficial femoral artery at the level of the stent which extends into the popliteal artery with occluded stent otherwise no other acute findings in the abdomen and pelvis.   The laboratory data showed an anion gap, metabolic acidosis, and hyperglycemia consistent w/mild DKA.  Patient also with elevated white count and procal which was  concerning for possible underlying infection and hemoglobin of 5.6 with no obvious overt bleeding.  The management was initiated with IV heparin per vascular on-call surgeon's recommendations with the plan for possible intervention of ischemic LLE in the a.m.  Patient was also started on broad-spectrum antibiotics and received 2 unit of PRBC.  She was admitted under PCCM service.  Vascular surgery was consulted and she underwent left lower extremity angiogram followed by  Mechanical thrombectomy of the left SFA, popliteal artery, tibioperoneal trunk, and posterior tibial artery  Percutaneous transluminal angioplasty of the left  posterior tibial artery with 2.5 mm diameter angioplasty balloon  Percutaneous transluminal angioplasty of tibioperoneal trunk and distal popliteal artery with 4 mm diameter Lutonix drug-coated angioplasty balloon  Percutaneous transluminal angioplasty of the left SFA and popliteal artery with 5 mm diameter by 30 cm length Lutonix drug-coated angioplasty balloon  StarClose closure device right femoral artery.  TRH to resume care on 01/08/2022.  6/30: Patient remained stable.  Lower extremity pain improving, continue to have significant edema of left lower extremity.  Eliquis was restarted as advised by vascular surgery. She can be transitioned out of ICU. Hemoglobin at 6.9 today as she had significant blood loss during procedure yesterday, giving another unit of PRBC. Most likely be discharged tomorrow if remains stable.  7/1: Patient clinically stable with improvement in left leg pain and edema.  Still having difficulty with ambulation, she was able to ambulate with the help of walker on flat surfaces but taking stairs was a challenge per PT evaluation.  They were recommending going to rehab but patient does not have any insurance.  Patient was provided with some Eliquis to be used until Monday and she can get her medications then for medication management clinic. She was also given match letter for current medications to be picked up at [facility].  Patient was counseled again to be compliant with her medications to avoid more progression of her severe PAD requiring multiple interventions.  She will continue on current medications and need to have a close follow-up with vascular surgery and PCP for further recommendations.

## 2022-01-06 NOTE — H&P (Addendum)
History and Physical    Patient: Jocelyn [name] XOV:[id number] DOB: Sep 30, 1980 DOA: 01/05/2022 DOS: the patient was seen and examined on 01/06/2022 PCP: [facility]  Patient coming from: Home  Chief Complaint:  Chief Complaint  Patient presents with   Leg Pain    Pt with a hx of DVT's presents to the ED for evaluation of left leg pain.   HPI: Jocelyn [name] is a 41 y.o. female with medical history significant of DM II and anemia and critical limb ischemia. Pt coming for left leg pain that acutely started tonight at 9pm. Pain got progressively worse and she called EMS. Pt has been out of work for one month and has been out of all her meds.  She jsut started to work yesterday. CTA with runoff shows irregular clot extending lumen of artery and ischemia of left leg and foot with no circulation.  Vascular MD consulted by edmd and started on heparin GTT. With severe sepsis/ DKA/ severe anemia off antiplatelet - guaiac pending and pt's guarded prognosis requested CIU to admit pt for Critical limb ischemia and DKA and, acute anemia, severe sepsis.   Blood pressure (!) 175/105, pulse (!) 172, temperature 98.5 F (36.9 C), temperature source Oral, resp. rate (!) 24, height 5\' 5"  (1.651 m), weight 85.7 kg, SpO2 100 %.   Review of Systems  Musculoskeletal:        Leg pain.   All other systems reviewed and are negative.   Past Medical History:  Diagnosis Date   IDA (iron deficiency anemia) 12/17/2019   Past Surgical History:  Procedure Laterality Date   APPENDECTOMY     LOWER EXTREMITY ANGIOGRAPHY Left 01/11/2020   Procedure: Lower Extremity Angiography;  Surgeon: 03/13/2020, MD;  Location: [facility] INVASIVE CV LAB;  Service: Cardiovascular;  Laterality: Left;   LOWER EXTREMITY ANGIOGRAPHY Left 03/06/2020   Procedure: Lower Extremity Angiography;  Surgeon: 03/08/2020, MD;  Location: [facility] INVASIVE CV LAB;  Service: Cardiovascular;  Laterality: Left;   LOWER EXTREMITY  ANGIOGRAPHY Left 03/07/2020   Procedure: Lower Extremity Angiography;  Surgeon: 03/09/2020, MD;  Location: [facility] INVASIVE CV LAB;  Service: Cardiovascular;  Laterality: Left;   Social History:  reports that she has been smoking cigarettes. She has been smoking an average of .5 packs per day. She has never used smokeless tobacco. She reports current alcohol use. She reports that she does not use drugs.  No Known Allergies  Family History  Problem Relation Age of Onset   Hypertension Mother    Hypertension Sister    Hypertension Brother     Prior to Admission medications   Medication Sig Start Date End Date Taking? Authorizing Provider  amLODipine (NORVASC) 5 MG tablet Take 5 mg by mouth daily. 09/06/19   [provider]  apixaban (ELIQUIS) 5 MG TABS tablet Take 1 tablet (5 mg total) by mouth 2 (two) times daily. 01/12/20   03/14/20, MD  Ferrous Sulfate (IRON) 325 (65 Fe) MG TABS Take 1 tablet (325 mg total) by mouth daily. 12/17/19 03/06/20  03/08/20, MD  gabapentin (NEURONTIN) 300 MG capsule Take 300 mg by mouth 3 (three) times daily. 12/21/19   [provider]  nicotine (NICODERM CQ - DOSED IN MG/24 HOURS) 21 mg/24hr patch Place 1 patch (21 mg total) onto the skin daily. 01/12/20   03/14/20, MD    Physical Exam: Vitals:   01/05/22 2217 01/05/22 2222 01/05/22 2315 01/06/22 0000  BP: 01/08/22)  133/91  (!) 180/114 (!) 175/105  Pulse: (!) 166  (!) 170 (!) 172  Resp: 18  18 (!) 24  Temp: 98.5 F (36.9 C)     TempSrc: Oral     SpO2: 100%  100% 100%  Weight:  85.7 kg    Height:  5\' 5"  (1.651 m)    Physical Exam Vitals reviewed.  Constitutional:      Appearance: She is ill-appearing.  HENT:     Head: Normocephalic and atraumatic.     Right Ear: External ear normal.     Left Ear: External ear normal.     Nose: Nose normal.     Mouth/Throat:     Mouth: Mucous membranes are moist.  Eyes:     Extraocular Movements: Extraocular movements intact.     Pupils:  Pupils are equal, round, and reactive to light.  Neck:     Vascular: No carotid bruit.  Cardiovascular:     Rate and Rhythm: Normal rate and regular rhythm.     Pulses:          Dorsalis pedis pulses are 0 on the right side and 0 on the left side.       Posterior tibial pulses are 0 on the right side and 0 on the left side.  Pulmonary:     Effort: Pulmonary effort is normal.     Breath sounds: Normal breath sounds.  Abdominal:     General: Bowel sounds are normal.     Palpations: Abdomen is soft.  Musculoskeletal:        General: Swelling present.     Right lower leg: No edema.     Left lower leg: Edema present.  Skin:    General: Skin is warm.  Neurological:     General: No focal deficit present.     Mental Status: She is alert and oriented to person, place, and time.  Psychiatric:        Mood and Affect: Mood normal.        Behavior: Behavior normal.     Data Reviewed: Results for orders placed or performed during the hospital encounter of 01/05/22 (from the past 24 hour(s))  CBC with Differential     Status: Abnormal   Collection Time: 01/05/22 10:34 PM  Result Value Ref Range   WBC 21.5 (H) 4.0 - 10.5 K/uL   RBC 3.71 (L) 3.87 - 5.11 MIL/uL   Hemoglobin 5.6 (L) 12.0 - 15.0 g/dL   HCT 01/07/22 (L) 61.4 - 43.1 %   MCV 62.5 (L) 80.0 - 100.0 fL   MCH 15.1 (L) 26.0 - 34.0 pg   MCHC 24.1 (L) 30.0 - 36.0 g/dL   RDW 54.0 (H) 08.6 - 76.1 %   Platelets 404 (H) 150 - 400 K/uL   nRBC 0.8 (H) 0.0 - 0.2 %   Neutrophils Relative % 84 %   Neutro Abs 18.0 (H) 1.7 - 7.7 K/uL   Lymphocytes Relative 8 %   Lymphs Abs 1.6 0.7 - 4.0 K/uL   Monocytes Relative 7 %   Monocytes Absolute 1.5 (H) 0.1 - 1.0 K/uL   Eosinophils Relative 0 %   Eosinophils Absolute 0.1 0.0 - 0.5 K/uL   Basophils Relative 0 %   Basophils Absolute 0.1 0.0 - 0.1 K/uL   WBC Morphology MORPHOLOGY UNREMARKABLE    RBC Morphology MORPHOLOGY UNREMARKABLE    Smear Review MORPHOLOGY UNREMARKABLE    Immature Granulocytes 1  %   Abs Immature Granulocytes 0.18 (H)  0.00 - 0.07 K/uL  Comprehensive metabolic panel     Status: Abnormal   Collection Time: 01/05/22 10:34 PM  Result Value Ref Range   Sodium 133 (L) 135 - 145 mmol/L   Potassium 3.6 3.5 - 5.1 mmol/L   Chloride 99 98 - 111 mmol/L   CO2 17 (L) 22 - 32 mmol/L   Glucose, Bld 223 (H) 70 - 99 mg/dL   BUN 12 6 - 20 mg/dL   Creatinine, Ser 6.21 (H) 0.44 - 1.00 mg/dL   Calcium 9.0 8.9 - 30.8 mg/dL   Total Protein 7.9 6.5 - 8.1 g/dL   Albumin 3.6 3.5 - 5.0 g/dL   AST 25 15 - 41 U/L   ALT 16 0 - 44 U/L   Alkaline Phosphatase 74 38 - 126 U/L   Total Bilirubin 0.8 0.3 - 1.2 mg/dL   GFR, Estimated >65 >78 mL/min   Anion gap 17 (H) 5 - 15  Protime-INR     Status: Abnormal   Collection Time: 01/05/22 10:34 PM  Result Value Ref Range   Prothrombin Time 15.5 (H) 11.4 - 15.2 seconds   INR 1.2 0.8 - 1.2  APTT     Status: None   Collection Time: 01/05/22 10:34 PM  Result Value Ref Range   aPTT 30 24 - 36 seconds  hCG, quantitative, pregnancy     Status: None   Collection Time: 01/05/22 10:34 PM  Result Value Ref Range   hCG, Beta Chain, Quant, S 1 <5 mIU/mL  Heparin level (unfractionated)     Status: Abnormal   Collection Time: 01/05/22 10:34 PM  Result Value Ref Range   Heparin Unfractionated <0.10 (L) 0.30 - 0.70 IU/mL  Beta-hydroxybutyric acid     Status: Abnormal   Collection Time: 01/05/22 10:34 PM  Result Value Ref Range   Beta-Hydroxybutyric Acid 1.94 (H) 0.05 - 0.27 mmol/L  CK     Status: None   Collection Time: 01/05/22 10:34 PM  Result Value Ref Range   Total CK 79 38 - 234 U/L  Magnesium     Status: Abnormal   Collection Time: 01/05/22 10:34 PM  Result Value Ref Range   Magnesium 1.6 (L) 1.7 - 2.4 mg/dL  Iron and TIBC     Status: Abnormal   Collection Time: 01/05/22 10:34 PM  Result Value Ref Range   Iron 15 (L) 28 - 170 ug/dL   TIBC 469 (H) 629 - 528 ug/dL   Saturation Ratios 3 (L) 10.4 - 31.8 %   UIBC 472 ug/dL  Procalcitonin -  Baseline     Status: None   Collection Time: 01/05/22 10:34 PM  Result Value Ref Range   Procalcitonin 0.35 ng/mL  CBG monitoring, ED     Status: Abnormal   Collection Time: 01/05/22 11:47 PM  Result Value Ref Range   Glucose-Capillary 167 (H) 70 - 99 mg/dL  Prepare RBC (crossmatch)     Status: None (Preliminary result)   Collection Time: 01/05/22 11:54 PM  Result Value Ref Range   Order Confirmation PENDING   Lactic acid, plasma     Status: None   Collection Time: 01/05/22 11:54 PM  Result Value Ref Range   Lactic Acid, Venous 1.9 0.5 - 1.9 mmol/L  Type and screen     Status: None (Preliminary result)   Collection Time: 01/05/22 11:54 PM  Result Value Ref Range   ABO/RH(D) B POS    Antibody Screen PENDING    Sample Expiration  01/08/2022,2359 Performed at [facility], [address] [place], [place] [address]             Assessment and Plan: No notes have been filed under this hospital service. Service: Hospitalist  ICU admission request to [facility] for critical limb ischemia/ anemia/ DKA/severe sepsis.    Advance Care Planning:    Code Status: Prior   Consults:  Vascular consult: Dr.Dew- EDMD contacted provider.   Family Communication:  Tawny Hopping (Mother)  [phone] (Mobile)  Severity of Illness: The appropriate patient status for this patient is INPATIENT. Inpatient status is judged to be reasonable and necessary in order to provide the required intensity of service to ensure the patient's safety. The patient's presenting symptoms, physical exam findings, and initial radiographic and laboratory data in the context of their chronic comorbidities is felt to place them at high risk for further clinical deterioration. Furthermore, it is not anticipated that the patient will be medically stable for discharge from the hospital within 2 midnights of admission.   * I certify that at the point of admission it is my clinical judgment that the  patient will require inpatient hospital care spanning beyond 2 midnights from the point of admission due to high intensity of service, high risk for further deterioration and high frequency of surveillance required.*  Author: Gertha Calkin, MD 01/06/2022 1:01 AM  For on call review [email or web address].

## 2022-01-06 NOTE — H&P (Addendum)
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  ED Course: In the emergency department, the temperature was 36.9C, the heart rate  170 beats/minute, the blood pressure 175/105 mm Hg, the respiratory rate 24 breaths/minute, and the oxygen saturation 100% on RA.  Pertinent Labs /Diagnostics Findings: Na+/ K+: 133/3.6, Glucose: 223, BUN/Cr.:12/1.16 ,CO2:17, Anion Gap:17, WBC/ TMAX: 21.5/ afebrile, Hgb/Hct: 5.6/23.2, PCT: 0.35, Lactic acid:1.9, CTA abdomen showed occlusion of the distal left superficial femoral artery at the level of the stent which extends into the popliteal artery with occluded stent otherwise no other acute findings in the abdomen and pelvis.  The laboratory data showed an anion gap, metabolic acidosis, and hyperglycemia consistent w/mild DKA.  Patient also with elevated white count and procal which was  concerning for possible underlying infection and hemoglobin of 5.6 with no obvious overt bleeding.  The management was initiated with IV heparin per vascular on-call surgeon's recommendations with the plan for possible intervention of ischemic LLE in the a.m.  Patient was also started on broad-spectrum antibiotics and will be transfused with 2 units of PRBC in addition to IV fluids.  PCCM consulted for admission.  Past Medical History  DM HTN PAD w/rest s/p LLE Angiogram  with stent Iron Deficiency Anemia  Significant Hospital Events   6/27: Admit to stepdown with Acute on Chronic Ischemic LLE  Consults:  Vascular  Procedures:  None  Significant Diagnostic Tests:  6/27: Chest Xray>No acute cardiopulmonary process 6/27: CTA abdomen and pelvis>Irregular plaque in the left common iliac artery extends into the lumen and could be a source of distal emboli. Occlusion of the distal left superficial femoral artery at the level of the stent which extends into the left popliteal artery. No flow seen within the stent. No flow seen within the trifurcation vessels in the left calf.  Micro Data:  6/27: SARS-CoV-2 PCR> negative 6/27: Blood culture x2> 6/27: Urine Culture> 6/28: MRSA PCR>>   Antimicrobials:  Vancomycin Cefepime Azithromycin Ceftriaxone Metronidazole  OBJECTIVE  Blood pressure 122/69, pulse (!) 147, temperature 98.5 F (36.9 C), temperature source Oral, resp. rate (!) 23, height 5\' 5"  (1.651 m), weight 85.7 kg, SpO2 99 %.       No intake or output data in the 24 hours ending 01/06/22 0142 Filed Weights   01/05/22 2222  Weight: 85.7 kg   Physical Examination  GENERAL: 41 year-old critically ill patient lying in the bed with no acute distress.  EYES: Pupils equal, round, reactive to light and accommodation. No scleral icterus. Extraocular muscles intact.  HEENT: Head atraumatic, normocephalic. Oropharynx and nasopharynx clear.  NECK:  Supple, no jugular venous distention. No thyroid enlargement, no tenderness.  LUNGS: Normal breath sounds bilaterally, no wheezing, rales,rhonchi or crepitation. No use of accessory muscles of respiration.  CARDIOVASCULAR: S1, S2 normal. No murmurs, rubs, or gallops.  ABDOMEN: Soft, nontender, nondistended. Bowel sounds present. No organomegaly or mass.  EXTREMITIES: Bilateral  LE edema, cyanosis. unable to palpate left DP & left PT pulses NEUROLOGIC: Cranial nerves II through XII are intact.  Muscle strength  5/5 in all extremities. Sensation intact. Gait not checked.  PSYCHIATRIC: The patient is alert and oriented x 3.  SKIN: see below      Labs/imaging that I havepersonally reviewed  (right click and "Reselect all SmartList Selections" daily)     Labs   CBC: Recent Labs  Lab 01/05/22 2234  WBC 21.5*  NEUTROABS 18.0*  HGB 5.6*  HCT 23.2*  MCV 62.5*  PLT 404*    Basic Metabolic Panel: Recent Labs  Lab 01/05/22 2234  NA 133*  K 3.6  CL 99  CO2 17*  GLUCOSE 223*  BUN 12  CREATININE 1.16*  CALCIUM 9.0  MG 1.6*   GFR: Estimated Creatinine Clearance: 68.3 mL/min (A) (by C-G formula based on SCr of 1.16 mg/dL (H)). Recent Labs  Lab 01/05/22 2234 01/05/22 2354  PROCALCITON 0.35  --   WBC 21.5*  --   LATICACIDVEN  --  1.9    Liver Function Tests: Recent Labs  Lab 01/05/22 2234  AST 25  ALT 16  ALKPHOS 74  BILITOT 0.8  PROT 7.9  ALBUMIN 3.6   No results for input(s): "LIPASE", "AMYLASE" in the last 168 hours. No results for input(s): "AMMONIA" in the last 168 hours.  ABG No results found for: "PHART", "PCO2ART", "PO2ART", "HCO3", "TCO2", "ACIDBASEDEF", "O2SAT"   Coagulation Profile: Recent Labs  Lab 01/05/22 2234  INR 1.2    Cardiac Enzymes: Recent Labs  Lab 01/05/22 2234  CKTOTAL 79    HbA1C: Hgb A1c MFr Bld  Date/Time Value Ref Range Status  03/07/2020 09:46 PM 5.0 4.8 - 5.6 % Final    Comment:    (NOTE) Pre diabetes:          5.7%-6.4%  Diabetes:              >6.4%  Glycemic control for   <7.0% adults with diabetes   01/10/2020 12:35 PM 4.6 (L) 4.8 - 5.6 % Final    Comment:    (NOTE)         Prediabetes: 5.7 - 6.4         Diabetes: >6.4         Glycemic control for adults with diabetes: <7.0     CBG: Recent Labs  Lab 01/05/22 2347  GLUCAP 167*    Review of Systems:   REVIEW OF SYSTEMS:  Positives in BOLD Constitutional: Negative for fever, chills, weight loss, malaise/fatigue and diaphoresis.  HENT: Negative for  hearing loss, ear pain, nosebleeds, congestion, sore throat, neck pain, tinnitus and ear discharge.   Eyes: Negative for blurred vision, double vision, photophobia, pain, discharge and redness.  Respiratory: Negative for cough, hemoptysis, sputum production, shortness of breath, wheezing and stridor.   Cardiovascular: Negative for chest pain, palpitations, orthopnea, claudication, +leg swelling, + rest pain, and PND.  Gastrointestinal: Negative for heartburn, nausea, vomiting, abdominal pain, diarrhea, constipation, blood in stool and melena.  Genitourinary: Negative for dysuria, urgency, frequency, hematuria and flank pain.  Musculoskeletal: Negative for myalgias, back pain, joint pain and falls.  Skin: Negative for itching and rash.  Neurological: Negative for dizziness, tingling, tremors, sensory change, speech change, focal weakness, seizures, loss of consciousness, weakness and headaches.  Endo/Heme/Allergies: Negative for environmental allergies and polydipsia. Does not bruise/bleed easily.  Past Medical History  She,  has a past medical history of IDA (iron deficiency anemia) (12/17/2019).  Surgical History    Past Surgical History:  Procedure Laterality Date   APPENDECTOMY     LOWER EXTREMITY ANGIOGRAPHY Left 01/11/2020   Procedure: Lower Extremity Angiography;  Surgeon: Annice Needy, MD;  Location: [facility] INVASIVE CV LAB;  Service: Cardiovascular;  Laterality: Left;   LOWER EXTREMITY ANGIOGRAPHY Left 03/06/2020   Procedure: Lower Extremity Angiography;  Surgeon: Annice Needy, MD;  Location: [facility] INVASIVE CV LAB;  Service: Cardiovascular;  Laterality: Left;   LOWER EXTREMITY ANGIOGRAPHY Left 03/07/2020   Procedure: Lower Extremity Angiography;  Surgeon: Annice Needy, MD;  Location: [facility] INVASIVE CV LAB;  Service: Cardiovascular;  Laterality: Left;     Social History   reports that she has been smoking cigarettes. She has been smoking an average of .5 packs per day. She has never used  smokeless tobacco. She reports current alcohol use. She reports that she does not use drugs.   Family History   Her family history includes Hypertension in her brother, mother, and sister.   Allergies No Known Allergies   Home Medications  Prior to Admission medications   Medication Sig Start Date End Date Taking? Authorizing Provider  amLODipine (NORVASC) 5 MG tablet Take 5 mg by mouth daily. 09/06/19   [provider]  apixaban (ELIQUIS) 5 MG TABS tablet Take 1 tablet (5 mg total) by mouth 2 (two) times daily. 01/12/20   Enedina Finner, MD  Ferrous Sulfate (IRON) 325 (65 Fe) MG TABS Take 1 tablet (325 mg total) by mouth daily. 12/17/19 03/06/20  Sharman Cheek, MD  gabapentin (NEURONTIN) 300 MG capsule Take 300 mg by mouth 3 (three) times daily. 12/21/19   [provider]  nicotine (NICODERM CQ - DOSED IN MG/24 HOURS) 21 mg/24hr patch Place 1 patch (21 mg total) onto the skin daily. 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   Sepsis of unknown source Meets SIRS Criteria  -Monitor fever curve -Trend WBC's & Procalcitonin -Follow cultures as above -Continue empiric abx pending cultures   Severe Symptomatic Anemia - Patient has hx of iron deficiency anemia. Hgb 5.6 on admission without evidence of bleeding s/p 2 units of PRBCs -Check iron and TIBC, ferritin, B12, folate, and heavy metals -IVF resuscitation to maintain MAP>65 -H&H monitoring q6h -Transfuse PRN Hgb<8 -Pantoprazole 40mg  IV BID -Keep NPO  -GI Consult if appropriate or with worsening symptoms -Hold NSAIDs, steroids, ASA   Mild AKI  Hyponatremia ? pseudohyponatremia in the setting of DKA correct for hyperglycemia Hypomagnesia -Monitor  I&O's / urinary output -Follow BMP -Ensure adequate renal perfusion -Avoid nephrotoxic agents as able -Replace electrolytes as indicated -Pharmacy following for assistance with electrolyte replacement  Mild DKA Diabetes mellitus HgbA1c pending -CXR, UA negative, Blood  and urine Cultures for infection pending -Hold insulin gtt given mild presentation -CBG's Q4 with SSI; Target range of 140 to 180 -Follow ICU Hypo/Hyperglycemia protocol -Hold Metformin -Diabetes coordinator consult    Best practice:  Diet:  NPO Pain/Anxiety/Delirium protocol (if indicated): No VAP protocol (if indicated): Not indicated DVT prophylaxis: Systemic AC GI prophylaxis: PPI Glucose control:  SSI Yes Central venous access:  N/A Arterial line:  N/A Foley:  Yes, and it is still needed Mobility:  bed rest  PT consulted: N/A Last date of multidisciplinary goals of care discussion [6/28] Code Status:  full code Disposition: Stepdown   = Goals of Care = Code Status Order: FULL  Primary Emergency Contact: jones,laura Wishes to pursue full aggressive treatment and intervention options, including CPR and intubation, but goals of care will be addressed  on going with family if that should become necessary.   Critical care time: 45 minutes     Webb Silversmith, DNP, CCRN, FNP-C, AGACNP-BC Acute Care Nurse Practitioner  [place] [facility] Pager: [phone] [place] at [facility]

## 2022-01-06 NOTE — Progress Notes (Signed)
ANTICOAGULATION CONSULT NOTE   Pharmacy Consult for heparin infuison Indication: VTE prophylaxis  No Known Allergies  Patient Measurements: Height: 5\' 5"  (165.1 cm) Weight: 85.7 kg (189 lb) IBW/kg (Calculated) : 57 Heparin Dosing Weight: 75.6 kg  Vital Signs: Temp: 98.7 F (37.1 C) (06/28 0906) Temp Source: Oral (06/28 0906) BP: 185/110 (06/28 0906) Pulse Rate: 118 (06/28 0906)  Labs: Recent Labs    01/05/22 2234 01/06/22 0537 01/06/22 0600  HGB 5.6* 6.4*  --   HCT 23.2* 24.8*  --   PLT 404* 349  --   APTT 30  --   --   LABPROT 15.5*  --   --   INR 1.2  --   --   HEPARINUNFRC <0.10*  --  0.37  CREATININE 1.16* 1.02*  --   CKTOTAL 79  --   --     Estimated Creatinine Clearance: 77.7 mL/min (A) (by C-G formula based on SCr of 1.02 mg/dL (H)).   Medical History: Past Medical History:  Diagnosis Date   IDA (iron deficiency anemia) 12/17/2019    Medications:  PTA Meds: Eliquis 5 mg BID, however BL HL < 0.1  Assessment: Pt is 41 yo female w/ "hx of clots and stent placement in the lower extremities brought by EMS for left lower leg pain." CTA with runoff shows irregular clot extending lumen of artery and ischemia of left leg and foot with no circulation  Goal of Therapy:  Heparin level 0.3-0.7 units/ml Monitor platelets by anticoagulation protocol: Yes  Plan:  Heparin level therapeutic: continue heparin infusion at 1250 units/hr Next heparin level in 6 hours to confirm CBC daily while on heparin  45, PharmD, BCPS 01/06/2022 9:42 AM

## 2022-01-06 NOTE — Progress Notes (Signed)
PHARMACY CONSULT NOTE  Pharmacy Consult for Electrolyte Monitoring and Replacement   Recent Labs: Potassium (mmol/L)  Date Value  01/06/2022 3.9  04/24/2013 4.4   Magnesium (mg/dL)  Date Value  00/17/4944 2.5 (H)   Calcium (mg/dL)  Date Value  96/75/9163 8.8 (L)   Calcium, Total (mg/dL)  Date Value  84/66/5993 9.1   Albumin (g/dL)  Date Value  57/07/7791 3.6  04/24/2013 4.1   Phosphorus (mg/dL)  Date Value  90/30/0923 4.1   Sodium (mmol/L)  Date Value  01/06/2022 131 (L)  04/24/2013 134 (L)   Assessment: 41 yo female w/ "hx of clots and stent placement in the lower extremities brought by EMS for left lower leg pain." CTA shows irregular clot extending lumen of artery and ischemia of left leg and foot with no circulation now on heparin drip admitted to CCU. Pharmacy is asked to follow and replace electrolytes while in   Goal of Therapy:  Electrolytes WNL  Plan:  No electrolyte replacement warranted for today Recheck electrolytes in am 6/29  Lowella Bandy ,PharmD Clinical Pharmacist 01/06/2022 9:52 AM

## 2022-01-06 NOTE — Consult Note (Signed)
[facility] Vascular Consult Note  MRN : [id number]  Jocelyn [name] is a 41 y.o. (02/04/1981) female who presents with chief complaint of  Chief Complaint  Patient presents with   Leg Pain    Pt with a hx of DVT's presents to the ED for evaluation of left leg pain.  .  History of Present Illness: Patient presents to the emergency department last night and we are asked to evaluate the patient for ischemia of the left lower extremity.  The patient has a known severe history of arterial insufficiency with multiple previous interventions, the last of which was in 2021.  Despite this, she continues to smoke and she lost her insurance and had to stop taking Eliquis a month ago.  Shortly after stopping the Eliquis, her left foot and leg began to hurt.  This has now been hurting for nearly a month.  She presented to the emergency department last night with an insensate painful left foot.  The leg began swelling within the past few days.  No right leg symptoms.  The emergency department ordered a CT angiogram which confirmed the suspicion of an occluded left SFA and popliteal stent with poor runoff distally.  Current Facility-Administered Medications  Medication Dose Route Frequency Provider Last Rate Last Admin   [MAR Hold] 0.9 %  sodium chloride infusion (Manually program via Guardrails IV Fluids)   Intravenous Once Smith, Zachary P, MD       0.9 %  sodium chloride infusion   Intravenous Continuous Abundio Teuscher S, MD       ceFAZolin (ANCEF) IVPB 2g/100 mL premix  2 g Intravenous 30 min Pre-Op Chastin Garlitz S, MD       [MAR Hold] docusate sodium (COLACE) capsule 100 mg  100 mg Oral BID PRN Ouma, Elizabeth Achieng, NP       heparin ADULT infusion 100 units/mL (25000 units/250mL)  1,250 Units/hr Intravenous Continuous Belue, Nathan S, RPH 12.5 mL/hr at 01/06/22 0027 1,250 Units/hr at 01/06/22 0027   [MAR Hold] HYDROmorphone (DILAUDID) injection 1 mg  1 mg Intravenous Q1H PRN Kasa,  Kurian, MD   1 mg at 01/06/22 1037   [MAR Hold] insulin aspart (novoLOG) injection 0-15 Units  0-15 Units Subcutaneous Q4H Smith, Zachary P, MD   2 Units at 01/06/22 0908   [MAR Hold] ondansetron (ZOFRAN) injection 4 mg  4 mg Intravenous Q6H PRN Grubb, Rodney D, RPH   4 mg at 01/06/22 0830   Or   [MAR Hold] ondansetron (ZOFRAN) 8 mg in sodium chloride 0.9 % 50 mL IVPB  8 mg Intravenous Q6H PRN Grubb, Rodney D, RPH       [MAR Hold] pantoprazole (PROTONIX) injection 40 mg  40 mg Intravenous Q12H Patel, Ekta V, MD   40 mg at 01/06/22 0202   [MAR Hold] polyethylene glycol (MIRALAX / GLYCOLAX) packet 17 g  17 g Oral Daily PRN Ouma, Elizabeth Achieng, NP        Past Medical History:  Diagnosis Date   IDA (iron deficiency anemia) 12/17/2019    Past Surgical History:  Procedure Laterality Date   APPENDECTOMY     LOWER EXTREMITY ANGIOGRAPHY Left 01/11/2020   Procedure: Lower Extremity Angiography;  Surgeon: Lynzee Lindquist S, MD;  Location: [facility] INVASIVE CV LAB;  Service: Cardiovascular;  Laterality: Left;   LOWER EXTREMITY ANGIOGRAPHY Left 03/06/2020   Procedure: Lower Extremity Angiography;  Surgeon: Patsey Pitstick S, MD;  Location: [facility] INVASIVE CV LAB;  Service: Cardiovascular;  Laterality:   Left;   LOWER EXTREMITY ANGIOGRAPHY Left 03/07/2020   Procedure: Lower Extremity Angiography;  Surgeon: Wallie Lagrand S, MD;  Location: [facility] INVASIVE CV LAB;  Service: Cardiovascular;  Laterality: Left;     Social History   Tobacco Use   Smoking status: Every Day    Packs/day: 0.50    Types: Cigarettes   Smokeless tobacco: Never  Vaping Use   Vaping Use: Never used  Substance Use Topics   Alcohol use: Yes   Drug use: Never     Family History  Problem Relation Age of Onset   Hypertension Mother    Hypertension Sister    Hypertension Brother     No Known Allergies   REVIEW OF SYSTEMS (Negative unless checked)  Constitutional: []Weight loss  []Fever  []Chills Cardiac: []Chest pain   []Chest pressure    []Palpitations   []Shortness of breath when laying flat   []Shortness of breath at rest   []Shortness of breath with exertion. Vascular:  []Pain in legs with walking   []Pain in legs at rest   []Pain in legs when laying flat   []Claudication   []Pain in feet when walking  [x]Pain in feet at rest  [x]Pain in feet when laying flat   []History of DVT   []Phlebitis   []Swelling in legs   []Varicose veins   []Non-healing ulcers Pulmonary:   []Uses home oxygen   []Productive cough   []Hemoptysis   []Wheeze  []COPD   []Asthma Neurologic:  []Dizziness  []Blackouts   []Seizures   []History of stroke   []History of TIA  []Aphasia   []Temporary blindness   []Dysphagia   []Weakness or numbness in arms   [x]Weakness or numbness in legs Musculoskeletal:  []Arthritis   []Joint swelling   []Joint pain   []Low back pain Hematologic:  []Easy bruising  []Easy bleeding   []Hypercoagulable state   [x]Anemic  []Hepatitis Gastrointestinal:  []Blood in stool   []Vomiting blood  []Gastroesophageal reflux/heartburn   []Difficulty swallowing. Genitourinary:  []Chronic kidney disease   []Difficult urination  []Frequent urination  []Burning with urination   []Blood in urine Skin:  []Rashes   []Ulcers   []Wounds Psychological:  []History of anxiety   [] History of major depression.  Physical Examination  Vitals:   01/06/22 0945 01/06/22 1000 01/06/22 1031 01/06/22 1100  BP: 135/62 138/72 123/68 114/74  Pulse: 64 (!) 110 (!) 120   Resp: 20 12 18   Temp:   99 F (37.2 C) 99.1 F (37.3 C)  TempSrc:   Oral Oral  SpO2: 100% 99% 98% 97%  Weight:      Height:       Body mass index is 31.45 kg/m. Gen:  WD/WN, NAD Head: [place]/AT, No temporalis wasting.  Ear/Nose/Throat: Hearing grossly intact, nares w/o erythema or drainage, oropharynx w/o Erythema/Exudate Eyes: Sclera non-icteric, conjunctiva clear Neck: Trachea midline.  No JVD.  Pulmonary:  Good air movement, respirations not labored, equal bilaterally.  Cardiac: RRR,  normal S1, S2. Vascular:  Vessel Right Left  Radial Palpable Palpable                          PT Palpable Not Palpable  DP Palpable Not Palpable   Gastrointestinal: soft, non-tender/non-distended. No guarding/reflex.  Musculoskeletal: M/S 5/5 throughout.  2+ LLE edema. Left foot with diminished sensation and cool with sluggish capillary refill. Neurologic: Sensation diminished in LLE.  Symmetrical.  Speech is fluent. Motor exam as   listed above. Psychiatric: Judgment intact, Mood & affect appropriate for pt's clinical situation. Dermatologic: No rashes or ulcers noted.  No cellulitis or open wounds.      CBC Lab Results  Component Value Date   WBC 15.3 (H) 01/06/2022   HGB 6.4 (L) 01/06/2022   HCT 24.8 (L) 01/06/2022   MCV 66.3 (L) 01/06/2022   PLT 349 01/06/2022    BMET    Component Value Date/Time   NA 131 (L) 01/06/2022 0537   NA 134 (L) 04/24/2013 1557   K 3.9 01/06/2022 0537   K 4.4 04/24/2013 1557   CL 98 01/06/2022 0537   CL 105 04/24/2013 1557   CO2 22 01/06/2022 0537   CO2 24 04/24/2013 1557   GLUCOSE 112 (H) 01/06/2022 0537   GLUCOSE 103 (H) 04/24/2013 1557   BUN 13 01/06/2022 0537   BUN 7 04/24/2013 1557   CREATININE 1.02 (H) 01/06/2022 0537   CREATININE 0.51 (L) 04/24/2013 1557   CALCIUM 8.8 (L) 01/06/2022 0537   CALCIUM 9.1 04/24/2013 1557   GFRNONAA >60 01/06/2022 0537   GFRNONAA >60 04/24/2013 1557   GFRAA >60 03/09/2020 0438   GFRAA >60 04/24/2013 1557   Estimated Creatinine Clearance: 77.7 mL/min (A) (by C-G formula based on SCr of 1.02 mg/dL (H)).  COAG Lab Results  Component Value Date   INR 1.2 01/05/2022   INR 1.0 03/06/2020   INR 1.0 01/10/2020    Radiology DG Chest Portable 1 View  Result Date: 01/05/2022 CLINICAL DATA:  Tachycardia. EXAM: PORTABLE CHEST 1 VIEW COMPARISON:  03/07/2020, lung bases from abdominal CT earlier today. FINDINGS: The cardiomediastinal contours are normal. The lungs are clear. Pulmonary vasculature  is normal. No consolidation, pleural effusion, or pneumothorax. No acute osseous abnormalities are seen. IMPRESSION: No acute chest findings. Electronically Signed   By: Narda Rutherford M.D.   On: 01/05/2022 23:48   CT Angio Aortobifemoral W and/or Wo Contrast  Result Date: 01/05/2022 CLINICAL DATA:  Arterial embolism, lower extremity. LEFT LOWER LEG PAIN EXAM: CT ANGIOGRAPHY OF ABDOMINAL AORTA WITH ILIOFEMORAL RUNOFF TECHNIQUE: Multidetector CT imaging of the abdomen, pelvis and lower extremities was performed using the standard protocol during bolus administration of intravenous contrast. Multiplanar CT image reconstructions and MIPs were obtained to evaluate the vascular anatomy. RADIATION DOSE REDUCTION: This exam was performed according to the departmental dose-optimization program which includes automated exposure control, adjustment of the mA and/or kV according to patient size and/or use of iterative reconstruction technique. CONTRAST:  OMNIPAQUE IOHEXOL 350 MG/ML SOLN COMPARISON:  None Available. FINDINGS: VASCULAR Aorta: No aneurysm or dissection. Infrarenal atherosclerotic calcifications. Celiac: Patent without evidence of aneurysm, dissection, vasculitis or significant stenosis. SMA: Patent without evidence of aneurysm, dissection, vasculitis or significant stenosis. Renals: Both renal arteries are patent without evidence of aneurysm, dissection, vasculitis, fibromuscular dysplasia or significant stenosis. IMA: Patent without evidence of aneurysm, dissection, vasculitis or significant stenosis. RIGHT Lower Extremity Inflow: Common, internal and external iliac arteries are patent without evidence of aneurysm, dissection, vasculitis or significant stenosis. Outflow: Common, superficial and profunda femoral arteries and the popliteal artery are patent without evidence of aneurysm, dissection, vasculitis or significant stenosis. Runoff: Proximal trifurcation vessels are patent in the right calf.  The vessels are not visualized beyond the mid calf. It is unclear if this is related to occlusion or bolus timing. LEFT Lower Extremity Inflow: Irregular thrombus extends into the lumen of the left common iliac artery. Left external iliac artery, internal iliac artery widely patent. Outflow: Common, superficial  and profundus femoral arteries are patent. There is a stent noted in the distal superficial femoral artery and popliteal artery. No contrast is seen within the stent or below the stent. Runoff: Non opacification of the trifurcation vessels in the left calf. Veins: No obvious venous abnormality within the limitations of this arterial phase study. Review of the MIP images confirms the above findings. NON-VASCULAR Lower chest: No acute findings Hepatobiliary: No focal hepatic abnormality. Gallbladder unremarkable. Pancreas: No focal abnormality or ductal dilatation. Spleen: No focal abnormality.  Normal size. Adrenals/Urinary Tract: Areas of cortical thinning in the kidneys bilaterally. No renal or adrenal mass. No hydronephrosis. Urinary bladder unremarkable. Stomach/Bowel: Stomach, large and small bowel grossly unremarkable. Lymphatic: No adenopathy Reproductive: Uterus and adnexa unremarkable.  No mass. Other: No free fluid or free air. Musculoskeletal: No acute bony abnormality. IMPRESSION: VASCULAR Aortic atherosclerosis.  No aneurysm or dissection. Irregular plaque in the left common iliac artery extends into the lumen and could be a source of distal emboli. Occlusion of the distal left superficial femoral artery at the level of the stent which extends into the left popliteal artery. No flow seen within the stent. No flow seen within the trifurcation vessels in the left calf. Widely patent outflow and runoff in the right lower extremity to the level of the mid calf. Non visualization of the vessels below the mid calf. This is unclear if this is due to occlusion or bolus timing. NON-VASCULAR No acute  findings in the abdomen or pelvis. Electronically Signed   By: Charlett Nose M.D.   On: 01/05/2022 23:20      Assessment/Plan 1.  Ischemia of the left lower extremity.  Severe and profound with high risk of limb loss.  Patient has had several weeks of ischemia and we will attempt revascularization but her limb may be unsalvageable at this point.  She understands.  Angiogram planned for today. 2.  Anemia.  Fairly severe.  We will need to monitor because there will be significant blood loss with surgery and I expect will need tPA for a chance at limb salvage.  Transfuse as needed.    [name], MD  01/06/2022 1:00 PM    This note was created with Dragon medical transcription system.  Any error is purely unintentional

## 2022-01-06 NOTE — Op Note (Signed)
[facility]  Percutaneous Study/Intervention Procedural Note   Date of Surgery: 01/06/2022  Surgeon(s):Zilpha Mcandrew    Assistants:none  Pre-operative Diagnosis: PAD with rest [name]  Post-operative diagnosis:  Same  Procedure(s) Performed:             1.  Ultrasound guidance for vascular access right femoral artery             2.  Catheter placement into left common femoral artery from right femoral approach             3.  Aortogram and selective left lower extremity angiogram             4.  Catheter directed thrombolytic therapy with 8 mg of tPA in the left SFA and popliteal arteries and instillation of a catheter for continuous thrombolytic therapy in the left SFA, popliteal artery, and tibioperoneal trunk with a 135 cm total length 50 cm working length catheter             5.  Mechanical thrombectomy to the left SFA, popliteal artery, tibioperoneal trunk, proximal posterior tibial artery with the penumbra CAT 6 device  6.  Percutaneous transluminal angioplasty of the left tibioperoneal trunk and most proximal posterior tibial artery with 4 mm diameter by 6 cm length Lutonix drug-coated angioplasty balloon             7.  Right femoral venous triple-lumen catheter placement with ultrasound guidance  EBL: 50 cc  Contrast: 45 cc  Fluoro Time: 7.6 minutes  Moderate Conscious Sedation Time: approximately 51 minutes using 3 mg of Versed and 75 mcg of Fentanyl              Indications:  Patient is a 41 y.o.female with limb threatening ischemia left lower extremity with about 1 month of rest [name] after multiple previous interventions after she stopped her Eliquis. The patient is brought in for angiography for further evaluation and potential treatment.  Due to the limb threatening nature of the situation, angiogram was performed for attempted limb salvage. The patient is aware that if the procedure fails, amputation would be expected.  The patient  also understands that even with successful revascularization, amputation may still be required due to the severity of the situation.  Risks and benefits are discussed and informed consent is obtained.   Procedure:  The patient was identified and appropriate procedural time out was performed.  The patient was then placed supine on the table and prepped and draped in the usual sterile fashion. Moderate conscious sedation was administered during a face to face encounter with the patient throughout the procedure with my supervision of the RN administering medicines and monitoring the patient's vital signs, pulse oximetry, telemetry and mental status throughout from the start of the procedure until the patient was taken to the recovery room. Ultrasound was used to evaluate the right common femoral artery.  It was patent .  A digital ultrasound image was acquired.  A Seldinger needle was used to access the right common femoral artery under direct ultrasound guidance and a permanent image was performed.  A 0.035 J wire was advanced without resistance and a 5Fr sheath was placed.  Pigtail catheter was placed into the aorta and an AP aortogram was performed. This demonstrated normal renal arteries and normal aorta and iliac segments without significant stenosis. I then crossed the aortic bifurcation and advanced to the left femoral head. Selective left lower extremity angiogram was then performed.  This demonstrated normal common femoral artery and profunda femoris artery.  The SFA was very small and occluded in the midsegment just above the previously placed stent.  There is essentially no runoff distally seen.. It was felt that it was in the patient's best interest to proceed with intervention after these images to avoid a second procedure and a larger amount of contrast and fluoroscopy based off of the findings from the initial angiogram. The patient was systemically heparinized and a 6 [place] destination sheath was then  placed over the Terumo Advantage wire. I then used a Kumpe catheter and the advantage wire to easily cross the occlusion and get down into the tibioperoneal trunk where imaging still showed essentially no flow distally.  We then exchanged for a V18 wire after instilling 8 mg of tPA with a Kumpe catheter and the left SFA and popliteal arteries.  Several passes were made with the penumbra CAT 6 device in the left SFA, popliteal artery, tibioperoneal trunk, and the most proximal portion of the posterior tibial artery with minimal improvement in flow.  There was a difficult distal cap in the tibioperoneal trunk that we were having a hard time crossing that I performed angioplasty with a 4 mm diameter by 6 cm length Lutonix drug-coated angioplasty balloon inflated to 8 atm for 1 minute.  Flow still remained minimal.  Our only hope for limb salvage would be thrombolytic therapy in hopes of clearing tibial vessels for runoff.  I elected to terminate the procedure.  A 135 cm total length 50 cm working length thrombolytic catheter was placed starting in the proximal SFA and running down through the popliteal artery, and in the tibioperoneal trunk and potentially the most proximal portion of the posterior tibial artery.  This was secured into place with a silk suture as was the sheath.  I then placed a central venous catheter to allow durable venous access while he was getting thrombolytic therapy.  Ultrasound was used to visualize a patent right femoral vein and then was accessed under direct ultrasound guidance without difficulty with a Seldinger needle.  A J-wire was placed.  After the dilator was placed over the wire and removed, the triple-lumen catheter was placed over the wire and the wire was removed.  It was secured in place with 2 silk sutures and all 3 lm withdrew dark red nonpulsatile blood and were flushed with heparinized saline.  The patient was taken to the recovery room in stable condition having tolerated  the procedure well.  Findings:               Aortogram:  This demonstrated normal renal arteries and normal aorta and iliac segments without significant stenosis.             Left Lower Extremity: Normal common femoral artery and profunda femoris artery.  The SFA was very small and occluded in the midsegment just above the previously placed stent.  There is essentially no runoff distally seen.   Disposition: Patient was taken to the recovery room in stable condition having tolerated the procedure well.  Complications: None  Jocelyn [name] 01/06/2022 3:08 PM   This note was created with Dragon Medical transcription system. Any errors in dictation are purely unintentional.

## 2022-01-06 NOTE — Progress Notes (Signed)
Pt transferred to cath lab, vs wnl, no distress

## 2022-01-06 NOTE — H&P (View-Only) (Signed)
[facility] Vascular Consult Note  MRN : [id number]  Jocelyn [name] is a 41 y.o. (07/22/1980) female who presents with chief complaint of  Chief Complaint  Patient presents with   Leg Pain    Pt with a hx of DVT's presents to the ED for evaluation of left leg pain.  Marland Kitchen  History of Present Illness: Patient presents to the emergency department last night and we are asked to evaluate the patient for ischemia of the left lower extremity.  The patient has a known severe history of arterial insufficiency with multiple previous interventions, the last of which was in 2021.  Despite this, she continues to smoke and she lost her insurance and had to stop taking Eliquis a month ago.  Shortly after stopping the Eliquis, her left foot and leg began to hurt.  This has now been hurting for nearly a month.  She presented to the emergency department last night with an insensate painful left foot.  The leg began swelling within the past few days.  No right leg symptoms.  The emergency department ordered a CT angiogram which confirmed the suspicion of an occluded left SFA and popliteal stent with poor runoff distally.  Current Facility-Administered Medications  Medication Dose Route Frequency Provider Last Rate Last Admin   [MAR Hold] 0.9 %  sodium chloride infusion (Manually program via Guardrails IV Fluids)   Intravenous Once Lucrezia Starch, MD       0.9 %  sodium chloride infusion   Intravenous Continuous Ailani Governale, Erskine Squibb, MD       ceFAZolin (ANCEF) IVPB 2g/100 mL premix  2 g Intravenous 30 min Pre-Op Murlin Schrieber, Erskine Squibb, MD       [facility] Hold] docusate sodium (COLACE) capsule 100 mg  100 mg Oral BID PRN Lang Snow, NP       heparin ADULT infusion 100 units/mL (25000 units/261mL)  1,250 Units/hr Intravenous Continuous Renda Rolls, RPH 12.5 mL/hr at 01/06/22 0027 1,250 Units/hr at 01/06/22 0027   [MAR Hold] HYDROmorphone (DILAUDID) injection 1 mg  1 mg Intravenous Q1H PRN Flora Lipps, MD   1 mg at 01/06/22 1037   [MAR Hold] insulin aspart (novoLOG) injection 0-15 Units  0-15 Units Subcutaneous Q4H Lucrezia Starch, MD   2 Units at 01/06/22 0908   [MAR Hold] ondansetron (ZOFRAN) injection 4 mg  4 mg Intravenous Q6H PRN Dallie Piles, RPH   4 mg at 01/06/22 0830   Or   [MAR Hold] ondansetron (ZOFRAN) 8 mg in sodium chloride 0.9 % 50 mL IVPB  8 mg Intravenous Q6H PRN Dallie Piles, [facility]       [MAR Hold] pantoprazole (PROTONIX) injection 40 mg  40 mg Intravenous Q12H Florina Ou V, MD   40 mg at 01/06/22 0202   [MAR Hold] polyethylene glycol (MIRALAX / GLYCOLAX) packet 17 g  17 g Oral Daily PRN Lang Snow, NP        Past Medical History:  Diagnosis Date   IDA (iron deficiency anemia) 12/17/2019    Past Surgical History:  Procedure Laterality Date   APPENDECTOMY     LOWER EXTREMITY ANGIOGRAPHY Left 01/11/2020   Procedure: Lower Extremity Angiography;  Surgeon: Algernon Huxley, MD;  Location: [place] [facility];  Service: Cardiovascular;  Laterality: Left;   LOWER EXTREMITY ANGIOGRAPHY Left 03/06/2020   Procedure: Lower Extremity Angiography;  Surgeon: Algernon Huxley, MD;  Location: [place] [facility];  Service: Cardiovascular;  Laterality:  Left;   LOWER EXTREMITY ANGIOGRAPHY Left 03/07/2020   Procedure: Lower Extremity Angiography;  Surgeon: Algernon Huxley, MD;  Location: [place] [facility];  Service: Cardiovascular;  Laterality: Left;     Social History   Tobacco Use   Smoking status: Every Day    Packs/day: 0.50    Types: Cigarettes   Smokeless tobacco: Never  Vaping Use   Vaping Use: Never used  Substance Use Topics   Alcohol use: Yes   Drug use: Never     Family History  Problem Relation Age of Onset   Hypertension Mother    Hypertension Sister    Hypertension Brother     No Known Allergies   REVIEW OF SYSTEMS (Negative unless checked)  Constitutional: [] Weight loss  [] Fever  [] Chills Cardiac: [] Chest pain   [] Chest pressure    [] Palpitations   [] Shortness of breath when laying flat   [] Shortness of breath at rest   [] Shortness of breath with exertion. Vascular:  [] Pain in legs with walking   [] Pain in legs at rest   [] Pain in legs when laying flat   [] Claudication   [] Pain in feet when walking  [x] Pain in feet at rest  [x] Pain in feet when laying flat   [] History of DVT   [] Phlebitis   [] Swelling in legs   [] Varicose veins   [] Non-healing ulcers Pulmonary:   [] Uses home oxygen   [] Productive cough   [] Hemoptysis   [] Wheeze  [] COPD   [] Asthma Neurologic:  [] Dizziness  [] Blackouts   [] Seizures   [] History of stroke   [] History of TIA  [] Aphasia   [] Temporary blindness   [] Dysphagia   [] Weakness or numbness in arms   [x] Weakness or numbness in legs Musculoskeletal:  [] Arthritis   [] Joint swelling   [] Joint pain   [] Low back pain Hematologic:  [] Easy bruising  [] Easy bleeding   [] Hypercoagulable state   [x] Anemic  [] Hepatitis Gastrointestinal:  [] Blood in stool   [] Vomiting blood  [] Gastroesophageal reflux/heartburn   [] Difficulty swallowing. Genitourinary:  [] Chronic kidney disease   [] Difficult urination  [] Frequent urination  [] Burning with urination   [] Blood in urine Skin:  [] Rashes   [] Ulcers   [] Wounds Psychological:  [] History of anxiety   []  History of major depression.  Physical Examination  Vitals:   01/06/22 0945 01/06/22 1000 01/06/22 1031 01/06/22 1100  BP: 135/62 138/72 123/68 114/74  Pulse: 64 (!) 110 (!) 120   Resp: 20 12 18    Temp:   99 F (37.2 C) 99.1 F (37.3 C)  TempSrc:   Oral Oral  SpO2: 100% 99% 98% 97%  Weight:      Height:       Body mass index is 31.45 kg/m. Gen:  WD/WN, NAD Head: [place]/AT, No temporalis wasting.  Ear/Nose/Throat: Hearing grossly intact, nares w/o erythema or drainage, oropharynx w/o Erythema/Exudate Eyes: Sclera non-icteric, conjunctiva clear Neck: Trachea midline.  No JVD.  Pulmonary:  Good air movement, respirations not labored, equal bilaterally.  Cardiac: RRR,  normal S1, S2. Vascular:  Vessel Right Left  Radial Palpable Palpable                          PT Palpable Not Palpable  DP Palpable Not Palpable   Gastrointestinal: soft, non-tender/non-distended. No guarding/reflex.  Musculoskeletal: M/S 5/5 throughout.  2+ LLE edema. Left foot with diminished sensation and cool with sluggish capillary refill. Neurologic: Sensation diminished in LLE.  Symmetrical.  Speech is fluent. Motor exam as  listed above. Psychiatric: Judgment intact, Mood & affect appropriate for pt's clinical situation. Dermatologic: No rashes or ulcers noted.  No cellulitis or open wounds.      CBC Lab Results  Component Value Date   WBC 15.3 (H) 01/06/2022   HGB 6.4 (L) 01/06/2022   HCT 24.8 (L) 01/06/2022   MCV 66.3 (L) 01/06/2022   PLT 349 01/06/2022    BMET    Component Value Date/Time   NA 131 (L) 01/06/2022 0537   NA 134 (L) 04/24/2013 1557   K 3.9 01/06/2022 0537   K 4.4 04/24/2013 1557   CL 98 01/06/2022 0537   CL 105 04/24/2013 1557   CO2 22 01/06/2022 0537   CO2 24 04/24/2013 1557   GLUCOSE 112 (H) 01/06/2022 0537   GLUCOSE 103 (H) 04/24/2013 1557   BUN 13 01/06/2022 0537   BUN 7 04/24/2013 1557   CREATININE 1.02 (H) 01/06/2022 0537   CREATININE 0.51 (L) 04/24/2013 1557   CALCIUM 8.8 (L) 01/06/2022 0537   CALCIUM 9.1 04/24/2013 1557   GFRNONAA >60 01/06/2022 0537   GFRNONAA >60 04/24/2013 1557   GFRAA >60 03/09/2020 0438   GFRAA >60 04/24/2013 1557   Estimated Creatinine Clearance: 77.7 mL/min (A) (by C-G formula based on SCr of 1.02 mg/dL (H)).  COAG Lab Results  Component Value Date   INR 1.2 01/05/2022   INR 1.0 03/06/2020   INR 1.0 01/10/2020    Radiology DG Chest Portable 1 View  Result Date: 01/05/2022 CLINICAL DATA:  Tachycardia. EXAM: PORTABLE CHEST 1 VIEW COMPARISON:  03/07/2020, lung bases from abdominal CT earlier today. FINDINGS: The cardiomediastinal contours are normal. The lungs are clear. Pulmonary vasculature  is normal. No consolidation, pleural effusion, or pneumothorax. No acute osseous abnormalities are seen. IMPRESSION: No acute chest findings. Electronically Signed   By: Narda Rutherford M.D.   On: 01/05/2022 23:48   CT Angio Aortobifemoral W and/or Wo Contrast  Result Date: 01/05/2022 CLINICAL DATA:  Arterial embolism, lower extremity. LEFT LOWER LEG PAIN EXAM: CT ANGIOGRAPHY OF ABDOMINAL AORTA WITH ILIOFEMORAL RUNOFF TECHNIQUE: Multidetector CT imaging of the abdomen, pelvis and lower extremities was performed using the standard protocol during bolus administration of intravenous contrast. Multiplanar CT image reconstructions and MIPs were obtained to evaluate the vascular anatomy. RADIATION DOSE REDUCTION: This exam was performed according to the departmental dose-optimization program which includes automated exposure control, adjustment of the mA and/or kV according to patient size and/or use of iterative reconstruction technique. CONTRAST:  OMNIPAQUE IOHEXOL 350 MG/ML SOLN COMPARISON:  None Available. FINDINGS: VASCULAR Aorta: No aneurysm or dissection. Infrarenal atherosclerotic calcifications. Celiac: Patent without evidence of aneurysm, dissection, vasculitis or significant stenosis. SMA: Patent without evidence of aneurysm, dissection, vasculitis or significant stenosis. Renals: Both renal arteries are patent without evidence of aneurysm, dissection, vasculitis, fibromuscular dysplasia or significant stenosis. IMA: Patent without evidence of aneurysm, dissection, vasculitis or significant stenosis. RIGHT Lower Extremity Inflow: Common, internal and external iliac arteries are patent without evidence of aneurysm, dissection, vasculitis or significant stenosis. Outflow: Common, superficial and profunda femoral arteries and the popliteal artery are patent without evidence of aneurysm, dissection, vasculitis or significant stenosis. Runoff: Proximal trifurcation vessels are patent in the right calf.  The vessels are not visualized beyond the mid calf. It is unclear if this is related to occlusion or bolus timing. LEFT Lower Extremity Inflow: Irregular thrombus extends into the lumen of the left common iliac artery. Left external iliac artery, internal iliac artery widely patent. Outflow: Common, superficial  and profundus femoral arteries are patent. There is a stent noted in the distal superficial femoral artery and popliteal artery. No contrast is seen within the stent or below the stent. Runoff: Non opacification of the trifurcation vessels in the left calf. Veins: No obvious venous abnormality within the limitations of this arterial phase study. Review of the MIP images confirms the above findings. NON-VASCULAR Lower chest: No acute findings Hepatobiliary: No focal hepatic abnormality. Gallbladder unremarkable. Pancreas: No focal abnormality or ductal dilatation. Spleen: No focal abnormality.  Normal size. Adrenals/Urinary Tract: Areas of cortical thinning in the kidneys bilaterally. No renal or adrenal mass. No hydronephrosis. Urinary bladder unremarkable. Stomach/Bowel: Stomach, large and small bowel grossly unremarkable. Lymphatic: No adenopathy Reproductive: Uterus and adnexa unremarkable.  No mass. Other: No free fluid or free air. Musculoskeletal: No acute bony abnormality. IMPRESSION: VASCULAR Aortic atherosclerosis.  No aneurysm or dissection. Irregular plaque in the left common iliac artery extends into the lumen and could be a source of distal emboli. Occlusion of the distal left superficial femoral artery at the level of the stent which extends into the left popliteal artery. No flow seen within the stent. No flow seen within the trifurcation vessels in the left calf. Widely patent outflow and runoff in the right lower extremity to the level of the mid calf. Non visualization of the vessels below the mid calf. This is unclear if this is due to occlusion or bolus timing. NON-VASCULAR No acute  findings in the abdomen or pelvis. Electronically Signed   By: Charlett Nose M.D.   On: 01/05/2022 23:20      Assessment/Plan 1.  Ischemia of the left lower extremity.  Severe and profound with high risk of limb loss.  Patient has had several weeks of ischemia and we will attempt revascularization but her limb may be unsalvageable at this point.  She understands.  Angiogram planned for today. 2.  Anemia.  Fairly severe.  We will need to monitor because there will be significant blood loss with surgery and I expect will need tPA for a chance at limb salvage.  Transfuse as needed.    [name], MD  01/06/2022 1:00 PM    This note was created with Dragon medical transcription system.  Any error is purely unintentional

## 2022-01-06 NOTE — ED Notes (Signed)
Informed RN bed assigned 

## 2022-01-07 ENCOUNTER — Encounter: Payer: Self-pay | Admitting: Vascular Surgery

## 2022-01-07 ENCOUNTER — Encounter
Admission: EM | Disposition: A | Discharge status: Home / Self Care | Payer: Self-pay | Source: Home / Self Care | Attending: Internal Medicine

## 2022-01-07 HISTORY — PX: LOWER EXTREMITY ANGIOGRAPHY: CATH118251

## 2022-01-07 LAB — BASIC METABOLIC PANEL
Anion gap: 11 (ref 5–15)
BUN: 18 mg/dL (ref 6–20)
CO2: 20 mmol/L — ABNORMAL LOW (ref 22–32)
Calcium: 8.5 mg/dL — ABNORMAL LOW (ref 8.9–10.3)
Chloride: 100 mmol/L (ref 98–111)
Creatinine, Ser: 1.17 mg/dL — ABNORMAL HIGH (ref 0.44–1.00)
GFR, Estimated: 60 mL/min — ABNORMAL LOW (ref >60)
Glucose, Bld: 148 mg/dL — ABNORMAL HIGH (ref 70–99)
Potassium: 4.2 mmol/L (ref 3.5–5.1)
Sodium: 131 mmol/L — ABNORMAL LOW (ref 135–145)

## 2022-01-07 LAB — CBC WITH DIFFERENTIAL/PLATELET
Abs Immature Granulocytes: 0.23 10*3/uL — ABNORMAL HIGH (ref 0.00–0.07)
Basophils Absolute: 0.1 10*3/uL (ref 0.0–0.1)
Basophils Relative: 0 %
Eosinophils Absolute: 0 10*3/uL (ref 0.0–0.5)
Eosinophils Relative: 0 %
HCT: 28.1 % — ABNORMAL LOW (ref 36.0–46.0)
Hemoglobin: 7.9 g/dL — ABNORMAL LOW (ref 12.0–15.0)
Immature Granulocytes: 1 %
Lymphocytes Relative: 5 %
Lymphs Abs: 1.2 10*3/uL (ref 0.7–4.0)
MCH: 19.1 pg — ABNORMAL LOW (ref 26.0–34.0)
MCHC: 28.1 g/dL — ABNORMAL LOW (ref 30.0–36.0)
MCV: 67.9 fL — ABNORMAL LOW (ref 80.0–100.0)
Monocytes Absolute: 2.2 10*3/uL — ABNORMAL HIGH (ref 0.1–1.0)
Monocytes Relative: 10 %
Neutro Abs: 18.6 10*3/uL — ABNORMAL HIGH (ref 1.7–7.7)
Neutrophils Relative %: 84 %
Platelets: 304 10*3/uL (ref 150–400)
RBC: 4.14 MIL/uL (ref 3.87–5.11)
RDW: 26.6 % — ABNORMAL HIGH (ref 11.5–15.5)
Smear Review: NORMAL
WBC: 22.4 10*3/uL — ABNORMAL HIGH (ref 4.0–10.5)
nRBC: 0.8 % — ABNORMAL HIGH (ref 0.0–0.2)

## 2022-01-07 LAB — MAGNESIUM: Magnesium: 2.2 mg/dL (ref 1.7–2.4)

## 2022-01-07 LAB — HEMOGLOBIN A1C
Hgb A1c MFr Bld: 5.1 % (ref 4.8–5.6)
Mean Plasma Glucose: 100 mg/dL

## 2022-01-07 LAB — GLUCOSE, CAPILLARY
Glucose-Capillary: 171 mg/dL — ABNORMAL HIGH (ref 70–99)
Glucose-Capillary: 174 mg/dL — ABNORMAL HIGH (ref 70–99)
Glucose-Capillary: 158 mg/dL — ABNORMAL HIGH (ref 70–99)
Glucose-Capillary: 169 mg/dL — ABNORMAL HIGH (ref 70–99)
Glucose-Capillary: 212 mg/dL — ABNORMAL HIGH (ref 70–99)
Glucose-Capillary: 186 mg/dL — ABNORMAL HIGH (ref 70–99)

## 2022-01-07 LAB — HEMOGLOBIN AND HEMATOCRIT, BLOOD
HCT: 29.1 % — ABNORMAL LOW (ref 36.0–46.0)
Hemoglobin: 8.1 g/dL — ABNORMAL LOW (ref 12.0–15.0)

## 2022-01-07 LAB — HIGH SENSITIVITY CRP: CRP, High Sensitivity: 147.69 mg/L — ABNORMAL HIGH (ref 0.00–3.00)

## 2022-01-07 LAB — PROCALCITONIN: Procalcitonin: 3.91 ng/mL

## 2022-01-07 SURGERY — LOWER EXTREMITY ANGIOGRAPHY
Anesthesia: Moderate Sedation | Laterality: Left

## 2022-01-07 MED ORDER — IODIXANOL 320 MG/ML IV SOLN
INTRAVENOUS | Status: DC | PRN
  Administered 2022-01-07: 65 mL

## 2022-01-07 MED ORDER — HEPARIN SODIUM (PORCINE) 1000 UNIT/ML IJ SOLN
INTRAMUSCULAR | Status: DC | PRN
  Administered 2022-01-07: 3000 [IU] via INTRAVENOUS

## 2022-01-07 MED ORDER — ACETAMINOPHEN 325 MG PO TABS: 650.0000 mg | ORAL_TABLET | ORAL | Status: DC | PRN

## 2022-01-07 MED ORDER — PROCHLORPERAZINE EDISYLATE 10 MG/2ML IJ SOLN
10.0000 mg | Freq: Once | INTRAMUSCULAR | Status: AC
Start: 2022-01-08 — End: 2022-01-07
  Administered 2022-01-07: 10 mg via INTRAVENOUS
  Filled 2022-01-07: qty 2

## 2022-01-07 MED ORDER — CEFAZOLIN SODIUM-DEXTROSE 2-4 GM/100ML-% IV SOLN: 2.0000 g | INTRAVENOUS | Status: AC

## 2022-01-07 MED ORDER — ACETAMINOPHEN 325 MG PO TABS
ORAL_TABLET | ORAL | Status: AC
  Filled 2022-01-07: qty 2

## 2022-01-07 MED ORDER — NITROGLYCERIN 1 MG/10 ML FOR IR/CATH LAB
INTRA_ARTERIAL | Status: DC | PRN
  Administered 2022-01-07: 300 ug via INTRA_ARTERIAL

## 2022-01-07 MED ORDER — CEFAZOLIN SODIUM-DEXTROSE 2-4 GM/100ML-% IV SOLN
INTRAVENOUS | Status: AC
  Administered 2022-01-07: 2 g via INTRAVENOUS
  Filled 2022-01-07: qty 100

## 2022-01-07 MED ORDER — FENTANYL CITRATE (PF) 100 MCG/2ML IJ SOLN
INTRAMUSCULAR | Status: DC | PRN
  Administered 2022-01-07: 25 ug via INTRAVENOUS
  Administered 2022-01-07: 50 ug via INTRAVENOUS

## 2022-01-07 MED ORDER — HEPARIN SODIUM (PORCINE) 1000 UNIT/ML IJ SOLN
INTRAMUSCULAR | Status: AC
  Filled 2022-01-07: qty 10

## 2022-01-07 MED ORDER — HYDROMORPHONE HCL 1 MG/ML IJ SOLN
1.0000 mg | Freq: Once | INTRAMUSCULAR | Status: AC | PRN
  Administered 2022-01-07: 1 mg via INTRAVENOUS
  Filled 2022-01-07: qty 1

## 2022-01-07 MED ORDER — ACETAMINOPHEN 325 MG PO TABS
650.0000 mg | ORAL_TABLET | Freq: Four times a day (QID) | ORAL | Status: DC | PRN
  Administered 2022-01-07: 650 mg via ORAL

## 2022-01-07 MED ORDER — TIROFIBAN HCL IN NACL 5-0.9 MG/100ML-% IV SOLN: 0.1500 ug/kg/min | INTRAVENOUS | Status: DC

## 2022-01-07 MED ORDER — SODIUM CHLORIDE 0.9 % IV SOLN: INTRAVENOUS | Status: DC

## 2022-01-07 MED ORDER — DIPHENHYDRAMINE HCL 50 MG/ML IJ SOLN: 50.0000 mg | Freq: Once | INTRAMUSCULAR | Status: DC | PRN

## 2022-01-07 MED ORDER — ONDANSETRON HCL 4 MG/2ML IJ SOLN
4.0000 mg | Freq: Four times a day (QID) | INTRAMUSCULAR | Status: DC | PRN
Start: 2022-01-07 — End: 2022-01-10
  Administered 2022-01-07: 4 mg via INTRAVENOUS

## 2022-01-07 MED ORDER — TIROFIBAN HCL IN NACL 5-0.9 MG/100ML-% IV SOLN
0.1500 ug/kg/min | INTRAVENOUS | Status: DC
  Administered 2022-01-08: 0.15 ug/kg/min via INTRAVENOUS
  Filled 2022-01-07 (×4): qty 100

## 2022-01-07 MED ORDER — MIDAZOLAM HCL 2 MG/ML PO SYRP
8.0000 mg | ORAL_SOLUTION | Freq: Once | ORAL | Status: DC | PRN
Start: 2022-01-07 — End: 2022-01-07

## 2022-01-07 MED ORDER — TIROFIBAN (AGGRASTAT) BOLUS VIA INFUSION
25.0000 ug/kg | Freq: Once | INTRAVENOUS | Status: AC
  Administered 2022-01-07: 2142.5 ug via INTRAVENOUS

## 2022-01-07 MED ORDER — OXYCODONE HCL 5 MG PO TABS
5.0000 mg | ORAL_TABLET | Freq: Four times a day (QID) | ORAL | Status: DC | PRN
  Administered 2022-01-07 – 2022-01-09 (×4): 5 mg via ORAL
  Filled 2022-01-07 (×4): qty 1

## 2022-01-07 MED ORDER — TIROFIBAN (AGGRASTAT) BOLUS VIA INFUSION
25.0000 ug/kg | Freq: Once | INTRAVENOUS | Status: DC
  Filled 2022-01-07: qty 43

## 2022-01-07 MED ORDER — TIROFIBAN HCL IV 12.5 MG/250 ML
INTRAVENOUS | Status: AC
  Filled 2022-01-07: qty 250

## 2022-01-07 MED ORDER — MIDAZOLAM HCL 2 MG/2ML IJ SOLN
INTRAMUSCULAR | Status: AC
  Filled 2022-01-07: qty 2

## 2022-01-07 MED ORDER — MIDAZOLAM HCL 2 MG/2ML IJ SOLN
INTRAMUSCULAR | Status: DC | PRN
  Administered 2022-01-07: .5 mg via INTRAVENOUS
  Administered 2022-01-07: 1 mg via INTRAVENOUS

## 2022-01-07 MED ORDER — NITROGLYCERIN 1 MG/10 ML FOR IR/CATH LAB
INTRA_ARTERIAL | Status: AC
  Filled 2022-01-07: qty 10

## 2022-01-07 MED ORDER — FAMOTIDINE 20 MG PO TABS
40.0000 mg | ORAL_TABLET | Freq: Once | ORAL | Status: DC | PRN
Start: 2022-01-07 — End: 2022-01-07

## 2022-01-07 MED ORDER — FENTANYL CITRATE (PF) 100 MCG/2ML IJ SOLN
INTRAMUSCULAR | Status: AC
  Filled 2022-01-07: qty 2

## 2022-01-07 MED ORDER — METHYLPREDNISOLONE SODIUM SUCC 125 MG IJ SOLR: 125.0000 mg | Freq: Once | INTRAMUSCULAR | Status: DC | PRN

## 2022-01-07 SURGICAL SUPPLY — 14 items
BALLN LUTONIX 018 4X100X130 (BALLOONS) ×2
BALLN LUTONIX 018 5X300X130 (BALLOONS) ×2
BALLN ULTRVRSE 2.5X300X150 (BALLOONS) ×2
BALLOON LUTONIX 018 4X100X130 (BALLOONS) IMPLANT
BALLOON LUTONIX 018 5X300X130 (BALLOONS) IMPLANT
BALLOON ULTRVRSE 2.5X300X150 (BALLOONS) IMPLANT
CANISTER PENUMBRA ENGINE (MISCELLANEOUS) ×1 IMPLANT
CATH INDIGO CAT6 KIT (CATHETERS) ×1 IMPLANT
COVER DRAPE FLUORO 36X44 (DRAPES) ×1 IMPLANT
DEVICE STARCLOSE SE CLOSURE (Vascular Products) ×1 IMPLANT
KIT ENCORE 26 ADVANTAGE (KITS) ×1 IMPLANT
PACK ANGIOGRAPHY (CUSTOM PROCEDURE TRAY) ×2 IMPLANT
WIRE G V18X300CM (WIRE) ×1 IMPLANT
WIRE GUIDERIGHT .035X150 (WIRE) ×1 IMPLANT

## 2022-01-07 NOTE — Progress Notes (Signed)
PHARMACY CONSULT NOTE  Pharmacy Consult for Electrolyte Monitoring and Replacement   Recent Labs: Potassium (mmol/L)  Date Value  01/07/2022 4.2  04/24/2013 4.4   Magnesium (mg/dL)  Date Value  17/51/0258 2.2   Calcium (mg/dL)  Date Value  52/77/8242 8.5 (L)   Calcium, Total (mg/dL)  Date Value  35/36/1443 9.1   Albumin (g/dL)  Date Value  15/40/0867 3.6  04/24/2013 4.1   Phosphorus (mg/dL)  Date Value  61/95/0932 4.1   Sodium (mmol/L)  Date Value  01/07/2022 131 (L)  04/24/2013 134 (L)   Assessment: 41 yo female w/ "hx of clots and stent placement in the lower extremities brought by EMS for left lower leg pain." CTA shows irregular clot extending lumen of artery and ischemia of left leg and foot with no circulation now on heparin drip admitted to CCU. Pharmacy is asked to follow and replace electrolytes while in   Goal of Therapy:  Electrolytes within normal limits  Plan:  --Na 131, defer management to PCCM --No electrolyte replacement indicated at this time --Follow-up electrolytes with AM labs tomorrow  Tressie Ellis 01/07/2022 8:03 AM

## 2022-01-07 NOTE — Progress Notes (Signed)
NP made aware of pts HR when pt got back from procedure, still  ST 130's, bp is WNL, gave prn dilaudid, no new orders at this time, will continue to monitor.

## 2022-01-07 NOTE — Interval H&P Note (Signed)
History and Physical Interval Note:  01/07/2022 9:58 AM  Jocelyn [name]  has presented today for surgery, with the diagnosis of PAD.  The various methods of treatment have been discussed with the patient and family. After consideration of risks, benefits and other options for treatment, the patient has consented to  Procedure(s): Lower Extremity Angiography (Left) as a surgical intervention.  The patient's history has been reviewed, patient examined, no change in status, stable for surgery.  I have reviewed the patient's chart and labs.  Questions were answered to the patient's satisfaction.     [name]

## 2022-01-07 NOTE — Progress Notes (Signed)
Made MD aware of pt. 100.2 Temp. Orally, ST 130's, and WBC today 22. No new orders.

## 2022-01-07 NOTE — Progress Notes (Signed)
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   ED Course: In the emergency department, the temperature was 36.9C, the heart rate  170 beats/minute, the blood pressure 175/105 mm Hg, the respiratory rate 24 breaths/minute, and the oxygen saturation 100% on RA.  Pertinent Labs /Diagnostics Findings: Na+/ K+: 133/3.6, Glucose: 223, BUN/Cr.:12/1.16 ,CO2:17, Anion Gap:17, WBC/ TMAX: 21.5/ afebrile, Hgb/Hct: 5.6/23.2, PCT: 0.35, Lactic acid:1.9, CTA abdomen showed occlusion of the distal left superficial femoral artery at the level of the stent which extends into the popliteal artery with occluded stent otherwise no other acute findings in the abdomen and pelvis.   The laboratory data showed an anion gap, metabolic acidosis, and hyperglycemia consistent w/mild DKA.  Patient also with elevated white count and procal which was  concerning for possible underlying infection and hemoglobin of 5.6 with no obvious overt bleeding.  The management was initiated with IV heparin per vascular on-call surgeon's recommendations with the plan for possible intervention of ischemic LLE in the a.m.  Patient was also started on broad-spectrum antibiotics and will be transfused with 2 units of PRBC in addition to IV fluids.  PCCM consulted for admission.   Past Medical History  DM HTN PAD w/rest s/p LLE Angiogram with stent Iron Deficiency Anemia   Significant Hospital Events   6/27: Admit to  stepdown with Acute on Chronic Ischemic LLE   Consults:  Vascular   Procedures:  None   Significant Diagnostic Tests:  6/27: Chest Xray>No acute cardiopulmonary process 6/27: CTA abdomen and pelvis>Irregular plaque in the left common iliac artery extends into the lumen and could be a source of distal emboli. Occlusion of the distal left superficial femoral artery at the level of the stent which extends into the left popliteal artery. No flow seen within the stent. No flow seen within the trifurcation vessels in the left calf. 6/28 status post thrombectomy left lower extremity tPA infusion appreciate vascular surgery assessment   Micro Data:  6/27: SARS-CoV-2 PCR> negative 6/27: Blood culture x2> 6/27: Urine Culture> 6/28: MRSA PCR>>    Antimicrobials:  Vancomycin Cefepime Azithromycin Ceftriaxone Metronidazole       Interim History / Subjective:  Remains on tPA Plan for revascularization today Still in pain Alert and awake following commands      Objective   Blood pressure (!) 150/103, pulse (!) 129, temperature 98.8 F (37.1 C), temperature source Oral, resp. rate (!) 21, height 5\' 5"  (1.651 m), weight 85.7 kg, last menstrual period 01/05/2022, SpO2 95 %.        Intake/Output Summary (Last 24 hours) at 01/07/2022 0731 Last data filed at 01/06/2022 1843 Gross per 24 hour  Intake 938.24 ml  Output --  Net 938.24 ml   Filed Weights   01/05/22 2222 01/06/22 1245  Weight: 85.7 kg 85.7 kg       Review of Systems: Gen:  Denies  fever, sweats, chills weight loss  HEENT: Denies blurred vision, double vision, ear pain, eye pain, hearing loss, nose bleeds, sore throat Cardiac:  No dizziness, chest pain or heaviness, chest tightness,edema, No JVD Resp:   No cough, -sputum production, -shortness of breath,-wheezing, -hemoptysis,  +pain Other:  All other systems negative   Physical Examination:   General Appearance: +distress +left foot pain EYES PERRLA, EOM  intact.   NECK Supple, No JVD Pulmonary: normal breath sounds, No wheezing.  CardiovascularNormal S1,S2.  No m/r/g.   Abdomen: Benign, Soft, non-tender. Skin:   warm, no rashes, no ecchymosis  Extremities:LEFT FOOT COLD Neuro:without focal findings,  speech normal  PSYCHIATRIC: Mood, affect within normal limits.   Labs/imaging that I havepersonally reviewed  (right click and "Reselect all SmartList Selections" daily)    ASSESSMENT AND PLAN SYNOPSIS  Admitted for critical limb and ACUTE LIMB ISCHEMIA,  SEVERE ABLA, DKA  PROGNOSIS IS GUARDED   Follow-up vascular surgery recommendations Back to the vascular lab for revascularization Pain control    CARDIAC ICU monitoring   RENAL -continue Foley Catheter-assess need -Avoid nephrotoxic agents -Follow urine output, BMP -Ensure adequate renal perfusion, optimize oxygenation -Renal dose medications   Intake/Output Summary (Last 24 hours) at 01/07/2022 0731 Last data filed at 01/06/2022 1843 Gross per 24 hour  Intake 938.24 ml  Output --  Net 938.24 ml     ENDO - ICU hypoglycemic\Hyperglycemia protocol -check FSBS per protocol   GI GI PROPHYLAXIS as indicated  NUTRITIONAL STATUS DIET-->NPO Constipation protocol as indicated   ELECTROLYTES -follow labs as needed -replace as needed -pharmacy consultation and following    insulin aspart  0-15 Units Subcutaneous Q4H   pantoprazole (PROTONIX) IV  40 mg Intravenous Q12H     Best practice (right click and "Reselect all SmartList Selections" daily)  Diet:  NPO DVT prophylaxis: Systemic AC Mobility:  bed rest  Code Status:  FULL CODE Disposition: ICU  Labs   CBC: Recent Labs  Lab 01/05/22 2234 01/06/22 0537 01/06/22 1747 01/06/22 2357  WBC 21.5* 15.3*  --   --   NEUTROABS 18.0*  --   --   --   HGB 5.6* 6.4* 8.0* 8.1*  HCT 23.2* 24.8* 28.3* 29.1*  MCV 62.5* 66.3*  --   --   PLT 404* 349  --   --     Basic Metabolic Panel: Recent Labs  Lab  01/05/22 2234 01/06/22 0537  NA 133* 131*  K 3.6 3.9  CL 99 98  CO2 17* 22  GLUCOSE 223* 112*  BUN 12 13  CREATININE 1.16* 1.02*  CALCIUM 9.0 8.8*  MG 1.6* 2.5*  PHOS  --  4.1   GFR: Estimated Creatinine Clearance: 77.7 mL/min (A) (by C-G formula based on SCr of 1.02 mg/dL (H)). Recent Labs  Lab 01/05/22 2234 01/05/22 2354 01/06/22 0537 01/06/22 0620  PROCALCITON 0.35  --  4.04  --   WBC 21.5*  --  15.3*  --   LATICACIDVEN  --  1.9  --  1.2    Liver Function Tests: Recent Labs  Lab 01/05/22 2234  AST 25  ALT 16  ALKPHOS 74  BILITOT 0.8  PROT 7.9  ALBUMIN 3.6   No results for input(s): "LIPASE", "AMYLASE" in the last 168 hours. No results for input(s): "AMMONIA" in the last 168 hours.  ABG    Component Value Date/Time   HCO3 18.9 (L) 01/06/2022 1854   ACIDBASEDEF 6.1 (H) 01/06/2022 1854   O2SAT PENDING 01/06/2022 1854     Coagulation Profile: Recent Labs  Lab 01/05/22 2234  INR 1.2    Cardiac Enzymes: Recent Labs  Lab  01/05/22 2234  CKTOTAL 79    HbA1C: Hgb A1c MFr Bld  Date/Time Value Ref Range Status  03/07/2020 09:46 PM 5.0 4.8 - 5.6 % Final    Comment:    (NOTE) Pre diabetes:          5.7%-6.4%  Diabetes:              >6.4%  Glycemic control for   <7.0% adults with diabetes   01/10/2020 12:35 PM 4.6 (L) 4.8 - 5.6 % Final    Comment:    (NOTE)         Prediabetes: 5.7 - 6.4         Diabetes: >6.4         Glycemic control for adults with diabetes: <7.0     CBG: Recent Labs  Lab 01/06/22 1500 01/06/22 1624 01/06/22 1945 01/06/22 2357 01/07/22 0420  GLUCAP 171* 161* 248* 120* 171*    Allergies No Known Allergies     DVT/GI PRX  assessed I Assessed the need for Labs I Assessed the need for Foley I Assessed the need for Central Venous Line Family Discussion when available I Assessed the need for Mobilization I made an Assessment of medications to be adjusted accordingly Safety Risk assessment completed  CASE  DISCUSSED IN MULTIDISCIPLINARY ROUNDS WITH ICU TEAM     Critical Care Time devoted to patient care services described in this note is 45 minutes.  Critical care was necessary to treat or prevent imminent or life-threatening deterioration.   Lucie Leather, M.D.  [facility]  Medical Director [facility] Medical Director [facility] Cardio-Pulmonary Department

## 2022-01-07 NOTE — Progress Notes (Signed)
MD made aware of pts temp along with HR and WBC, prn tylenol ordered, will continure to monitor

## 2022-01-07 NOTE — Op Note (Signed)
[facility]  Percutaneous Study/Intervention Procedural Note   Date of Surgery: 01/07/2022  Surgeon(s):Jrue Jarriel    Assistants:none  Pre-operative Diagnosis: PAD with rest pain left lower extremity  Post-operative diagnosis:  Same  Procedure(s) Performed:             1.  Left lower extremity angiogram             2.  Mechanical thrombectomy of the left SFA, popliteal artery, tibioperoneal trunk, and posterior tibial artery             3.  Percutaneous transluminal angioplasty of the left posterior tibial artery with 2.5 mm diameter angioplasty balloon             4.  Percutaneous transluminal angioplasty of tibioperoneal trunk and distal popliteal artery with 4 mm diameter Lutonix drug-coated angioplasty balloon             5.  Percutaneous transluminal angioplasty of the left SFA and popliteal artery with 5 mm diameter by 30 cm length Lutonix drug-coated angioplasty balloon  6.  StarClose closure device right femoral artery  EBL: 200 cc  Contrast: 65 cc  Fluoro Time: 8.8 minutes  Moderate Conscious Sedation Time: approximately 39 minutes using 1.5 mg of Versed and 75 mcg of Fentanyl              Indications:  Patient is a 41 y.o.female with rest pain of the left lower extremity who has been getting overnight thrombolytic therapy. The patient is brought in for angiography for further evaluation and potential treatment.  Due to the limb threatening nature of the situation, angiogram was performed for attempted limb salvage. The patient is aware that if the procedure fails, amputation would be expected.  The patient also understands that even with successful revascularization, amputation may still be required due to the severity of the situation.  Risks and benefits are discussed and informed consent is obtained.   Procedure:  The patient was identified and appropriate procedural time out was performed.  The patient was then placed supine on the table and prepped  and draped in the usual sterile fashion. Moderate conscious sedation was administered during a face to face encounter with the patient throughout the procedure with my supervision of the RN administering medicines and monitoring the patient's vital signs, pulse oximetry, telemetry and mental status throughout from the start of the procedure until the patient was taken to the recovery room.  The V18 wire was placed down into the posterior tibial artery at the ankle and the thrombolytic catheter was removed.  Selective left lower extremity angiogram was then performed. This demonstrated residual occlusion of the left SFA and popliteal arteries with essentially no runoff distally. It was felt that it was in the patient's best interest to proceed with intervention after these images to avoid a second procedure and a larger amount of contrast and fluoroscopy based off of the findings from the initial angiogram.  I then used the penumbra CAT 6 catheter and made 2 passes in the left SFA, popliteal artery, and down into the tibioperoneal trunk and proximal posterior tibial artery there was significant constraint particularly within the distal SFA and popliteal artery limiting the catheter advancing.  I then performed balloon angioplasty of the left posterior tibial artery with a 2.5 mm diameter by 30 cm length angioplasty balloon and then used 2 inflations with a 5 mm diameter by 30 cm length Lutonix drug-coated angioplasty balloon in the left SFA and  popliteal arteries.  Following this, there remained residual occlusion.  Another pass was made with the penumbra CAT 6 catheter and this time I was able to get all the way down to the distal posterior tibial artery and a significant amount of thrombus was removed.  Following this, there was clearance of the stent and the posterior tibial artery now had flow into the foot although the tibioperoneal trunk still had thrombus and was narrowing.  When the sheath was pulled back to  the common femoral artery, there had thrombus development around the sheath in the proximal SFA.  Another pass with the penumbra CAT 6 catheter concentrating in the proximal SFA to clear this thrombus.  The thrombus and stenosis in the tibioperoneal trunk was addressed with a 4 mm diameter by 10 cm length Lutonix drug-coated angioplasty balloon back into the distal popliteal artery.  Now, after administration of intra-arterial nitroglycerin for spasm there was significantly improved flow in the flow appeared down onto the foot although the runoff was disadvantaged in the foot and ankle.  It was difficult to discern if this was distal embolization, damage from being ischemic 4 weeks, or spasm but at this point nothing further could be done from a procedural standpoint today.  I elected to terminate the procedure. The sheath was removed and StarClose closure device was deployed in the right femoral artery with excellent hemostatic result. The patient was taken to the recovery room in stable condition having tolerated the procedure well.  Findings:                            Left lower Extremity:  This demonstrated residual occlusion of the left SFA and popliteal arteries with essentially no runoff distally.   Disposition: Patient was taken to the recovery room in stable condition having tolerated the procedure well.  Complications: None  [name] 01/07/2022 11:55 AM   This note was created with Dragon Medical transcription system. Any errors in dictation are purely unintentional.

## 2022-01-08 ENCOUNTER — Encounter: Payer: Self-pay | Admitting: Vascular Surgery

## 2022-01-08 DIAGNOSIS — R2242 Localized swelling, mass and lump, left lower limb: Secondary | ICD-10-CM

## 2022-01-08 DIAGNOSIS — D509 Iron deficiency anemia, unspecified: Secondary | ICD-10-CM

## 2022-01-08 DIAGNOSIS — I10 Essential (primary) hypertension: Secondary | ICD-10-CM

## 2022-01-08 DIAGNOSIS — E119 Type 2 diabetes mellitus without complications: Secondary | ICD-10-CM

## 2022-01-08 DIAGNOSIS — R000 Tachycardia, unspecified: Secondary | ICD-10-CM

## 2022-01-08 LAB — GLUCOSE, CAPILLARY
Glucose-Capillary: 150 mg/dL — ABNORMAL HIGH (ref 70–99)
Glucose-Capillary: 185 mg/dL — ABNORMAL HIGH (ref 70–99)
Glucose-Capillary: 188 mg/dL — ABNORMAL HIGH (ref 70–99)
Glucose-Capillary: 188 mg/dL — ABNORMAL HIGH (ref 70–99)
Glucose-Capillary: 188 mg/dL — ABNORMAL HIGH (ref 70–99)

## 2022-01-08 LAB — BASIC METABOLIC PANEL
Anion gap: 13 (ref 5–15)
BUN: 27 mg/dL — ABNORMAL HIGH (ref 6–20)
CO2: 20 mmol/L — ABNORMAL LOW (ref 22–32)
Calcium: 8.6 mg/dL — ABNORMAL LOW (ref 8.9–10.3)
Chloride: 98 mmol/L (ref 98–111)
Creatinine, Ser: 2.21 mg/dL — ABNORMAL HIGH (ref 0.44–1.00)
GFR, Estimated: 28 mL/min — ABNORMAL LOW (ref >60)
Glucose, Bld: 149 mg/dL — ABNORMAL HIGH (ref 70–99)
Potassium: 4.2 mmol/L (ref 3.5–5.1)
Sodium: 131 mmol/L — ABNORMAL LOW (ref 135–145)

## 2022-01-08 LAB — CBC
HCT: 25.2 % — ABNORMAL LOW (ref 36.0–46.0)
Hemoglobin: 6.9 g/dL — ABNORMAL LOW (ref 12.0–15.0)
MCH: 18.9 pg — ABNORMAL LOW (ref 26.0–34.0)
MCHC: 27.4 g/dL — ABNORMAL LOW (ref 30.0–36.0)
MCV: 69 fL — ABNORMAL LOW (ref 80.0–100.0)
Platelets: 381 10*3/uL (ref 150–400)
RBC: 3.65 MIL/uL — ABNORMAL LOW (ref 3.87–5.11)
RDW: 27.6 % — ABNORMAL HIGH (ref 11.5–15.5)
WBC: 22.4 10*3/uL — ABNORMAL HIGH (ref 4.0–10.5)
nRBC: 0.4 % — ABNORMAL HIGH (ref 0.0–0.2)

## 2022-01-08 LAB — PHOSPHORUS: Phosphorus: 4.7 mg/dL — ABNORMAL HIGH (ref 2.5–4.6)

## 2022-01-08 LAB — MAGNESIUM: Magnesium: 2.4 mg/dL (ref 1.7–2.4)

## 2022-01-08 LAB — PREPARE RBC (CROSSMATCH)

## 2022-01-08 MED ORDER — SODIUM CHLORIDE 0.9% IV SOLUTION: Freq: Once | INTRAVENOUS | Status: AC

## 2022-01-08 MED ORDER — APIXABAN 5 MG PO TABS
5.0000 mg | ORAL_TABLET | Freq: Two times a day (BID) | ORAL | Status: DC
  Administered 2022-01-08 – 2022-01-09 (×3): 5 mg via ORAL
  Filled 2022-01-08 (×3): qty 1

## 2022-01-08 NOTE — Plan of Care (Signed)
Continuing with plan of care. 

## 2022-01-08 NOTE — Assessment & Plan Note (Signed)
Patient was on amlodipine at home.  Which was held due to softer blood pressure on presentation.  Blood pressure within goal today. -We can resume amlodipine if blood pressure started trending up

## 2022-01-08 NOTE — Assessment & Plan Note (Signed)
Patient with history of pretty significant PAD with multiple interventions. See above. -Continue with home Eliquis

## 2022-01-08 NOTE — Assessment & Plan Note (Signed)
Seems chronic and stable. -Continue to monitor

## 2022-01-08 NOTE — Assessment & Plan Note (Signed)
Patient has an history of iron deficiency anemia, hemoglobin was 5.6 on admission without any evidence of bleeding. Repeat iron studies are consistent with iron deficiency anemia. Patient received initially 2 unit of PRBC with improvement of hemoglobin to 7.9 which decreased to 6.9 this morning due to significant blood loss during procedure. -Ordered 1 more unit of PRBC -Continue home iron supplement

## 2022-01-08 NOTE — Progress Notes (Signed)
[facility]  Daily Progress Note   Subjective  -   Pain better.  Still swollen. Remains tachycardic  Objective Vitals:   01/08/22 0500 01/08/22 0700 01/08/22 0800 01/08/22 0900  BP:  (!) 135/92 (!) 139/95 (!) 134/110  Pulse:  (!) 114 (!) 114 (!) 130  Resp:  17 16 (!) 31  Temp:   98.4 F (36.9 C)   TempSrc:   Oral   SpO2:  98% 98% 98%  Weight: 85.5 kg     Height:        Intake/Output Summary (Last 24 hours) at 01/08/2022 0950 Last data filed at 01/08/2022 0900 Gross per 24 hour  Intake 507.43 ml  Output --  Net 507.43 ml    PULM  CTAB CV  RRR VASC  Left foot warmer, doppler signals present.  Sensation is better but diminished.  Limited motor function  Laboratory CBC    Component Value Date/Time   WBC 22.4 (H) 01/08/2022 0622   HGB 6.9 (L) 01/08/2022 0622   HGB 11.7 (L) 04/24/2013 1557   HCT 25.2 (L) 01/08/2022 0622   HCT 35.6 04/24/2013 1557   PLT 381 01/08/2022 0622   PLT 316 04/24/2013 1557    BMET    Component Value Date/Time   NA 131 (L) 01/08/2022 0622   NA 134 (L) 04/24/2013 1557   K 4.2 01/08/2022 0622   K 4.4 04/24/2013 1557   CL 98 01/08/2022 0622   CL 105 04/24/2013 1557   CO2 20 (L) 01/08/2022 0622   CO2 24 04/24/2013 1557   GLUCOSE 149 (H) 01/08/2022 0622   GLUCOSE 103 (H) 04/24/2013 1557   BUN 27 (H) 01/08/2022 0622   BUN 7 04/24/2013 1557   CREATININE 2.21 (H) 01/08/2022 0622   CREATININE 0.51 (L) 04/24/2013 1557   CALCIUM 8.6 (L) 01/08/2022 0622   CALCIUM 9.1 04/24/2013 1557   GFRNONAA 28 (L) 01/08/2022 0622   GFRNONAA >60 04/24/2013 1557   GFRAA >60 03/09/2020 0438   GFRAA >60 04/24/2013 1557    Assessment/Planning: POD #1/2 s/p LLE revascularization  Left foot is swollen but Doppler signal is present Needs PT to try to walk OK to start Eliquis today Ok to transfer to the floor.  [name]  01/08/2022, 9:50 AM

## 2022-01-08 NOTE — Progress Notes (Signed)
Aggrastat was stopped this morning and Dr. Wyn Quaker ordered for patient to start eliquis 5mg  po bid.

## 2022-01-08 NOTE — Assessment & Plan Note (Signed)
CBG mildly elevated.  Apparently she was not on any medication at home. -Check A1c -SSI

## 2022-01-08 NOTE — Progress Notes (Signed)
Progress Note   Patient: Jocelyn [name] MGQ:[id number] DOB: 07-25-1980 DOA: 01/05/2022     2 DOS: the patient was seen and examined on 01/08/2022   Brief hospital course: Taken from H&P and prior notes.  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   ED Course: In the emergency department, the temperature was 36.9C, the heart rate  170 beats/minute, the blood pressure 175/105 mm Hg, the respiratory rate 24 breaths/minute, and the oxygen saturation 100% on RA.  Pertinent Labs /Diagnostics Findings: Na+/ K+: 133/3.6, Glucose: 223, BUN/Cr.:12/1.16 ,CO2:17, Anion Gap:17, WBC/ TMAX: 21.5/ afebrile, Hgb/Hct: 5.6/23.2, PCT: 0.35, Lactic acid:1.9, CTA abdomen showed occlusion of the distal left superficial femoral artery at the level of the stent which extends into the popliteal artery with occluded stent otherwise no other acute findings in the abdomen and pelvis.   The laboratory data showed an anion gap, metabolic acidosis, and hyperglycemia consistent w/mild DKA.  Patient also with elevated white count and procal which was  concerning for possible underlying infection and hemoglobin of 5.6 with no obvious overt bleeding.  The management was initiated with IV heparin per vascular on-call surgeon's recommendations with the plan for possible intervention of ischemic LLE in the a.m.  Patient was also started on broad-spectrum antibiotics and received 2 unit of PRBC.  She was admitted under PCCM service.  Vascular surgery was consulted and she underwent left lower extremity angiogram  followed by  Mechanical thrombectomy of the left SFA, popliteal artery, tibioperoneal trunk, and posterior tibial artery  Percutaneous transluminal angioplasty of the left posterior tibial artery with 2.5 mm diameter angioplasty balloon  Percutaneous transluminal angioplasty of tibioperoneal trunk and distal popliteal artery with 4 mm diameter Lutonix drug-coated angioplasty balloon  Percutaneous transluminal angioplasty of the left SFA and popliteal artery with 5 mm diameter by 30 cm length Lutonix drug-coated angioplasty balloon  StarClose closure device right femoral artery.  TRH to resume care on 01/08/2022.  6/30: Patient remained stable.  Lower extremity pain improving, continue to have significant edema of left lower extremity.  Eliquis was restarted as advised by vascular surgery. She can be transitioned out of ICU. Hemoglobin at 6.9 today as she had significant blood loss during procedure yesterday, giving another unit of PRBC. Most likely be discharged tomorrow if remains stable.    Assessment and Plan: * Critical limb ischemia of left lower extremity (HCC) S/p mechanical thrombectomy and angioplasty by vascular surgery. Pain improving and lower extremity was warm today. Continue to have left lower extremity edema. -Vascular surgery started her on Eliquis. -Continue to monitor  IDA (iron deficiency anemia) Patient has an history of iron deficiency anemia, hemoglobin was 5.6 on admission without any evidence of bleeding. Repeat iron studies are consistent with iron deficiency anemia. Patient received initially 2 unit of PRBC with improvement of hemoglobin to 7.9 which decreased to 6.9 this morning due to significant blood loss during procedure. -Ordered 1 more unit of PRBC -Continue home iron supplement  Hyponatremia Seems chronic and stable. -Continue to monitor  HTN (hypertension) Patient was on amlodipine at home.  Which was held due to softer blood pressure on  presentation.  Blood pressure within goal today. -We can resume amlodipine if blood pressure started trending up  Diabetes mellitus without complication (HCC) CBG mildly elevated.  Apparently she was not on any medication at home. -Check A1c -SSI  PAD (peripheral artery disease) (HCC)-resolved as of 01/08/2022 Patient with history of pretty significant PAD with multiple interventions. See above. -Continue with home Eliquis   Subjective: Patient was seen and examined today.  Continued to have some left lower extremity pain but stating it is much improved than before.  No other complaints.  Physical Exam: Vitals:   01/08/22 1200 01/08/22 1339 01/08/22 1518 01/08/22 1559  BP: 131/84 (!) 164/90 (!) 141/88 135/89  Pulse: (!) 114 (!) 113 (!) 104 (!) 102  Resp: 20 18 18 18   Temp: 98.2 F (36.8 C) 98.6 F (37 C) 98.9 F (37.2 C) 98.1 F (36.7 C)  TempSrc: Oral  Oral Oral  SpO2: 98% 100% 100% 100%  Weight:      Height:       General.  Obese lady, in no acute distress. Pulmonary.  Lungs clear bilaterally, normal respiratory effort. CV.  Regular rate and rhythm, no JVD, rub or murmur. Abdomen.  Soft, nontender, nondistended, BS positive. CNS.  Alert and oriented .  No focal neurologic deficit. Extremities.  Left lower extremity edema, no cyanosis, pulses intact on Doppler Psychiatry.  Judgment and insight appears normal.  Data Reviewed: Prior data reviewed  Family Communication: Discussed with patient  Disposition: Status is: Inpatient Remains inpatient appropriate because: Severity of illness   Planned Discharge Destination: Home  DVT prophylaxis.  Eliquis Time spent: 50 minutes  This record has been created using . Errors have been sought and corrected,but may not always be located. Such creation errors do not reflect on the standard of care.  Author: Conservation officer, historic buildings, MD 01/08/2022 4:23 PM  For on call review [email or web address].

## 2022-01-08 NOTE — Assessment & Plan Note (Signed)
S/p mechanical thrombectomy and angioplasty by vascular surgery. Pain improving and lower extremity was warm today. Continue to have left lower extremity edema. -Vascular surgery started her on Eliquis. -Continue to monitor

## 2022-01-08 NOTE — Progress Notes (Signed)
1 unit blood transfusion completed.No sign of reaction noted a this time.

## 2022-01-08 NOTE — Progress Notes (Signed)
PAD removed and patient tolerated well, pressure held for one minute and then pressure dressing applied.  Patient requested to not have triple lumen catheter removed due to multiple blood draws, sterile dressing done and Dr. Wyn Quaker notified and ok for triple lumen catheter to remain at this time.

## 2022-01-08 NOTE — Progress Notes (Signed)
Per Dr. Wyn Quaker ok to d/c cardiac monitoring and continuous pulse ox.

## 2022-01-08 NOTE — Progress Notes (Signed)
Report given to receiving nurse on 2C, reported that patient is to receive 1 unit of blood of which had not been allocated yet, receiving nurse agrees to administer ordered blood when allocated and patient arrives to floor.

## 2022-01-09 LAB — TYPE AND SCREEN
ABO/RH(D): B POS
Antibody Screen: NEGATIVE
Unit division: 0
Unit division: 0
Unit division: 0
Unit division: 0

## 2022-01-09 LAB — BPAM RBC
Blood Product Expiration Date: 202307072359
Blood Product Expiration Date: 202307262359
Blood Product Expiration Date: 202307292359
Blood Product Expiration Date: 202307292359
ISSUE DATE / TIME: 202306280259
ISSUE DATE / TIME: 202306280743
ISSUE DATE / TIME: 202306281041
ISSUE DATE / TIME: 202306301536
Unit Type and Rh: 1700
Unit Type and Rh: 5100
Unit Type and Rh: 5100
Unit Type and Rh: 7300

## 2022-01-09 LAB — GLUCOSE, CAPILLARY
Glucose-Capillary: 150 mg/dL — ABNORMAL HIGH (ref 70–99)
Glucose-Capillary: 159 mg/dL — ABNORMAL HIGH (ref 70–99)
Glucose-Capillary: 159 mg/dL — ABNORMAL HIGH (ref 70–99)
Glucose-Capillary: 224 mg/dL — ABNORMAL HIGH (ref 70–99)

## 2022-01-09 LAB — CBC
HCT: 27.2 % — ABNORMAL LOW (ref 36.0–46.0)
Hemoglobin: 7.6 g/dL — ABNORMAL LOW (ref 12.0–15.0)
MCH: 19.5 pg — ABNORMAL LOW (ref 26.0–34.0)
MCHC: 27.9 g/dL — ABNORMAL LOW (ref 30.0–36.0)
MCV: 69.9 fL — ABNORMAL LOW (ref 80.0–100.0)
Platelets: 548 10*3/uL — ABNORMAL HIGH (ref 150–400)
RBC: 3.89 MIL/uL (ref 3.87–5.11)
RDW: 27.3 % — ABNORMAL HIGH (ref 11.5–15.5)
WBC: 18.5 10*3/uL — ABNORMAL HIGH (ref 4.0–10.5)
nRBC: 0.4 % — ABNORMAL HIGH (ref 0.0–0.2)

## 2022-01-09 LAB — MAGNESIUM: Magnesium: 2.4 mg/dL (ref 1.7–2.4)

## 2022-01-09 LAB — PHOSPHORUS: Phosphorus: 3.2 mg/dL (ref 2.5–4.6)

## 2022-01-09 MED ORDER — POLYETHYLENE GLYCOL 3350 17 G PO PACK: 17.0000 g | PACK | Freq: Every day | ORAL | 0 refills | Status: DC | PRN

## 2022-01-09 MED ORDER — AMLODIPINE BESYLATE 5 MG PO TABS: 5.0000 mg | ORAL_TABLET | Freq: Every day | ORAL | 1 refills | Status: DC

## 2022-01-09 MED ORDER — APIXABAN 5 MG PO TABS: 5.0000 mg | ORAL_TABLET | Freq: Two times a day (BID) | ORAL | Status: DC

## 2022-01-09 MED ORDER — OXYCODONE HCL 5 MG PO TABS
5.0000 mg | ORAL_TABLET | Freq: Four times a day (QID) | ORAL | 0 refills | Status: AC | PRN
Start: 2022-01-09 — End: 2022-01-14

## 2022-01-09 MED ORDER — DOCUSATE SODIUM 100 MG PO CAPS
100.0000 mg | ORAL_CAPSULE | Freq: Two times a day (BID) | ORAL | 0 refills | Status: DC | PRN
Start: 2022-01-09 — End: 2022-02-02

## 2022-01-09 MED ORDER — APIXABAN 5 MG PO TABS: 5.0000 mg | ORAL_TABLET | Freq: Two times a day (BID) | ORAL | 2 refills | Status: DC

## 2022-01-09 MED ORDER — GABAPENTIN 300 MG PO CAPS: 300.0000 mg | ORAL_CAPSULE | Freq: Three times a day (TID) | ORAL | 1 refills | Status: DC

## 2022-01-09 MED ORDER — OXYCODONE HCL 5 MG PO TABS: 5.0000 mg | ORAL_TABLET | Freq: Four times a day (QID) | ORAL | 0 refills | Status: DC | PRN

## 2022-01-09 MED ORDER — APIXABAN 5 MG PO TABS
5.0000 mg | ORAL_TABLET | Freq: Two times a day (BID) | ORAL | Status: DC
  Filled 2022-01-09: qty 3

## 2022-01-09 MED ORDER — APIXABAN 5 MG PO TABS
5.0000 mg | ORAL_TABLET | Freq: Two times a day (BID) | ORAL | Status: DC
  Administered 2022-01-09: 5 mg via ORAL
  Filled 2022-01-09: qty 1

## 2022-01-09 MED ORDER — RIVAROXABAN (XARELTO) VTE STARTER PACK (15 & 20 MG): ORAL_TABLET | ORAL | 0 refills | Status: DC

## 2022-01-09 NOTE — Discharge Summary (Signed)
Physician Discharge Summary   Patient: Jocelyn [name] MRN: [id number] DOB: 01/23/1981  Admit date:     01/05/2022  Discharge date: 01/09/22  Discharge Physician: Arnetha Courser   PCP: Center, [facility]   Recommendations at discharge:  Please obtain CBC and BMP in 1 week Follow-up with vascular surgery Follow-up with primary care provider  Discharge Diagnoses: Principal Problem:   Critical limb ischemia of left lower extremity (HCC) Active Problems:   IDA (iron deficiency anemia)   Diabetes mellitus without complication (HCC)   HTN (hypertension)   Hyponatremia  Hospital Course: Taken from H&P and prior notes.  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   ED Course: In the emergency department, the temperature was 36.9C, the heart rate  170 beats/minute, the blood pressure 175/105 mm Hg, the respiratory rate 24 breaths/minute, and the oxygen saturation 100% on RA.  Pertinent Labs /Diagnostics Findings: Na+/ K+: 133/3.6, Glucose: 223, BUN/Cr.:12/1.16 ,CO2:17, Anion Gap:17, WBC/ TMAX: 21.5/ afebrile, Hgb/Hct: 5.6/23.2, PCT: 0.35, Lactic acid:1.9, CTA abdomen showed occlusion of the distal left superficial femoral artery at the level of the stent which extends into the popliteal artery with occluded stent otherwise no other acute findings in the abdomen and pelvis.   The laboratory data showed an anion gap, metabolic acidosis, and hyperglycemia consistent w/mild DKA.  Patient also with elevated white count and procal which was  concerning for  possible underlying infection and hemoglobin of 5.6 with no obvious overt bleeding.  The management was initiated with IV heparin per vascular on-call surgeon's recommendations with the plan for possible intervention of ischemic LLE in the a.m.  Patient was also started on broad-spectrum antibiotics and received 2 unit of PRBC.  She was admitted under PCCM service.  Vascular surgery was consulted and she underwent left lower extremity angiogram followed by  Mechanical thrombectomy of the left SFA, popliteal artery, tibioperoneal trunk, and posterior tibial artery  Percutaneous transluminal angioplasty of the left posterior tibial artery with 2.5 mm diameter angioplasty balloon  Percutaneous transluminal angioplasty of tibioperoneal trunk and distal popliteal artery with 4 mm diameter Lutonix drug-coated angioplasty balloon  Percutaneous transluminal angioplasty of the left SFA and popliteal artery with 5 mm diameter by 30 cm length Lutonix drug-coated angioplasty balloon  StarClose closure device right femoral artery.  TRH to resume care on 01/08/2022.  6/30: Patient remained stable.  Lower extremity pain improving, continue to have significant edema of left lower extremity.  Eliquis was restarted as advised by vascular surgery. She can be transitioned out of ICU. Hemoglobin at 6.9 today as she had significant blood loss during procedure yesterday, giving another unit of PRBC. Most likely be discharged tomorrow if remains stable.  7/1: Patient clinically stable with improvement in left leg pain and edema.  Still having difficulty with ambulation, she was able to ambulate with the help of walker on flat surfaces but taking stairs was a challenge per PT evaluation.  They were recommending going to rehab but patient does not have any insurance.  Patient was provided with some Eliquis to be used until Monday and she can get her medications then for  medication management clinic. She was also given match  letter for current medications to be picked up at [facility].  Patient was counseled again to be compliant with her medications to avoid more progression of her severe PAD requiring multiple interventions.  She will continue on current medications and need to have a close follow-up with vascular surgery and PCP for further recommendations.  Assessment and Plan: * Critical limb ischemia of left lower extremity (HCC) S/p mechanical thrombectomy and angioplasty by vascular surgery. Pain improving and lower extremity was warm today. Continue to have left lower extremity edema. -Vascular surgery started her on Eliquis. -Continue to monitor  IDA (iron deficiency anemia) Patient has an history of iron deficiency anemia, hemoglobin was 5.6 on admission without any evidence of bleeding. Repeat iron studies are consistent with iron deficiency anemia. Patient received initially 2 unit of PRBC with improvement of hemoglobin to 7.9 which decreased to 6.9 this morning due to significant blood loss during procedure. -Ordered 1 more unit of PRBC -Continue home iron supplement  Hyponatremia Seems chronic and stable. -Continue to monitor  HTN (hypertension) Patient was on amlodipine at home.  Which was held due to softer blood pressure on presentation.  Blood pressure within goal today. -We can resume amlodipine if blood pressure started trending up  Diabetes mellitus without complication (HCC) CBG mildly elevated.  Apparently she was not on any medication at home. -Check A1c -SSI  PAD (peripheral artery disease) (HCC)-resolved as of 01/08/2022 Patient with history of pretty significant PAD with multiple interventions. See above. -Continue with home Eliquis   Consultants: Vascular surgery Procedures performed: Lower extremity angioplasty Disposition: Home Diet recommendation:  Discharge Diet Orders (From admission, onward)     Start     Ordered   01/09/22 0000  Diet - low sodium heart  healthy        01/09/22 1312           Cardiac and Carb modified diet DISCHARGE MEDICATION: Allergies as of 01/09/2022   No Known Allergies      Medication List     TAKE these medications    amLODipine 5 MG tablet Commonly known as: NORVASC Take 5 mg by mouth daily.   apixaban 5 MG Tabs tablet Commonly known as: ELIQUIS Take 1 tablet (5 mg total) by mouth 2 (two) times daily. Start taking on: January 11, 2022 What changed: These instructions start on January 11, 2022. If you are unsure what to do until then, ask your doctor or other care provider.   docusate sodium 100 MG capsule Commonly known as: COLACE Take 1 capsule (100 mg total) by mouth 2 (two) times daily as needed for mild constipation.   gabapentin 300 MG capsule Commonly known as: NEURONTIN Take 300 mg by mouth 3 (three) times daily.   Iron 325 (65 Fe) MG Tabs Take 1 tablet (325 mg total) by mouth daily.   nicotine 21 mg/24hr patch Commonly known as: NICODERM CQ - dosed in mg/24 hours Place 1 patch (21 mg total) onto the skin daily.   oxyCODONE 5 MG immediate release tablet Commonly known as: Oxy IR/ROXICODONE Take 1 tablet (5 mg total) by mouth every 6 (six) hours as needed for breakthrough pain.   polyethylene glycol 17 g packet Commonly known as: MIRALAX / GLYCOLAX Take 17 g by mouth daily as needed for moderate constipation.        Follow-up Information     Dew, Marlow Baars, MD .   Specialties: Vascular Surgery, Radiology, Interventional  Cardiology Contact information: [address] [place] [address] [phone]         Center, [facility]. Schedule an appointment as soon as possible for a visit in 1 week(s).   Contact information: [address] [place] [address] [phone]                Discharge Exam: Filed Weights   01/06/22 1245 01/07/22 0948 01/08/22 0500  Weight: 85.7 kg 85.7 kg 85.5 kg   General.     In no acute distress. Pulmonary.  Lungs  clear bilaterally, normal respiratory effort. CV.  Regular rate and rhythm, no JVD, rub or murmur. Abdomen.  Soft, nontender, nondistended, BS positive. CNS.  Alert and oriented .  No focal neurologic deficit. Extremities.  No edema, no cyanosis, pulses intact and symmetrical. Psychiatry.  Judgment and insight appears normal.   Condition at discharge: stable  The results of significant diagnostics from this hospitalization (including imaging, microbiology, ancillary and laboratory) are listed below for reference.   Imaging Studies: PERIPHERAL VASCULAR CATHETERIZATION  Result Date: 01/07/2022 See surgical note for result.  PERIPHERAL VASCULAR CATHETERIZATION  Result Date: 01/06/2022 See surgical note for result.  DG Chest Portable 1 View  Result Date: 01/05/2022 CLINICAL DATA:  Tachycardia. EXAM: PORTABLE CHEST 1 VIEW COMPARISON:  03/07/2020, lung bases from abdominal CT earlier today. FINDINGS: The cardiomediastinal contours are normal. The lungs are clear. Pulmonary vasculature is normal. No consolidation, pleural effusion, or pneumothorax. No acute osseous abnormalities are seen. IMPRESSION: No acute chest findings. Electronically Signed   By: Narda Rutherford M.D.   On: 01/05/2022 23:48   CT Angio Aortobifemoral W and/or Wo Contrast  Result Date: 01/05/2022 CLINICAL DATA:  Arterial embolism, lower extremity. LEFT LOWER LEG PAIN EXAM: CT ANGIOGRAPHY OF ABDOMINAL AORTA WITH ILIOFEMORAL RUNOFF TECHNIQUE: Multidetector CT imaging of the abdomen, pelvis and lower extremities was performed using the standard protocol during bolus administration of intravenous contrast. Multiplanar CT image reconstructions and MIPs were obtained to evaluate the vascular anatomy. RADIATION DOSE REDUCTION: This exam was performed according to the departmental dose-optimization program which includes automated exposure control, adjustment of the mA and/or kV according to patient size and/or use of iterative  reconstruction technique. CONTRAST:  OMNIPAQUE IOHEXOL 350 MG/ML SOLN COMPARISON:  None Available. FINDINGS: VASCULAR Aorta: No aneurysm or dissection. Infrarenal atherosclerotic calcifications. Celiac: Patent without evidence of aneurysm, dissection, vasculitis or significant stenosis. SMA: Patent without evidence of aneurysm, dissection, vasculitis or significant stenosis. Renals: Both renal arteries are patent without evidence of aneurysm, dissection, vasculitis, fibromuscular dysplasia or significant stenosis. IMA: Patent without evidence of aneurysm, dissection, vasculitis or significant stenosis. RIGHT Lower Extremity Inflow: Common, internal and external iliac arteries are patent without evidence of aneurysm, dissection, vasculitis or significant stenosis. Outflow: Common, superficial and profunda femoral arteries and the popliteal artery are patent without evidence of aneurysm, dissection, vasculitis or significant stenosis. Runoff: Proximal trifurcation vessels are patent in the right calf. The vessels are not visualized beyond the mid calf. It is unclear if this is related to occlusion or bolus timing. LEFT Lower Extremity Inflow: Irregular thrombus extends into the lumen of the left common iliac artery. Left external iliac artery, internal iliac artery widely patent. Outflow: Common, superficial and profundus femoral arteries are patent. There is a stent noted in the distal superficial femoral artery and popliteal artery. No contrast is seen within the stent or below the stent. Runoff: Non opacification of the trifurcation vessels in the left calf. Veins: No obvious venous  abnormality within the limitations of this arterial phase study. Review of the MIP images confirms the above findings. NON-VASCULAR Lower chest: No acute findings Hepatobiliary: No focal hepatic abnormality. Gallbladder unremarkable. Pancreas: No focal abnormality or ductal dilatation. Spleen: No focal abnormality.  Normal size.  Adrenals/Urinary Tract: Areas of cortical thinning in the kidneys bilaterally. No renal or adrenal mass. No hydronephrosis. Urinary bladder unremarkable. Stomach/Bowel: Stomach, large and small bowel grossly unremarkable. Lymphatic: No adenopathy Reproductive: Uterus and adnexa unremarkable.  No mass. Other: No free fluid or free air. Musculoskeletal: No acute bony abnormality. IMPRESSION: VASCULAR Aortic atherosclerosis.  No aneurysm or dissection. Irregular plaque in the left common iliac artery extends into the lumen and could be a source of distal emboli. Occlusion of the distal left superficial femoral artery at the level of the stent which extends into the left popliteal artery. No flow seen within the stent. No flow seen within the trifurcation vessels in the left calf. Widely patent outflow and runoff in the right lower extremity to the level of the mid calf. Non visualization of the vessels below the mid calf. This is unclear if this is due to occlusion or bolus timing. NON-VASCULAR No acute findings in the abdomen or pelvis. Electronically Signed   By: Charlett Nose M.D.   On: 01/05/2022 23:20    Microbiology: Results for orders placed or performed during the hospital encounter of 01/05/22  Culture, blood (Routine X 2) w Reflex to ID Panel     Status: None (Preliminary result)   Collection Time: 01/05/22 11:54 PM   Specimen: BLOOD  Result Value Ref Range Status   Specimen Description BLOOD LEFT ASSIST CONTROL  Final   Special Requests   Final    BOTTLES DRAWN AEROBIC AND ANAEROBIC Blood Culture adequate volume   Culture   Final    NO GROWTH 3 DAYS Performed at [facility], [address] [place], [place] [address]    Report Status PENDING  Incomplete  Culture, blood (Routine X 2) w Reflex to ID Panel     Status: None (Preliminary result)   Collection Time: 01/06/22  2:08 AM   Specimen: BLOOD  Result Value Ref Range Status   Specimen Description BLOOD LEFT UPPER ARM  Final    Special Requests   Final    BOTTLES DRAWN AEROBIC AND ANAEROBIC Blood Culture adequate volume   Culture   Final    NO GROWTH 3 DAYS Performed at [facility], [address], [place], [place] [address]    Report Status PENDING  Incomplete  MRSA Next Gen by PCR, Nasal     Status: None   Collection Time: 01/06/22  4:30 PM   Specimen: Nasal Mucosa; Nasal Swab  Result Value Ref Range Status   MRSA by PCR Next Gen NOT DETECTED NOT DETECTED Final    Comment: (NOTE) The GeneXpert MRSA Assay (FDA approved for NASAL specimens only), is one component of a comprehensive MRSA colonization surveillance program. It is not intended to diagnose MRSA infection nor to guide or monitor treatment for MRSA infections. Test performance is not FDA approved in patients less than 28 years old. Performed at [facility], [address] [place], [place] [address]     Labs: CBC: Recent Labs  Lab 01/05/22 2234 01/06/22 0537 01/06/22 1747 01/06/22 2357 01/07/22 0640 01/08/22 0622 01/09/22 0918  WBC 21.5* 15.3*  --   --  22.4* 22.4* 18.5*  NEUTROABS 18.0*  --   --   --  18.6*  --   --   HGB 5.6* 6.4* 8.0* 8.1* 7.9* 6.9* 7.6*  HCT 23.2* 24.8* 28.3* 29.1* 28.1* 25.2* 27.2*  MCV 62.5* 66.3*  --   --  67.9* 69.0* 69.9*  PLT 404* 349  --   --  304 381 548*   Basic Metabolic Panel: Recent Labs  Lab 01/05/22 2234 01/06/22 0537 01/07/22 0640 01/08/22 0622 01/09/22 0918  NA 133* 131* 131* 131*  --   K 3.6 3.9 4.2 4.2  --   CL 99 98 100 98  --   CO2 17* 22 20* 20*  --   GLUCOSE 223* 112* 148* 149*  --   BUN 12 13 18  27*  --   CREATININE 1.16* 1.02* 1.17* 2.21*  --   CALCIUM 9.0 8.8* 8.5* 8.6*  --   MG 1.6* 2.5* 2.2 2.4 2.4  PHOS  --  4.1  --  4.7* 3.2   Liver Function Tests: Recent Labs  Lab 01/05/22 2234  AST 25  ALT 16  ALKPHOS 74  BILITOT 0.8  PROT 7.9  ALBUMIN 3.6   CBG: Recent Labs  Lab 01/08/22 2127 01/09/22 0022 01/09/22 0446 01/09/22 0821  01/09/22 1156  GLUCAP 185* 159* 150* 159* 224*    Discharge time spent: greater than 30 minutes.  This record has been created using Conservation officer, historic buildingsDragon voice recognition software. Errors have been sought and corrected,but may not always be located. Such creation errors do not reflect on the standard of care.   Signed: Arnetha CourserSumayya Kurstyn Larios, MD [facility] 01/09/2022

## 2022-01-09 NOTE — Evaluation (Signed)
Physical Therapy Evaluation Patient Details Name: Jocelyn [name] MRN: [id number] DOB: 09/26/1980 Today's Date: 01/09/2022  History of Present Illness  Pt is a 41 yo F s/p LLE revasularization 01/08/22. Significant past medical history Diabetes mellitus, hypertension, iron deficiency anemia, PAD with rest pain s/p LLE angiogram w/ stent placement on 01/11/2020  Clinical Impression  Pt was pleasant and motivated to participate during the session and put forth good effort throughout. Pt was able complete supine to sit w/ CGA w/ extra time and effort to complete; minA for sit to supine for LLE management. Pt was able to complete sit to stand CGA but very effortful and needing cuing for hand and foot placement. Pt ambulated 65ft using RW w/ CGA but very effortful and reliant on UE support; heavy cuing needed for sequencing of step to pattern demonstrating very limited wbing through LLE due to pain. Pt  able to complete steps x5 w/ minA for steadying; very effortful and needing cuing throughout for sequencing. Pt required bilat handrail for descent due to fatigue and pain in LLE. Pt will benefit from PT services in a SNF setting upon discharge to safely address deficits listed in patient problem list for decreased caregiver assistance and eventual return to PLOF.       Recommendations for follow up therapy are one component of a multi-disciplinary discharge planning process, led by the attending physician.  Recommendations may be updated based on patient status, additional functional criteria and insurance authorization.  Follow Up Recommendations Skilled nursing-short term rehab (<3 hours/day) Can patient physically be transported by private vehicle: Yes    Assistance Recommended at Discharge Intermittent Supervision/Assistance  Patient can return home with the following  A little help with walking and/or transfers;A little help with bathing/dressing/bathroom;Assistance with cooking/housework;Assist for  transportation;Help with stairs or ramp for entrance    Equipment Recommendations Rolling walker (2 wheels);BSC/3in1  Recommendations for Other Services       Functional Status Assessment Patient has had a recent decline in their functional status and demonstrates the ability to make significant improvements in function in a reasonable and predictable amount of time.     Precautions / Restrictions Precautions Precautions: Fall Restrictions Weight Bearing Restrictions: No      Mobility  Bed Mobility Overal bed mobility: Needs Assistance Bed Mobility: Supine to Sit, Sit to Supine     Supine to sit: Min guard Sit to supine: Min assist   General bed mobility comments: extra time and effort to complete, minA for LLE management for sit to supine    Transfers Overall transfer level: Needs assistance Equipment used: Rolling walker (2 wheels) Transfers: Sit to/from Stand Sit to Stand: Min guard           General transfer comment: verbal cuing needed for sequencing and for hand and foot placement    Ambulation/Gait Ambulation/Gait assistance: Min guard Gait Distance (Feet): 7 Feet Assistive device: Rolling walker (2 wheels) Gait Pattern/deviations: Step-to pattern, Decreased step length - right, Decreased step length - left, Decreased stance time - left, Antalgic Gait velocity: decreased     General Gait Details: slow and very effortful antalgic gait; heavy cuing needed for sequencing  Stairs Stairs: Yes Stairs assistance: Min assist Stair Management: One rail Left, Step to pattern, Backwards, Forwards, Two rails Number of Stairs: 5 General stair comments: very effortful and requiring minA for steadying; verbal cuing for sequencing. bilat handrails needed for descent.  Wheelchair Mobility    Modified Rankin (Stroke Patients Only)  Balance Overall balance assessment: Needs assistance Sitting-balance support: Bilateral upper extremity supported, Feet  supported       Standing balance support: Bilateral upper extremity supported, During functional activity Standing balance-Leahy Scale: Fair                               Pertinent Vitals/Pain Pain Assessment Pain Assessment: 0-10 Pain Score: 5  Pain Location: LLE Pain Descriptors / Indicators: Aching, Constant, Grimacing, Tender Pain Intervention(s): Monitored during session, Premedicated before session, Repositioned    Home Living Family/patient expects to be discharged to:: Private residence Living Arrangements: Children Available Help at Discharge: Family;Available PRN/intermittently Type of Home: Apartment Home Access: Stairs to enter Entrance Stairs-Rails: Left Entrance Stairs-Number of Steps: 11 w/ landing in halfway   Home Layout: One level Home Equipment: Grab bars - tub/shower      Prior Function Prior Level of Function : Independent/Modified Independent             Mobility Comments: Ind community ambulator, no hx of falls ADLs Comments: Ind w/ ADLs     Hand Dominance        Extremity/Trunk Assessment   Upper Extremity Assessment Upper Extremity Assessment: Overall WFL for tasks assessed    Lower Extremity Assessment Lower Extremity Assessment: Generalized weakness       Communication   Communication: No difficulties  Cognition Arousal/Alertness: Awake/alert Behavior During Therapy: WFL for tasks assessed/performed Overall Cognitive Status: Within Functional Limits for tasks assessed                                          General Comments      Exercises Other Exercises Other Exercises: Pt education on stair and gait sequencing   Assessment/Plan    PT Assessment Patient needs continued PT services  PT Problem List Decreased strength;Decreased mobility;Decreased activity tolerance;Decreased balance;Decreased knowledge of use of DME       PT Treatment Interventions DME instruction;Therapeutic  exercise;Gait training;Balance training;Stair training;Functional mobility training;Therapeutic activities;Patient/family education    PT Goals (Current goals can be found in the Care Plan section)  Acute Rehab PT Goals Patient Stated Goal: Pt would like to build back strength and endurance PT Goal Formulation: With patient Time For Goal Achievement: 01/22/22 Potential to Achieve Goals: Fair    Frequency Min 2X/week     Co-evaluation               AM-PAC PT "6 Clicks" Mobility  Outcome Measure Help needed turning from your back to your side while in a flat bed without using bedrails?: A Little Help needed moving from lying on your back to sitting on the side of a flat bed without using bedrails?: A Little Help needed moving to and from a bed to a chair (including a wheelchair)?: A Little Help needed standing up from a chair using your arms (e.g., wheelchair or bedside chair)?: A Little Help needed to walk in hospital room?: A Little Help needed climbing 3-5 steps with a railing? : A Little 6 Click Score: 18    End of Session Equipment Utilized During Treatment: Gait belt Activity Tolerance: Patient tolerated treatment well Patient left: in chair;with call bell/phone within reach Nurse Communication: Mobility status PT Visit Diagnosis: Difficulty in walking, not elsewhere classified (R26.2);Muscle weakness (generalized) (M62.81);Pain Pain - Right/Left: Left Pain - part of body:  Leg    Time: 7782-4235 and (1334 - 1412 w/ break between for pain management) PT Time Calculation (min) (ACUTE ONLY): 11 min   Charges:             Marica Otter, SPT  01/09/2022, 3:41 PM

## 2022-01-09 NOTE — TOC CM/SW Note (Addendum)
Spoke with patient this morning who says she cannot afford copays for [facility] letter.   1:50- Met with patient at bedside to provide [facility] letter, petty cash, and Xarelto coupon. Patient states she does not need assistance with medications. Patient states she gets insurance effective Monday, and was provided with enough medications form Pharmacy to last her until she can get her prescriptions on Monday.  Returned petty cash to [employer]. [facility] letter cancelled.   2:33- PT rec SNF. Per MD, patient to DC home with charity [facility] and EMS transport.  CSW called [facility], spoke with Gilford Rile who stated they should be able to take patient up the steps into her apartment.  CSW called [facility] weekend line, spoke to [place]. She stated she needs to check with their clinical team to see if they can accept patient.   3:23- Eliezer Lofts at [facility] requesting an update. She stated she is still waiting for an update from their clinical team.   3:29- Spoke to patient with update. Patient says she does not want charity [facility] or SNF. Patient states she will return home and her daughter is home to help her. Patient is agreeable to a RW being ordered as recommended by PT. Patient is also agreeable to EMS transport to her home address [place], [place], [place]. Care team updated. RW ordered through [facility] with [employer]. Jasmine stated they can deliver to the home tomorrow/Monday or to the room today (but EMS will not transport it so family will need to pick it up). Patient states she prefers it be delivered to the home, is aware it would be tomorrow or Monday.  Notified Lattie Haw at [facility] to cancel [facility] referral. EMS paperwork completed.   3:37- Per PT patient also needs BSC. Added to [employer] order for home delivery.  Confirmed with RN patient ready for transport.  [facility] arranged to [place], [place]. Patient is next on the list. RN notified.    Oleh Genin, [place]

## 2022-01-09 NOTE — Progress Notes (Signed)
    Subjective  - POD #2/3  Has been able to urinate Patient states the same.   Physical Exam:  Baseline motor and sensory function. LEFT FOOT IS WARM AND WELL PERFUSED Right groin remains soft        Assessment/Plan:  POD #2/3  Continue Eliquis Limited options for revascularization if this fails Continue to encourage mobilization Home when cleared by PT  [facility] Sherrin Stahle 01/09/2022 12:54 PM --  Vitals:   01/09/22 0535 01/09/22 0823  BP: (!) 151/86 (!) 145/98  Pulse: (!) 105 98  Resp: 16 18  Temp: 98.8 F (37.1 C) 98.8 F (37.1 C)  SpO2: 97% 99%    Intake/Output Summary (Last 24 hours) at 01/09/2022 1254 Last data filed at 01/09/2022 1100 Gross per 24 hour  Intake 1567.5 ml  Output --  Net 1567.5 ml     Laboratory CBC    Component Value Date/Time   WBC 18.5 (H) 01/09/2022 0918   HGB 7.6 (L) 01/09/2022 0918   HGB 11.7 (L) 04/24/2013 1557   HCT 27.2 (L) 01/09/2022 0918   HCT 35.6 04/24/2013 1557   PLT 548 (H) 01/09/2022 0918   PLT 316 04/24/2013 1557    BMET    Component Value Date/Time   NA 131 (L) 01/08/2022 0622   NA 134 (L) 04/24/2013 1557   K 4.2 01/08/2022 0622   K 4.4 04/24/2013 1557   CL 98 01/08/2022 0622   CL 105 04/24/2013 1557   CO2 20 (L) 01/08/2022 0622   CO2 24 04/24/2013 1557   GLUCOSE 149 (H) 01/08/2022 0622   GLUCOSE 103 (H) 04/24/2013 1557   BUN 27 (H) 01/08/2022 0622   BUN 7 04/24/2013 1557   CREATININE 2.21 (H) 01/08/2022 0622   CREATININE 0.51 (L) 04/24/2013 1557   CALCIUM 8.6 (L) 01/08/2022 0622   CALCIUM 9.1 04/24/2013 1557   GFRNONAA 28 (L) 01/08/2022 0622   GFRNONAA >60 04/24/2013 1557   GFRAA >60 03/09/2020 0438   GFRAA >60 04/24/2013 1557    COAG Lab Results  Component Value Date   INR 1.2 01/05/2022   INR 1.0 03/06/2020   INR 1.0 01/10/2020   No results found for: "PTT"  Antibiotics Anti-infectives (From admission, onward)    Start     Dose/Rate Route Frequency Ordered Stop   01/07/22 1003   ceFAZolin (ANCEF) IVPB 2g/100 mL premix        2 g 200 mL/hr over 30 Minutes Intravenous 30 min pre-op 01/07/22 1003 01/07/22 1119   01/06/22 1350  ceFAZolin (ANCEF) IVPB 1 g/50 mL premix  Status:  Discontinued        over 30 Minutes  Continuous PRN 01/06/22 1351 01/06/22 1521   01/06/22 1207  ceFAZolin (ANCEF) IVPB 2g/100 mL premix  Status:  Discontinued        2 g 200 mL/hr over 30 Minutes Intravenous 30 min pre-op 01/06/22 1207 01/06/22 1541   01/06/22 0015  cefTRIAXone (ROCEPHIN) 1 g in sodium chloride 0.9 % 100 mL IVPB        1 g 200 mL/hr over 30 Minutes Intravenous  Once 01/06/22 0001 01/06/22 0240        V. Charlena Cross, M.D., [facility] [place] Office: [phone] Pager:  [phone]

## 2022-01-09 NOTE — Progress Notes (Signed)
Patient awaiting EMS transport pick-up.

## 2022-01-09 NOTE — Progress Notes (Signed)
Patient is not able to walk the distance required to go the bathroom, or he/she is unable to safely negotiate stairs required to access the bathroom.  A 3in1 BSC will alleviate this problem  

## 2022-01-09 NOTE — Progress Notes (Signed)
PT Cancellation Note  Patient Details Name: Jocelyn [name] MRN: [id number] DOB: [birth year]   Cancelled Treatment:    Reason Eval/Treat Not Completed: Pt declined to participate with PT services this AM with pain limiting ability to participate, nursing notified.  Will attempt to see pt at a future date/time as medically appropriate.     Ovidio Hanger PT, DPT 01/09/22, 12:10 PM

## 2022-01-11 LAB — CULTURE, BLOOD (ROUTINE X 2)
Culture: NO GROWTH
Culture: NO GROWTH
Special Requests: ADEQUATE
Special Requests: ADEQUATE

## 2022-01-14 LAB — BLOOD GAS, VENOUS
Acid-base deficit: 6.1 mmol/L — ABNORMAL HIGH (ref 0.0–2.0)
Bicarbonate: 18.9 mmol/L — ABNORMAL LOW (ref 20.0–28.0)
Patient temperature: 37
pCO2, Ven: 35 mmHg — ABNORMAL LOW (ref 44–60)
pH, Ven: 7.34 (ref 7.25–7.43)
pO2, Ven: 173 mmHg — ABNORMAL HIGH (ref 32–45)

## 2022-01-17 ENCOUNTER — Emergency Department: Payer: Medicaid Other

## 2022-01-17 ENCOUNTER — Other Ambulatory Visit: Payer: Self-pay

## 2022-01-17 ENCOUNTER — Encounter
Admission: EM | Disposition: A | Discharge status: Rehabilitation Facility | Payer: Self-pay | Source: Home / Self Care | Attending: Hospitalist

## 2022-01-17 ENCOUNTER — Inpatient Hospital Stay
Admission: EM | Admit: 2022-01-17 | Discharge: 2022-01-25 | DRG: 271 | Disposition: A | Discharge status: Rehabilitation Facility | Payer: Medicaid Other | Attending: Internal Medicine | Admitting: Internal Medicine

## 2022-01-17 DIAGNOSIS — E119 Type 2 diabetes mellitus without complications: Secondary | ICD-10-CM

## 2022-01-17 DIAGNOSIS — D75839 Thrombocytosis, unspecified: Secondary | ICD-10-CM | POA: Diagnosis present

## 2022-01-17 DIAGNOSIS — I82422 Acute embolism and thrombosis of left iliac vein: Secondary | ICD-10-CM | POA: Diagnosis not present

## 2022-01-17 DIAGNOSIS — R509 Fever, unspecified: Secondary | ICD-10-CM | POA: Diagnosis not present

## 2022-01-17 DIAGNOSIS — I998 Other disorder of circulatory system: Secondary | ICD-10-CM

## 2022-01-17 DIAGNOSIS — Z5320 Procedure and treatment not carried out because of patient's decision for unspecified reasons: Secondary | ICD-10-CM | POA: Diagnosis present

## 2022-01-17 DIAGNOSIS — I10 Essential (primary) hypertension: Secondary | ICD-10-CM | POA: Diagnosis not present

## 2022-01-17 DIAGNOSIS — E1169 Type 2 diabetes mellitus with other specified complication: Secondary | ICD-10-CM

## 2022-01-17 DIAGNOSIS — E1152 Type 2 diabetes mellitus with diabetic peripheral angiopathy with gangrene: Secondary | ICD-10-CM | POA: Diagnosis not present

## 2022-01-17 DIAGNOSIS — D509 Iron deficiency anemia, unspecified: Secondary | ICD-10-CM | POA: Diagnosis present

## 2022-01-17 DIAGNOSIS — I70262 Atherosclerosis of native arteries of extremities with gangrene, left leg: Secondary | ICD-10-CM | POA: Diagnosis not present

## 2022-01-17 DIAGNOSIS — D62 Acute posthemorrhagic anemia: Secondary | ICD-10-CM | POA: Diagnosis not present

## 2022-01-17 DIAGNOSIS — Z8249 Family history of ischemic heart disease and other diseases of the circulatory system: Secondary | ICD-10-CM | POA: Diagnosis not present

## 2022-01-17 DIAGNOSIS — Z635 Disruption of family by separation and divorce: Secondary | ICD-10-CM

## 2022-01-17 DIAGNOSIS — Z79899 Other long term (current) drug therapy: Secondary | ICD-10-CM | POA: Diagnosis not present

## 2022-01-17 DIAGNOSIS — M62838 Other muscle spasm: Secondary | ICD-10-CM | POA: Diagnosis not present

## 2022-01-17 DIAGNOSIS — I70222 Atherosclerosis of native arteries of extremities with rest pain, left leg: Secondary | ICD-10-CM | POA: Diagnosis not present

## 2022-01-17 DIAGNOSIS — I739 Peripheral vascular disease, unspecified: Secondary | ICD-10-CM | POA: Diagnosis not present

## 2022-01-17 DIAGNOSIS — R339 Retention of urine, unspecified: Secondary | ICD-10-CM | POA: Diagnosis not present

## 2022-01-17 DIAGNOSIS — N179 Acute kidney failure, unspecified: Secondary | ICD-10-CM | POA: Diagnosis present

## 2022-01-17 DIAGNOSIS — E876 Hypokalemia: Secondary | ICD-10-CM | POA: Diagnosis not present

## 2022-01-17 DIAGNOSIS — I70209 Unspecified atherosclerosis of native arteries of extremities, unspecified extremity: Secondary | ICD-10-CM

## 2022-01-17 DIAGNOSIS — I743 Embolism and thrombosis of arteries of the lower extremities: Secondary | ICD-10-CM | POA: Diagnosis present

## 2022-01-17 DIAGNOSIS — Z89612 Acquired absence of left leg above knee: Secondary | ICD-10-CM | POA: Diagnosis not present

## 2022-01-17 DIAGNOSIS — I82402 Acute embolism and thrombosis of unspecified deep veins of left lower extremity: Secondary | ICD-10-CM | POA: Diagnosis not present

## 2022-01-17 DIAGNOSIS — I824Z2 Acute embolism and thrombosis of unspecified deep veins of left distal lower extremity: Secondary | ICD-10-CM

## 2022-01-17 DIAGNOSIS — I96 Gangrene, not elsewhere classified: Secondary | ICD-10-CM | POA: Diagnosis not present

## 2022-01-17 DIAGNOSIS — R338 Other retention of urine: Secondary | ICD-10-CM

## 2022-01-17 DIAGNOSIS — Z7901 Long term (current) use of anticoagulants: Secondary | ICD-10-CM

## 2022-01-17 DIAGNOSIS — I82412 Acute embolism and thrombosis of left femoral vein: Secondary | ICD-10-CM | POA: Diagnosis present

## 2022-01-17 DIAGNOSIS — F17210 Nicotine dependence, cigarettes, uncomplicated: Secondary | ICD-10-CM | POA: Diagnosis present

## 2022-01-17 DIAGNOSIS — I70202 Unspecified atherosclerosis of native arteries of extremities, left leg: Secondary | ICD-10-CM | POA: Diagnosis not present

## 2022-01-17 DIAGNOSIS — R000 Tachycardia, unspecified: Secondary | ICD-10-CM | POA: Diagnosis not present

## 2022-01-17 HISTORY — PX: LOWER EXTREMITY INTERVENTION: CATH118252

## 2022-01-17 LAB — CBC
HCT: 25.5 % — ABNORMAL LOW (ref 36.0–46.0)
Hemoglobin: 7 g/dL — ABNORMAL LOW (ref 12.0–15.0)
MCH: 20 pg — ABNORMAL LOW (ref 26.0–34.0)
MCHC: 27.5 g/dL — ABNORMAL LOW (ref 30.0–36.0)
MCV: 72.9 fL — ABNORMAL LOW (ref 80.0–100.0)
Platelets: 651 10*3/uL — ABNORMAL HIGH (ref 150–400)
RBC: 3.5 MIL/uL — ABNORMAL LOW (ref 3.87–5.11)
RDW: 27.8 % — ABNORMAL HIGH (ref 11.5–15.5)
WBC: 13.6 10*3/uL — ABNORMAL HIGH (ref 4.0–10.5)
nRBC: 0.1 % (ref 0.0–0.2)

## 2022-01-17 LAB — BASIC METABOLIC PANEL
Anion gap: 10 (ref 5–15)
BUN: 8 mg/dL (ref 6–20)
CO2: 22 mmol/L (ref 22–32)
Calcium: 9.4 mg/dL (ref 8.9–10.3)
Chloride: 103 mmol/L (ref 98–111)
Creatinine, Ser: 0.71 mg/dL (ref 0.44–1.00)
GFR, Estimated: >60 mL/min (ref >60)
Glucose, Bld: 118 mg/dL — ABNORMAL HIGH (ref 70–99)
Potassium: 3.8 mmol/L (ref 3.5–5.1)
Sodium: 135 mmol/L (ref 135–145)

## 2022-01-17 LAB — CBC WITH DIFFERENTIAL/PLATELET
Abs Immature Granulocytes: 0.15 10*3/uL — ABNORMAL HIGH (ref 0.00–0.07)
Basophils Absolute: 0.1 10*3/uL (ref 0.0–0.1)
Basophils Relative: 1 %
Eosinophils Absolute: 0.2 10*3/uL (ref 0.0–0.5)
Eosinophils Relative: 2 %
HCT: 27.6 % — ABNORMAL LOW (ref 36.0–46.0)
Hemoglobin: 7.4 g/dL — ABNORMAL LOW (ref 12.0–15.0)
Immature Granulocytes: 1 %
Lymphocytes Relative: 14 %
Lymphs Abs: 1.8 10*3/uL (ref 0.7–4.0)
MCH: 19.7 pg — ABNORMAL LOW (ref 26.0–34.0)
MCHC: 26.8 g/dL — ABNORMAL LOW (ref 30.0–36.0)
MCV: 73.6 fL — ABNORMAL LOW (ref 80.0–100.0)
Monocytes Absolute: 0.8 10*3/uL (ref 0.1–1.0)
Monocytes Relative: 6 %
Neutro Abs: 10.2 10*3/uL — ABNORMAL HIGH (ref 1.7–7.7)
Neutrophils Relative %: 76 %
Platelets: 800 10*3/uL — ABNORMAL HIGH (ref 150–400)
RBC: 3.75 MIL/uL — ABNORMAL LOW (ref 3.87–5.11)
RDW: 28.2 % — ABNORMAL HIGH (ref 11.5–15.5)
WBC: 13.3 10*3/uL — ABNORMAL HIGH (ref 4.0–10.5)
nRBC: 0.2 % (ref 0.0–0.2)

## 2022-01-17 LAB — PROCALCITONIN: Procalcitonin: 0.11 ng/mL

## 2022-01-17 LAB — GLUCOSE, CAPILLARY
Glucose-Capillary: 106 mg/dL — ABNORMAL HIGH (ref 70–99)
Glucose-Capillary: 142 mg/dL — ABNORMAL HIGH (ref 70–99)
Glucose-Capillary: 155 mg/dL — ABNORMAL HIGH (ref 70–99)
Glucose-Capillary: 120 mg/dL — ABNORMAL HIGH (ref 70–99)
Glucose-Capillary: 145 mg/dL — ABNORMAL HIGH (ref 70–99)

## 2022-01-17 LAB — HEPARIN LEVEL (UNFRACTIONATED)
Heparin Unfractionated: >1.1 IU/mL — ABNORMAL HIGH (ref 0.30–0.70)
Heparin Unfractionated: 0.81 IU/mL — ABNORMAL HIGH (ref 0.30–0.70)

## 2022-01-17 LAB — APTT
aPTT: 30 seconds (ref 24–36)
aPTT: 41 seconds — ABNORMAL HIGH (ref 24–36)
aPTT: 42 seconds — ABNORMAL HIGH (ref 24–36)

## 2022-01-17 LAB — HCG, QUANTITATIVE, PREGNANCY: hCG, Beta Chain, Quant, S: 1 m[IU]/mL (ref <5)

## 2022-01-17 LAB — PREPARE RBC (CROSSMATCH)

## 2022-01-17 LAB — HEMOGLOBIN AND HEMATOCRIT, BLOOD
HCT: 28.5 % — ABNORMAL LOW (ref 36.0–46.0)
Hemoglobin: 8.2 g/dL — ABNORMAL LOW (ref 12.0–15.0)

## 2022-01-17 LAB — TROPONIN I (HIGH SENSITIVITY)
Troponin I (High Sensitivity): 7 ng/L (ref <18)
Troponin I (High Sensitivity): 7 ng/L (ref <18)

## 2022-01-17 LAB — CREATININE, SERUM
Creatinine, Ser: 0.66 mg/dL (ref 0.44–1.00)
GFR, Estimated: >60 mL/min (ref >60)

## 2022-01-17 LAB — PROTIME-INR
INR: 1.2 (ref 0.8–1.2)
Prothrombin Time: 14.7 seconds (ref 11.4–15.2)

## 2022-01-17 LAB — BRAIN NATRIURETIC PEPTIDE: B Natriuretic Peptide: 59.2 pg/mL (ref 0.0–100.0)

## 2022-01-17 SURGERY — LOWER EXTREMITY INTERVENTION
Anesthesia: Moderate Sedation | Laterality: Bilateral

## 2022-01-17 MED ORDER — ACETAMINOPHEN 325 MG PO TABS
650.0000 mg | ORAL_TABLET | Freq: Four times a day (QID) | ORAL | Status: DC | PRN
  Administered 2022-01-17 – 2022-01-19 (×3): 650 mg via ORAL
  Filled 2022-01-17 (×3): qty 2

## 2022-01-17 MED ORDER — ACETAMINOPHEN 650 MG RE SUPP: 650.0000 mg | Freq: Four times a day (QID) | RECTAL | Status: DC | PRN

## 2022-01-17 MED ORDER — MIDAZOLAM HCL 5 MG/5ML IJ SOLN
INTRAMUSCULAR | Status: AC
  Filled 2022-01-17: qty 5

## 2022-01-17 MED ORDER — IODIXANOL 320 MG/ML IV SOLN
INTRAVENOUS | Status: DC | PRN
  Administered 2022-01-17: 55 mL via INTRA_ARTERIAL

## 2022-01-17 MED ORDER — ONDANSETRON HCL 4 MG/2ML IJ SOLN: 4.0000 mg | Freq: Four times a day (QID) | INTRAMUSCULAR | Status: DC | PRN

## 2022-01-17 MED ORDER — HEPARIN BOLUS VIA INFUSION
2000.0000 [IU] | Freq: Once | INTRAVENOUS | Status: AC
Start: 2022-01-17 — End: 2022-01-17
  Administered 2022-01-17: 2000 [IU] via INTRAVENOUS
  Filled 2022-01-17: qty 2000

## 2022-01-17 MED ORDER — DIPHENHYDRAMINE HCL 12.5 MG/5ML PO ELIX
12.5000 mg | ORAL_SOLUTION | Freq: Four times a day (QID) | ORAL | Status: DC | PRN
Start: 2022-01-17 — End: 2022-01-17

## 2022-01-17 MED ORDER — HEPARIN SODIUM (PORCINE) 1000 UNIT/ML IJ SOLN
INTRAMUSCULAR | Status: DC | PRN
  Administered 2022-01-17: 4000 [IU] via INTRAVENOUS

## 2022-01-17 MED ORDER — INSULIN ASPART 100 UNIT/ML IJ SOLN
0.0000 [IU] | Freq: Every day | INTRAMUSCULAR | Status: DC
  Administered 2022-01-20: 2 [IU] via SUBCUTANEOUS
  Filled 2022-01-17: qty 1

## 2022-01-17 MED ORDER — SODIUM CHLORIDE 0.9% FLUSH: 9.0000 mL | INTRAVENOUS | Status: DC | PRN

## 2022-01-17 MED ORDER — CHLORHEXIDINE GLUCONATE CLOTH 2 % EX PADS
6.0000 | MEDICATED_PAD | Freq: Every day | CUTANEOUS | Status: DC
  Administered 2022-01-17 – 2022-01-24 (×5): 6 via TOPICAL

## 2022-01-17 MED ORDER — HEPARIN SODIUM (PORCINE) 1000 UNIT/ML IJ SOLN
INTRAMUSCULAR | Status: AC
  Filled 2022-01-17: qty 10

## 2022-01-17 MED ORDER — MORPHINE SULFATE (PF) 4 MG/ML IV SOLN
4.0000 mg | Freq: Once | INTRAVENOUS | Status: AC
  Administered 2022-01-17: 4 mg via INTRAVENOUS
  Filled 2022-01-17: qty 1

## 2022-01-17 MED ORDER — HYDROMORPHONE HCL 1 MG/ML IJ SOLN
1.0000 mg | Freq: Once | INTRAMUSCULAR | Status: AC
  Administered 2022-01-17: 1 mg via INTRAVENOUS
  Filled 2022-01-17: qty 1

## 2022-01-17 MED ORDER — HYDRALAZINE HCL 20 MG/ML IJ SOLN
INTRAMUSCULAR | Status: AC
  Filled 2022-01-17: qty 1

## 2022-01-17 MED ORDER — HEPARIN (PORCINE) 25000 UT/250ML-% IV SOLN
1650.0000 [IU]/h | INTRAVENOUS | Status: DC
  Administered 2022-01-17: 1200 [IU]/h via INTRA_ARTERIAL
  Administered 2022-01-18: 1650 [IU]/h via INTRA_ARTERIAL
  Filled 2022-01-17: qty 250

## 2022-01-17 MED ORDER — ENOXAPARIN SODIUM 40 MG/0.4ML IJ SOSY: 40.0000 mg | PREFILLED_SYRINGE | INTRAMUSCULAR | Status: DC

## 2022-01-17 MED ORDER — DIPHENHYDRAMINE HCL 50 MG/ML IJ SOLN: 12.5000 mg | Freq: Four times a day (QID) | INTRAMUSCULAR | Status: DC | PRN

## 2022-01-17 MED ORDER — FENTANYL CITRATE (PF) 100 MCG/2ML IJ SOLN
25.0000 ug | Freq: Once | INTRAMUSCULAR | Status: AC
  Administered 2022-01-17: 25 ug via INTRAVENOUS

## 2022-01-17 MED ORDER — ALTEPLASE 2 MG IJ SOLR
INTRAMUSCULAR | Status: AC
  Filled 2022-01-17: qty 6

## 2022-01-17 MED ORDER — HEPARIN (PORCINE) 25000 UT/250ML-% IV SOLN
1200.0000 [IU]/h | INTRAVENOUS | Status: DC
  Administered 2022-01-17: 1200 [IU]/h via INTRAVENOUS
  Filled 2022-01-17: qty 250

## 2022-01-17 MED ORDER — FENTANYL CITRATE PF 50 MCG/ML IJ SOSY
PREFILLED_SYRINGE | INTRAMUSCULAR | Status: AC
  Filled 2022-01-17: qty 1

## 2022-01-17 MED ORDER — SODIUM CHLORIDE 0.9 % IV BOLUS (SEPSIS): 1000.0000 mL | Freq: Once | INTRAVENOUS | Status: DC

## 2022-01-17 MED ORDER — MORPHINE 100MG IN NS 100ML (1MG/ML) PREMIX INFUSION
2.6000 mg/h | INTRAVENOUS | Status: DC
  Administered 2022-01-17 – 2022-01-21 (×3): 2.6 mg/h via INTRAVENOUS
  Filled 2022-01-17 (×3): qty 100

## 2022-01-17 MED ORDER — LABETALOL HCL 5 MG/ML IV SOLN
INTRAVENOUS | Status: AC
  Filled 2022-01-17: qty 4

## 2022-01-17 MED ORDER — HYDROMORPHONE HCL 1 MG/ML IJ SOLN
INTRAMUSCULAR | Status: AC
  Filled 2022-01-17: qty 1

## 2022-01-17 MED ORDER — ONDANSETRON HCL 4 MG PO TABS: 4.0000 mg | ORAL_TABLET | Freq: Four times a day (QID) | ORAL | Status: DC | PRN

## 2022-01-17 MED ORDER — CEFAZOLIN SODIUM-DEXTROSE 1-4 GM/50ML-% IV SOLN
INTRAVENOUS | Status: AC | PRN
  Administered 2022-01-17: 2 g via INTRAVENOUS

## 2022-01-17 MED ORDER — HEPARIN (PORCINE) 25000 UT/250ML-% IV SOLN: 12.0000 [IU]/kg/h | INTRAVENOUS | Status: DC

## 2022-01-17 MED ORDER — ORAL CARE MOUTH RINSE: 15.0000 mL | OROMUCOSAL | Status: DC | PRN

## 2022-01-17 MED ORDER — NALOXONE HCL 0.4 MG/ML IJ SOLN: 0.4000 mg | INTRAMUSCULAR | Status: DC | PRN

## 2022-01-17 MED ORDER — HYDROMORPHONE 1 MG/ML IV SOLN: INTRAVENOUS | Status: DC

## 2022-01-17 MED ORDER — IOHEXOL 350 MG/ML SOLN
100.0000 mL | Freq: Once | INTRAVENOUS | Status: AC | PRN
  Administered 2022-01-17: 100 mL via INTRAVENOUS

## 2022-01-17 MED ORDER — MORPHINE SULFATE 1 MG/ML IV SOLN PCA
INTRAVENOUS | Status: DC
  Filled 2022-01-17 (×3): qty 30

## 2022-01-17 MED ORDER — ALTEPLASE 1 MG/ML SYRINGE FOR VASCULAR PROCEDURE
INTRAMUSCULAR | Status: DC | PRN
  Administered 2022-01-17: 4 mg via INTRA_ARTERIAL
  Administered 2022-01-17: 2 mg via INTRA_ARTERIAL

## 2022-01-17 MED ORDER — HEPARIN BOLUS VIA INFUSION
2200.0000 [IU] | Freq: Once | INTRAVENOUS | Status: DC
  Filled 2022-01-17: qty 2200

## 2022-01-17 MED ORDER — FENTANYL CITRATE (PF) 100 MCG/2ML IJ SOLN
INTRAMUSCULAR | Status: AC
  Filled 2022-01-17: qty 2

## 2022-01-17 MED ORDER — DIPHENHYDRAMINE HCL 12.5 MG/5ML PO ELIX: 12.5000 mg | ORAL_SOLUTION | Freq: Four times a day (QID) | ORAL | Status: DC | PRN

## 2022-01-17 MED ORDER — INSULIN ASPART 100 UNIT/ML IJ SOLN
0.0000 [IU] | Freq: Three times a day (TID) | INTRAMUSCULAR | Status: DC
  Administered 2022-01-17: 2 [IU] via SUBCUTANEOUS
  Administered 2022-01-17: 3 [IU] via SUBCUTANEOUS
  Administered 2022-01-18 – 2022-01-21 (×3): 2 [IU] via SUBCUTANEOUS
  Administered 2022-01-21 – 2022-01-22 (×2): 3 [IU] via SUBCUTANEOUS
  Administered 2022-01-24: 2 [IU] via SUBCUTANEOUS
  Filled 2022-01-17 (×9): qty 1

## 2022-01-17 MED ORDER — HYDROCODONE-ACETAMINOPHEN 5-325 MG PO TABS
1.0000 | ORAL_TABLET | ORAL | Status: DC | PRN
  Administered 2022-01-17 – 2022-01-19 (×7): 2 via ORAL
  Administered 2022-01-20: 1 via ORAL
  Administered 2022-01-20 – 2022-01-25 (×20): 2 via ORAL
  Filled 2022-01-17 (×28): qty 2

## 2022-01-17 MED ORDER — ONDANSETRON HCL 4 MG/2ML IJ SOLN
4.0000 mg | Freq: Once | INTRAMUSCULAR | Status: AC
  Administered 2022-01-17: 4 mg via INTRAVENOUS
  Filled 2022-01-17: qty 2

## 2022-01-17 MED ORDER — SODIUM CHLORIDE 0.9 % IV SOLN
1.0000 mg/h | INTRAVENOUS | Status: AC
  Administered 2022-01-17: 1 mg/h
  Filled 2022-01-17: qty 10

## 2022-01-17 MED ORDER — SODIUM CHLORIDE 0.9 % IV BOLUS (SEPSIS)
1000.0000 mL | Freq: Once | INTRAVENOUS | Status: AC
  Administered 2022-01-17: 1000 mL via INTRAVENOUS

## 2022-01-17 MED ORDER — HYDROMORPHONE HCL 1 MG/ML IJ SOLN
0.5000 mg | INTRAMUSCULAR | Status: DC | PRN
  Administered 2022-01-17: 1 mg via INTRAVENOUS

## 2022-01-17 MED ORDER — FENTANYL CITRATE (PF) 100 MCG/2ML IJ SOLN
INTRAMUSCULAR | Status: DC | PRN
  Administered 2022-01-17 (×3): 25 ug via INTRAVENOUS
  Administered 2022-01-17: 50 ug via INTRAVENOUS
  Administered 2022-01-17 (×2): 25 ug via INTRAVENOUS

## 2022-01-17 MED ORDER — LABETALOL HCL 5 MG/ML IV SOLN
INTRAVENOUS | Status: DC | PRN
  Administered 2022-01-17 (×2): 10 mg via INTRAVENOUS

## 2022-01-17 MED ORDER — HYDRALAZINE HCL 20 MG/ML IJ SOLN
INTRAMUSCULAR | Status: DC | PRN
  Administered 2022-01-17: 10 mg via INTRAVENOUS

## 2022-01-17 MED ORDER — DIPHENHYDRAMINE HCL 50 MG/ML IJ SOLN
12.5000 mg | Freq: Four times a day (QID) | INTRAMUSCULAR | Status: DC | PRN
  Administered 2022-01-20 – 2022-01-22 (×5): 12.5 mg via INTRAVENOUS
  Filled 2022-01-17 (×5): qty 1

## 2022-01-17 MED ORDER — MORPHINE SULFATE (PF) 2 MG/ML IV SOLN
2.0000 mg | INTRAVENOUS | Status: DC | PRN
  Administered 2022-01-17 – 2022-01-21 (×9): 2 mg via INTRAVENOUS
  Filled 2022-01-17 (×8): qty 1

## 2022-01-17 MED ORDER — DIPHENHYDRAMINE HCL 50 MG/ML IJ SOLN
12.5000 mg | Freq: Four times a day (QID) | INTRAMUSCULAR | Status: DC | PRN
Start: 2022-01-17 — End: 2022-01-17

## 2022-01-17 MED ORDER — SODIUM CHLORIDE 0.9% IV SOLUTION: Freq: Once | INTRAVENOUS | Status: DC

## 2022-01-17 MED ORDER — AMLODIPINE BESYLATE 5 MG PO TABS
5.0000 mg | ORAL_TABLET | Freq: Every day | ORAL | Status: DC
  Administered 2022-01-17 – 2022-01-24 (×8): 5 mg via ORAL
  Filled 2022-01-17 (×8): qty 1

## 2022-01-17 MED ORDER — SODIUM CHLORIDE 0.9 % IV SOLN: INTRAVENOUS | Status: DC | PRN

## 2022-01-17 MED ORDER — MIDAZOLAM HCL 2 MG/2ML IJ SOLN
INTRAMUSCULAR | Status: DC | PRN
  Administered 2022-01-17: 2 mg via INTRAVENOUS
  Administered 2022-01-17 (×5): 1 mg via INTRAVENOUS

## 2022-01-17 MED ORDER — MORPHINE SULFATE 1 MG/ML IV SOLN PCA
INTRAVENOUS | Status: DC
  Filled 2022-01-17: qty 30

## 2022-01-17 MED ORDER — SODIUM CHLORIDE 0.9 % IV SOLN
0.5000 mg/h | INTRAVENOUS | Status: AC
  Filled 2022-01-17 (×2): qty 10

## 2022-01-17 SURGICAL SUPPLY — 27 items
BALLN ULTRVRSE 3X300X150 (BALLOONS) ×2
BALLN ULTRVRSE 3X300X150 OTW (BALLOONS) ×1
BALLN ULTRVRSE 4X300X150 (BALLOONS) ×2
BALLOON ULTRVRSE 3X300X150 OTW (BALLOONS) IMPLANT
BALLOON ULTRVRSE 4X300X150 (BALLOONS) IMPLANT
CANISTER PENUMBRA ENGINE (MISCELLANEOUS) ×1 IMPLANT
CANNULA 5F STIFF (CANNULA) ×1 IMPLANT
CATH ANGIO 5F PIGTAIL 65CM (CATHETERS) ×1 IMPLANT
CATH BEACON 5 .035 65 KMP TIP (CATHETERS) ×1 IMPLANT
CATH INDIGO CAT6 KIT (CATHETERS) ×2 IMPLANT
CATH INFUS 135CMX50CM (CATHETERS) ×1 IMPLANT
CATH SEEKER .035X150CM (CATHETERS) ×1 IMPLANT
CATH TEMPO 5F RIM 65CM (CATHETERS) ×1 IMPLANT
COVER PROBE U/S 5X48 (MISCELLANEOUS) ×1 IMPLANT
DEVICE TORQUE .025-.038 (MISCELLANEOUS) ×1 IMPLANT
GLIDEWIRE ADV .035X260CM (WIRE) ×1 IMPLANT
GUIDEWIRE ANGLED .035 180CM (WIRE) ×1 IMPLANT
KIT CV MULTILUMEN 7FR 20 (SET/KITS/TRAYS/PACK) ×2
KIT CV MULTILUMEN 7FR 20 SUB (SET/KITS/TRAYS/PACK) IMPLANT
KIT ENCORE 26 ADVANTAGE (KITS) ×1 IMPLANT
SHEATH BRITE TIP 5FRX11 (SHEATH) ×1 IMPLANT
SHEATH PINNACLE ST 6F 45CM (SHEATH) ×1 IMPLANT
SUT SILK 0 FSL (SUTURE) ×1 IMPLANT
SYR MEDRAD MARK 7 150ML (SYRINGE) ×1 IMPLANT
TUBING CONTRAST HIGH PRESS 72 (TUBING) ×1 IMPLANT
WIRE G V18X300CM (WIRE) ×1 IMPLANT
WIRE GUIDERIGHT .035X150 (WIRE) ×1 IMPLANT

## 2022-01-17 NOTE — Assessment & Plan Note (Addendum)
Sliding scale insulin coverage 

## 2022-01-17 NOTE — ED Triage Notes (Addendum)
Pt comes from home via [facility] with c/o lower leg pain and swelling since she was discharged from here on 6/27. Per EMS, pt was able to wiggle toed and had sensation. Pt states SOB on exertion

## 2022-01-17 NOTE — Assessment & Plan Note (Addendum)
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

## 2022-01-17 NOTE — H&P (View-Only) (Signed)
Reason for Consult: Painful swollen left lower extremity, recurrent Referring Physician: Dr. Ward  Jocelyn [name] is an 41 y.o. female.  HPI: This patient was just discharged 1 week prior after having intervention performed over a 2-day period for left lower extremity ischemia.  This was not her first intervention.  She had 2 prior interventions done in 2021.  She continues to smoke, says she was taking her Eliquis.  Most recent intervention by Dr. Dew was mechanical thrombectomy, placement of a lytic catheter, then the following day mechanical thrombectomy once again with multiple areas of ballooning with and without drug-coated balloons.  Previous stents have been placed in 2021.  She says her leg was swollen at the time of discharge.  She says this is gotten worse over the last 2 days, leading to some blistering at the distal medial aspect of the lower extremity.  She said her foot has been cool.  CTA has been done, demonstrates flush occlusion of the left SFA.  Incidentally noted is prominent vein, suggesting DVT corroborated with PVL study.She has extensive left lower extremity DVT as well.  Again, she says that her left leg has been swollen from the time of discharge, unclear the relationship between deep vein thrombosis, and leg swelling in this instance.  Past Medical History:  Diagnosis Date   IDA (iron deficiency anemia) 12/17/2019    Past Surgical History:  Procedure Laterality Date   APPENDECTOMY     LOWER EXTREMITY ANGIOGRAPHY Left 01/11/2020   Procedure: Lower Extremity Angiography;  Surgeon: Dew, Jason S, MD;  Location: [facility];  Service: Cardiovascular;  Laterality: Left;   LOWER EXTREMITY ANGIOGRAPHY Left 03/06/2020   Procedure: Lower Extremity Angiography;  Surgeon: Dew, Jason S, MD;  Location: [facility];  Service: Cardiovascular;  Laterality: Left;   LOWER EXTREMITY ANGIOGRAPHY Left 03/07/2020   Procedure: Lower Extremity Angiography;  Surgeon: Dew,  Jason S, MD;  Location: [facility];  Service: Cardiovascular;  Laterality: Left;   LOWER EXTREMITY ANGIOGRAPHY Left 01/06/2022   Procedure: Lower Extremity Angiography;  Surgeon: Dew, Jason S, MD;  Location: [facility];  Service: Cardiovascular;  Laterality: Left;   LOWER EXTREMITY ANGIOGRAPHY Left 01/07/2022   Procedure: Lower Extremity Angiography;  Surgeon: Dew, Jason S, MD;  Location: [facility];  Service: Cardiovascular;  Laterality: Left;    Family History  Problem Relation Age of Onset   Hypertension Mother    Hypertension Sister    Hypertension Brother     Social History:  reports that she has been smoking cigarettes. She has been smoking an average of .5 packs per day. She has never used smokeless tobacco. She reports current alcohol use. She reports that she does not use drugs.  Allergies: No Known Allergies  Medications: I have reviewed the patient's current medications. Prior to Admission: (Not in a hospital admission)   Results for orders placed or performed during the hospital encounter of 01/17/22 (from the past 48 hour(s))  CBC with Differential/Platelet     Status: Abnormal   Collection Time: 01/17/22  1:16 AM  Result Value Ref Range   WBC 13.3 (H) 4.0 - 10.5 K/uL   RBC 3.75 (L) 3.87 - 5.11 MIL/uL   Hemoglobin 7.4 (L) 12.0 - 15.0 g/dL    Comment: Reticulocyte Hemoglobin testing may be clinically indicated, consider ordering this additional test [id number]    HCT 27.6 (L) 36.0 - 46.0 %   MCV 73.6 (L) 80.0 - 100.0 fL     MCH 19.7 (L) 26.0 - 34.0 pg   MCHC 26.8 (L) 30.0 - 36.0 g/dL   RDW 72.5 (H) 36.6 - 44.0 %   Platelets 800 (H) 150 - 400 K/uL   nRBC 0.2 0.0 - 0.2 %   Neutrophils Relative % 76 %   Neutro Abs 10.2 (H) 1.7 - 7.7 K/uL   Lymphocytes Relative 14 %   Lymphs Abs 1.8 0.7 - 4.0 K/uL   Monocytes Relative 6 %   Monocytes Absolute 0.8 0.1 - 1.0 K/uL   Eosinophils Relative 2 %   Eosinophils Absolute 0.2 0.0 - 0.5 K/uL    Basophils Relative 1 %   Basophils Absolute 0.1 0.0 - 0.1 K/uL   Immature Granulocytes 1 %   Abs Immature Granulocytes 0.15 (H) 0.00 - 0.07 K/uL    Comment: Performed at [facility], [address], [place], [place] [address]  Basic metabolic panel     Status: Abnormal   Collection Time: 01/17/22  1:16 AM  Result Value Ref Range   Sodium 135 135 - 145 mmol/L   Potassium 3.8 3.5 - 5.1 mmol/L   Chloride 103 98 - 111 mmol/L   CO2 22 22 - 32 mmol/L   Glucose, Bld 118 (H) 70 - 99 mg/dL    Comment: Glucose reference range applies only to samples taken after fasting for at least 8 hours.   BUN 8 6 - 20 mg/dL   Creatinine, Ser 5.95 0.44 - 1.00 mg/dL   Calcium 9.4 8.9 - 63.8 mg/dL   GFR, Estimated >75 >64 mL/min    Comment: (NOTE) Calculated using the CKD-EPI Creatinine Equation (2021)    Anion gap 10 5 - 15    Comment: Performed at [facility], [address] [place], [place] [address]  Protime-INR     Status: None   Collection Time: 01/17/22  1:16 AM  Result Value Ref Range   Prothrombin Time 14.7 11.4 - 15.2 seconds   INR 1.2 0.8 - 1.2    Comment: (NOTE) INR goal varies based on device and disease states. Performed at [facility], [address] [place], [place] [address]   Brain natriuretic peptide     Status: None   Collection Time: 01/17/22  1:16 AM  Result Value Ref Range   B Natriuretic Peptide 59.2 0.0 - 100.0 pg/mL    Comment: Performed at [facility], [address] [place], [place] [address]  Troponin I (High Sensitivity)     Status: None   Collection Time: 01/17/22  1:16 AM  Result Value Ref Range   Troponin I (High Sensitivity) 7 <18 ng/L    Comment: (NOTE) Elevated high sensitivity troponin I (hsTnI) values and significant  changes across serial measurements may suggest ACS but many other  chronic and acute conditions are known to elevate hsTnI results.  Refer to the "Links" section for chest pain algorithms and  additional  guidance. Performed at [facility], [address] [place], [place] [address]   hCG, quantitative, pregnancy     Status: None   Collection Time: 01/17/22  1:16 AM  Result Value Ref Range   hCG, Beta Chain, Quant, S 1 <5 mIU/mL    Comment:          GEST. AGE      CONC.  (mIU/mL)   <=1 WEEK        5 - 50     2 WEEKS       50 -  500     3 WEEKS       100 - 10,000     4 WEEKS     1,000 - 30,000     5 WEEKS     3,500 - 115,000   6-8 WEEKS     12,000 - 270,000    12 WEEKS     15,000 - 220,000        FEMALE AND NON-PREGNANT FEMALE:     LESS THAN 5 mIU/mL Performed at [facility], [address] [place], [place] [address]   Procalcitonin - Baseline     Status: None   Collection Time: 01/17/22  1:16 AM  Result Value Ref Range   Procalcitonin 0.11 ng/mL    Comment:        Interpretation: PCT (Procalcitonin) <= 0.5 ng/mL: Systemic infection (sepsis) is not likely. Local bacterial infection is possible. (NOTE)       Sepsis PCT Algorithm           Lower Respiratory Tract                                      Infection PCT Algorithm    ----------------------------     ----------------------------         PCT < 0.25 ng/mL                PCT < 0.10 ng/mL          Strongly encourage             Strongly discourage   discontinuation of antibiotics    initiation of antibiotics    ----------------------------     -----------------------------       PCT 0.25 - 0.50 ng/mL            PCT 0.10 - 0.25 ng/mL               OR       >80% decrease in PCT            Discourage initiation of                                            antibiotics      Encourage discontinuation           of antibiotics    ----------------------------     -----------------------------         PCT >= 0.50 ng/mL              PCT 0.26 - 0.50 ng/mL               AND        <80% decrease in PCT             Encourage initiation of                                             antibiotics        Encourage continuation           of antibiotics    ----------------------------     -----------------------------        PCT >= 0.50 ng/mL  PCT > 0.50 ng/mL               AND         increase in PCT                  Strongly encourage                                      initiation of antibiotics    Strongly encourage escalation           of antibiotics                                     -----------------------------                                           PCT <= 0.25 ng/mL                                                 OR                                        > 80% decrease in PCT                                      Discontinue / Do not initiate                                             antibiotics  Performed at [facility], [address] [place], [place] [address]   APTT     Status: None   Collection Time: 01/17/22  1:16 AM  Result Value Ref Range   aPTT 30 24 - 36 seconds    Comment: Performed at [facility], [address] [place], [place] [address]  Heparin level (unfractionated)     Status: Abnormal   Collection Time: 01/17/22  1:16 AM  Result Value Ref Range   Heparin Unfractionated >1.10 (H) 0.30 - 0.70 IU/mL    Comment: (NOTE) The clinical reportable range upper limit is being lowered to >1.10 to align with the FDA approved guidance for the current laboratory assay.  If heparin results are below expected values, and patient dosage has  been confirmed, suggest follow up testing of antithrombin III levels. Performed at [facility], [address] [place], [place] [address]   Type and screen [facility]     Status: None (Preliminary result)   Collection Time: 01/17/22  2:12 AM  Result Value Ref Range   ABO/RH(D) PENDING    Antibody Screen PENDING    Sample Expiration      01/20/2022,2359 Performed at [facility], [address] [place], [place] [address]     US  Venous Img Lower Unilateral Left  Result Date: 01/17/2022 CLINICAL DATA:  Left leg pain and swelling EXAM: LEFT LOWER EXTREMITY VENOUS DOPPLER ULTRASOUND TECHNIQUE: Gray-scale sonography with graded compression, as well as color Doppler and duplex ultrasound were performed to evaluate the lower extremity deep venous systems from the level of the common femoral vein and including the common femoral, femoral, profunda femoral, popliteal and calf veins including the posterior tibial, peroneal and gastrocnemius veins when visible. The superficial great saphenous vein was also interrogated. Spectral Doppler was utilized to evaluate flow at rest and with distal augmentation maneuvers in the common femoral, femoral and popliteal veins. COMPARISON:  None Available. FINDINGS: Contralateral Common Femoral Vein: Respiratory phasicity is normal and symmetric with the symptomatic side. No evidence of thrombus. Normal compressibility. Common Femoral Vein: Occlusive thrombus is noted with decreased compressibility and occlusive nature. Saphenofemoral Junction: Occlusive thrombus is noted with decreased compressibility Profunda Femoral Vein: Occlusive thrombus is noted with decreased compressibility. Femoral Vein: Occlusive thrombus is noted with decreased compressibility. Popliteal Vein: Occlusive thrombus is noted with decreased compressibility. Calf Veins: Not well visualized Superficial Great Saphenous Vein: No evidence of thrombus. Normal compressibility. Venous Reflux:  None. Other Findings:  None. IMPRESSION: Significant occlusive thrombus within the left lower extremity from the popliteal vein to the common femoral vein. These findings would support that seen on recent CT examination. Electronically Signed   By: Alcide Clever M.D.   On: 01/17/2022 02:44   CT Angio Aortobifemoral W and/or Wo Contrast  Result Date: 01/17/2022 CLINICAL DATA:  Lower leg pain and swelling EXAM: CT ANGIOGRAPHY OF ABDOMINAL AORTA WITH  ILIOFEMORAL RUNOFF TECHNIQUE: Multidetector CT imaging of the abdomen, pelvis and lower extremities was performed using the standard protocol during bolus administration of intravenous contrast. Multiplanar CT image reconstructions and MIPs were obtained to evaluate the vascular anatomy. RADIATION DOSE REDUCTION: This exam was performed according to the departmental dose-optimization program which includes automated exposure control, adjustment of the mA and/or kV according to patient size and/or use of iterative reconstruction technique. CONTRAST:  OMNIPAQUE IOHEXOL 350 MG/ML SOLN COMPARISON:  01/05/2022 FINDINGS: VASCULAR Aorta: Normal caliber aorta without aneurysm, dissection, vasculitis or significant stenosis. Celiac: Mild variant anatomy of the celiac axis is noted with the left hepatic artery arising directly from the aorta. The remainder of the celiac axis is within normal limits. SMA: Patent without evidence of aneurysm, dissection, vasculitis or significant stenosis. Renals: Dual renal arteries are noted on the right with single renal artery on the left. No obstructive changes are seen. IMA: Patent without evidence of aneurysm, dissection, vasculitis or significant stenosis. RIGHT Lower Extremity Inflow: Right common and external iliac artery are within normal limits. Common femoral artery is widely patent. Runoff: Superficial femoral artery and popliteal artery are widely patent. Normal popliteal trifurcation is noted with runoff primarily via a diminutive anterior tibial artery. Posterior tibial artery is not visualized beyond the mid calf. LEFT Lower Extremity Inflow: Mild stenosis is noted at the origin of the common iliac artery. The thrombus at extends into the lumen on the prior exam is smaller in size but persistent. This may represent a site for potential distal emboli. The external iliac artery is patent. Common femoral artery is patent as well. Runoff: Profundus femoral artery is patent.  The superficial femoral artery is occluded at its origin. Additionally there is significant stenting in the distal superficial femoral artery extending into the popliteal artery. This stent is occluded throughout its course. Muscular collaterals reconstitute the anterior tibial artery in the proximal calf. The  posterior tibial artery is not visualized. Veins: Mild decreased attenuation is noted in the left iliac venous system. The vein on the left is somewhat larger than that seen on the right. This raises suspicion for deep venous thrombosis given the significant peripheral edema and swelling. Review of the MIP images confirms the above findings. NON-VASCULAR Lower chest: Lung bases are free of acute infiltrate or sizable effusion. Hepatobiliary: No focal liver abnormality is seen. No gallstones, gallbladder wall thickening, or biliary dilatation. Pancreas: Unremarkable. No pancreatic ductal dilatation or surrounding inflammatory changes. Spleen: Normal in size without focal abnormality. Adrenals/Urinary Tract: Adrenal glands are within normal limits. Kidneys demonstrate a normal enhancement pattern bilaterally. Some scarring is noted in the lower pole of the right kidney. Stomach/Bowel: No obstructive or inflammatory changes of the colon are seen. Mild diverticular changes noted without diverticulitis. Small bowel and stomach appear within normal limits. Lymphatic: No lymphadenopathy is noted. Reproductive: Uterus and bilateral adnexa are unremarkable. Other: No abdominal wall hernia or abnormality. No abdominopelvic ascites. Musculoskeletal: There is significant edema involving the left lower extremity and extending into the left buttock some reactive lymphadenopathy is noted in the medial aspect of the left thigh. The left thigh is considerably larger than that the right thigh. These inflammatory changes extend into the calf and to the level of the ankle increased from the prior exam. IMPRESSION: VASCULAR Left  lower extremity: There remains thrombus extending into the lumen of the common iliac artery although the burden is less than that seen on the prior exam. There is now occlusion of the superficial femoral artery on the left at its origin which is progressive when compared with the prior exam. The profundus femoral artery is noted with multiple muscular collaterals reconstituting the anterior tibial artery in the calf. This continues to the level of the ankle. Right lower extremity: Patent superficial femoral and popliteal artery. The popliteal trifurcation is patent as well. The dominant runoff vessel is the diminutive anterior tibial artery. Posterior tibial artery is visualized to the mid calf. No other arterial abnormality is noted. Some mild decreased attenuation in the venous structures of the left lower extremity and left iliac veins is noted. Duplex examination is recommended to evaluate for venous thrombosis. NON-VASCULAR Considerable soft tissue swelling is noted involving the left leg from the level of the buttock to the foot. The leg is considerably increased in size. No muscular necrosis is noted. Mild diverticular change without diverticulitis. No other focal abnormality is noted. Electronically Signed   By: Alcide Clever M.D.   On: 01/17/2022 02:23   DG Chest Portable 1 View  Result Date: 01/17/2022 CLINICAL DATA:  Leg pain swelling short of breath EXAM: PORTABLE CHEST 1 VIEW COMPARISON:  01/05/2022 FINDINGS: The heart size and mediastinal contours are within normal limits. Both lungs are clear. The visualized skeletal structures are unremarkable. IMPRESSION: No active disease. Electronically Signed   By: Jasmine Pang M.D.   On: 01/17/2022 01:51    Review of Systems Blood pressure 133/88, pulse (!) 117, temperature 99 F (37.2 C), temperature source Oral, resp. rate 11, height  (1.651 m), weight 81.6 kg, last menstrual period 01/05/2022, SpO2 100 %. Physical Exam  Assessment/Plan: 1.   Recurrent arterial occlusion, unknown chronicity: She has ischemia, with SFA occlusion beginning at the origin, poor filling all the way down by CTA.  Of note, her runoff previously and her completion angiogram was challenged as noted by Dr. Wyn Quaker.  At this point we will plan on debulking  with aspiration thrombectomy and likely placement of a lytic infusion catheter to improve inflow.  Her leg may or may not be salvageable over an unknown time.  Due to her poor runoff.  I did discuss this with her, she believes that the COVID-vaccine plays a big role here.  2.  Acute iliofemoral DVT: Anticoagulation will be instituted, and in particular on a more aggressive basis if lytic infusion is also performed on the arterial end.  The leg swelling is more consistent with acute iliofemoral DVT being the etiology for swelling.  She currently does not have vigorous inflow exacerbating the situation.  3.  Thrombocytosis with platelets 800: Unclear etiology for this, lysis planned regardless, but this certainly would be exacerbating her prothrombotic state.  4.  With success at restoring flow, may exacerbate all of the above.  Compartment syndrome from acute arterial ischemia a possibility here but hard to tell with swelling from venous outflow obstruction.  Will follow clinically, low threshold for fasciotomy post procedurally.  She understands that her limb is at risk.  We will plan on trying to restore flow here as soon as possible  Arlene Brickel 01/17/2022, 3:10 AM

## 2022-01-17 NOTE — Consult Note (Addendum)
Reason for Consult: Painful swollen left lower extremity, recurrent Referring Physician: Dr. Gerald Leitz Jocelyn [name] is an 41 y.o. female.  HPI: This patient was just discharged 1 week prior after having intervention performed over a 2-day period for left lower extremity ischemia.  This was not her first intervention.  She had 2 prior interventions done in 2021.  She continues to smoke, says she was taking her Eliquis.  Most recent intervention by Dr. Wyn Quaker was mechanical thrombectomy, placement of a lytic catheter, then the following day mechanical thrombectomy once again with multiple areas of ballooning with and without drug-coated balloons.  Previous stents have been placed in 2021.  She says her leg was swollen at the time of discharge.  She says this is gotten worse over the last 2 days, leading to some blistering at the distal medial aspect of the lower extremity.  She said her foot has been cool.  CTA has been done, demonstrates flush occlusion of the left SFA.  Incidentally noted is prominent vein, suggesting DVT corroborated with PVL study.She has extensive left lower extremity DVT as well.  Again, she says that her left leg has been swollen from the time of discharge, unclear the relationship between deep vein thrombosis, and leg swelling in this instance.  Past Medical History:  Diagnosis Date   IDA (iron deficiency anemia) 12/17/2019    Past Surgical History:  Procedure Laterality Date   APPENDECTOMY     LOWER EXTREMITY ANGIOGRAPHY Left 01/11/2020   Procedure: Lower Extremity Angiography;  Surgeon: Annice Needy, MD;  Location: [facility];  Service: Cardiovascular;  Laterality: Left;   LOWER EXTREMITY ANGIOGRAPHY Left 03/06/2020   Procedure: Lower Extremity Angiography;  Surgeon: Annice Needy, MD;  Location: [facility];  Service: Cardiovascular;  Laterality: Left;   LOWER EXTREMITY ANGIOGRAPHY Left 03/07/2020   Procedure: Lower Extremity Angiography;  Surgeon: Annice Needy, MD;  Location: [facility];  Service: Cardiovascular;  Laterality: Left;   LOWER EXTREMITY ANGIOGRAPHY Left 01/06/2022   Procedure: Lower Extremity Angiography;  Surgeon: Annice Needy, MD;  Location: [facility];  Service: Cardiovascular;  Laterality: Left;   LOWER EXTREMITY ANGIOGRAPHY Left 01/07/2022   Procedure: Lower Extremity Angiography;  Surgeon: Annice Needy, MD;  Location: [facility];  Service: Cardiovascular;  Laterality: Left;    Family History  Problem Relation Age of Onset   Hypertension Mother    Hypertension Sister    Hypertension Brother     Social History:  reports that she has been smoking cigarettes. She has been smoking an average of .5 packs per day. She has never used smokeless tobacco. She reports current alcohol use. She reports that she does not use drugs.  Allergies: No Known Allergies  Medications: I have reviewed the patient's current medications. Prior to Admission: (Not in a hospital admission)   Results for orders placed or performed during the hospital encounter of 01/17/22 (from the past 48 hour(s))  CBC with Differential/Platelet     Status: Abnormal   Collection Time: 01/17/22  1:16 AM  Result Value Ref Range   WBC 13.3 (H) 4.0 - 10.5 K/uL   RBC 3.75 (L) 3.87 - 5.11 MIL/uL   Hemoglobin 7.4 (L) 12.0 - 15.0 g/dL    Comment: Reticulocyte Hemoglobin testing may be clinically indicated, consider ordering this additional test [id number]    HCT 27.6 (L) 36.0 - 46.0 %   MCV 73.6 (L) 80.0 - 100.0 fL  MCH 19.7 (L) 26.0 - 34.0 pg   MCHC 26.8 (L) 30.0 - 36.0 g/dL   RDW 72.5 (H) 36.6 - 44.0 %   Platelets 800 (H) 150 - 400 K/uL   nRBC 0.2 0.0 - 0.2 %   Neutrophils Relative % 76 %   Neutro Abs 10.2 (H) 1.7 - 7.7 K/uL   Lymphocytes Relative 14 %   Lymphs Abs 1.8 0.7 - 4.0 K/uL   Monocytes Relative 6 %   Monocytes Absolute 0.8 0.1 - 1.0 K/uL   Eosinophils Relative 2 %   Eosinophils Absolute 0.2 0.0 - 0.5 K/uL    Basophils Relative 1 %   Basophils Absolute 0.1 0.0 - 0.1 K/uL   Immature Granulocytes 1 %   Abs Immature Granulocytes 0.15 (H) 0.00 - 0.07 K/uL    Comment: Performed at [facility], [address], [place], [place] [address]  Basic metabolic panel     Status: Abnormal   Collection Time: 01/17/22  1:16 AM  Result Value Ref Range   Sodium 135 135 - 145 mmol/L   Potassium 3.8 3.5 - 5.1 mmol/L   Chloride 103 98 - 111 mmol/L   CO2 22 22 - 32 mmol/L   Glucose, Bld 118 (H) 70 - 99 mg/dL    Comment: Glucose reference range applies only to samples taken after fasting for at least 8 hours.   BUN 8 6 - 20 mg/dL   Creatinine, Ser 5.95 0.44 - 1.00 mg/dL   Calcium 9.4 8.9 - 63.8 mg/dL   GFR, Estimated >75 >64 mL/min    Comment: (NOTE) Calculated using the CKD-EPI Creatinine Equation (2021)    Anion gap 10 5 - 15    Comment: Performed at [facility], [address] [place], [place] [address]  Protime-INR     Status: None   Collection Time: 01/17/22  1:16 AM  Result Value Ref Range   Prothrombin Time 14.7 11.4 - 15.2 seconds   INR 1.2 0.8 - 1.2    Comment: (NOTE) INR goal varies based on device and disease states. Performed at [facility], [address] [place], [place] [address]   Brain natriuretic peptide     Status: None   Collection Time: 01/17/22  1:16 AM  Result Value Ref Range   B Natriuretic Peptide 59.2 0.0 - 100.0 pg/mL    Comment: Performed at [facility], [address] [place], [place] [address]  Troponin I (High Sensitivity)     Status: None   Collection Time: 01/17/22  1:16 AM  Result Value Ref Range   Troponin I (High Sensitivity) 7 <18 ng/L    Comment: (NOTE) Elevated high sensitivity troponin I (hsTnI) values and significant  changes across serial measurements may suggest ACS but many other  chronic and acute conditions are known to elevate hsTnI results.  Refer to the "Links" section for chest pain algorithms and  additional  guidance. Performed at [facility], [address] [place], [place] [address]   hCG, quantitative, pregnancy     Status: None   Collection Time: 01/17/22  1:16 AM  Result Value Ref Range   hCG, Beta Chain, Quant, S 1 <5 mIU/mL    Comment:          GEST. AGE      CONC.  (mIU/mL)   <=1 WEEK        5 - 50     2 WEEKS       50 -  500     3 WEEKS       100 - 10,000     4 WEEKS     1,000 - 30,000     5 WEEKS     3,500 - 115,000   6-8 WEEKS     12,000 - 270,000    12 WEEKS     15,000 - 220,000        FEMALE AND NON-PREGNANT FEMALE:     LESS THAN 5 mIU/mL Performed at [facility], [address] [place], [place] [address]   Procalcitonin - Baseline     Status: None   Collection Time: 01/17/22  1:16 AM  Result Value Ref Range   Procalcitonin 0.11 ng/mL    Comment:        Interpretation: PCT (Procalcitonin) <= 0.5 ng/mL: Systemic infection (sepsis) is not likely. Local bacterial infection is possible. (NOTE)       Sepsis PCT Algorithm           Lower Respiratory Tract                                      Infection PCT Algorithm    ----------------------------     ----------------------------         PCT < 0.25 ng/mL                PCT < 0.10 ng/mL          Strongly encourage             Strongly discourage   discontinuation of antibiotics    initiation of antibiotics    ----------------------------     -----------------------------       PCT 0.25 - 0.50 ng/mL            PCT 0.10 - 0.25 ng/mL               OR       >80% decrease in PCT            Discourage initiation of                                            antibiotics      Encourage discontinuation           of antibiotics    ----------------------------     -----------------------------         PCT >= 0.50 ng/mL              PCT 0.26 - 0.50 ng/mL               AND        <80% decrease in PCT             Encourage initiation of                                             antibiotics        Encourage continuation           of antibiotics    ----------------------------     -----------------------------        PCT >= 0.50 ng/mL  PCT > 0.50 ng/mL               AND         increase in PCT                  Strongly encourage                                      initiation of antibiotics    Strongly encourage escalation           of antibiotics                                     -----------------------------                                           PCT <= 0.25 ng/mL                                                 OR                                        > 80% decrease in PCT                                      Discontinue / Do not initiate                                             antibiotics  Performed at [facility], [address] [place], [place] [address]   APTT     Status: None   Collection Time: 01/17/22  1:16 AM  Result Value Ref Range   aPTT 30 24 - 36 seconds    Comment: Performed at [facility], [address] [place], [place] [address]  Heparin level (unfractionated)     Status: Abnormal   Collection Time: 01/17/22  1:16 AM  Result Value Ref Range   Heparin Unfractionated >1.10 (H) 0.30 - 0.70 IU/mL    Comment: (NOTE) The clinical reportable range upper limit is being lowered to >1.10 to align with the FDA approved guidance for the current laboratory assay.  If heparin results are below expected values, and patient dosage has  been confirmed, suggest follow up testing of antithrombin III levels. Performed at [facility], [address] [place], [place] [address]   Type and screen [facility]     Status: None (Preliminary result)   Collection Time: 01/17/22  2:12 AM  Result Value Ref Range   ABO/RH(D) PENDING    Antibody Screen PENDING    Sample Expiration      01/20/2022,2359 Performed at [facility], [address] [place], [place] [address]     US  Venous Img Lower Unilateral Left  Result Date: 01/17/2022 CLINICAL DATA:  Left leg pain and swelling EXAM: LEFT LOWER EXTREMITY VENOUS DOPPLER ULTRASOUND TECHNIQUE: Gray-scale sonography with graded compression, as well as color Doppler and duplex ultrasound were performed to evaluate the lower extremity deep venous systems from the level of the common femoral vein and including the common femoral, femoral, profunda femoral, popliteal and calf veins including the posterior tibial, peroneal and gastrocnemius veins when visible. The superficial great saphenous vein was also interrogated. Spectral Doppler was utilized to evaluate flow at rest and with distal augmentation maneuvers in the common femoral, femoral and popliteal veins. COMPARISON:  None Available. FINDINGS: Contralateral Common Femoral Vein: Respiratory phasicity is normal and symmetric with the symptomatic side. No evidence of thrombus. Normal compressibility. Common Femoral Vein: Occlusive thrombus is noted with decreased compressibility and occlusive nature. Saphenofemoral Junction: Occlusive thrombus is noted with decreased compressibility Profunda Femoral Vein: Occlusive thrombus is noted with decreased compressibility. Femoral Vein: Occlusive thrombus is noted with decreased compressibility. Popliteal Vein: Occlusive thrombus is noted with decreased compressibility. Calf Veins: Not well visualized Superficial Great Saphenous Vein: No evidence of thrombus. Normal compressibility. Venous Reflux:  None. Other Findings:  None. IMPRESSION: Significant occlusive thrombus within the left lower extremity from the popliteal vein to the common femoral vein. These findings would support that seen on recent CT examination. Electronically Signed   By: Alcide Clever M.D.   On: 01/17/2022 02:44   CT Angio Aortobifemoral W and/or Wo Contrast  Result Date: 01/17/2022 CLINICAL DATA:  Lower leg pain and swelling EXAM: CT ANGIOGRAPHY OF ABDOMINAL AORTA WITH  ILIOFEMORAL RUNOFF TECHNIQUE: Multidetector CT imaging of the abdomen, pelvis and lower extremities was performed using the standard protocol during bolus administration of intravenous contrast. Multiplanar CT image reconstructions and MIPs were obtained to evaluate the vascular anatomy. RADIATION DOSE REDUCTION: This exam was performed according to the departmental dose-optimization program which includes automated exposure control, adjustment of the mA and/or kV according to patient size and/or use of iterative reconstruction technique. CONTRAST:  OMNIPAQUE IOHEXOL 350 MG/ML SOLN COMPARISON:  01/05/2022 FINDINGS: VASCULAR Aorta: Normal caliber aorta without aneurysm, dissection, vasculitis or significant stenosis. Celiac: Mild variant anatomy of the celiac axis is noted with the left hepatic artery arising directly from the aorta. The remainder of the celiac axis is within normal limits. SMA: Patent without evidence of aneurysm, dissection, vasculitis or significant stenosis. Renals: Dual renal arteries are noted on the right with single renal artery on the left. No obstructive changes are seen. IMA: Patent without evidence of aneurysm, dissection, vasculitis or significant stenosis. RIGHT Lower Extremity Inflow: Right common and external iliac artery are within normal limits. Common femoral artery is widely patent. Runoff: Superficial femoral artery and popliteal artery are widely patent. Normal popliteal trifurcation is noted with runoff primarily via a diminutive anterior tibial artery. Posterior tibial artery is not visualized beyond the mid calf. LEFT Lower Extremity Inflow: Mild stenosis is noted at the origin of the common iliac artery. The thrombus at extends into the lumen on the prior exam is smaller in size but persistent. This may represent a site for potential distal emboli. The external iliac artery is patent. Common femoral artery is patent as well. Runoff: Profundus femoral artery is patent.  The superficial femoral artery is occluded at its origin. Additionally there is significant stenting in the distal superficial femoral artery extending into the popliteal artery. This stent is occluded throughout its course. Muscular collaterals reconstitute the anterior tibial artery in the proximal calf. The  posterior tibial artery is not visualized. Veins: Mild decreased attenuation is noted in the left iliac venous system. The vein on the left is somewhat larger than that seen on the right. This raises suspicion for deep venous thrombosis given the significant peripheral edema and swelling. Review of the MIP images confirms the above findings. NON-VASCULAR Lower chest: Lung bases are free of acute infiltrate or sizable effusion. Hepatobiliary: No focal liver abnormality is seen. No gallstones, gallbladder wall thickening, or biliary dilatation. Pancreas: Unremarkable. No pancreatic ductal dilatation or surrounding inflammatory changes. Spleen: Normal in size without focal abnormality. Adrenals/Urinary Tract: Adrenal glands are within normal limits. Kidneys demonstrate a normal enhancement pattern bilaterally. Some scarring is noted in the lower pole of the right kidney. Stomach/Bowel: No obstructive or inflammatory changes of the colon are seen. Mild diverticular changes noted without diverticulitis. Small bowel and stomach appear within normal limits. Lymphatic: No lymphadenopathy is noted. Reproductive: Uterus and bilateral adnexa are unremarkable. Other: No abdominal wall hernia or abnormality. No abdominopelvic ascites. Musculoskeletal: There is significant edema involving the left lower extremity and extending into the left buttock some reactive lymphadenopathy is noted in the medial aspect of the left thigh. The left thigh is considerably larger than that the right thigh. These inflammatory changes extend into the calf and to the level of the ankle increased from the prior exam. IMPRESSION: VASCULAR Left  lower extremity: There remains thrombus extending into the lumen of the common iliac artery although the burden is less than that seen on the prior exam. There is now occlusion of the superficial femoral artery on the left at its origin which is progressive when compared with the prior exam. The profundus femoral artery is noted with multiple muscular collaterals reconstituting the anterior tibial artery in the calf. This continues to the level of the ankle. Right lower extremity: Patent superficial femoral and popliteal artery. The popliteal trifurcation is patent as well. The dominant runoff vessel is the diminutive anterior tibial artery. Posterior tibial artery is visualized to the mid calf. No other arterial abnormality is noted. Some mild decreased attenuation in the venous structures of the left lower extremity and left iliac veins is noted. Duplex examination is recommended to evaluate for venous thrombosis. NON-VASCULAR Considerable soft tissue swelling is noted involving the left leg from the level of the buttock to the foot. The leg is considerably increased in size. No muscular necrosis is noted. Mild diverticular change without diverticulitis. No other focal abnormality is noted. Electronically Signed   By: Alcide Clever M.D.   On: 01/17/2022 02:23   DG Chest Portable 1 View  Result Date: 01/17/2022 CLINICAL DATA:  Leg pain swelling short of breath EXAM: PORTABLE CHEST 1 VIEW COMPARISON:  01/05/2022 FINDINGS: The heart size and mediastinal contours are within normal limits. Both lungs are clear. The visualized skeletal structures are unremarkable. IMPRESSION: No active disease. Electronically Signed   By: Jasmine Pang M.D.   On: 01/17/2022 01:51    Review of Systems Blood pressure 133/88, pulse (!) 117, temperature 99 F (37.2 C), temperature source Oral, resp. rate 11, height  (1.651 m), weight 81.6 kg, last menstrual period 01/05/2022, SpO2 100 %. Physical Exam  Assessment/Plan: 1.   Recurrent arterial occlusion, unknown chronicity: She has ischemia, with SFA occlusion beginning at the origin, poor filling all the way down by CTA.  Of note, her runoff previously and her completion angiogram was challenged as noted by Dr. Wyn Quaker.  At this point we will plan on debulking  with aspiration thrombectomy and likely placement of a lytic infusion catheter to improve inflow.  Her leg may or may not be salvageable over an unknown time.  Due to her poor runoff.  I did discuss this with her, she believes that the COVID-vaccine plays a big role here.  2.  Acute iliofemoral DVT: Anticoagulation will be instituted, and in particular on a more aggressive basis if lytic infusion is also performed on the arterial end.  The leg swelling is more consistent with acute iliofemoral DVT being the etiology for swelling.  She currently does not have vigorous inflow exacerbating the situation.  3.  Thrombocytosis with platelets 800: Unclear etiology for this, lysis planned regardless, but this certainly would be exacerbating her prothrombotic state.  4.  With success at restoring flow, may exacerbate all of the above.  Compartment syndrome from acute arterial ischemia a possibility here but hard to tell with swelling from venous outflow obstruction.  Will follow clinically, low threshold for fasciotomy post procedurally.  She understands that her limb is at risk.  We will plan on trying to restore flow here as soon as possible  Donette Mainwaring 01/17/2022, 3:10 AM

## 2022-01-17 NOTE — Progress Notes (Addendum)
ANTICOAGULATION CONSULT NOTE - Initial Consult  Pharmacy Consult for Heparin Indication: DVT and limb ischemia  No Known Allergies  Patient Measurements: Height: 5\' 5"  (165.1 cm) Weight: 81.6 kg (180 lb) IBW/kg (Calculated) : 57 Heparin Dosing Weight: 74.4 kg   Vital Signs: Pulse Rate: 114 (07/09 0116)  Labs: Recent Labs    01/17/22 0116  HGB 7.4*  HCT 27.6*  PLT 800*  APTT 30  HEPARINUNFRC >1.10*    Estimated Creatinine Clearance: 35 mL/min (A) (by C-G formula based on SCr of 2.21 mg/dL (H)).   Medical History: Past Medical History:  Diagnosis Date   IDA (iron deficiency anemia) 12/17/2019    Medications:  (Not in a hospital admission)   Assessment: Pharmacy consulted to dose heparin for DVT/limb ischemia in this 41 year old female with hx of PAD, multiple DVT's, stent placement.  Pt underwent thrombectomy and revascularization of LLE on 6/29 and was started on Eliquis 5 mg PO BID on 6/30. Pt presents to ED via EMS on 7/8 with complaints of LLE pain and swelling.   Last dose of Eliquis on 7/8 @ 1800.   Goal of Therapy:  Heparin level 0.3-0.7 units/ml aPTT 66 - 102 seconds Monitor platelets by anticoagulation protocol: Yes   Plan:  Will order baseline aPTT and HL. Last dose of Eliquis 5 mg on 7/8 @ 1800. Ordinarily would wait until next dose of Eliquis due to start heparin; however, due to urgent nature of ischemia, Dr 9/8 wants to start heparin drip ASAP.   Will start heparin drip on 7/9 @ 0230 at 1200 units/hr.  Will use aPTT to guide dosing until HL and aPTT correlate.  Will check CBC daily.   Oberon Hehir D 01/17/2022,1:52 AM

## 2022-01-17 NOTE — Progress Notes (Incomplete)
{  Select_TRH_Note:[id number]} 

## 2022-01-17 NOTE — Assessment & Plan Note (Addendum)
   S/p LLE AKA

## 2022-01-17 NOTE — H&P (Addendum)
History and Physical    Patient: Jocelyn [name] TLX:[id number] DOB: Jun 11, 1981 DOA: 01/17/2022 DOS: the patient was seen and examined on 01/17/2022 PCP: [facility]  Patient coming from: Home  Chief Complaint:  Chief Complaint  Patient presents with   Leg Swelling   HPI: Jocelyn [name] is a 41 y.o. female with medical history significant of DM, HTN, tobacco use disorder, iron deficiency anemia, peripheral arterial disease on Eliquis with history of left lower extremity stent placement in July 2021 complicated by in-stent thrombosis requiring thrombolytic therapy and more recently on 01/07/2022 undergoing mechanical thrombectomy of the left SFA with angioplasty who presents to the ED with a 2-day history of severe pain of the left leg associated with the appearance of blisters on the leg,.  She also has shortness of breath starting today but without chest pain.  She has been compliant with her Eliquis. ED course and data review: Tachycardic to 1 17-1 22 in the ED with BP 149/86.  Temp 99. Labs WBC 13,000, hemoglobin 7.4 which is her baseline platelets 800.  BMP unremarkable.  Troponin 7 and BNP 59 EKG, personally viewed and interpreted: Sinus tachycardia at 117 with no acute ST-T wave changes Imaging significant for occlusion of the superficial femoral artery on the left among other findings lower extremity Doppler showed occlusive thrombus in the left lower extremity from the popliteal to the common femoral vein  Patient was started on a heparin infusion.  The ED provider spoke with vascular surgeon Dr. Renne Crigler who evaluated patient in the ED and will take patient emergently to the OR.  Hospitalist consulted for admission.   Past Medical History:  Diagnosis Date   IDA (iron deficiency anemia) 12/17/2019   Past Surgical History:  Procedure Laterality Date   APPENDECTOMY     LOWER EXTREMITY ANGIOGRAPHY Left 01/11/2020   Procedure: Lower Extremity Angiography;  Surgeon: Annice Needy, MD;  Location: [facility] INVASIVE CV LAB;  Service: Cardiovascular;  Laterality: Left;   LOWER EXTREMITY ANGIOGRAPHY Left 03/06/2020   Procedure: Lower Extremity Angiography;  Surgeon: Annice Needy, MD;  Location: [facility] INVASIVE CV LAB;  Service: Cardiovascular;  Laterality: Left;   LOWER EXTREMITY ANGIOGRAPHY Left 03/07/2020   Procedure: Lower Extremity Angiography;  Surgeon: Annice Needy, MD;  Location: [facility] INVASIVE CV LAB;  Service: Cardiovascular;  Laterality: Left;   LOWER EXTREMITY ANGIOGRAPHY Left 01/06/2022   Procedure: Lower Extremity Angiography;  Surgeon: Annice Needy, MD;  Location: [facility] INVASIVE CV LAB;  Service: Cardiovascular;  Laterality: Left;   LOWER EXTREMITY ANGIOGRAPHY Left 01/07/2022   Procedure: Lower Extremity Angiography;  Surgeon: Annice Needy, MD;  Location: [facility] INVASIVE CV LAB;  Service: Cardiovascular;  Laterality: Left;   Social History:  reports that she has been smoking cigarettes. She has been smoking an average of .5 packs per day. She has never used smokeless tobacco. She reports current alcohol use. She reports that she does not use drugs.  No Known Allergies  Family History  Problem Relation Age of Onset   Hypertension Mother    Hypertension Sister    Hypertension Brother     Prior to Admission medications   Medication Sig Start Date End Date Taking? Authorizing Provider  amLODipine (NORVASC) 5 MG tablet Take 1 tablet (5 mg total) by mouth daily. 01/09/22  Yes Arnetha Courser, MD  apixaban (ELIQUIS) 5 MG TABS tablet Take 1 tablet (5 mg total) by mouth 2 (two) times daily. 01/11/22  Yes Arnetha Courser, MD  docusate sodium (COLACE) 100 MG capsule Take 1 capsule (100 mg total) by mouth 2 (two) times daily as needed for mild constipation. 01/09/22  Yes Arnetha CourserAmin, Sumayya, MD  Ferrous Sulfate (IRON) 325 (65 Fe) MG TABS Take 1 tablet (325 mg total) by mouth daily. 12/17/19 01/17/22 Yes Sharman CheekStafford, Phillip, MD  gabapentin (NEURONTIN) 300 MG capsule Take 1 capsule (300 mg total)  by mouth 3 (three) times daily. 01/09/22  Yes Arnetha CourserAmin, Sumayya, MD  polyethylene glycol (MIRALAX / GLYCOLAX) 17 g packet Take 17 g by mouth daily as needed for moderate constipation. 01/09/22  Yes Arnetha CourserAmin, Sumayya, MD  nicotine (NICODERM CQ - DOSED IN MG/24 HOURS) 21 mg/24hr patch Place 1 patch (21 mg total) onto the skin daily. Patient not taking: Reported on 01/17/2022 01/12/20   Enedina FinnerPatel, Sona, MD    Physical Exam: Vitals:   01/17/22 0156 01/17/22 0200 01/17/22 0300 01/17/22 0330  BP: (!) 149/86 (!) 157/102 133/88 (!) 144/87  Pulse: (!) 116 (!) 123 (!) 117 (!) 110  Resp: 17 16 11 18   Temp: 99 F (37.2 C)     TempSrc: Oral     SpO2: 96% 96% 100% 98%  Weight:      Height:      CONSTITUTIONAL: Alert and oriented and responds appropriately to questions.  HEAD: Normocephalic, atraumatic EYES: Conjunctivae clear, pupils appear equal, sclera nonicteric ENT: normal nose; moist mucous membranes NECK: Supple, normal ROM CARD: Regular and tachycardic; S1 and S2 appreciated; no murmurs, no clicks, no rubs, no gallops RESP: Patient is tachypneic, breath sounds clear and equal bilaterally; no wheezes, no rhonchi, no rales, no hypoxia or respiratory distress, speaking full sentences ABD/GI: Normal bowel sounds; non-distended; soft, non-tender, EXT: swelling of the left lower extremity to the proximal thigh with induration but no redness, heat noted.  The left foot is cooler to touch than the right.  pain with any palpation of the left leg.   SKIN: Normal color for age and race; warm; no rash on exposed skin NEURO: Moves all extremities equally, normal speech  Data Reviewed: Data Reviewed: Relevant notes from primary care and specialist visits, past discharge summaries as available in EHR, including Care Everywhere. Prior diagnostic testing as pertinent to current admission diagnoses Updated medications and problem lists for reconciliation ED course, including vitals, labs, imaging, treatment and response to  treatment Triage notes, nursing and pharmacy notes and ED provider's notes Notable results as noted in HPI   Results for orders placed or performed during the hospital encounter of 01/17/22 (from the past 48 hour(s))  CBC with Differential/Platelet     Status: Abnormal   Collection Time: 01/17/22  1:16 AM  Result Value Ref Range   WBC 13.3 (H) 4.0 - 10.5 K/uL   RBC 3.75 (L) 3.87 - 5.11 MIL/uL   Hemoglobin 7.4 (L) 12.0 - 15.0 g/dL    Comment: Reticulocyte Hemoglobin testing may be clinically indicated, consider ordering this additional test [id number]    HCT 27.6 (L) 36.0 - 46.0 %   MCV 73.6 (L) 80.0 - 100.0 fL   MCH 19.7 (L) 26.0 - 34.0 pg   MCHC 26.8 (L) 30.0 - 36.0 g/dL   RDW 54.028.2 (H) 98.111.5 - 19.115.5 %   Platelets 800 (H) 150 - 400 K/uL   nRBC 0.2 0.0 - 0.2 %   Neutrophils Relative % 76 %   Neutro Abs 10.2 (H) 1.7 - 7.7 K/uL   Lymphocytes Relative 14 %   Lymphs Abs 1.8 0.7 - 4.0 K/uL  Monocytes Relative 6 %   Monocytes Absolute 0.8 0.1 - 1.0 K/uL   Eosinophils Relative 2 %   Eosinophils Absolute 0.2 0.0 - 0.5 K/uL   Basophils Relative 1 %   Basophils Absolute 0.1 0.0 - 0.1 K/uL   Immature Granulocytes 1 %   Abs Immature Granulocytes 0.15 (H) 0.00 - 0.07 K/uL    Comment: Performed at [facility], [address], [place], [place] [address]  Basic metabolic panel     Status: Abnormal   Collection Time: 01/17/22  1:16 AM  Result Value Ref Range   Sodium 135 135 - 145 mmol/L   Potassium 3.8 3.5 - 5.1 mmol/L   Chloride 103 98 - 111 mmol/L   CO2 22 22 - 32 mmol/L   Glucose, Bld 118 (H) 70 - 99 mg/dL    Comment: Glucose reference range applies only to samples taken after fasting for at least 8 hours.   BUN 8 6 - 20 mg/dL   Creatinine, Ser 9.14 0.44 - 1.00 mg/dL   Calcium 9.4 8.9 - 78.2 mg/dL   GFR, Estimated >95 >62 mL/min    Comment: (NOTE) Calculated using the CKD-EPI Creatinine Equation (2021)    Anion gap 10 5 - 15    Comment: Performed at [facility], [address] [place], [place] [address]  Protime-INR     Status: None   Collection Time: 01/17/22  1:16 AM  Result Value Ref Range   Prothrombin Time 14.7 11.4 - 15.2 seconds   INR 1.2 0.8 - 1.2    Comment: (NOTE) INR goal varies based on device and disease states. Performed at [facility], [address] [place], [place] [address]   Brain natriuretic peptide     Status: None   Collection Time: 01/17/22  1:16 AM  Result Value Ref Range   B Natriuretic Peptide 59.2 0.0 - 100.0 pg/mL    Comment: Performed at [facility], [address] [place], [place] [address]  Troponin I (High Sensitivity)     Status: None   Collection Time: 01/17/22  1:16 AM  Result Value Ref Range   Troponin I (High Sensitivity) 7 <18 ng/L    Comment: (NOTE) Elevated high sensitivity troponin I (hsTnI) values and significant  changes across serial measurements may suggest ACS but many other  chronic and acute conditions are known to elevate hsTnI results.  Refer to the "Links" section for chest pain algorithms and additional  guidance. Performed at [facility], [address] [place], [place] [address]   hCG, quantitative, pregnancy     Status: None   Collection Time: 01/17/22  1:16 AM  Result Value Ref Range   hCG, Beta Chain, Quant, S 1 <5 mIU/mL    Comment:          GEST. AGE      CONC.  (mIU/mL)   <=1 WEEK        5 - 50     2 WEEKS       50 - 500     3 WEEKS       100 - 10,000     4 WEEKS     1,000 - 30,000     5 WEEKS     3,500 - 115,000   6-8 WEEKS     12,000 - 270,000    12 WEEKS     15,000 - 220,000        FEMALE AND NON-PREGNANT FEMALE:  LESS THAN 5 mIU/mL Performed at [facility], [address] [place], [place] [address]   Procalcitonin - Baseline     Status: None   Collection Time: 01/17/22  1:16 AM  Result Value Ref Range   Procalcitonin 0.11 ng/mL    Comment:        Interpretation: PCT (Procalcitonin) <= 0.5  ng/mL: Systemic infection (sepsis) is not likely. Local bacterial infection is possible. (NOTE)       Sepsis PCT Algorithm           Lower Respiratory Tract                                      Infection PCT Algorithm    ----------------------------     ----------------------------         PCT < 0.25 ng/mL                PCT < 0.10 ng/mL          Strongly encourage             Strongly discourage   discontinuation of antibiotics    initiation of antibiotics    ----------------------------     -----------------------------       PCT 0.25 - 0.50 ng/mL            PCT 0.10 - 0.25 ng/mL               OR       >80% decrease in PCT            Discourage initiation of                                            antibiotics      Encourage discontinuation           of antibiotics    ----------------------------     -----------------------------         PCT >= 0.50 ng/mL              PCT 0.26 - 0.50 ng/mL               AND        <80% decrease in PCT             Encourage initiation of                                             antibiotics       Encourage continuation           of antibiotics    ----------------------------     -----------------------------        PCT >= 0.50 ng/mL                  PCT > 0.50 ng/mL               AND         increase in PCT                  Strongly encourage  initiation of antibiotics    Strongly encourage escalation           of antibiotics                                     -----------------------------                                           PCT <= 0.25 ng/mL                                                 OR                                        > 80% decrease in PCT                                      Discontinue / Do not initiate                                             antibiotics  Performed at [facility], [address] [place], [place] [address]   APTT     Status: None   Collection  Time: 01/17/22  1:16 AM  Result Value Ref Range   aPTT 30 24 - 36 seconds    Comment: Performed at [facility], [address] [place], [place] [address]  Heparin level (unfractionated)     Status: Abnormal   Collection Time: 01/17/22  1:16 AM  Result Value Ref Range   Heparin Unfractionated >1.10 (H) 0.30 - 0.70 IU/mL    Comment: (NOTE) The clinical reportable range upper limit is being lowered to >1.10 to align with the FDA approved guidance for the current laboratory assay.  If heparin results are below expected values, and patient dosage has  been confirmed, suggest follow up testing of antithrombin III levels. Performed at [facility], [address] [place], [place] [address]   Type and screen [facility]     Status: None   Collection Time: 01/17/22  2:12 AM  Result Value Ref Range   ABO/RH(D) B POS    Antibody Screen NEG    Sample Expiration      01/20/2022,2359 Performed at [facility], [address] [place], [place] [address]   Troponin I (High Sensitivity)     Status: None   Collection Time: 01/17/22  4:01 AM  Result Value Ref Range   Troponin I (High Sensitivity) 7 <18 ng/L    Comment: (NOTE) Elevated high sensitivity troponin I (hsTnI) values and significant  changes across serial measurements may suggest ACS but many other  chronic and acute conditions are known to elevate hsTnI results.  Refer to the "Links" section for chest pain algorithms and additional  guidance. Performed at [facility], [address], [place], [place] [address]  Assessment and Plan: * Critical limb ischemia of left lower extremity (HCC) S/p thrombectomy left SFA 01/07/2022 Patient being taken emergently to the OR by vascular at the time of this dictation She is currently on a heparin infusion Patient is s/p angioplasty and thrombectomy on 01/07/2022 and was compliant with her Eliquis  Acute deep vein  thrombosis (DVT) of left femoral vein (HCC) Continue heparin infusion with plans to transition to oral anticoagulants when appropriate Patient was compliant with her oral Eliquis  IDA (iron deficiency anemia) Hemoglobin stable at 7.4  HTN (hypertension) Can resume home amlodipine once cleared to eat postoperatively  Diabetes mellitus without complication (HCC) Sliding scale insulin coverage      Advance Care Planning:   Code Status: Prior   Consults: Vascular, Dr. Renne Crigler  Family Communication:   Severity of Illness: The appropriate patient status for this patient is INPATIENT. Inpatient status is judged to be reasonable and necessary in order to provide the required intensity of service to ensure the patient's safety. The patient's presenting symptoms, physical exam findings, and initial radiographic and laboratory data in the context of their chronic comorbidities is felt to place them at high risk for further clinical deterioration. Furthermore, it is not anticipated that the patient will be medically stable for discharge from the hospital within 2 midnights of admission.   * I certify that at the point of admission it is my clinical judgment that the patient will require inpatient hospital care spanning beyond 2 midnights from the point of admission due to high intensity of service, high risk for further deterioration and high frequency of surveillance required.*  Author: Andris Baumann, MD 01/17/2022 4:31 AM  For on call review [email or web address].

## 2022-01-17 NOTE — Op Note (Signed)
Jocelyn [name]  Percutaneous Study/Intervention Procedural Note   Date of Surgery: 01/17/2022  Surgeon(s):Jocelyn [name]  Assistants:none  Pre-operative Diagnosis: 1.  PAD with recurrent left leg arterial occlusion from the SFA origin down 2.  Extensive left lower extremity DVT with subcu swelling  Post-operative diagnosis:  Same  Procedure(s) Performed:             1.  Ultrasound guidance for vascular access right femoral artery             2.  Catheter placement into left posterior tibial artery             3.  Aortogram and selective left lower extremity angiogram             4.  Percutaneous transluminal angioplasty of left SFA 4 mm             5.  Percutaneous transluminal angioplasty left posterior tibial artery 3 mm  6.  Attempted penumbra thrombectomy of left SFA             7.  Placement of EKOS lytic infusion catheter left posterior tibial artery up through the SFA  8.  Placement of triple-lumen catheter right common femoral vein with ultrasound guidance  EBL: 10  Contrast: 55 cc  Fluoro Time: 7.1 minutes  Moderate Conscious Sedation Time: approximately 67 minutes minutes using 7 mg of Versed and 200 mcg of Fentanyl              Indications:  Patient is a 41 y.o.female with recurrent acutely ischemic left lower extremity, this time with concurrent iliofemoral DVT. The patient has noninvasive study showing occluded venous system, CTA shows occlusion of the just recently 1 week ago treated left lower extremity arterial segment with occlusion at the SFA origin on down. The patient is brought in for angiography emergently for further evaluation and potential treatment.  Due to the limb threatening nature of the situation, angiogram was performed for attempted limb salvage. The patient is aware that if the procedure fails, amputation would be expected.  The patient also understands that even with successful revascularization, amputation may still be required  due to the severity of the situation.  Adding to the morbidity of the situation is the concurrent DVT, which will be addressed with anticoagulation, as she has not had flow to her leg now for an undetermined number of days with frank ischemia.  Her leg is warm, edematous consistent with venous outflow obstruction, less consistent with compartment syndrome.  Risks and benefits are discussed and informed consent is obtained.   Procedure:  The patient was identified and appropriate procedural time out was performed.  The patient was then placed supine on the table and prepped and draped in the usual sterile fashion. Moderate conscious sedation was administered during a face to face encounter with the patient throughout the procedure with my supervision of the RN administering medicines and monitoring the patient's vital signs, pulse oximetry, telemetry and mental status throughout from the start of the procedure until the patient was taken to the recovery room. Ultrasound was used to evaluate the right common femoral artery.  It was patent .  A digital ultrasound image was acquired.  A Seldinger needle was used to access the right common femoral artery under direct ultrasound guidance and a permanent image was performed.  A 0.035 J wire was advanced without resistance and a 5Fr sheath was placed.  Pigtail catheter was placed into the aorta and  an terminal AP aortogram was performed. This demonstrated normal terminal aorta and iliac segments without significant stenosis. I then crossed the aortic bifurcation and advanced to the left femoral head. Selective left lower extremity angiogram was then performed. This demonstrated occlusion just beyond the SFA origin without reconstitution of the SFA, popliteal, or tibials of any significant extent.   It was felt that it was in the patient's best interest to proceed with intervention after these images to avoid a second procedure and a larger amount of contrast and  fluoroscopy based off of the findings from the initial angiogram. The patient was systemically heparinized in the emergency room, and an additional 4000 units of heparin was given, and a 6 [place] Ansell sheath was then placed over the [employer] wire. I then used a Kumpe catheter and the advantage wire to access the left lower extremity SFA.  Combination of Glidewire advantage and 5 [place] seeker catheter were used to traverse the occlusion of the left SFA popliteal distribution into the posterior tibial location.  Exchange was made for a V18 control wire.  CAT 6 device was then prepped for use and advanced.  Aspiration thrombectomy was attempted, there appeared to be very little withdrawn down from the proximal extent of the SFA to the mid segment.  This was withdrawn, and a long 4 mm balloon was employed for the SFA and popliteal distributions, and a 3 mm balloon used for the posterior tibial.  Follow-up run demonstrated very poor flow once again.  This being the case and UniFuse catheter was subsequently advanced, 50 cm treatment segment, 135 cm in length.  This was parked distally in the proximal posterior tibial artery, and through this hand-injection was performed demonstrating essentially no outflow.  She has no runoff to speak of currently.  tPA lytic infusion catheter was placed to address not just the vein tributaries but to recruit runoff which could not be obtained with penumbra or any other aspiration device.  This was flushed with 2 more milligrams of tPA, with 4 mg of tPA being originally instilled in the terminal popliteal position when the seeker catheter was advanced while making the exchanged for the 018 wire.  Next, triple-lumen catheter was placed in the right common femoral vein with ultrasound guidance in standard fashion.  Vein was cannulated with finder needle, wire was advanced, skin incised with 11 blade, dilatation performed, and the catheter placed.  This was secured with the  provided nylon suture.  The 6 [place] Ansell sheath was secured as well as suture.  Patient is to be transferred to the ICU for lytic infusion and aggressive anticoagulation given the concurrent presence of iliofemoral DVT.  She will be at high risk for bleeding with this combination, but limb salvage would demand an aggressive stance.  Findings:               Aortogram: Widely patent             Left lower Extremity: Recurrent complete SFA, popliteal, tibial occlusions, with ineffective penumbra, placement of lytic infusion catheter.   Disposition: Patient was taken to the intensive care unit for observation while receiving lytic infusion and aggressive anticoagulation.  Tentative current plan is for reimaging in the morning at the 24-hour mark after lytic has had a chance to recruit runoff.  Complications: None  Learta Codding 01/17/2022 5:50 AM   This note was created with Dragon Medical transcription system. Any errors in dictation are purely unintentional.

## 2022-01-17 NOTE — Progress Notes (Signed)
ANTICOAGULATION CONSULT NOTE - Initial Consult  Pharmacy Consult for Heparin Indication: DVT and limb ischemia  No Known Allergies  Patient Measurements: Height: 5\' 5"  (165.1 cm) Weight: 96.3 kg (212 lb 4.9 oz) IBW/kg (Calculated) : 57 Heparin Dosing Weight: 74.4 kg   Vital Signs: Temp: 99.4 F (37.4 C) (07/09 1815) Temp Source: Oral (07/09 1815) BP: 155/101 (07/09 1815) Pulse Rate: 125 (07/09 1815)  Labs: Recent Labs    01/17/22 0116 01/17/22 0401 01/17/22 0928 01/17/22 1815  HGB 7.4*  --  7.0*  --   HCT 27.6*  --  25.5*  --   PLT 800*  --  651*  --   APTT 30  --  41* 42*  LABPROT 14.7  --   --   --   INR 1.2  --   --   --   HEPARINUNFRC >1.10*  --   --  0.81*  CREATININE 0.71  --  0.66  --   TROPONINIHS 7 7  --   --      Estimated Creatinine Clearance: 105.1 mL/min (by C-G formula based on SCr of 0.66 mg/dL).   Medical History: Past Medical History:  Diagnosis Date   IDA (iron deficiency anemia) 12/17/2019    PTA: Eliquis 5mg  BID (Last dose  on 7/8 @ 1800) Inpatient: Heparin drip  Allergies: NKDA  Assessment: 41yo female with hx of PAD, multiple DVT's, stent placement presents to ED via EMS with complaints of LLE pain and swelling. Pt underwent thrombectomy and revascularization of LLE on 6/29 and was started on Eliquis 5 mg PO BID on 6/30. Pharmacy consulted to dose heparin for DVT/limb ischemia.    Goal of Therapy:  Heparin level 0.3-0.7 units/ml aPTT 66 - 102 seconds Monitor platelets by anticoagulation protocol: Yes  Date Time aPTT/HL Rate/Comment 0709 0116 30 / >1.10 0709 0928 41 / ---  Subtherapeutic 0709 1815 42 / 0.81 Subtherapeutic  Plan:  aPTT subtherapeutic. RN notified pharmacy to titrate heparin for treatment of DVT per Vascular team instruction (refer to Vasc sx note 7/9 @ 1620) Give heparin 2000 unit IV bolus x1 then increase heparin infusion to 1450 units/hr Check aPTT/Anti-Xa level in 6 hours and daily once consecutively  therapeutic.  Titrate by aPTT's until lab correlation is noted, then titrate by anti-xa alone. Continue to monitor H&H and platelets daily while on heparin gtt.  0117 01/17/2022,6:36 PM

## 2022-01-17 NOTE — ED Notes (Signed)
Provider at bedside

## 2022-01-17 NOTE — ED Provider Notes (Signed)
[facility] Provider Note    Event Date/Time   First MD Initiated Contact with Patient 01/17/22 [phone]     (approximate)   History   Leg Swelling   HPI  Jocelyn [name] is a 41 y.o. female with history of peripheral arterial disease on Eliquis s/p LLE angiogram w/ stent placement on 01/11/2020, readmission on 03/06/2020 with rest pain of LLE concerning for reocclusion/rethrombosis of previous stent s/p thrombolytic therapy and angiogram on 03/07/2020, mechanical thrombectomy, angioplasty 01/07/2022,  diabetes mellitus, hypertension, iron deficiency anemia, continued tobacco use who presents to the emergency department EMS with complaints of severe left leg pain.  States her leg has been swollen since discharge from the hospital on 01/09/2022.  States it has been discolored and cooler to touch since 2021.  She states that 2 days ago she started having severe pain that progressively worsened and is now having blisters to that leg that started today.  Also having shortness of breath with exertion.  No chest pain.  Last dose of Eliquis was at 6 PM.  Denies any missed doses.  No fever.  No injury to the leg.    01/07/22 Op note with Dr. Wyn Quakerew:  Procedure(s) Performed:             1.  Left lower extremity angiogram             2.  Mechanical thrombectomy of the left SFA, popliteal artery, tibioperoneal trunk, and posterior tibial artery             3.  Percutaneous transluminal angioplasty of the left posterior tibial artery with 2.5 mm diameter angioplasty balloon             4.  Percutaneous transluminal angioplasty of tibioperoneal trunk and distal popliteal artery with 4 mm diameter Lutonix drug-coated angioplasty balloon             5.  Percutaneous transluminal angioplasty of the left SFA and popliteal artery with 5 mm diameter by 30 cm length Lutonix drug-coated angioplasty balloon             6.  StarClose closure device right femoral artery  History provided by patient  and EMS.    Past Medical History:  Diagnosis Date   IDA (iron deficiency anemia) 12/17/2019    Past Surgical History:  Procedure Laterality Date   APPENDECTOMY     LOWER EXTREMITY ANGIOGRAPHY Left 01/11/2020   Procedure: Lower Extremity Angiography;  Surgeon: Annice Needyew, Jason S, MD;  Location: [facility] INVASIVE CV LAB;  Service: Cardiovascular;  Laterality: Left;   LOWER EXTREMITY ANGIOGRAPHY Left 03/06/2020   Procedure: Lower Extremity Angiography;  Surgeon: Annice Needyew, Jason S, MD;  Location: [facility] INVASIVE CV LAB;  Service: Cardiovascular;  Laterality: Left;   LOWER EXTREMITY ANGIOGRAPHY Left 03/07/2020   Procedure: Lower Extremity Angiography;  Surgeon: Annice Needyew, Jason S, MD;  Location: [facility] INVASIVE CV LAB;  Service: Cardiovascular;  Laterality: Left;   LOWER EXTREMITY ANGIOGRAPHY Left 01/06/2022   Procedure: Lower Extremity Angiography;  Surgeon: Annice Needyew, Jason S, MD;  Location: [facility] INVASIVE CV LAB;  Service: Cardiovascular;  Laterality: Left;   LOWER EXTREMITY ANGIOGRAPHY Left 01/07/2022   Procedure: Lower Extremity Angiography;  Surgeon: Annice Needyew, Jason S, MD;  Location: [facility] INVASIVE CV LAB;  Service: Cardiovascular;  Laterality: Left;    MEDICATIONS:  Prior to Admission medications   Medication Sig Start Date End Date Taking? Authorizing Provider  amLODipine (NORVASC) 5 MG tablet Take 1 tablet (5 mg total) by  mouth daily. 01/09/22   Arnetha Courser, MD  apixaban (ELIQUIS) 5 MG TABS tablet Take 1 tablet (5 mg total) by mouth 2 (two) times daily. 01/11/22   Arnetha Courser, MD  docusate sodium (COLACE) 100 MG capsule Take 1 capsule (100 mg total) by mouth 2 (two) times daily as needed for mild constipation. 01/09/22   Arnetha Courser, MD  Ferrous Sulfate (IRON) 325 (65 Fe) MG TABS Take 1 tablet (325 mg total) by mouth daily. 12/17/19 03/06/20  Sharman Cheek, MD  gabapentin (NEURONTIN) 300 MG capsule Take 1 capsule (300 mg total) by mouth 3 (three) times daily. 01/09/22   Arnetha Courser, MD  nicotine (NICODERM CQ - DOSED IN  MG/24 HOURS) 21 mg/24hr patch Place 1 patch (21 mg total) onto the skin daily. 01/12/20   Enedina Finner, MD  polyethylene glycol (MIRALAX / GLYCOLAX) 17 g packet Take 17 g by mouth daily as needed for moderate constipation. 01/09/22   Arnetha Courser, MD    Physical Exam   Triage Vital Signs:  ED Triage Vitals  Enc Vitals Group     BP 01/17/22 0156 (!) 149/86     Pulse Rate 01/17/22 0116 (!) 114     Resp 01/17/22 0156 17     Temp 01/17/22 0156 99 F (37.2 C)     Temp Source 01/17/22 0156 Oral     SpO2 01/17/22 0116 100 %     Weight 01/17/22 0112 180 lb (81.6 kg)     Height 01/17/22 0112 5\' 5"  (1.651 m)     Head Circumference --      Peak Flow --      Pain Score 01/17/22 0112 10     Pain Loc --      Pain Edu? --      Excl. in GC? --     Most recent vital signs: Vitals:   01/17/22 0300 01/17/22 0330  BP: 133/88 (!) 144/87  Pulse: (!) 117 (!) 110  Resp: 11 18  Temp:    SpO2: 100% 98%    CONSTITUTIONAL: Alert and oriented and responds appropriately to questions.  Appears extremely uncomfortable, tearful HEAD: Normocephalic, atraumatic EYES: Conjunctivae clear, pupils appear equal, sclera nonicteric ENT: normal nose; moist mucous membranes NECK: Supple, normal ROM CARD: Regular and tachycardic; S1 and S2 appreciated; no murmurs, no clicks, no rubs, no gallops RESP: Patient is tachypneic, breath sounds clear and equal bilaterally; no wheezes, no rhonchi, no rales, no hypoxia or respiratory distress, speaking full sentences ABD/GI: Normal bowel sounds; non-distended; soft, non-tender, no rebound, no guarding, no peritoneal signs BACK: The back appears normal EXT: Patient has significant swelling of the left lower extremity to the proximal thigh with induration but no redness, heat noted.  The left foot is cooler to touch than the right.  I am able to Doppler a 2+ popliteal and femoral pulse on the left but not able to Doppler a PT or DP pulse.  She has significant pain with any  palpation of the left leg.  Leg is extremely swollen but compartments seem soft.  She reports tingling in the left foot.  No joint effusion.  No bony deformity. SKIN: Normal color for age and race; warm; no rash on exposed skin NEURO: Moves all extremities equally, normal speech PSYCH: The patient's mood and manner are appropriate.   ED Results / Procedures / Treatments   LABS: (all labs ordered are listed, but only abnormal results are displayed) Labs Reviewed  CBC WITH DIFFERENTIAL/PLATELET - Abnormal; Notable  for the following components:      Result Value   WBC 13.3 (*)    RBC 3.75 (*)    Hemoglobin 7.4 (*)    HCT 27.6 (*)    MCV 73.6 (*)    MCH 19.7 (*)    MCHC 26.8 (*)    RDW 28.2 (*)    Platelets 800 (*)    Neutro Abs 10.2 (*)    Abs Immature Granulocytes 0.15 (*)    All other components within normal limits  BASIC METABOLIC PANEL - Abnormal; Notable for the following components:   Glucose, Bld 118 (*)    All other components within normal limits  HEPARIN LEVEL (UNFRACTIONATED) - Abnormal; Notable for the following components:   Heparin Unfractionated >1.10 (*)    All other components within normal limits  PROTIME-INR  BRAIN NATRIURETIC PEPTIDE  HCG, QUANTITATIVE, PREGNANCY  PROCALCITONIN  APTT  APTT  TYPE AND SCREEN  TROPONIN I (HIGH SENSITIVITY)  TROPONIN I (HIGH SENSITIVITY)     EKG:  EKG Interpretation  Date/Time:  Sunday January 17 2022 01:55:14 EDT Ventricular Rate:  117 PR Interval:  125 QRS Duration: 79 QT Interval:  311 QTC Calculation: 434 R Axis:   7 Text Interpretation: Sinus tachycardia Confirmed by Rochele Raring [phone]) on 01/17/2022 2:02:45 AM         RADIOLOGY: My personal review and interpretation of imaging: Venous Doppler shows significant occlusive thrombus from the popliteal vein to the common femoral vein.  She also appears to have acute arterial occlusion of the left SFA.  Chest x-ray clear.  I have personally reviewed all  radiology reports.   US Venous Img Lower Unilateral Left  Result Date: 01/17/2022 CLINICAL DATA:  Left leg pain and swelling EXAM: LEFT LOWER EXTREMITY VENOUS DOPPLER ULTRASOUND TECHNIQUE: Gray-scale sonography with graded compression, as well as color Doppler and duplex ultrasound were performed to evaluate the lower extremity deep venous systems from the level of the common femoral vein and including the common femoral, femoral, profunda femoral, popliteal and calf veins including the posterior tibial, peroneal and gastrocnemius veins when visible. The superficial great saphenous vein was also interrogated. Spectral Doppler was utilized to evaluate flow at rest and with distal augmentation maneuvers in the common femoral, femoral and popliteal veins. COMPARISON:  None Available. FINDINGS: Contralateral Common Femoral Vein: Respiratory phasicity is normal and symmetric with the symptomatic side. No evidence of thrombus. Normal compressibility. Common Femoral Vein: Occlusive thrombus is noted with decreased compressibility and occlusive nature. Saphenofemoral Junction: Occlusive thrombus is noted with decreased compressibility Profunda Femoral Vein: Occlusive thrombus is noted with decreased compressibility. Femoral Vein: Occlusive thrombus is noted with decreased compressibility. Popliteal Vein: Occlusive thrombus is noted with decreased compressibility. Calf Veins: Not well visualized Superficial Great Saphenous Vein: No evidence of thrombus. Normal compressibility. Venous Reflux:  None. Other Findings:  None. IMPRESSION: Significant occlusive thrombus within the left lower extremity from the popliteal vein to the common femoral vein. These findings would support that seen on recent CT examination. Electronically Signed   By: Alcide Clever M.D.   On: 01/17/2022 02:44   CT Angio Aortobifemoral W and/or Wo Contrast  Result Date: 01/17/2022 CLINICAL DATA:  Lower leg pain and swelling EXAM: CT ANGIOGRAPHY OF  ABDOMINAL AORTA WITH ILIOFEMORAL RUNOFF TECHNIQUE: Multidetector CT imaging of the abdomen, pelvis and lower extremities was performed using the standard protocol during bolus administration of intravenous contrast. Multiplanar CT image reconstructions and MIPs were obtained to evaluate the vascular anatomy. RADIATION DOSE  REDUCTION: This exam was performed according to the departmental dose-optimization program which includes automated exposure control, adjustment of the mA and/or kV according to patient size and/or use of iterative reconstruction technique. CONTRAST:  OMNIPAQUE IOHEXOL 350 MG/ML SOLN COMPARISON:  01/05/2022 FINDINGS: VASCULAR Aorta: Normal caliber aorta without aneurysm, dissection, vasculitis or significant stenosis. Celiac: Mild variant anatomy of the celiac axis is noted with the left hepatic artery arising directly from the aorta. The remainder of the celiac axis is within normal limits. SMA: Patent without evidence of aneurysm, dissection, vasculitis or significant stenosis. Renals: Dual renal arteries are noted on the right with single renal artery on the left. No obstructive changes are seen. IMA: Patent without evidence of aneurysm, dissection, vasculitis or significant stenosis. RIGHT Lower Extremity Inflow: Right common and external iliac artery are within normal limits. Common femoral artery is widely patent. Runoff: Superficial femoral artery and popliteal artery are widely patent. Normal popliteal trifurcation is noted with runoff primarily via a diminutive anterior tibial artery. Posterior tibial artery is not visualized beyond the mid calf. LEFT Lower Extremity Inflow: Mild stenosis is noted at the origin of the common iliac artery. The thrombus at extends into the lumen on the prior exam is smaller in size but persistent. This may represent a site for potential distal emboli. The external iliac artery is patent. Common femoral artery is patent as well. Runoff: Profundus  femoral artery is patent. The superficial femoral artery is occluded at its origin. Additionally there is significant stenting in the distal superficial femoral artery extending into the popliteal artery. This stent is occluded throughout its course. Muscular collaterals reconstitute the anterior tibial artery in the proximal calf. The posterior tibial artery is not visualized. Veins: Mild decreased attenuation is noted in the left iliac venous system. The vein on the left is somewhat larger than that seen on the right. This raises suspicion for deep venous thrombosis given the significant peripheral edema and swelling. Review of the MIP images confirms the above findings. NON-VASCULAR Lower chest: Lung bases are free of acute infiltrate or sizable effusion. Hepatobiliary: No focal liver abnormality is seen. No gallstones, gallbladder wall thickening, or biliary dilatation. Pancreas: Unremarkable. No pancreatic ductal dilatation or surrounding inflammatory changes. Spleen: Normal in size without focal abnormality. Adrenals/Urinary Tract: Adrenal glands are within normal limits. Kidneys demonstrate a normal enhancement pattern bilaterally. Some scarring is noted in the lower pole of the right kidney. Stomach/Bowel: No obstructive or inflammatory changes of the colon are seen. Mild diverticular changes noted without diverticulitis. Small bowel and stomach appear within normal limits. Lymphatic: No lymphadenopathy is noted. Reproductive: Uterus and bilateral adnexa are unremarkable. Other: No abdominal wall hernia or abnormality. No abdominopelvic ascites. Musculoskeletal: There is significant edema involving the left lower extremity and extending into the left buttock some reactive lymphadenopathy is noted in the medial aspect of the left thigh. The left thigh is considerably larger than that the right thigh. These inflammatory changes extend into the calf and to the level of the ankle increased from the prior exam.  IMPRESSION: VASCULAR Left lower extremity: There remains thrombus extending into the lumen of the common iliac artery although the burden is less than that seen on the prior exam. There is now occlusion of the superficial femoral artery on the left at its origin which is progressive when compared with the prior exam. The profundus femoral artery is noted with multiple muscular collaterals reconstituting the anterior tibial artery in the calf. This continues to the level of  the ankle. Right lower extremity: Patent superficial femoral and popliteal artery. The popliteal trifurcation is patent as well. The dominant runoff vessel is the diminutive anterior tibial artery. Posterior tibial artery is visualized to the mid calf. No other arterial abnormality is noted. Some mild decreased attenuation in the venous structures of the left lower extremity and left iliac veins is noted. Duplex examination is recommended to evaluate for venous thrombosis. NON-VASCULAR Considerable soft tissue swelling is noted involving the left leg from the level of the buttock to the foot. The leg is considerably increased in size. No muscular necrosis is noted. Mild diverticular change without diverticulitis. No other focal abnormality is noted. Electronically Signed   By: Alcide Clever M.D.   On: 01/17/2022 02:23   DG Chest Portable 1 View  Result Date: 01/17/2022 CLINICAL DATA:  Leg pain swelling short of breath EXAM: PORTABLE CHEST 1 VIEW COMPARISON:  01/05/2022 FINDINGS: The heart size and mediastinal contours are within normal limits. Both lungs are clear. The visualized skeletal structures are unremarkable. IMPRESSION: No active disease. Electronically Signed   By: Jasmine Pang M.D.   On: 01/17/2022 01:51     PROCEDURES:  Critical Care performed: Yes, see critical care procedure note(s)   CRITICAL CARE Performed by: Baxter Hire Ziair Penson   Total critical care time: 65 minutes  Critical care time was exclusive of separately  billable procedures and treating other patients.  Critical care was necessary to treat or prevent imminent or life-threatening deterioration.  Critical care was time spent personally by me on the following activities: development of treatment plan with patient and/or surrogate as well as nursing, discussions with consultants, evaluation of patient's response to treatment, examination of patient, obtaining history from patient or surrogate, ordering and performing treatments and interventions, ordering and review of laboratory studies, ordering and review of radiographic studies, pulse oximetry and re-evaluation of patient's condition.   Marland Kitchen1-3 Lead EKG Interpretation  Performed by: Dionysios Massman, Layla Maw, DO Authorized by: Bernis Schreur, Layla Maw, DO     Interpretation: abnormal     ECG rate:  114   ECG rate assessment: tachycardic     Rhythm: sinus tachycardia     Ectopy: none     Conduction: normal       IMPRESSION / MDM / ASSESSMENT AND PLAN / ED COURSE  I reviewed the triage vital signs and the nursing notes.   Patient here with concerns for an ischemic left leg.  The patient is on the cardiac monitor to evaluate for evidence of arrhythmia and/or significant heart rate changes.   DIFFERENTIAL DIAGNOSIS (includes but not limited to):   Arterial obstruction, cerulea dolens, cellulitis   Patient's presentation is most consistent with acute presentation with potential threat to life or bodily function.   PLAN: We will obtain CBC, BMP, troponin, BNP, EKG, chest x-ray.  Patient will need CT runoff and venous Doppler.  We will give pain medication.  Patient's last creatinine was elevated but I feel she needs the CT emergently to prevent loss of limb.  We will hydrate patient aggressively.  Page has been placed to vascular surgery.  We will start heparin.   MEDICATIONS GIVEN IN ED: Medications  heparin ADULT infusion 100 units/mL (25000 units/249mL) (1,200 Units/hr Intravenous New Bag/Given  01/17/22 0235)  HYDROmorphone (DILAUDID) injection 1 mg (1 mg Intravenous Given 01/17/22 0118)  ondansetron (ZOFRAN) injection 4 mg (4 mg Intravenous Given 01/17/22 0119)  sodium chloride 0.9 % bolus 1,000 mL (1,000 mLs Intravenous New Bag/Given 01/17/22 0229)  iohexol (OMNIPAQUE) 350 MG/ML injection 100 mL (100 mLs Intravenous Contrast Given 01/17/22 0127)  HYDROmorphone (DILAUDID) injection 1 mg (1 mg Intravenous Given 01/17/22 0224)     ED COURSE: Patient's labs show leukocytosis of 13,000.  May be reactive.  She is afebrile.  She has chronic anemia which is stable.  She has reactive thrombocytosis.  Normal electrolytes.  Renal function has improved to 0.71 down from 2.21 at the end of June.  Normal troponin, BNP.  Procalcitonin negative.  Chest x-ray reviewed/interpreted by myself and radiologist and shows no acute abnormality.  Patient's venous Doppler reviewed/interpreted by myself and radiologist and shows occlusive thrombus from the left popliteal vein to the common femoral vein.  CT runoff reviewed/interpreted by myself and radiologist and shows improved thrombus burden in the common iliac artery but new occlusion of the superficial femoral artery.   CONSULTS:  2:23 AM  Discussed case with Dr. Renne Crigler with vascular surgery.  Appreciate his help.   Vascular surgery has seen the patient in the emergency department and plans to take her to the operating room.  Would like heparin to be started.  Would like hospitalist admission.   3:51 AM  Consulted and discussed patient's case with hospitalist, Dr. Para March.  I have recommended admission and consulting physician agrees and will place admission orders.  Patient (and family if present) agree with this plan.   I reviewed all nursing notes, vitals, pertinent previous records.  All labs, EKGs, imaging ordered have been independently reviewed and interpreted by myself.   OUTSIDE RECORDS REVIEWED: Reviewed patient's recent operative note with Dr. Wyn Quaker on  01/07/2022.       FINAL CLINICAL IMPRESSION(S) / ED DIAGNOSES   Final diagnoses:  Acute lower limb ischemia  Superficial femoral artery occlusion (HCC)  Acute deep vein thrombosis (DVT) of left lower extremity, unspecified vein (HCC)     Rx / DC Orders   ED Discharge Orders     None        Note:  This document was prepared using Dragon voice recognition software and may include unintentional dictation errors.   Eleonore Shippee, Layla Maw, DO 01/17/22 [phone]

## 2022-01-17 NOTE — Progress Notes (Signed)
I discussed with the patient the nature of angiographic procedures, especially the limited patencies of any endovascular intervention versus open surgery.  However, these angiographic procedures are far less morbid.  The patient is aware of that the risks of an angiographic procedure include but are not limited to: bleeding, infection, access site complications, renal failure, embolization, rupture of vessel, dissection, possible need for emergent surgical intervention, possible need for surgical procedures to treat the patient's pathology, anaphylactic reaction to contrast, and stroke and death.  The patient is aware of the risks and agrees to proceed.

## 2022-01-17 NOTE — Assessment & Plan Note (Addendum)
Anemia d/t surgical blood loss   1u pRBC for Hgb 7.0 on 7/9  2 unit PRBC Hgb 6.3 on 7/13 (POD1)  transfuse to keep Hgb >8  Monitor CBC

## 2022-01-17 NOTE — Progress Notes (Signed)
Discussed with nursing ongoing clinical status.  Pain better controlled, leg still very swollen.  At this point, go to full heparin dosing, therapeutic for DVT given the circumstances of extensive infrainguinal thrombus, possible iliac femoral thrombus, and arterial occlusion.  Swelling that she has of the leg goes all the way up the leg, more consistent with swelling from iliofemoral DVT than compartment syndrome.  Has some dopplerable flow through the posterior tibial distribution, consistent with previous angiogram findings both today and last week.  The issue is that she has very little runoff into the smaller tributaries that would maintain flow through the main distributions of named vessels.  Lytic to continue overnight to try to develop this outflow tracts in multiple areas, otherwise she likely will thrombose once again.  Therapeutic heparin will hopefully help the venous outflow tract and its contribution to leg swelling as well.  May need a venous outflow procedure tomorrow.  I did touch base earlier today with Dr. Wyn Quaker about her representation early this morning and need for ongoing lytic infusion.  Runoff as demonstrated previously in the foot was very poor, suggesting very guarded prognosis for limb salvage.

## 2022-01-17 NOTE — ED Notes (Signed)
Pt to emergent ct.

## 2022-01-17 NOTE — Assessment & Plan Note (Addendum)
   Cont home amlodipine

## 2022-01-18 ENCOUNTER — Encounter: Payer: Self-pay | Admitting: Anesthesiology

## 2022-01-18 ENCOUNTER — Encounter
Admission: EM | Disposition: A | Discharge status: Rehabilitation Facility | Payer: Self-pay | Source: Home / Self Care | Attending: Hospitalist

## 2022-01-18 ENCOUNTER — Encounter: Payer: Self-pay | Admitting: Specialist

## 2022-01-18 ENCOUNTER — Inpatient Hospital Stay: Payer: Medicaid Other

## 2022-01-18 DIAGNOSIS — I70222 Atherosclerosis of native arteries of extremities with rest pain, left leg: Secondary | ICD-10-CM

## 2022-01-18 DIAGNOSIS — R000 Tachycardia, unspecified: Secondary | ICD-10-CM

## 2022-01-18 DIAGNOSIS — I743 Embolism and thrombosis of arteries of the lower extremities: Secondary | ICD-10-CM

## 2022-01-18 DIAGNOSIS — I70262 Atherosclerosis of native arteries of extremities with gangrene, left leg: Secondary | ICD-10-CM

## 2022-01-18 DIAGNOSIS — R509 Fever, unspecified: Secondary | ICD-10-CM

## 2022-01-18 HISTORY — PX: LOWER EXTREMITY ANGIOGRAPHY: CATH118251

## 2022-01-18 LAB — CBC
HCT: 28.5 % — ABNORMAL LOW (ref 36.0–46.0)
Hemoglobin: 8.1 g/dL — ABNORMAL LOW (ref 12.0–15.0)
MCH: 21.5 pg — ABNORMAL LOW (ref 26.0–34.0)
MCHC: 28.4 g/dL — ABNORMAL LOW (ref 30.0–36.0)
MCV: 75.8 fL — ABNORMAL LOW (ref 80.0–100.0)
Platelets: 429 10*3/uL — ABNORMAL HIGH (ref 150–400)
RBC: 3.76 MIL/uL — ABNORMAL LOW (ref 3.87–5.11)
RDW: 26.2 % — ABNORMAL HIGH (ref 11.5–15.5)
WBC: 16.1 10*3/uL — ABNORMAL HIGH (ref 4.0–10.5)
nRBC: 0 % (ref 0.0–0.2)

## 2022-01-18 LAB — APTT
aPTT: 51 seconds — ABNORMAL HIGH (ref 24–36)
aPTT: 104 seconds — ABNORMAL HIGH (ref 24–36)

## 2022-01-18 LAB — BASIC METABOLIC PANEL
Anion gap: 7 (ref 5–15)
BUN: 8 mg/dL (ref 6–20)
CO2: 23 mmol/L (ref 22–32)
Calcium: 8.5 mg/dL — ABNORMAL LOW (ref 8.9–10.3)
Chloride: 107 mmol/L (ref 98–111)
Creatinine, Ser: 0.63 mg/dL (ref 0.44–1.00)
GFR, Estimated: >60 mL/min (ref >60)
Glucose, Bld: 172 mg/dL — ABNORMAL HIGH (ref 70–99)
Potassium: 3.6 mmol/L (ref 3.5–5.1)
Sodium: 137 mmol/L (ref 135–145)

## 2022-01-18 LAB — MAGNESIUM: Magnesium: 1.5 mg/dL — ABNORMAL LOW (ref 1.7–2.4)

## 2022-01-18 LAB — GLUCOSE, CAPILLARY
Glucose-Capillary: 150 mg/dL — ABNORMAL HIGH (ref 70–99)
Glucose-Capillary: 115 mg/dL — ABNORMAL HIGH (ref 70–99)
Glucose-Capillary: 118 mg/dL — ABNORMAL HIGH (ref 70–99)

## 2022-01-18 LAB — HEPARIN LEVEL (UNFRACTIONATED): Heparin Unfractionated: 0.5 IU/mL (ref 0.30–0.70)

## 2022-01-18 SURGERY — LOWER EXTREMITY ANGIOGRAPHY
Anesthesia: Moderate Sedation | Laterality: Left

## 2022-01-18 MED ORDER — MAGNESIUM SULFATE IN D5W 1-5 GM/100ML-% IV SOLN
1.0000 g | Freq: Once | INTRAVENOUS | Status: DC
Start: 2022-01-18 — End: 2022-01-19
  Filled 2022-01-18: qty 100

## 2022-01-18 MED ORDER — LABETALOL HCL 5 MG/ML IV SOLN
INTRAVENOUS | Status: DC | PRN
  Administered 2022-01-18: 20 mg via INTRAVENOUS

## 2022-01-18 MED ORDER — HEPARIN (PORCINE) 25000 UT/250ML-% IV SOLN
1800.0000 [IU]/h | INTRAVENOUS | Status: DC
  Administered 2022-01-18 – 2022-01-20 (×3): 1650 [IU]/h via INTRAVENOUS
  Filled 2022-01-18 (×2): qty 250

## 2022-01-18 MED ORDER — HEPARIN (PORCINE) 25000 UT/250ML-% IV SOLN
INTRAVENOUS | Status: AC
  Filled 2022-01-18: qty 250

## 2022-01-18 MED ORDER — CEFAZOLIN SODIUM-DEXTROSE 2-4 GM/100ML-% IV SOLN: 2.0000 g | Freq: Once | INTRAVENOUS | Status: AC

## 2022-01-18 MED ORDER — FENTANYL CITRATE (PF) 100 MCG/2ML IJ SOLN
INTRAMUSCULAR | Status: AC
  Filled 2022-01-18: qty 2

## 2022-01-18 MED ORDER — IODIXANOL 320 MG/ML IV SOLN
INTRAVENOUS | Status: DC | PRN
  Administered 2022-01-18: 30 mL

## 2022-01-18 MED ORDER — FENTANYL CITRATE (PF) 100 MCG/2ML IJ SOLN
INTRAMUSCULAR | Status: DC | PRN
  Administered 2022-01-18: 50 ug via INTRAVENOUS
  Administered 2022-01-18 (×2): 25 ug via INTRAVENOUS

## 2022-01-18 MED ORDER — MORPHINE SULFATE (PF) 2 MG/ML IV SOLN
INTRAVENOUS | Status: AC
  Filled 2022-01-18: qty 1

## 2022-01-18 MED ORDER — HEPARIN SODIUM (PORCINE) 1000 UNIT/ML IJ SOLN
INTRAMUSCULAR | Status: AC
  Filled 2022-01-18: qty 10

## 2022-01-18 MED ORDER — LABETALOL HCL 5 MG/ML IV SOLN
5.0000 mg | Freq: Once | INTRAVENOUS | Status: AC
  Administered 2022-01-18: 5 mg via INTRAVENOUS
  Filled 2022-01-18: qty 4

## 2022-01-18 MED ORDER — MIDAZOLAM HCL 5 MG/5ML IJ SOLN
INTRAMUSCULAR | Status: AC
  Filled 2022-01-18: qty 5

## 2022-01-18 MED ORDER — SODIUM CHLORIDE 0.9 % IV SOLN
0.5000 mg/h | INTRAVENOUS | Status: DC
  Administered 2022-01-18: 0.5 mg/h
  Filled 2022-01-18: qty 10

## 2022-01-18 MED ORDER — MAGNESIUM SULFATE 2 GM/50ML IV SOLN: 2.0000 g | Freq: Once | INTRAVENOUS | Status: DC

## 2022-01-18 MED ORDER — MAGNESIUM SULFATE IN D5W 1-5 GM/100ML-% IV SOLN
1.0000 g | Freq: Once | INTRAVENOUS | Status: DC
  Filled 2022-01-18: qty 100

## 2022-01-18 MED ORDER — HEPARIN BOLUS VIA INFUSION
2200.0000 [IU] | Freq: Once | INTRAVENOUS | Status: AC
  Administered 2022-01-18: 2200 [IU] via INTRAVENOUS
  Filled 2022-01-18: qty 2200

## 2022-01-18 MED ORDER — SODIUM CHLORIDE 0.9 % IV BOLUS
500.0000 mL | Freq: Once | INTRAVENOUS | Status: AC
  Administered 2022-01-18: 500 mL via INTRAVENOUS

## 2022-01-18 MED ORDER — CEFAZOLIN SODIUM-DEXTROSE 2-4 GM/100ML-% IV SOLN
INTRAVENOUS | Status: AC
  Administered 2022-01-18: 2 g via INTRAVENOUS
  Filled 2022-01-18: qty 100

## 2022-01-18 MED ORDER — LABETALOL HCL 5 MG/ML IV SOLN
INTRAVENOUS | Status: AC
  Filled 2022-01-18: qty 4

## 2022-01-18 MED ORDER — MIDAZOLAM HCL 2 MG/2ML IJ SOLN
INTRAMUSCULAR | Status: DC | PRN
  Administered 2022-01-18: .5 mg via INTRAVENOUS
  Administered 2022-01-18: 2 mg via INTRAVENOUS
  Administered 2022-01-18: 1 mg via INTRAVENOUS

## 2022-01-18 MED ORDER — SODIUM CHLORIDE 0.9 % IV SOLN: INTRAVENOUS | Status: DC

## 2022-01-18 SURGICAL SUPPLY — 12 items
BALLN LUTONIX 018 5X300X130 (BALLOONS) ×2
BALLN ULTRVRSE 2.5X300X150 (BALLOONS) ×2
BALLOON LUTONIX 018 5X300X130 (BALLOONS) IMPLANT
BALLOON ULTRVRSE 2.5X300X150 (BALLOONS) IMPLANT
CANISTER PENUMBRA ENGINE (MISCELLANEOUS) ×1 IMPLANT
CATH INDIGO CAT6 KIT (CATHETERS) ×1 IMPLANT
DEVICE SAFEGUARD 24CM (GAUZE/BANDAGES/DRESSINGS) ×1 IMPLANT
DEVICE STARCLOSE SE CLOSURE (Vascular Products) ×1 IMPLANT
KIT ENCORE 26 ADVANTAGE (KITS) ×1 IMPLANT
PACK ANGIOGRAPHY (CUSTOM PROCEDURE TRAY) ×2 IMPLANT
WIRE G V18X300CM (WIRE) ×1 IMPLANT
WIRE GUIDERIGHT .035X150 (WIRE) ×1 IMPLANT

## 2022-01-18 NOTE — Progress Notes (Signed)
This RN updated by Rob Bunting Lynden Ang) patient is on schedule for 2pm today for surgical procedure. Patient and family at bedside updated.

## 2022-01-18 NOTE — TOC Initial Note (Signed)
Transition of Care [facility]) - Initial/Assessment Note    Patient Details  Name: Jocelyn [name] MRN: [id number] Date of Birth: [birth year]  Transition of [facility]) CM/SW Contact:    Shelbie Hutching, RN Phone Number: 01/18/2022, 11:09 AM  Clinical Narrative:                 Patient admitted to the hospital with critical limb ischemia of left lower leg, plan for today is for patient to go back with vascular surgery for arteriogram, lytics infusion follow up from yesterday. RNCM met with patient at the bedside, patient's mother is present and wanted to ask about short term disability.  Patient defers all questions to her mother, she seems withdrawn, depressed.  RNCM informed patient and mother that Financial Counselor will be contacted to discuss options, she will not qualify for short term disability through her work as she has only been there for 2 weeks, she also has not been there long enough for her insurance to kick in. Patient was discharged on 7/1 for same diagnosis LLE thrombosis.  Patient lives at home with her 2 adult children, they live in [place] in an apartment, she has been walking with a walker and has not been able to go up and down stairs, discharged on 7/1 via EMS.  TOC will cont to follow.    Expected Discharge Plan: Home/Self Care Barriers to Discharge: Continued Medical Work up   Patient Goals and CMS Choice Patient states their goals for this hospitalization and ongoing recovery are:: patient does not state goals, she is withdrawn and depressed      Expected Discharge Plan and Services Expected Discharge Plan: Home/Self Care       Living arrangements for the past 2 months: Apartment                 DME Arranged: N/A DME Agency: NA                  Prior Living Arrangements/Services Living arrangements for the past 2 months: Apartment Lives with:: Adult Children Patient language and need for interpreter reviewed:: Yes Do you feel safe going back to the  place where you live?: Yes      Need for Family Participation in Patient Care: Yes (Comment) Care giver support system in place?: Yes (comment) Current home services: DME (RW) Criminal Activity/Legal Involvement Pertinent to Current Situation/Hospitalization: No - Comment as needed  Activities of Daily Living Home Assistive Devices/Equipment: None ADL Screening (condition at time of admission) Patient's cognitive ability adequate to safely complete daily activities?: Yes Is the patient deaf or have difficulty hearing?: No Does the patient have difficulty seeing, even when wearing glasses/contacts?: No Does the patient have difficulty concentrating, remembering, or making decisions?: No Patient able to express need for assistance with ADLs?: Yes Does the patient have difficulty dressing or bathing?: Yes Independently performs ADLs?: No Does the patient have difficulty walking or climbing stairs?: Yes Weakness of Legs: Both Weakness of Arms/Hands: None  Permission Sought/Granted Permission sought to share information with : Case Manager, Family Supports Permission granted to share information with : Yes, Verbal Permission Granted  Share Information with NAME: Cornelia Copa     Permission granted to share info w Relationship: mother  Permission granted to share info w Contact Information: [phone]  Emotional Assessment Appearance:: Appears stated age Attitude/Demeanor/Rapport: Avoidant, Guarded Affect (typically observed): Withdrawn, Quiet Orientation: : Oriented to Self, Oriented to Place, Oriented to  Time, Oriented to Situation Alcohol /  Substance Use: Not Applicable Psych Involvement: No (comment)  Admission diagnosis:  Ischemia of extremity [I99.8] Superficial femoral artery occlusion (HCC) [I70.209] Acute deep vein thrombosis (DVT) of left lower extremity, unspecified vein (HCC) [I82.402] Critical limb ischemia of left lower extremity (HCC) [I70.222] Acute lower limb  ischemia [I99.8] Patient Active Problem List   Diagnosis Date Noted   Acute deep vein thrombosis (DVT) of left femoral vein ([place]) 01/17/2022   Hyponatremia 01/06/2022   Critical limb ischemia of left lower extremity ([place]) 01/10/2020   Diabetes mellitus without complication (HCC) 71/83/6725   IDA (iron deficiency anemia) 12/17/2019   HTN (hypertension) 12/17/2019   PCP:  [facility] [place]:   [place] [facility]), [place] [address] [place] [address] Phone: [phone] Fax: [place], [place] - [address] [place] [address] Phone: [phone] Fax: [phone]     Social Determinants of Health (SDOH) Interventions    Readmission Risk Interventions    01/18/2022   11:07 AM  Readmission Risk Prevention Plan  Transportation Screening Complete  PCP or Specialist Appt within 5-7 Days Complete  Home Care Screening Complete  Medication Review (RN CM) Complete

## 2022-01-18 NOTE — Op Note (Signed)
[facility]  Percutaneous Study/Intervention Procedural Note   Date of Surgery: 01/18/2022  Surgeon(s):Laterrian Hevener    Assistants:none  Pre-operative Diagnosis: PAD with gangrene left foot status post overnight thrombolytic therapy  Post-operative diagnosis:  Same  Procedure(s) Performed:             1.  Left lower extremity angiogram             2.  Mechanical thrombectomy of the left SFA, popliteal arteries, tibioperoneal trunk, proximal posterior tibial artery with the penumbra CAT 6 device             3.  Percutaneous transluminal angioplasty of left posterior tibial artery and tibioperoneal trunk with 2.5 mm diameter by 30 cm length angioplasty balloon             4.  Percutaneous transluminal angioplasty of the left SFA and popliteal arteries with 2 inflations with a 5 mm diameter by 30 cm length Lutonix drug-coated angioplasty balloon             5.  StarClose closure device right femoral artery  EBL: 100 cc  Contrast: 30 cc  Fluoro Time: 6 minutes  Moderate Conscious Sedation Time: approximately 38 minutes using 3.5 mg of Versed and 100 mcg of Fentanyl              Indications:  Patient is a 41 y.o.female with continued rest pain of the left foot and now with gangrenous changes to her left foot.  She has been running tPA overnight in hopes of restoring some flow distally and is brought back for second look angiography.  She also has severe left leg DVT which is worsening the situation.  Due to the limb threatening nature of the situation, angiogram was performed for attempted limb salvage. The patient is aware that if the procedure fails, amputation would be expected.  The patient also understands that even with successful revascularization, amputation may still be required due to the severity of the situation.  Risks and benefits are discussed and informed consent is obtained.   Procedure:  The patient was identified and appropriate procedural time out was  performed.  The patient was then placed supine on the table and prepped and draped in the usual sterile fashion. Moderate conscious sedation was administered during a face to face encounter with the patient throughout the procedure with my supervision of the RN administering medicines and monitoring the patient's vital signs, pulse oximetry, telemetry and mental status throughout from the start of the procedure until the patient was taken to the recovery room.  The existing thrombolytic catheter was removed and a V18 wire was parked in the distal left posterior tibial artery.  Selective left lower extremity angiogram was then performed. This demonstrated continued occlusion of the left SFA, popliteal artery, and all 3 tibial vessels with no runoff seen distally.  I then made several passes with the penumbra CAT 6 device in the left SFA, popliteal artery, tibioperoneal trunk, and most proximal posterior tibial artery.  After multiple passes of mechanical thrombectomy, continued occlusion was seen.  I then performed balloon angioplasty of the left posterior tibial artery and tibioperoneal trunk with a 2.5 mm diameter by 30 cm length angioplasty balloon from the distal posterior tibial artery up to the distal portion of the previously placed stent in the tibioperoneal trunk.  This inflated to 10 atm for 1 minute.  2 inflations with a 5 mm diameter by 30 cm length Lutonix drug-coated angioplasty  balloon was then performed in the left SFA and popliteal artery.  These inflations were 6 to 8 atm for a minute.  Completion imaging still showed no flow with only flow through the profunda femoris artery.  At this point, it was clear that this was not going to be a salvageable situation and amputation will be required.  She will likely require venous intervention to help the amputation heal due to her massive left leg swelling.  I elected to terminate the procedure. The sheath was removed and StarClose closure device was  deployed in the right femoral artery with excellent hemostatic result. The patient was taken to the recovery room in stable condition having tolerated the procedure well.  Findings:                           Left Lower Extremity: Continued occlusion of the left SFA, popliteal artery, and all 3 tibial vessels with no runoff seen distally on initial imaging or after attempts at intervention   Disposition: Patient was taken to the recovery room in stable condition having tolerated the procedure well.  Complications: None  Leotis Pain 01/18/2022 5:57 PM   This note was created with Dragon Medical transcription system. Any errors in dictation are purely unintentional.

## 2022-01-18 NOTE — Interval H&P Note (Signed)
History and Physical Interval Note:  01/18/2022 2:54 PM  Jocelyn [name]  has presented today for surgery, with the diagnosis of Ischemia.  The various methods of treatment have been discussed with the patient and family. After consideration of risks, benefits and other options for treatment, the patient has consented to  Procedure(s): Lower Extremity Angiography (Left) as a surgical intervention.  The patient's history has been reviewed, patient examined, no change in status, stable for surgery.  I have reviewed the patient's chart and labs.  Questions were answered to the patient's satisfaction.     [name]

## 2022-01-18 NOTE — Progress Notes (Signed)
Patient NPO since midnight. Vascular called night shift RN Morene Rankins while this RN beside her on phone to update staff patient would be worked in today for a stent and or thrombectomy. Vascular to update team when patient placed on schedule.

## 2022-01-18 NOTE — Assessment & Plan Note (Addendum)
--  monitor and replete PRN ?

## 2022-01-18 NOTE — Progress Notes (Signed)
Patient transported to specials procedure accompanied by CN Sarah with event.

## 2022-01-18 NOTE — Progress Notes (Signed)
  PROGRESS NOTE    Jocelyn [name]  JYN:[id number] DOB: 06-01-1981 DOA: 01/17/2022 PCP: [facility]  ARCL/NONE  LOS: 1 day   Brief hospital course: No notes on file  Assessment & Plan: Jocelyn [name] is a 41 y.o. female with medical history significant of DM, HTN, tobacco use disorder, iron deficiency anemia, peripheral arterial disease on Eliquis with history of left lower extremity stent placement in July 2021 complicated by in-stent thrombosis requiring thrombolytic therapy and more recently on 01/07/2022 undergoing mechanical thrombectomy of the left SFA with angioplasty who presents to the ED with a 2-day history of severe pain of the left leg associated with the appearance of blisters on the leg,.  She also has shortness of breath starting today but without chest pain.  She has been compliant with her Eliquis.   * Critical limb ischemia of left lower extremity (HCC) PAD with recurrent left leg arterial occlusion from the SFA origin down --started on Alteplase Plan: --angiogram with vascular today --continuous morphine with IV morphine PRN and Norco PRN, can increase rate of continuous morphine if needing frequent PRN's.  Acute deep vein thrombosis (DVT) of left femoral vein (HCC) s/p angioplasty and thrombectomy on 01/07/2022 and was compliant with her Eliquis --tense edema in LLE --started on heparin gtt Plan: --cont heparin gtt  IDA (iron deficiency anemia) --1u pRBC for Hgb 7.0 on 7/9 --transfuse to keep Hgb >8  Fever --mild fever to 100.4, likely due to extensive DVT's --monitor for now  Hypomagnesemia --monitor and replete PRN  HTN (hypertension) Cont home amlodipine   Diabetes mellitus without complication (HCC) Sliding scale insulin coverage    DVT prophylaxis: QM:[id number] gtt Code Status: Full code  Family Communication:  Level of care: Stepdown Dispo:   The patient is from: home Anticipated d/c is to: home Anticipated d/c date is: >  3 days Patient currently is not medically ready to d/c due to: pending vascular intervention   Subjective and Interval History:  Pain controlled on current rate of continuous morphine.    Going for Lower Extremity Angiography this afternoon.   Objective: Vitals:   01/18/22 1805 01/18/22 1811 01/18/22 1815 01/18/22 1830  BP: (!) 143/100 (!) 157/96 (!) 146/101 (!) 140/97  Pulse: (!) 0 (!) 141 (!) 139 (!) 142  Resp:  (!) 32 20 (!) 29  Temp:      TempSrc:      SpO2:  94% 97% 95%  Weight:      Height:        Intake/Output Summary (Last 24 hours) at 01/18/2022 1837 Last data filed at 01/18/2022 1500 Gross per 24 hour  Intake 442.98 ml  Output 1350 ml  Net -907.02 ml   Filed Weights   01/17/22 0112 01/17/22 0700  Weight: 81.6 kg 96.3 kg    Examination:   Constitutional: NAD, AAOx3 HEENT: conjunctivae and lids normal, EOMI CV: No cyanosis.   RESP: normal respiratory effort, on RA Extremities: tense edema in LLE with blisters  Neuro: II - XII grossly intact.   Psych: Normal mood and affect.  Appropriate judgement and reason   Data Reviewed: I have personally reviewed labs and imaging studies  Time spent: 50 minutes  Darlin Priestly, MD [facility] If 7PM-7AM, please contact night-coverage 01/18/2022, 6:37 PM

## 2022-01-18 NOTE — Assessment & Plan Note (Signed)
Resolved

## 2022-01-18 NOTE — Progress Notes (Addendum)
ANTICOAGULATION CONSULT NOTE  Pharmacy Consult for Heparin Indication: DVT and limb ischemia  No Known Allergies  Patient Measurements: Height: 5\' 5"  (165.1 cm) Weight: 96.3 kg (212 lb 4.9 oz) IBW/kg (Calculated) : 57 Heparin Dosing Weight: 74.4 kg   Vital Signs: Temp: 99.7 F (37.6 C) (07/10 0400) Temp Source: Oral (07/10 0400) BP: 139/89 (07/10 0400) Pulse Rate: 119 (07/10 0400)  Labs: Recent Labs    01/17/22 0116 01/17/22 0401 01/17/22 0928 01/17/22 1815 01/17/22 2030 01/18/22 0235 01/18/22 0515  HGB 7.4*  --  7.0*  --  8.2*  --  8.1*  HCT 27.6*  --  25.5*  --  28.5*  --  28.5*  PLT 800*  --  651*  --   --   --  429*  APTT 30  --  41* 42*  --  51*  --   LABPROT 14.7  --   --   --   --   --   --   INR 1.2  --   --   --   --   --   --   HEPARINUNFRC >1.10*  --   --  0.81*  --   --   --   CREATININE 0.71  --  0.66  --   --   --  0.63  TROPONINIHS 7 7  --   --   --   --   --      Estimated Creatinine Clearance: 105.1 mL/min (by C-G formula based on SCr of 0.63 mg/dL).   Medical History: Past Medical History:  Diagnosis Date   IDA (iron deficiency anemia) 12/17/2019    PTA: Eliquis 5mg  BID (Last dose  on 7/8 @ 1800) Inpatient: Heparin drip  Allergies: NKDA  Assessment: 41yo female with hx of PAD, multiple DVT's, stent placement presents to ED via EMS with complaints of LLE pain and swelling. Pt underwent thrombectomy and revascularization of LLE on 6/29 and was started on Eliquis 5 mg PO BID on 6/30. Pharmacy consulted to dose heparin for DVT/limb ischemia.   She is also receiving alteplase 0.02 mg/mL at 0.5 mg/hr via infusion catheter to improve inflow   Goal of Therapy:  Heparin level 0.3-0.7 units/ml aPTT 66 - 102 seconds Monitor platelets by anticoagulation protocol: Yes  Date Time aPTT/HL Rate/Comment 0709 0116 30 / >1.10 0709 0928 41 / ---  Subtherapeutic 0709 1815 42 / 0.81 Subtherapeutic 0710    0235    51                   Subtherapeutic   0710 0801 104/ 0.50  Plan:  aPTT slightly supratherapeutic (heparin level therapeutic so not yet correlating) Decrease heparin infusion rate slightly to 1650 units/hr.  Will recheck aPTT 6 hrs after rate change.  Will continue to use aPTT to guide dosing until HL and aPTT correlate.  Continue to monitor H&H and platelets daily while on heparin gtt.  0710 01/18/2022,6:52 AM

## 2022-01-18 NOTE — Progress Notes (Signed)
       CROSS COVER NOTE  NAME: Jocelyn [name] MRN: [id number] DOB : October 19, 1980    Date of Service   01/18/2022  HPI/Events of Note   Secure chat received from nursing reporting HR in 140s after returning from procedural area. Patient is asymptomatic.  Temp 103.67F HR 149 BP 135/86 RR 24  Interventions   Plan: EKG 5 mg IV Labetalol 500 mL NS bolus PRN Acetaminophen Pain control    This document was prepared using Dragon voice recognition software and may include unintentional dictation errors.  Bishop Limbo DNP, MHA, FNP-BC Nurse Practitioner [facility] Pager [phone]

## 2022-01-18 NOTE — Progress Notes (Signed)
ANTICOAGULATION CONSULT NOTE - Initial Consult  Pharmacy Consult for Heparin Indication: DVT and limb ischemia  No Known Allergies  Patient Measurements: Height: 5\' 5"  (165.1 cm) Weight: 96.3 kg (212 lb 4.9 oz) IBW/kg (Calculated) : 57 Heparin Dosing Weight: 74.4 kg   Vital Signs: Temp: 98.7 F (37.1 C) (07/10 0000) Temp Source: Oral (07/10 0000) BP: 145/85 (07/10 0200) Pulse Rate: 119 (07/10 0000)  Labs: Recent Labs    01/17/22 0116 01/17/22 0401 01/17/22 0928 01/17/22 1815 01/17/22 2030 01/18/22 0235  HGB 7.4*  --  7.0*  --  8.2*  --   HCT 27.6*  --  25.5*  --  28.5*  --   PLT 800*  --  651*  --   --   --   APTT 30  --  41* 42*  --  51*  LABPROT 14.7  --   --   --   --   --   INR 1.2  --   --   --   --   --   HEPARINUNFRC >1.10*  --   --  0.81*  --   --   CREATININE 0.71  --  0.66  --   --   --   TROPONINIHS 7 7  --   --   --   --      Estimated Creatinine Clearance: 105.1 mL/min (by C-G formula based on SCr of 0.66 mg/dL).   Medical History: Past Medical History:  Diagnosis Date   IDA (iron deficiency anemia) 12/17/2019    PTA: Eliquis 5mg  BID (Last dose  on 7/8 @ 1800) Inpatient: Heparin drip  Allergies: NKDA  Assessment: 41yo female with hx of PAD, multiple DVT's, stent placement presents to ED via EMS with complaints of LLE pain and swelling. Pt underwent thrombectomy and revascularization of LLE on 6/29 and was started on Eliquis 5 mg PO BID on 6/30. Pharmacy consulted to dose heparin for DVT/limb ischemia.    Goal of Therapy:  Heparin level 0.3-0.7 units/ml aPTT 66 - 102 seconds Monitor platelets by anticoagulation protocol: Yes  Date Time aPTT/HL Rate/Comment 0709 0116 30 / >1.10 0709 0928 41 / ---  Subtherapeutic 0709 1815 42 / 0.81 Subtherapeutic 0710    0235    51                   Subtherapeutic   Plan:  RN notified pharmacy to titrate heparin for treatment of DVT per Vascular team instruction (refer to Vasc sx note 7/9 @  1620)  7/10:  aPTT @ 0235 = 51, SUBtherapeutic  Will order heparin 2200 units IV X 1 bolus and increase drip rate to 1700 units/hr.  Will recheck HL and aPTT 6 hrs after rate change.  Will continue to use aPTT to guide dosing until HL and aPTT correlate.  Continue to monitor H&H and platelets daily while on heparin gtt.  Makhai Fulco D 01/18/2022,3:07 AM

## 2022-01-18 NOTE — Progress Notes (Signed)
PT Cancellation Note  Patient Details Name: Jocelyn [name] MRN: [id number] DOB: 01/21/1981   Cancelled Treatment:    Reason Eval/Treat Not Completed: Patient at procedure or test/unavailable. Confirmed with nurse that patient is going off the floor soon for a lower extremity procedure with vascular surgery. PT will follow up tomorrow as appropriate.   Donna Bernard, PT, MPT  Ina Homes 01/18/2022, 2:39 PM

## 2022-01-19 ENCOUNTER — Encounter: Payer: Self-pay | Admitting: Vascular Surgery

## 2022-01-19 ENCOUNTER — Encounter
Admission: EM | Disposition: A | Discharge status: Rehabilitation Facility | Payer: Self-pay | Source: Home / Self Care | Attending: Hospitalist

## 2022-01-19 DIAGNOSIS — I82402 Acute embolism and thrombosis of unspecified deep veins of left lower extremity: Secondary | ICD-10-CM

## 2022-01-19 DIAGNOSIS — R338 Other retention of urine: Secondary | ICD-10-CM

## 2022-01-19 DIAGNOSIS — I82412 Acute embolism and thrombosis of left femoral vein: Secondary | ICD-10-CM

## 2022-01-19 DIAGNOSIS — I96 Gangrene, not elsewhere classified: Secondary | ICD-10-CM

## 2022-01-19 DIAGNOSIS — R509 Fever, unspecified: Secondary | ICD-10-CM

## 2022-01-19 DIAGNOSIS — Z91148 Patient's other noncompliance with medication regimen for other reason: Secondary | ICD-10-CM

## 2022-01-19 DIAGNOSIS — Z720 Tobacco use: Secondary | ICD-10-CM

## 2022-01-19 LAB — CBC
HCT: 26.1 % — ABNORMAL LOW (ref 36.0–46.0)
Hemoglobin: 7.4 g/dL — ABNORMAL LOW (ref 12.0–15.0)
MCH: 21.4 pg — ABNORMAL LOW (ref 26.0–34.0)
MCHC: 28.4 g/dL — ABNORMAL LOW (ref 30.0–36.0)
MCV: 75.7 fL — ABNORMAL LOW (ref 80.0–100.0)
Platelets: 320 10*3/uL (ref 150–400)
RBC: 3.45 MIL/uL — ABNORMAL LOW (ref 3.87–5.11)
RDW: 26.4 % — ABNORMAL HIGH (ref 11.5–15.5)
WBC: 20.3 10*3/uL — ABNORMAL HIGH (ref 4.0–10.5)
nRBC: 0 % (ref 0.0–0.2)

## 2022-01-19 LAB — CK: Total CK: 181 U/L (ref 38–234)

## 2022-01-19 LAB — BASIC METABOLIC PANEL
Anion gap: 7 (ref 5–15)
BUN: 7 mg/dL (ref 6–20)
CO2: 24 mmol/L (ref 22–32)
Calcium: 8.2 mg/dL — ABNORMAL LOW (ref 8.9–10.3)
Chloride: 106 mmol/L (ref 98–111)
Creatinine, Ser: 0.59 mg/dL (ref 0.44–1.00)
GFR, Estimated: >60 mL/min (ref >60)
Glucose, Bld: 103 mg/dL — ABNORMAL HIGH (ref 70–99)
Potassium: 3.6 mmol/L (ref 3.5–5.1)
Sodium: 137 mmol/L (ref 135–145)

## 2022-01-19 LAB — PROCALCITONIN: Procalcitonin: 80.6 ng/mL

## 2022-01-19 LAB — GLUCOSE, CAPILLARY
Glucose-Capillary: 98 mg/dL (ref 70–99)
Glucose-Capillary: 117 mg/dL — ABNORMAL HIGH (ref 70–99)

## 2022-01-19 LAB — HEPARIN LEVEL (UNFRACTIONATED): Heparin Unfractionated: 0.37 IU/mL (ref 0.30–0.70)

## 2022-01-19 LAB — MAGNESIUM: Magnesium: 1.5 mg/dL — ABNORMAL LOW (ref 1.7–2.4)

## 2022-01-19 LAB — APTT: aPTT: 88 seconds — ABNORMAL HIGH (ref 24–36)

## 2022-01-19 SURGERY — PERIPHERAL VASCULAR THROMBECTOMY
Anesthesia: Moderate Sedation | Laterality: Left

## 2022-01-19 MED ORDER — MORPHINE SULFATE (PF) 4 MG/ML IV SOLN: 4.0000 mg | Freq: Once | INTRAVENOUS | Status: DC | PRN

## 2022-01-19 MED ORDER — PIPERACILLIN-TAZOBACTAM 3.375 G IVPB
3.3750 g | Freq: Three times a day (TID) | INTRAVENOUS | Status: DC
  Administered 2022-01-19 – 2022-01-22 (×10): 3.375 g via INTRAVENOUS
  Filled 2022-01-19 (×10): qty 50

## 2022-01-19 MED ORDER — MAGNESIUM SULFATE 2 GM/50ML IV SOLN
2.0000 g | Freq: Once | INTRAVENOUS | Status: AC
  Administered 2022-01-19: 2 g via INTRAVENOUS
  Filled 2022-01-19: qty 50

## 2022-01-19 MED ORDER — SODIUM CHLORIDE 0.9 % IV SOLN
6.0000 mg/kg | Freq: Every day | INTRAVENOUS | Status: DC
  Administered 2022-01-19 – 2022-01-21 (×3): 450 mg via INTRAVENOUS
  Filled 2022-01-19 (×4): qty 9

## 2022-01-19 NOTE — Consult Note (Signed)
NAME: Jocelyn [name]  DOB: 31-Jul-1980  MRN: [id number]  Date/Time: 01/19/2022 6:28 PM  REQUESTING PROVIDER: Dr.Lai Subjective:  REASON FOR CONSULT: fever/gangrene ? Jocelyn [name] is a 41 y.o. female with a history of , HTN IDA, PAD on eliquis , h/o left lower extremity stent placement in July 2021 which was complicated by Stent thrombosis recently due to her not taking eliquis, underwent mechanical thrombectomy for it on 01/07/22 ( Left SFA) presented to the  ED on 01/17/22 with leg pain , blisters on her left leg and discoloration of the leg Vitals BP 142/91, temp 98.5, pulse 117 and sats 93% Wbc 13.6, Hb 8.2, PLT 651. [place] leg showed significant occlusive thrombus from popliteal vein to common femoral vein She was started on IV heparin and seen by vascular who  Placed rt femoral central  line and did angio and attempted penumbra thrombectomy of the left SFA and she got throbolytic therapy with catheter On 01/18/22 Dr.Dew took her back for angio and did mechanical thrombectomy of left SFA, popliteal arteries, TPA, PTA. Balloon angioplasty of left PTA and TP trunk Pt has had fever of 103 around 7pm and spiked against his morning to 103, wbc went upto 20 Blood culture sent and she has been started on zosyn  Pt denies any cough, sob, chest pain, nausea , vomiting , diarrhea. No pain abdomen She has a foley catheter   Past Medical History:  Diagnosis Date   IDA (iron deficiency anemia) 12/17/2019   HTN PAD  Past Surgical History:  Procedure Laterality Date   APPENDECTOMY     LOWER EXTREMITY ANGIOGRAPHY Left 01/11/2020   Procedure: Lower Extremity Angiography;  Surgeon: Annice Needy, MD;  Location: [facility];  Service: Cardiovascular;  Laterality: Left;   LOWER EXTREMITY ANGIOGRAPHY Left 03/06/2020   Procedure: Lower Extremity Angiography;  Surgeon: Annice Needy, MD;  Location: [facility];  Service: Cardiovascular;  Laterality: Left;   LOWER EXTREMITY ANGIOGRAPHY Left 03/07/2020    Procedure: Lower Extremity Angiography;  Surgeon: Annice Needy, MD;  Location: [facility];  Service: Cardiovascular;  Laterality: Left;   LOWER EXTREMITY ANGIOGRAPHY Left 01/06/2022   Procedure: Lower Extremity Angiography;  Surgeon: Annice Needy, MD;  Location: [facility];  Service: Cardiovascular;  Laterality: Left;   LOWER EXTREMITY ANGIOGRAPHY Left 01/07/2022   Procedure: Lower Extremity Angiography;  Surgeon: Annice Needy, MD;  Location: [facility];  Service: Cardiovascular;  Laterality: Left;   LOWER EXTREMITY ANGIOGRAPHY Left 01/18/2022   Procedure: Lower Extremity Angiography;  Surgeon: Annice Needy, MD;  Location: [facility];  Service: Cardiovascular;  Laterality: Left;   LOWER EXTREMITY INTERVENTION Bilateral 01/17/2022   Procedure: LOWER EXTREMITY INTERVENTION;  Surgeon: Learta Codding, MD;  Location: [facility];  Service: Cardiovascular;  Laterality: Bilateral;    Social History   Socioeconomic History   Marital status: Legally Separated    Spouse name: Not on file   Number of children: Not on file   Years of education: Not on file   Highest education level: Not on file  Occupational History   Not on file  Tobacco Use   Smoking status: Every Day    Packs/day: 0.50    Types: Cigarettes   Smokeless tobacco: Never  Vaping Use   Vaping Use: Never used  Substance and Sexual Activity   Alcohol use: Yes   Drug use: Never   Sexual activity: Not on file  Other Topics Concern   Not on file  Social History Narrative   Not on file   Social Determinants of Health   Financial Resource Strain: Not on file  Food Insecurity: Not on file  Transportation Needs: Not on file  Physical Activity: Not on file  Stress: Not on file  Social Connections: Not on file  Intimate Partner Violence: Not on file    Family History  Problem Relation Age of Onset   Hypertension Mother    Hypertension Sister    Hypertension Brother    No Known  Allergies I? Current Facility-Administered Medications  Medication Dose Route Frequency Provider Last Rate Last Admin   0.9 %  sodium chloride infusion (Manually program via Guardrails IV Fluids)   Intravenous Once Algernon Huxley, MD       0.9 %  sodium chloride infusion   Intravenous PRN Algernon Huxley, MD   Stopped at 01/17/22 1835   0.9 %  sodium chloride infusion   Intravenous Continuous Dew, Erskine Squibb, MD   Stopped at 01/19/22 1350   acetaminophen (TYLENOL) tablet 650 mg  650 mg Oral Q6H PRN Algernon Huxley, MD   650 mg at 01/19/22 0756   Or   acetaminophen (TYLENOL) suppository 650 mg  650 mg Rectal Q6H PRN Algernon Huxley, MD       amLODipine (NORVASC) tablet 5 mg  5 mg Oral Daily Algernon Huxley, MD   5 mg at 01/19/22 [id number]   Chlorhexidine Gluconate Cloth 2 % PADS 6 each  6 each Topical Q0600 Algernon Huxley, MD   6 each at 01/19/22 0451   diphenhydrAMINE (BENADRYL) injection 12.5 mg  12.5 mg Intravenous Q6H PRN Algernon Huxley, MD       Or   diphenhydrAMINE (BENADRYL) 12.5 MG/5ML elixir 12.5 mg  12.5 mg Oral Q6H PRN Algernon Huxley, MD       heparin ADULT infusion 100 units/mL (25000 units/275mL)  1,650 Units/hr Intravenous Continuous Algernon Huxley, MD 16.5 mL/hr at 01/19/22 1517 1,650 Units/hr at 01/19/22 1517   HYDROcodone-acetaminophen (NORCO/VICODIN) 5-325 MG per tablet 1-2 tablet  1-2 tablet Oral Q4H PRN Algernon Huxley, MD   2 tablet at 01/19/22 1350   insulin aspart (novoLOG) injection 0-15 Units  0-15 Units Subcutaneous TID WC Algernon Huxley, MD   2 Units at 01/18/22 0752   insulin aspart (novoLOG) injection 0-5 Units  0-5 Units Subcutaneous QHS Algernon Huxley, MD       morphine (PF) 2 MG/ML injection 2 mg  2 mg Intravenous Q3H PRN Algernon Huxley, MD   2 mg at 01/19/22 1737   morphine (PF) 4 MG/ML injection 4 mg  4 mg Intravenous Once PRN Enzo Bi, MD       morphine 100mg  in NS 159mL (1mg /mL) infusion - premix  2.6 mg/hr Intravenous Continuous Algernon Huxley, MD 2.6 mL/hr at 01/19/22 1517 2.6 mg/hr at 01/19/22  1517   naloxone (NARCAN) injection 0.4 mg  0.4 mg Intravenous PRN Algernon Huxley, MD       And   sodium chloride flush (NS) 0.9 % injection 9 mL  9 mL Intravenous PRN Algernon Huxley, MD       ondansetron (ZOFRAN) tablet 4 mg  4 mg Oral Q6H PRN Algernon Huxley, MD       Or   ondansetron (ZOFRAN) injection 4 mg  4 mg Intravenous Q6H PRN Algernon Huxley, MD  ondansetron (ZOFRAN) injection 4 mg  4 mg Intravenous Q6H PRN Annice Needy, MD       Oral care mouth rinse  15 mL Mouth Rinse PRN Wyn Quaker, Marlow Baars, MD       piperacillin-tazobactam (ZOSYN) IVPB 3.375 g  3.375 g Intravenous Pearletha Alfred, MD 12.5 mL/hr at 01/19/22 1517 Infusion Verify at 01/19/22 1517     Abtx:  Anti-infectives (From admission, onward)    Start     Dose/Rate Route Frequency Ordered Stop   01/19/22 1400  piperacillin-tazobactam (ZOSYN) IVPB 3.375 g        3.375 g 12.5 mL/hr over 240 Minutes Intravenous Every 8 hours 01/19/22 1036     01/18/22 1600  ceFAZolin (ANCEF) IVPB 2g/100 mL premix        2 g 200 mL/hr over 30 Minutes Intravenous  Once 01/18/22 1553 01/18/22 1753   01/17/22 0455  ceFAZolin (ANCEF) IVPB 1 g/50 mL premix        over 30 Minutes  Continuous PRN 01/17/22 0455 01/17/22 0607       REVIEW OF SYSTEMS:  Const:  fever, sweats, negative weight loss Eyes: negative diplopia or visual changes, negative eye pain ENT: negative coryza, negative sore throat Resp: negative cough, hemoptysis, dyspnea Cards: negative for chest pain, palpitations, lower extremity edema GU: negative for frequency, dysuria and hematuria GI: Negative for abdominal pain, diarrhea, bleeding, constipation Skin: negative for rash and pruritus Heme: negative for easy bruising and gum/nose bleeding MS: left leg pain, swelling Neurolo:negative for headaches, dizziness, vertigo, memory problems  Psych: negative for feelings of anxiety, depression  Endocrine: negative for thyroid, diabetes Allergy/Immunology- none Objective:  VITALS:  BP  112/71   Pulse (!) 123   Temp 98.3 F (36.8 C) (Oral)   Resp 19   Ht 5\' 5"  (1.651 m)   Wt 96.3 kg   LMP 01/05/2022 (Exact Date)   SpO2 94%   BMI 35.33 kg/m  LDA Rt femoral line PHYSICAL EXAM:  General: Alert, cooperative, no distress,  Head: Normocephalic, without obvious abnormality, atraumatic. Eyes: Conjunctivae clear, anicteric sclerae. Pupils are equal ENT Nares normal. No drainage or sinus tenderness. Lips, mucosa, and tongue normal. No Thrush Neck: Supple, symmetrical, no adenopathy, thyroid: non tender no carotid bruit and no JVD. Back: did not examine Lungs: b/l air entry Heart: tachycardia Abdomen: Soft, non-tender,not distended. Bowel sounds normal. No masses Extremities: rt femoral central line Left leg swollen till the thigh Bluish discoloration of the left foot with blisters over ankle     Skin: as baove Lymph: Cervical, supraclavicular normal. Neurologic: Grossly non-focal Pertinent Labs Lab Results CBC    Component Value Date/Time   WBC 20.3 (H) 01/19/2022 0132   RBC 3.45 (L) 01/19/2022 0132   HGB 7.4 (L) 01/19/2022 0132   HGB 11.7 (L) 04/24/2013 1557   HCT 26.1 (L) 01/19/2022 0132   HCT 35.6 04/24/2013 1557   PLT 320 01/19/2022 0132   PLT 316 04/24/2013 1557   MCV 75.7 (L) 01/19/2022 0132   MCV 81 04/24/2013 1557   MCH 21.4 (L) 01/19/2022 0132   MCHC 28.4 (L) 01/19/2022 0132   RDW 26.4 (H) 01/19/2022 0132   RDW 17.5 (H) 04/24/2013 1557   LYMPHSABS 1.8 01/17/2022 0116   MONOABS 0.8 01/17/2022 0116   EOSABS 0.2 01/17/2022 0116   BASOSABS 0.1 01/17/2022 0116       Latest Ref Rng & Units 01/19/2022    1:32 AM 01/18/2022    5:15 AM 01/17/2022  9:28 AM  CMP  Glucose 70 - 99 mg/dL 103  172    BUN 6 - 20 mg/dL 7  8    Creatinine 0.44 - 1.00 mg/dL 0.59  0.63  0.66   Sodium 135 - 145 mmol/L 137  137    Potassium 3.5 - 5.1 mmol/L 3.6  3.6    Chloride 98 - 111 mmol/L 106  107    CO2 22 - 32 mmol/L 24  23    Calcium 8.9 - 10.3 mg/dL 8.2  8.5         Microbiology:BC -pending  IMAGING RESULTS:  I have personally reviewed the films ?No inflitrate Impression/Recommendation 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 Foley in place since 7/9-no retention or AKI- may consider removing it ___________________________________________________ Discussed with patient,and her nurse Note:  This document was prepared using Dragon voice recognition software and may include unintentional dictation errors.

## 2022-01-19 NOTE — Progress Notes (Signed)
PROGRESS NOTE    Jocelyn [name]  JHE:[id number] DOB: [birth year] DOA: 01/17/2022 PCP: [facility]  [id number]  LOS: 2 days   Brief hospital course: No notes on file  Assessment & Plan: Jocelyn [name] is a 41 y.o. female with medical history significant of DM, HTN, tobacco use disorder, iron deficiency anemia, peripheral arterial disease on Eliquis with history of left lower extremity stent placement in July 2021 complicated by in-stent thrombosis requiring thrombolytic therapy and more recently on 01/07/2022 undergoing mechanical thrombectomy of the left SFA with angioplasty who presents to the ED with a 2-day history of severe pain of the left leg associated with the appearance of blisters on the leg,.  She also has shortness of breath starting today but without chest pain.  She has been compliant with her Eliquis.   * Critical limb ischemia of left lower extremity (HCC) PAD with recurrent left leg arterial occlusion from the SFA origin down --started on Alteplase, d/c'ed on 7/10. --per Dr. Wyn Quaker, Patient has irreversible ischemia and not unreconstructable arterial insufficiency with no outflow and essentially no flow below the the lower thigh.  Her leg is clearly not salvageable at this point and she will require an above-knee amputation.  without amputation, this is a fatal and life ending problem likely within the next week or 2.  Pt currently still refuses amputation. Plan: --continuous morphine with IV morphine PRN and Norco PRN, can increase rate of continuous morphine if needing frequent PRN's. --continue discussion about amputation  Acute deep vein thrombosis (DVT) of left femoral vein (HCC) s/p angioplasty and thrombectomy on 01/07/2022  Questionable compliance with anticoagulant at home. --tense edema in LLE --started on heparin gtt --Dr. Wyn Quaker rec venous thrombectomy to help reduce the swelling and improving healing, however, pt refused the procedure  today Plan: --cont heparin gtt   IDA (iron deficiency anemia) --1u pRBC for Hgb 7.0 on 7/9 --transfuse to keep Hgb >8  Acute urinary retention --Foley placed on 7/9 for post-void >700 ml. --cont foley for now and perform voiding trial in about a week.  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HCC) --secondary to acute ischemia due to arterial thrombosis left leg.  Likely the cause of pt's fever and leukocytosis. --ID consulted Plan: --blood cx --start zosyn and daptomycin today  Fever --recurrent and up to 103, likely due to wet gangrene, however, pt also has a central line and multiple angio procedures in the past 48 hrs, so will cover for MRSA. --ID consulted Plan: --start zosyn and daptomycin --f/u blood cx  Hypomagnesemia --monitor and replete PRN  HTN (hypertension) Cont home amlodipine   Diabetes mellitus without complication (HCC) Sliding scale insulin coverage    DVT prophylaxis: [id number] gtt Code Status: Full code  Family Communication: mother and grandmother updated at bedside today Level of care: Stepdown Dispo:   The patient is from: home Anticipated d/c is to: home Anticipated d/c date is: > 3 days Patient currently is not medically ready to d/c due to: wet gangrene which is life-threatening    Subjective and Interval History:  Vascular Dr. Wyn Quaker determined today that pt's LLE is not salvageable, and had planned for thrombectomy first (to lessen swelling) and amputation soon after, however, pt adamantly refused all procedures and surgeries this morning and said she was just going to go home.    Family was considering taking pt to [facility] ED to be admitted and get a 2nd opinion, however, later decided to stay, and pt may be open to at least  thrombectomy now.  Due to fever and gangrene developing, pt was started on abx and ID consulted.   Objective: Vitals:   01/19/22 1350 01/19/22 1700 01/19/22 1800 01/19/22 1900  BP:      Pulse:  (!) 117 (!) 116 (!) 115  Resp:  18 14 18    Temp: 98.3 F (36.8 C)     TempSrc: Oral     SpO2:  100% 99% 96%  Weight:      Height:        Intake/Output Summary (Last 24 hours) at 01/19/2022 2040 Last data filed at 01/19/2022 1517 Gross per 24 hour  Intake 1503.39 ml  Output 900 ml  Net 603.39 ml   Filed Weights   01/17/22 0112 01/17/22 0700  Weight: 81.6 kg 96.3 kg    Examination:   Constitutional: NAD, AAOx3 HEENT: conjunctivae and lids normal, EOMI CV: No cyanosis.   RESP: normal respiratory effort, on RA Extremities: LLE with tense swelling and blisters, gangrenous left toes Neuro: II - XII grossly intact.   Psych: depressed mood and affect.     Data Reviewed: I have personally reviewed labs and imaging studies  Time spent: 80 minutes  03/20/22, MD [facility] If 7PM-7AM, please contact night-coverage 01/19/2022, 8:40 PM

## 2022-01-19 NOTE — H&P (View-Only) (Signed)
[facility]  Daily Progress Note   Subjective  -   Patient remains in left lower extremity pain.  Having significant fevers likely from her gangrenous leg.  Has non-reconstructable arterial insufficiency and a massive left lower extremity DVT as well.  Patient needs an amputation at this point and would benefit from venous intervention to reduce swelling and promote healing which she is currently refusing.  Objective Vitals:   01/19/22 0346 01/19/22 0700 01/19/22 0800 01/19/22 0930  BP: 123/83 112/71    Pulse: (!) 123 (!) 123    Resp: 19 19    Temp: 99.7 F (37.6 C)  (!) 102.6 F (39.2 C) 99.7 F (37.6 C)  TempSrc: Oral  Oral Oral  SpO2: 96% 94%    Weight:      Height:        Intake/Output Summary (Last 24 hours) at 01/19/2022 0951 Last data filed at 01/19/2022 0800 Gross per 24 hour  Intake 847.07 ml  Output 1000 ml  Net -152.93 ml    PULM  CTAB CV  tachycardic VASC  left foot is mottled and cold and she is profoundly ischemic in the entire lower leg which has developed gangrenous changes.  3+ to 4+ left lower extremity swelling is present as well.  Laboratory CBC    Component Value Date/Time   WBC 20.3 (H) 01/19/2022 0132   HGB 7.4 (L) 01/19/2022 0132   HGB 11.7 (L) 04/24/2013 1557   HCT 26.1 (L) 01/19/2022 0132   HCT 35.6 04/24/2013 1557   PLT 320 01/19/2022 0132   PLT 316 04/24/2013 1557    BMET    Component Value Date/Time   NA 137 01/19/2022 0132   NA 134 (L) 04/24/2013 1557   K 3.6 01/19/2022 0132   K 4.4 04/24/2013 1557   CL 106 01/19/2022 0132   CL 105 04/24/2013 1557   CO2 24 01/19/2022 0132   CO2 24 04/24/2013 1557   GLUCOSE 103 (H) 01/19/2022 0132   GLUCOSE 103 (H) 04/24/2013 1557   BUN 7 01/19/2022 0132   BUN 7 04/24/2013 1557   CREATININE 0.59 01/19/2022 0132   CREATININE 0.51 (L) 04/24/2013 1557   CALCIUM 8.2 (L) 01/19/2022 0132   CALCIUM 9.1 04/24/2013 1557   GFRNONAA >60 01/19/2022 0132   GFRNONAA >60  04/24/2013 1557   GFRAA >60 03/09/2020 0438   GFRAA >60 04/24/2013 1557    Assessment/Planning: POD #1/2 s/p left lower extremity angiogram and intervention for ischemia  Patient has irreversible ischemia and not unreconstructable arterial insufficiency with no outflow and essentially no flow below the the lower thigh. This is complicated by severe left lower extremity DVT causing massive leg swelling and impeding venous return Her leg is clearly not salvageable at this point and she will require an above-knee amputation I am concerned about her healing from a leg swelling and venous return standpoint of recommended venous thrombectomy be done as well. Patient is refusing all procedures at this point says she does not want to have anything done I discussed with her that without amputation, this is a fatal and life ending problem likely within the next week or 2.  She very calmly says she is not going to have an amputation.  At this point, if she is felt to have decision-making capacity which she seems to have, I would recommend hospice services as this is clearly a terminal problem without amputation.  I have asked the patient to consider this seriously because soon she  will start to develop myoglobin and renal insufficiency from product breakdown and has already began having fevers likely from her gangrenous lower extremity. Patient had significant noncompliance with continued tobacco use and not taking her anticoagulants initially several weeks ago and questionably taking them recently. This is a very difficult situation and likely without amputation will progress to mortality in the near future.  [name]  01/19/2022, 9:51 AM

## 2022-01-19 NOTE — Progress Notes (Signed)
ANTICOAGULATION CONSULT NOTE  Pharmacy Consult for Heparin Indication: DVT and limb ischemia  No Known Allergies  Patient Measurements: Height: 5\' 5"  (165.1 cm) Weight: 96.3 kg (212 lb 4.9 oz) IBW/kg (Calculated) : 57 Heparin Dosing Weight: 74.4 kg   Vital Signs: Temp: 100.1 F (37.8 C) (07/10 2300) Temp Source: Oral (07/10 2300) BP: 124/76 (07/10 2300) Pulse Rate: 121 (07/10 2300)  Labs: Recent Labs    01/17/22 0116 01/17/22 0401 01/17/22 0928 01/17/22 1815 01/17/22 2030 01/18/22 0235 01/18/22 0515 01/18/22 0801 01/19/22 0132  HGB 7.4*  --  7.0*  --  8.2*  --  8.1*  --  7.4*  HCT 27.6*  --  25.5*  --  28.5*  --  28.5*  --  26.1*  PLT 800*  --  651*  --   --   --  429*  --  320  APTT 30  --  41* 42*  --  51*  --  104* 88*  LABPROT 14.7  --   --   --   --   --   --   --   --   INR 1.2  --   --   --   --   --   --   --   --   HEPARINUNFRC >1.10*  --   --  0.81*  --   --   --  0.50 0.37  CREATININE 0.71  --  0.66  --   --   --  0.63  --   --   TROPONINIHS 7 7  --   --   --   --   --   --   --      Estimated Creatinine Clearance: 105.1 mL/min (by C-G formula based on SCr of 0.63 mg/dL).   Medical History: Past Medical History:  Diagnosis Date   IDA (iron deficiency anemia) 12/17/2019    PTA: Eliquis 5mg  BID (Last dose  on 7/8 @ 1800) Inpatient: Heparin drip  Allergies: NKDA  Assessment: 41yo female with hx of PAD, multiple DVT's, stent placement presents to ED via EMS with complaints of LLE pain and swelling. Pt underwent thrombectomy and revascularization of LLE on 6/29 and was started on Eliquis 5 mg PO BID on 6/30. Pharmacy consulted to dose heparin for DVT/limb ischemia.   She is also receiving alteplase 0.02 mg/mL at 0.5 mg/hr via infusion catheter to improve inflow   Goal of Therapy:  Heparin level 0.3-0.7 units/ml aPTT 66 - 102 seconds Monitor platelets by anticoagulation protocol: Yes  Date Time aPTT/HL Rate/Comment 0709 0116 30 /  >1.10 0709 0928 41 / ---  Subtherapeutic 0709 1815 42 / 0.81 Subtherapeutic 0710    0235    51                   Subtherapeutic  0710 0801 104/ 0.50 0711 0132 88/0.37 Therapeutic  Plan:  Continue heparin infusion at 1650 units/hr.  aPTT & HL now correlating Recheck HL daily w/ AM labs while therapeutic  Continue to monitor H&H and platelets daily while on heparin gtt.  0117, PharmD, [facility] 01/19/2022 2:15 AM

## 2022-01-19 NOTE — Evaluation (Signed)
Physical Therapy Evaluation Patient Details Name: Jocelyn [name] MRN: [id number] DOB: May 10, 1981 Today's Date: 01/19/2022  History of Present Illness  Patient is a 41 year old female with continued rest pain of the left foot and now with gangrenous changes to her left foot with history of multiple vascular procedures. Findings from vascular procedure from 7/10 reports continued occlusion of the left SFA, popliteal artery, and all 3 tibial vessels with no runoff seen distally on initial imaging or after attempts at intervention with further intervention recommended. Patient refusing further surgical intervention at this time.  Clinical Impression  Patient withdrawn and needs encouragement to participate with any mobility efforts. Activity tolerance limited by left leg pain, fatigue with minimal activity. She did agree to bed mobility and elevating head of bed. She tolerated the head of bed elevated to ~ 30 degrees before complaining of increased left leg pain.  She needs assistance for rolling in bed. Heart rate up to 130's with simple bed level activity and rest breaks required intermittently due to fatigue with activity. She did request to be washed up and she participated with a partial bed level bath with multiple rest breaks required due to fatigue.  The patient is requesting to return home with a hospital bed and would need EMS transportation as she lives in a second floor apartment with stairs to enter. She has a rolling walker and a 3-in-1 in place already at home. She would likely require physical assistance with all mobility at this point. PT will follow up as appropriate pending goals of care, further medical interventions, etc.      Recommendations for follow up therapy are one component of a multi-disciplinary discharge planning process, led by the attending physician.  Recommendations may be updated based on patient status, additional functional criteria and insurance authorization.  Follow  Up Recommendations Follow physician's recommendations for discharge plan and follow up therapies Can patient physically be transported by private vehicle: No    Assistance Recommended at Discharge Frequent or constant Supervision/Assistance  Patient can return home with the following  A lot of help with walking and/or transfers;A lot of help with bathing/dressing/bathroom;Assist for transportation;Help with stairs or ramp for entrance    Equipment Recommendations Hospital bed  Recommendations for Other Services       Functional Status Assessment Patient has had a recent decline in their functional status and demonstrates the ability to make significant improvements in function in a reasonable and predictable amount of time.     Precautions / Restrictions Precautions Precautions: Fall      Mobility  Bed Mobility Overal bed mobility: Needs Assistance Bed Mobility: Rolling Rolling: Min assist         General bed mobility comments: patient declined sitting up on the edge of bed. she did agree to perform bed mobility with rolling multiple times. cues for techniques to increase independence with bed mobility. pain in left leg reported with repositioning. educated patient on importance of routine repositioning to prevent against skin breakdown    Transfers                        Ambulation/Gait                  Stairs            Wheelchair Mobility    Modified Rankin (Stroke Patients Only)       Balance  Pertinent Vitals/Pain Pain Assessment Pain Assessment: Faces Faces Pain Scale: Hurts whole lot Pain Location: LLE Pain Descriptors / Indicators: Aching, Constant, Grimacing, Tender Pain Intervention(s): Limited activity within patient's tolerance, Monitored during session, Patient requesting pain meds-RN notified    Home Living Family/patient expects to be discharged to:: Private  residence Living Arrangements: Children Available Help at Discharge: Family;Available PRN/intermittently Type of Home: Apartment Home Access: Stairs to enter Entrance Stairs-Rails: Left Entrance Stairs-Number of Steps: 11 w/ landing in halfway   Home Layout: One level Home Equipment: Agricultural consultant (2 wheels);BSC/3in1      Prior Function Prior Level of Function : Independent/Modified Independent             Mobility Comments: limited walking, has been using rolling walker ADLs Comments: independent     Hand Dominance        Extremity/Trunk Assessment   Upper Extremity Assessment Upper Extremity Assessment: Overall WFL for tasks assessed    Lower Extremity Assessment Lower Extremity Assessment: RLE deficits/detail;LLE deficits/detail RLE Deficits / Details: AROM is grossly WFL. endurance likely impaired for sustained activity LLE Deficits / Details: swelling noted. no activ emovement noted, however unclear if patient providing full effort due to pain/fear of pain with movement. LLE: Unable to fully assess due to pain LLE Sensation: decreased light touch (absent light touch on the bottom of the foot. decreased light touch distally> proximally)       Communication   Communication: No difficulties  Cognition Arousal/Alertness: Awake/alert Behavior During Therapy: Flat affect Overall Cognitive Status: Within Functional Limits for tasks assessed                                 General Comments: she is withdrawn initially, not making eye contact with minimal verbalizations. she is able to follow all commands without difficulty        General Comments General comments (skin integrity, edema, etc.): elevated head of bed to 30 degrees and patient did not tolerate further moblity due to leg discomfort. heart rate in the 130's with repositioning and bed mobility. she participated in partial bathing with set-up. she is fatigued with minimal activity and required  multiple rest breaks    Exercises     Assessment/Plan    PT Assessment Patient needs continued PT services  PT Problem List Decreased strength;Decreased range of motion;Decreased activity tolerance;Decreased balance;Decreased mobility;Decreased skin integrity;Pain;Impaired sensation;Cardiopulmonary status limiting activity       PT Treatment Interventions DME instruction;Gait training;Stair training;Functional mobility training;Therapeutic activities;Therapeutic exercise;Balance training;Neuromuscular re-education;Cognitive remediation;Patient/family education;Wheelchair mobility training    PT Goals (Current goals can be found in the Care Plan section)  Acute Rehab PT Goals Patient Stated Goal: to return home PT Goal Formulation: With patient Time For Goal Achievement: 02/02/22 Potential to Achieve Goals: Poor    Frequency Min 2X/week (trial)     Co-evaluation               AM-PAC PT "6 Clicks" Mobility  Outcome Measure Help needed turning from your back to your side while in a flat bed without using bedrails?: A Little Help needed moving from lying on your back to sitting on the side of a flat bed without using bedrails?: A Little Help needed moving to and from a bed to a chair (including a wheelchair)?: A Lot Help needed standing up from a chair using your arms (e.g., wheelchair or bedside chair)?: A Lot Help needed to walk in hospital  room?: A Lot Help needed climbing 3-5 steps with a railing? : Total 6 Click Score: 13    End of Session   Activity Tolerance: Patient limited by pain;Patient limited by fatigue (limited by willingness to participate with progression of mobility) Patient left: in bed;with call bell/phone within reach;with family/visitor present Nurse Communication: Mobility status;Patient requests pain meds PT Visit Diagnosis: Difficulty in walking, not elsewhere classified (R26.2);Muscle weakness (generalized) (M62.81);Pain Pain - Right/Left:  Left Pain - part of body: Leg    Time: [id number] PT Time Calculation (min) (ACUTE ONLY): 47 min   Charges:   PT Evaluation $PT Eval Moderate Complexity: 1 Mod PT Treatments $Therapeutic Activity: 8-22 mins        Minna Merritts, PT, MPT   Percell Locus 01/19/2022, 12:14 PM

## 2022-01-19 NOTE — Progress Notes (Signed)
Patient verbalized to this RN she will not be having any more surgery. I called [facility] and gave information to [facility] who said she will remove patient from schedule and update Dr Wyn Quaker. Regular diet order renewed for patient.

## 2022-01-19 NOTE — Progress Notes (Signed)
Jocelyn [name]  Daily Progress Note   Subjective  -   Patient remains in left lower extremity pain.  Having significant fevers likely from her gangrenous leg.  Has non-reconstructable arterial insufficiency and a massive left lower extremity DVT as well.  Patient needs an amputation at this point and would benefit from venous intervention to reduce swelling and promote healing which she is currently refusing.  Objective Vitals:   01/19/22 0346 01/19/22 0700 01/19/22 0800 01/19/22 0930  BP: 123/83 112/71    Pulse: (!) 123 (!) 123    Resp: 19 19    Temp: 99.7 F (37.6 C)  (!) 102.6 F (39.2 C) 99.7 F (37.6 C)  TempSrc: Oral  Oral Oral  SpO2: 96% 94%    Weight:      Height:        Intake/Output Summary (Last 24 hours) at 01/19/2022 0951 Last data filed at 01/19/2022 0800 Gross per 24 hour  Intake 847.07 ml  Output 1000 ml  Net -152.93 ml    PULM  CTAB CV  tachycardic VASC  left foot is mottled and cold and she is profoundly ischemic in the entire lower leg which has developed gangrenous changes.  3+ to 4+ left lower extremity swelling is present as well.  Laboratory CBC    Component Value Date/Time   WBC 20.3 (H) 01/19/2022 0132   HGB 7.4 (L) 01/19/2022 0132   HGB 11.7 (L) 04/24/2013 1557   HCT 26.1 (L) 01/19/2022 0132   HCT 35.6 04/24/2013 1557   PLT 320 01/19/2022 0132   PLT 316 04/24/2013 1557    BMET    Component Value Date/Time   NA 137 01/19/2022 0132   NA 134 (L) 04/24/2013 1557   K 3.6 01/19/2022 0132   K 4.4 04/24/2013 1557   CL 106 01/19/2022 0132   CL 105 04/24/2013 1557   CO2 24 01/19/2022 0132   CO2 24 04/24/2013 1557   GLUCOSE 103 (H) 01/19/2022 0132   GLUCOSE 103 (H) 04/24/2013 1557   BUN 7 01/19/2022 0132   BUN 7 04/24/2013 1557   CREATININE 0.59 01/19/2022 0132   CREATININE 0.51 (L) 04/24/2013 1557   CALCIUM 8.2 (L) 01/19/2022 0132   CALCIUM 9.1 04/24/2013 1557   GFRNONAA >60 01/19/2022 0132   GFRNONAA >60  04/24/2013 1557   GFRAA >60 03/09/2020 0438   GFRAA >60 04/24/2013 1557    Assessment/Planning: POD #1/2 s/p left lower extremity angiogram and intervention for ischemia  Patient has irreversible ischemia and not unreconstructable arterial insufficiency with no outflow and essentially no flow below the the lower thigh. This is complicated by severe left lower extremity DVT causing massive leg swelling and impeding venous return Her leg is clearly not salvageable at this point and she will require an above-knee amputation I am concerned about her healing from a leg swelling and venous return standpoint of recommended venous thrombectomy be done as well. Patient is refusing all procedures at this point says she does not want to have anything done I discussed with her that without amputation, this is a fatal and life ending problem likely within the next week or 2.  She very calmly says she is not going to have an amputation.  At this point, if she is felt to have decision-making capacity which she seems to have, I would recommend hospice services as this is clearly a terminal problem without amputation.  I have asked the patient to consider this seriously because soon she  will start to develop myoglobin and renal insufficiency from product breakdown and has already began having fevers likely from her gangrenous lower extremity. Patient had significant noncompliance with continued tobacco use and not taking her anticoagulants initially several weeks ago and questionably taking them recently. This is a very difficult situation and likely without amputation will progress to mortality in the near future.  Jocelyn [name]  01/19/2022, 9:51 AM      

## 2022-01-19 NOTE — Assessment & Plan Note (Addendum)
   secondary to acute ischemia due to arterial thrombosis left leg.  Likely the cause of pt's fever and leukocytosis.  ID following, deesclaate/discontinue abx per their recs

## 2022-01-19 NOTE — Consult Note (Addendum)
Pharmacy Antibiotic Note  Jocelyn [name] is a 41 y.o. female with PMH including DM, HTN, tobacco use disorder, IDA, PAD on Eliquis, history of LLE stenting in 01/2020 c/b in-stent thrombosis requiring thrombolytic therapy and more recently mechanical thrombectomy of left SFA with angioplasty 01/07/2022 admitted on 01/17/2022 with  critical limb ischemia of LLE and acute DVT of left femoral vein . Since admission, patient has undergone endovascular procedure on 7/9 including left lower extremity angiogram with angioplasty and thrombectomy followed by catheter directed thrombolysis and then returned to OR 7/10 for repeat left lower extremity angiogram with mechanical thrombectomy and balloon angioplasty. ID is following for gangrenous leg and fevers. Pharmacy has been consulted for daptomycin dosing. Patient is also ordered Zosyn.  Plan:  Daptomycin 450 mg IV q24h --6 mg/kg AdjBW given BMI 35 --4.5 mg/kg TBW  Baseline CK pending. Will check weekly CK while on daptomycin  Height: 5\' 5"  (165.1 cm) Weight: 96.3 kg (212 lb 4.9 oz) IBW/kg (Calculated) : 57  Temp (24hrs), Avg:100.5 F (38.1 C), Min:98.3 F (36.8 C), Max:103.1 F (39.5 C)  Recent Labs  Lab 01/17/22 0116 01/17/22 0928 01/18/22 0515 01/19/22 0132  WBC 13.3* 13.6* 16.1* 20.3*  CREATININE 0.71 0.66 0.63 0.59    Estimated Creatinine Clearance: 105.1 mL/min (by C-G formula based on SCr of 0.59 mg/dL).    No Known Allergies  Antimicrobials this admission: Cefazolin 7/9 >> 7/10 (surgical ppx) Zosyn 7/11 >>  Daptomycin 7/11 >>   Dose adjustments this admission: N/A  Microbiology results: 7/11 BCx: pending 7/11 Superficial left leg Wcx: pending  Thank you for allowing pharmacy to be a part of this patient's care.  9/11 01/19/2022 6:51 PM

## 2022-01-19 NOTE — Assessment & Plan Note (Addendum)
Foley placed on 7/9 for post-void >700 ml.  Removed  Monitor bladder scan prn for retention  contienue I&O

## 2022-01-19 NOTE — Progress Notes (Signed)
Oral temp 102.6 Heat turned down to 70 from 85 extra blankets removed from patient 650 mg tylenol administered to patient.

## 2022-01-20 ENCOUNTER — Encounter
Admission: EM | Disposition: A | Discharge status: Rehabilitation Facility | Payer: Self-pay | Source: Home / Self Care | Attending: Hospitalist

## 2022-01-20 ENCOUNTER — Inpatient Hospital Stay: Payer: Medicaid Other | Admitting: General Practice

## 2022-01-20 DIAGNOSIS — I82412 Acute embolism and thrombosis of left femoral vein: Secondary | ICD-10-CM

## 2022-01-20 HISTORY — PX: AMPUTATION: SHX166

## 2022-01-20 HISTORY — PX: PERIPHERAL VASCULAR THROMBECTOMY: CATH118306

## 2022-01-20 LAB — BASIC METABOLIC PANEL
Anion gap: 4 — ABNORMAL LOW (ref 5–15)
BUN: 8 mg/dL (ref 6–20)
CO2: 25 mmol/L (ref 22–32)
Calcium: 8.4 mg/dL — ABNORMAL LOW (ref 8.9–10.3)
Chloride: 111 mmol/L (ref 98–111)
Creatinine, Ser: 0.66 mg/dL (ref 0.44–1.00)
GFR, Estimated: >60 mL/min (ref >60)
Glucose, Bld: 105 mg/dL — ABNORMAL HIGH (ref 70–99)
Potassium: 3.3 mmol/L — ABNORMAL LOW (ref 3.5–5.1)
Sodium: 140 mmol/L (ref 135–145)

## 2022-01-20 LAB — CBC
HCT: 25.4 % — ABNORMAL LOW (ref 36.0–46.0)
Hemoglobin: 7.1 g/dL — ABNORMAL LOW (ref 12.0–15.0)
MCH: 21.8 pg — ABNORMAL LOW (ref 26.0–34.0)
MCHC: 28 g/dL — ABNORMAL LOW (ref 30.0–36.0)
MCV: 77.9 fL — ABNORMAL LOW (ref 80.0–100.0)
Platelets: 342 10*3/uL (ref 150–400)
RBC: 3.26 MIL/uL — ABNORMAL LOW (ref 3.87–5.11)
RDW: 26.6 % — ABNORMAL HIGH (ref 11.5–15.5)
WBC: 17.6 10*3/uL — ABNORMAL HIGH (ref 4.0–10.5)
nRBC: 0 % (ref 0.0–0.2)

## 2022-01-20 LAB — GLUCOSE, CAPILLARY
Glucose-Capillary: 105 mg/dL — ABNORMAL HIGH (ref 70–99)
Glucose-Capillary: 106 mg/dL — ABNORMAL HIGH (ref 70–99)
Glucose-Capillary: 106 mg/dL — ABNORMAL HIGH (ref 70–99)
Glucose-Capillary: 136 mg/dL — ABNORMAL HIGH (ref 70–99)
Glucose-Capillary: 223 mg/dL — ABNORMAL HIGH (ref 70–99)
Glucose-Capillary: 98 mg/dL (ref 70–99)

## 2022-01-20 LAB — MAGNESIUM: Magnesium: 1.9 mg/dL (ref 1.7–2.4)

## 2022-01-20 LAB — HEPARIN LEVEL (UNFRACTIONATED)
Heparin Unfractionated: 0.26 IU/mL — ABNORMAL LOW (ref 0.30–0.70)
Heparin Unfractionated: 0.4 IU/mL (ref 0.30–0.70)

## 2022-01-20 LAB — PREPARE RBC (CROSSMATCH)

## 2022-01-20 SURGERY — AMPUTATION, ABOVE KNEE
Anesthesia: General | Site: Leg Upper | Laterality: Left

## 2022-01-20 MED ORDER — APIXABAN 5 MG PO TABS
5.0000 mg | ORAL_TABLET | Freq: Two times a day (BID) | ORAL | Status: DC
  Administered 2022-01-21 – 2022-01-25 (×9): 5 mg via ORAL
  Filled 2022-01-20 (×9): qty 1

## 2022-01-20 MED ORDER — DEXMEDETOMIDINE (PRECEDEX) IN NS 20 MCG/5ML (4 MCG/ML) IV SYRINGE
PREFILLED_SYRINGE | INTRAVENOUS | Status: DC | PRN
  Administered 2022-01-20: 4 ug via INTRAVENOUS
  Administered 2022-01-20 (×2): 8 ug via INTRAVENOUS

## 2022-01-20 MED ORDER — ONDANSETRON HCL 4 MG/2ML IJ SOLN
INTRAMUSCULAR | Status: AC
  Filled 2022-01-20: qty 2

## 2022-01-20 MED ORDER — ONDANSETRON HCL 4 MG/2ML IJ SOLN: 4.0000 mg | Freq: Four times a day (QID) | INTRAMUSCULAR | Status: DC | PRN

## 2022-01-20 MED ORDER — CHLORHEXIDINE GLUCONATE CLOTH 2 % EX PADS: 6.0000 | MEDICATED_PAD | Freq: Once | CUTANEOUS | Status: DC

## 2022-01-20 MED ORDER — POTASSIUM CHLORIDE 10 MEQ/100ML IV SOLN
10.0000 meq | Freq: Once | INTRAVENOUS | Status: AC
Start: 2022-01-20 — End: 2022-01-20
  Administered 2022-01-20: 10 meq via INTRAVENOUS
  Filled 2022-01-20: qty 100

## 2022-01-20 MED ORDER — FENTANYL CITRATE (PF) 100 MCG/2ML IJ SOLN
INTRAMUSCULAR | Status: DC | PRN
  Administered 2022-01-20 (×4): 50 ug via INTRAVENOUS

## 2022-01-20 MED ORDER — HEPARIN (PORCINE) 25000 UT/250ML-% IV SOLN
600.0000 [IU]/h | INTRAVENOUS | Status: AC
  Administered 2022-01-20: 600 [IU]/h via INTRAVENOUS

## 2022-01-20 MED ORDER — LIDOCAINE HCL (PF) 2 % IJ SOLN
INTRAMUSCULAR | Status: AC
  Filled 2022-01-20: qty 5

## 2022-01-20 MED ORDER — ROCURONIUM BROMIDE 100 MG/10ML IV SOLN
INTRAVENOUS | Status: DC | PRN
  Administered 2022-01-20: 40 mg via INTRAVENOUS

## 2022-01-20 MED ORDER — DEXAMETHASONE SODIUM PHOSPHATE 10 MG/ML IJ SOLN
INTRAMUSCULAR | Status: DC | PRN
  Administered 2022-01-20: 5 mg via INTRAVENOUS

## 2022-01-20 MED ORDER — SUGAMMADEX SODIUM 200 MG/2ML IV SOLN
INTRAVENOUS | Status: DC | PRN
  Administered 2022-01-20: 200 mg via INTRAVENOUS

## 2022-01-20 MED ORDER — FENTANYL CITRATE (PF) 100 MCG/2ML IJ SOLN
INTRAMUSCULAR | Status: AC
  Filled 2022-01-20: qty 2

## 2022-01-20 MED ORDER — MIDAZOLAM HCL 2 MG/2ML IJ SOLN
INTRAMUSCULAR | Status: DC | PRN
  Administered 2022-01-20: 2 mg via INTRAVENOUS

## 2022-01-20 MED ORDER — HYDROMORPHONE HCL 1 MG/ML IJ SOLN
INTRAMUSCULAR | Status: AC
  Filled 2022-01-20: qty 1

## 2022-01-20 MED ORDER — PROPOFOL 10 MG/ML IV BOLUS
INTRAVENOUS | Status: DC | PRN
  Administered 2022-01-20: 100 mg via INTRAVENOUS

## 2022-01-20 MED ORDER — DEXAMETHASONE SODIUM PHOSPHATE 10 MG/ML IJ SOLN
INTRAMUSCULAR | Status: AC
  Filled 2022-01-20: qty 1

## 2022-01-20 MED ORDER — SODIUM CHLORIDE 0.9 % IV SOLN: 10.0000 mL/h | Freq: Once | INTRAVENOUS | Status: DC

## 2022-01-20 MED ORDER — MIDAZOLAM HCL 2 MG/2ML IJ SOLN
INTRAMUSCULAR | Status: AC
  Filled 2022-01-20: qty 2

## 2022-01-20 MED ORDER — HYDROMORPHONE HCL 1 MG/ML IJ SOLN
INTRAMUSCULAR | Status: DC | PRN
  Administered 2022-01-20 (×2): .25 mg via INTRAVENOUS

## 2022-01-20 MED ORDER — POTASSIUM CHLORIDE CRYS ER 20 MEQ PO TBCR
20.0000 meq | EXTENDED_RELEASE_TABLET | Freq: Once | ORAL | Status: DC
Start: 2022-01-20 — End: 2022-01-20

## 2022-01-20 MED ORDER — CEFAZOLIN SODIUM-DEXTROSE 2-4 GM/100ML-% IV SOLN: 2.0000 g | INTRAVENOUS | Status: DC

## 2022-01-20 MED ORDER — HEPARIN BOLUS VIA INFUSION
1100.0000 [IU] | Freq: Once | INTRAVENOUS | Status: AC
  Administered 2022-01-20: 1100 [IU] via INTRAVENOUS
  Filled 2022-01-20: qty 1100

## 2022-01-20 MED ORDER — PROPOFOL 10 MG/ML IV BOLUS
INTRAVENOUS | Status: AC
  Filled 2022-01-20: qty 20

## 2022-01-20 MED ORDER — LIDOCAINE HCL (CARDIAC) PF 100 MG/5ML IV SOSY
PREFILLED_SYRINGE | INTRAVENOUS | Status: DC | PRN
  Administered 2022-01-20: 80 mg via INTRAVENOUS

## 2022-01-20 MED ORDER — HYDROMORPHONE HCL 1 MG/ML IJ SOLN: 1.0000 mg | Freq: Once | INTRAMUSCULAR | Status: DC | PRN

## 2022-01-20 MED ORDER — 0.9 % SODIUM CHLORIDE (POUR BTL) OPTIME
TOPICAL | Status: DC | PRN
  Administered 2022-01-20: 1000 mL

## 2022-01-20 MED ORDER — ROCURONIUM BROMIDE 10 MG/ML (PF) SYRINGE
PREFILLED_SYRINGE | INTRAVENOUS | Status: AC
  Filled 2022-01-20: qty 10

## 2022-01-20 MED ORDER — ONDANSETRON HCL 4 MG/2ML IJ SOLN
INTRAMUSCULAR | Status: DC | PRN
  Administered 2022-01-20: 4 mg via INTRAVENOUS

## 2022-01-20 SURGICAL SUPPLY — 41 items
APL PRP STRL LF DISP 70% ISPRP (MISCELLANEOUS) ×2
BLADE SAGITTAL WIDE XTHICK NO (BLADE) ×3 IMPLANT
BNDG CMPR STD VLCR NS LF 5.8X6 (GAUZE/BANDAGES/DRESSINGS) ×2
BNDG COHESIVE 4X5 TAN ST LF (GAUZE/BANDAGES/DRESSINGS) ×3 IMPLANT
BNDG ELASTIC 6X5.8 VLCR NS LF (GAUZE/BANDAGES/DRESSINGS) ×3 IMPLANT
BNDG GAUZE DERMACEA FLUFF (GAUZE/BANDAGES/DRESSINGS) ×3
BNDG GAUZE DERMACEA FLUFF 4 (GAUZE/BANDAGES/DRESSINGS) ×4 IMPLANT
BNDG GZE DERMACEA 4 6PLY (GAUZE/BANDAGES/DRESSINGS) ×6
BRUSH SCRUB EZ  4% CHG (MISCELLANEOUS) ×3
BRUSH SCRUB EZ 4% CHG (MISCELLANEOUS) ×2 IMPLANT
CATH EMBOLECTOMY 5X80 (CATHETERS) ×1 IMPLANT
CHLORAPREP W/TINT 26 (MISCELLANEOUS) ×3 IMPLANT
DRAPE INCISE IOBAN 66X45 STRL (DRAPES) ×1 IMPLANT
DRAPE INCISE IOBAN 66X60 STRL (DRAPES) ×3 IMPLANT
ELECT CAUTERY BLADE 6.4 (BLADE) ×3 IMPLANT
ELECT REM PT RETURN 9FT ADLT (ELECTROSURGICAL) ×3
ELECTRODE REM PT RTRN 9FT ADLT (ELECTROSURGICAL) ×2 IMPLANT
GAUZE XEROFORM 1X8 LF (GAUZE/BANDAGES/DRESSINGS) ×6 IMPLANT
GLOVE BIO SURGEON STRL SZ7 (GLOVE) ×3 IMPLANT
GOWN STRL REUS W/ TWL LRG LVL3 (GOWN DISPOSABLE) ×2 IMPLANT
GOWN STRL REUS W/ TWL XL LVL3 (GOWN DISPOSABLE) ×2 IMPLANT
GOWN STRL REUS W/TWL LRG LVL3 (GOWN DISPOSABLE) ×3
GOWN STRL REUS W/TWL XL LVL3 (GOWN DISPOSABLE) ×3
HANDLE YANKAUER SUCT BULB TIP (MISCELLANEOUS) ×3 IMPLANT
KIT TURNOVER KIT A (KITS) ×3 IMPLANT
LABEL OR SOLS (LABEL) ×3 IMPLANT
MANIFOLD NEPTUNE II (INSTRUMENTS) ×3 IMPLANT
NS IRRIG 1000ML POUR BTL (IV SOLUTION) ×3 IMPLANT
PACK EXTREMITY ARMC (MISCELLANEOUS) ×3 IMPLANT
PAD ABD DERMACEA PRESS 5X9 (GAUZE/BANDAGES/DRESSINGS) ×5 IMPLANT
PAD PREP 24X41 OB/GYN DISP (PERSONAL CARE ITEMS) ×3 IMPLANT
SPONGE T-LAP 18X18 ~~LOC~~+RFID (SPONGE) ×8 IMPLANT
STAPLER SKIN PROX 35W (STAPLE) ×3 IMPLANT
STOCKINETTE M/LG 89821 (MISCELLANEOUS) ×3 IMPLANT
SUT ETHILON 2 0 FS 18 (SUTURE) ×1 IMPLANT
SUT SILK 2 0 (SUTURE) ×3
SUT SILK 2 0 SH (SUTURE) ×6 IMPLANT
SUT SILK 2-0 18XBRD TIE 12 (SUTURE) ×2 IMPLANT
SUT VIC AB 0 CT1 36 (SUTURE) ×6 IMPLANT
SUT VIC AB 2-0 CT1 (SUTURE) ×6 IMPLANT
WATER STERILE IRR 500ML POUR (IV SOLUTION) ×3 IMPLANT

## 2022-01-20 NOTE — Transfer of Care (Signed)
Immediate Anesthesia Transfer of Care Note  Patient: Jocelyn [name]  Procedure(s) Performed: AMPUTATION ABOVE KNEE (Left: Knee) PERIPHERAL VASCULAR THROMBECTOMY Left Femoral vein (Left: Leg Upper)  Patient Location: ICU  Anesthesia Type:General  Level of Consciousness: awake, drowsy and patient cooperative  Airway & Oxygen Therapy: Patient Spontanous Breathing and Patient connected to face mask oxygen  Post-op Assessment: Report given to RN and Post -op Vital signs reviewed and stable  Post vital signs: Reviewed and stable  Last Vitals:  Vitals Value Taken Time  BP 112/70 01/20/22 1839  Temp    Pulse 114 01/20/22 1845  Resp 26 01/20/22 1845  SpO2 100 % 01/20/22 1845  Vitals shown include unvalidated device data.  Last Pain:  Vitals:   01/20/22 1600  TempSrc:   PainSc: 5       Patients Stated Pain Goal: 0 (01/18/22 1910)  Complications: No notable events documented.

## 2022-01-20 NOTE — Interval H&P Note (Signed)
History and Physical Interval Note:  01/20/2022 3:58 PM  Jocelyn [name]  has presented today for surgery, with the diagnosis of Left ischemia limb.  The various methods of treatment have been discussed with the patient and family.  Initially, the patient was hesitant to have amputation but after discussions with her family she has decided to proceed with left above-knee amputation.  We will try to perform a venous thrombectomy during the procedure as well to try to debulk her significant DVT in that leg to promote wound healing.  After consideration of risks, benefits and other options for treatment, the patient has consented to  Procedure(s): AMPUTATION ABOVE KNEE (Left) as well as venous thrombectomy as a surgical intervention.  The patient's history has been reviewed, patient examined, no change in status, stable for surgery.  I have reviewed the patient's chart and labs.  Questions were answered to the patient's satisfaction.     [name]

## 2022-01-20 NOTE — Progress Notes (Signed)
PROGRESS NOTE    RAY GLACKEN  UXN:[id number] DOB: Sep 02, 1980  DOA: 01/17/2022 Date of Service: 01/20/22 PCP: [facility]     Brief Narrative / Hospital Course:  CALYNN FERRERO is a 41 y.o. female with medical history significant of DM, HTN, tobacco use disorder, iron deficiency anemia, peripheral arterial disease on Eliquis with history of left lower extremity stent placement in July 2021 complicated by in-stent thrombosis requiring thrombolytic therapy and more recently on 01/07/2022 undergoing mechanical thrombectomy of the left SFA with angioplasty who presents to the ED with a 2-day history of severe pain of the left leg associated with the appearance of blisters on the leg,.  Reports compliance with Eliquis. 07/09: Imaging in ED occlusion superficial femoral artery on the left, occlusive thrombus LLE from popliteal to common femoral vein.  Started on heparin infusion.  Taken to the OR by vascular surgery.  Admitted 07/10: repeat angio 07/11: Recommending amputation which patient was refusing, may benefit from venous intervention to reduce swelling which she was also refusing 07/12: Patient amenable to amputation and this is planned for today.  Consultants:  Vascular surgery Infectious disease  Procedures: 07/09 LLE Angiogram with angioplasty left SFA, posterior tibial artery, attempted penumbra thrombectomy left SFA, placement of lytic infusion catheter left posterior tibial artery 07/10: LLE angiogram, mechanical thrombectomy. Noted continued occlusion of the left SFA, popliteal artery, and all 3 tibial vessels with no runoff seen distally on initial imaging or after attempts at intervention    Subjective: Patient reports pain is controlled, she is struggling with the decision for amputation but has decided to go forward with it as recommended.     ASSESSMENT & PLAN:   Principal Problem:   Critical limb ischemia of left lower extremity (HCC) Active  Problems:   Acute deep vein thrombosis (DVT) of left femoral vein (HCC)   IDA (iron deficiency anemia)   Diabetes mellitus without complication (HCC)   HTN (hypertension)   Hypomagnesemia   Fever   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HCC)   Acute urinary retention   Critical limb ischemia of left lower extremity (HCC) PAD with recurrent left leg arterial occlusion from the SFA origin down  --started on Alteplase, d/c'ed on 7/10. --per Dr. Wyn Quaker, Patient has irreversible ischemia and not unreconstructable arterial insufficiency with no outflow and essentially no flow below the the lower thigh.  Her leg is clearly not salvageable at this point and she will require an above-knee amputation.  without amputation, this is a fatal and life ending problem likely within the next week or 2.  Patient had been refusing amputation but is now amenable. Plan: --continuous morphine with IV morphine PRN and Norco PRN, can increase rate of continuous morphine if needing frequent PRN's. -- Vascular surgery following and planning for left AKA today 01/20/2022  IDA (iron deficiency anemia) --1u pRBC for Hgb 7.0 on 7/9 --transfuse to keep Hgb >8  Diabetes mellitus without complication (HCC) Sliding scale insulin coverage  HTN (hypertension) Cont home amlodipine   Acute deep vein thrombosis (DVT) of left femoral vein (HCC) s/p angioplasty and thrombectomy on 01/07/2022  Questionable compliance with anticoagulant at home. --tense edema in LLE --started on heparin gtt --Dr. Wyn Quaker rec venous thrombectomy to help reduce the swelling and improving healing, however, pt refused the procedure today Plan: --cont heparin gtt   Fever --recurrent and up to 103, likely due to wet gangrene, however, pt also has a central line and multiple angio procedures in the past 48 hrs, so  will cover for MRSA. --ID consulted Plan: --start zosyn and daptomycin --f/u blood cx   Hypomagnesemia --monitor and replete PRN  Wet gangrene  (HCC) --secondary to acute ischemia due to arterial thrombosis left leg.  Likely the cause of pt's fever and leukocytosis. --ID consulted Plan: --blood cx --start zosyn and daptomycin today  Acute urinary retention --Foley placed on 7/9 for post-void >700 ml. --cont foley for now and perform voiding trial in about a week.    DVT prophylaxis: anticoag drip per vascular surgery  Code Status: FULL Family Communication: Family at bedside today on rounds Disposition Plan / TOC needs: We will need PT/OT eval when medically stable, likely will need SNF for rehab Barriers to discharge / significant pending items: Pending surgical intervention today, will likely be in the hospital a few more days             Objective: Vitals:   01/20/22 1200 01/20/22 1300 01/20/22 1400 01/20/22 1509  BP: 130/79 132/85 (!) 146/79 108/82  Pulse: (!) 114 (!) 117 (!) 117   Resp: 16 18 15 15   Temp: 98.2 F (36.8 C)     TempSrc: Oral     SpO2: 93% 98% 92%   Weight:      Height:       No intake or output data in the 24 hours ending 01/20/22 1605 Filed Weights   01/17/22 0112 01/17/22 0700  Weight: 81.6 kg 96.3 kg    Examination:  Constitutional:  VS as above General Appearance: alert, well-developed, well-nourished, NAD Eyes: Normal lids and conjunctive, non-icteric sclera Ears, Nose, Mouth, Throat: Normal appearance Neck: No masses, trachea midline Respiratory: Normal respiratory effort Breath sounds normal, no wheeze/rhonchi/rales Cardiovascular: S1/S2 normal, no murmur/rub/gallop auscultated Musculoskeletal:  Significant left lower extremity edema with blistering of the skin, extremity is cool Neurological: No cranial nerve deficit on limited exam Psychiatric: Normal judgment/insight Normal mood and affect       Scheduled Medications:   sodium chloride   Intravenous Once   amLODipine  5 mg Oral Daily   Chlorhexidine Gluconate Cloth  6 each Topical Q0600    Chlorhexidine Gluconate Cloth  6 each Topical Once   And   Chlorhexidine Gluconate Cloth  6 each Topical Once   insulin aspart  0-15 Units Subcutaneous TID WC   insulin aspart  0-5 Units Subcutaneous QHS    Continuous Infusions:  sodium chloride Stopped (01/17/22 1835)   sodium chloride Stopped (01/19/22 1350)    ceFAZolin (ANCEF) IV     DAPTOmycin (CUBICIN) 450 mg in sodium chloride 0.9 % IVPB 450 mg (01/19/22 2050)   heparin 1,800 Units/hr (01/20/22 0757)   morphine 2.6 mg/hr (01/19/22 1517)   piperacillin-tazobactam (ZOSYN)  IV 3.375 g (01/20/22 1335)   potassium chloride 10 mEq (01/20/22 1553)    PRN Medications:  sodium chloride, acetaminophen **OR** acetaminophen, diphenhydrAMINE **OR** diphenhydrAMINE, HYDROcodone-acetaminophen, morphine injection, morphine injection, naloxone **AND** sodium chloride flush, ondansetron **OR** ondansetron (ZOFRAN) IV, ondansetron (ZOFRAN) IV, mouth rinse  Antimicrobials:  Anti-infectives (From admission, onward)    Start     Dose/Rate Route Frequency Ordered Stop   01/20/22 0600  ceFAZolin (ANCEF) IVPB 2g/100 mL premix        2 g 200 mL/hr over 30 Minutes Intravenous On call to O.R. 01/20/22 0347 01/21/22 0559   01/19/22 2000  DAPTOmycin (CUBICIN) 450 mg in sodium chloride 0.9 % IVPB        6 mg/kg  72.7 kg (Adjusted) 118 mL/hr over 30 Minutes Intravenous  Daily 01/19/22 1849     01/19/22 1400  piperacillin-tazobactam (ZOSYN) IVPB 3.375 g        3.375 g 12.5 mL/hr over 240 Minutes Intravenous Every 8 hours 01/19/22 1036     01/18/22 1600  ceFAZolin (ANCEF) IVPB 2g/100 mL premix        2 g 200 mL/hr over 30 Minutes Intravenous  Once 01/18/22 1553 01/18/22 1753   01/17/22 0455  ceFAZolin (ANCEF) IVPB 1 g/50 mL premix        over 30 Minutes  Continuous PRN 01/17/22 0455 01/17/22 0607       Data Reviewed: I have personally reviewed following labs and imaging studies  CBC: Recent Labs  Lab 01/17/22 0116 01/17/22 0928 01/17/22 2030  01/18/22 0515 01/19/22 0132 01/20/22 0500  WBC 13.3* 13.6*  --  16.1* 20.3* 17.6*  NEUTROABS 10.2*  --   --   --   --   --   HGB 7.4* 7.0* 8.2* 8.1* 7.4* 7.1*  HCT 27.6* 25.5* 28.5* 28.5* 26.1* 25.4*  MCV 73.6* 72.9*  --  75.8* 75.7* 77.9*  PLT 800* 651*  --  429* 320 342   Basic Metabolic Panel: Recent Labs  Lab 01/17/22 0116 01/17/22 0928 01/18/22 0515 01/19/22 0132 01/20/22 0500  NA 135  --  137 137 140  K 3.8  --  3.6 3.6 3.3*  CL 103  --  107 106 111  CO2 22  --  23 24 25   GLUCOSE 118*  --  172* 103* 105*  BUN 8  --  8 7 8   CREATININE 0.71 0.66 0.63 0.59 0.66  CALCIUM 9.4  --  8.5* 8.2* 8.4*  MG  --   --  1.5* 1.5* 1.9   GFR: Estimated Creatinine Clearance: 105.1 mL/min (by C-G formula based on SCr of 0.66 mg/dL). Liver Function Tests: No results for input(s): "AST", "ALT", "ALKPHOS", "BILITOT", "PROT", "ALBUMIN" in the last 168 hours. No results for input(s): "LIPASE", "AMYLASE" in the last 168 hours. No results for input(s): "AMMONIA" in the last 168 hours. Coagulation Profile: Recent Labs  Lab 01/17/22 0116  INR 1.2   Cardiac Enzymes: Recent Labs  Lab 01/19/22 1853  CKTOTAL 181   BNP (last 3 results) No results for input(s): "PROBNP" in the last 8760 hours. HbA1C: No results for input(s): "HGBA1C" in the last 72 hours. CBG: Recent Labs  Lab 01/19/22 0846 01/19/22 1211 01/20/22 0010 01/20/22 0736 01/20/22 1123  GLUCAP 98 117* 105* 106* 106*   Lipid Profile: No results for input(s): "CHOL", "HDL", "LDLCALC", "TRIG", "CHOLHDL", "LDLDIRECT" in the last 72 hours. Thyroid Function Tests: No results for input(s): "TSH", "T4TOTAL", "FREET4", "T3FREE", "THYROIDAB" in the last 72 hours. Anemia Panel: No results for input(s): "VITAMINB12", "FOLATE", "FERRITIN", "TIBC", "IRON", "RETICCTPCT" in the last 72 hours. Urine analysis:    Component Value Date/Time   COLORURINE AMBER (A) 01/05/2022 2354   APPEARANCEUR CLOUDY (A) 01/05/2022 2354    APPEARANCEUR Hazy 04/24/2013 1419   LABSPEC >1.046 (H) 01/05/2022 2354   LABSPEC 1.018 04/24/2013 1419   PHURINE 5.0 01/05/2022 2354   GLUCOSEU NEGATIVE 01/05/2022 2354   GLUCOSEU Negative 04/24/2013 1419   HGBUR LARGE (A) 01/05/2022 2354   BILIRUBINUR NEGATIVE 01/05/2022 2354   BILIRUBINUR Negative 04/24/2013 1419   KETONESUR 5 (A) 01/05/2022 2354   PROTEINUR 100 (A) 01/05/2022 2354   NITRITE NEGATIVE 01/05/2022 2354   LEUKOCYTESUR NEGATIVE 01/05/2022 2354   LEUKOCYTESUR Negative 04/24/2013 1419   Sepsis Labs: @LABRCNTIP (procalcitonin:4,lacticidven:4)  Recent Results (from  the past 240 hour(s))  Culture, blood (Routine X 2) w Reflex to ID Panel     Status: None (Preliminary result)   Collection Time: 01/19/22 11:28 AM   Specimen: BLOOD RIGHT FOREARM  Result Value Ref Range Status   Specimen Description BLOOD RIGHT FOREARM  Final   Special Requests   Final    BOTTLES DRAWN AEROBIC AND ANAEROBIC Blood Culture adequate volume   Culture   Final    NO GROWTH < 24 HOURS Performed at [facility], [address], [place], [place] [address]    Report Status PENDING  Incomplete  Culture, blood (Routine X 2) w Reflex to ID Panel     Status: None (Preliminary result)   Collection Time: 01/19/22 11:32 AM   Specimen: BLOOD RIGHT HAND  Result Value Ref Range Status   Specimen Description BLOOD RIGHT HAND  Final   Special Requests   Final    BOTTLES DRAWN AEROBIC AND ANAEROBIC Blood Culture adequate volume   Culture   Final    NO GROWTH < 24 HOURS Performed at [facility], [address] [place], [place] [address]    Report Status PENDING  Incomplete  Aerobic Culture w Gram Stain (superficial specimen)     Status: None (Preliminary result)   Collection Time: 01/19/22  6:53 PM   Specimen: Wound  Result Value Ref Range Status   Specimen Description   Final    WOUND Performed at [facility], [address], [place], [place] [address]    Special  Requests   Final    LL Performed at [facility], [address] [place], [place] [address]    Gram Stain NO WBC SEEN NO ORGANISMS SEEN   Final   Culture   Final    NO GROWTH < 12 HOURS Performed at [facility], [address] [place], [place] [address]    Report Status PENDING  Incomplete         Radiology Studies last 96 hours: PERIPHERAL VASCULAR CATHETERIZATION  Result Date: 01/18/2022 See surgical note for result.  PERIPHERAL VASCULAR CATHETERIZATION  Result Date: 01/17/2022 See surgical note for result.  US Venous Img Lower Unilateral Left  Result Date: 01/17/2022 CLINICAL DATA:  Left leg pain and swelling EXAM: LEFT LOWER EXTREMITY VENOUS DOPPLER ULTRASOUND TECHNIQUE: Gray-scale sonography with graded compression, as well as color Doppler and duplex ultrasound were performed to evaluate the lower extremity deep venous systems from the level of the common femoral vein and including the common femoral, femoral, profunda femoral, popliteal and calf veins including the posterior tibial, peroneal and gastrocnemius veins when visible. The superficial great saphenous vein was also interrogated. Spectral Doppler was utilized to evaluate flow at rest and with distal augmentation maneuvers in the common femoral, femoral and popliteal veins. COMPARISON:  None Available. FINDINGS: Contralateral Common Femoral Vein: Respiratory phasicity is normal and symmetric with the symptomatic side. No evidence of thrombus. Normal compressibility. Common Femoral Vein: Occlusive thrombus is noted with decreased compressibility and occlusive nature. Saphenofemoral Junction: Occlusive thrombus is noted with decreased compressibility Profunda Femoral Vein: Occlusive thrombus is noted with decreased compressibility. Femoral Vein: Occlusive thrombus is noted with decreased compressibility. Popliteal Vein: Occlusive thrombus is noted with decreased compressibility. Calf Veins: Not well  visualized Superficial Great Saphenous Vein: No evidence of thrombus. Normal compressibility. Venous Reflux:  None. Other Findings:  None. IMPRESSION: Significant occlusive thrombus within the left lower extremity from the popliteal vein to the common femoral vein.  These findings would support that seen on recent CT examination. Electronically Signed   By: Alcide Clever M.D.   On: 01/17/2022 02:44   CT Angio Aortobifemoral W and/or Wo Contrast  Result Date: 01/17/2022 CLINICAL DATA:  Lower leg pain and swelling EXAM: CT ANGIOGRAPHY OF ABDOMINAL AORTA WITH ILIOFEMORAL RUNOFF TECHNIQUE: Multidetector CT imaging of the abdomen, pelvis and lower extremities was performed using the standard protocol during bolus administration of intravenous contrast. Multiplanar CT image reconstructions and MIPs were obtained to evaluate the vascular anatomy. RADIATION DOSE REDUCTION: This exam was performed according to the departmental dose-optimization program which includes automated exposure control, adjustment of the mA and/or kV according to patient size and/or use of iterative reconstruction technique. CONTRAST:  OMNIPAQUE IOHEXOL 350 MG/ML SOLN COMPARISON:  01/05/2022 FINDINGS: VASCULAR Aorta: Normal caliber aorta without aneurysm, dissection, vasculitis or significant stenosis. Celiac: Mild variant anatomy of the celiac axis is noted with the left hepatic artery arising directly from the aorta. The remainder of the celiac axis is within normal limits. SMA: Patent without evidence of aneurysm, dissection, vasculitis or significant stenosis. Renals: Dual renal arteries are noted on the right with single renal artery on the left. No obstructive changes are seen. IMA: Patent without evidence of aneurysm, dissection, vasculitis or significant stenosis. RIGHT Lower Extremity Inflow: Right common and external iliac artery are within normal limits. Common femoral artery is widely patent. Runoff: Superficial femoral artery and  popliteal artery are widely patent. Normal popliteal trifurcation is noted with runoff primarily via a diminutive anterior tibial artery. Posterior tibial artery is not visualized beyond the mid calf. LEFT Lower Extremity Inflow: Mild stenosis is noted at the origin of the common iliac artery. The thrombus at extends into the lumen on the prior exam is smaller in size but persistent. This may represent a site for potential distal emboli. The external iliac artery is patent. Common femoral artery is patent as well. Runoff: Profundus femoral artery is patent. The superficial femoral artery is occluded at its origin. Additionally there is significant stenting in the distal superficial femoral artery extending into the popliteal artery. This stent is occluded throughout its course. Muscular collaterals reconstitute the anterior tibial artery in the proximal calf. The posterior tibial artery is not visualized. Veins: Mild decreased attenuation is noted in the left iliac venous system. The vein on the left is somewhat larger than that seen on the right. This raises suspicion for deep venous thrombosis given the significant peripheral edema and swelling. Review of the MIP images confirms the above findings. NON-VASCULAR Lower chest: Lung bases are free of acute infiltrate or sizable effusion. Hepatobiliary: No focal liver abnormality is seen. No gallstones, gallbladder wall thickening, or biliary dilatation. Pancreas: Unremarkable. No pancreatic ductal dilatation or surrounding inflammatory changes. Spleen: Normal in size without focal abnormality. Adrenals/Urinary Tract: Adrenal glands are within normal limits. Kidneys demonstrate a normal enhancement pattern bilaterally. Some scarring is noted in the lower pole of the right kidney. Stomach/Bowel: No obstructive or inflammatory changes of the colon are seen. Mild diverticular changes noted without diverticulitis. Small bowel and stomach appear within normal limits.  Lymphatic: No lymphadenopathy is noted. Reproductive: Uterus and bilateral adnexa are unremarkable. Other: No abdominal wall hernia or abnormality. No abdominopelvic ascites. Musculoskeletal: There is significant edema involving the left lower extremity and extending into the left buttock some reactive lymphadenopathy is noted in the medial aspect of the left thigh. The left thigh is considerably larger than that the right thigh. These inflammatory changes extend  into the calf and to the level of the ankle increased from the prior exam. IMPRESSION: VASCULAR Left lower extremity: There remains thrombus extending into the lumen of the common iliac artery although the burden is less than that seen on the prior exam. There is now occlusion of the superficial femoral artery on the left at its origin which is progressive when compared with the prior exam. The profundus femoral artery is noted with multiple muscular collaterals reconstituting the anterior tibial artery in the calf. This continues to the level of the ankle. Right lower extremity: Patent superficial femoral and popliteal artery. The popliteal trifurcation is patent as well. The dominant runoff vessel is the diminutive anterior tibial artery. Posterior tibial artery is visualized to the mid calf. No other arterial abnormality is noted. Some mild decreased attenuation in the venous structures of the left lower extremity and left iliac veins is noted. Duplex examination is recommended to evaluate for venous thrombosis. NON-VASCULAR Considerable soft tissue swelling is noted involving the left leg from the level of the buttock to the foot. The leg is considerably increased in size. No muscular necrosis is noted. Mild diverticular change without diverticulitis. No other focal abnormality is noted. Electronically Signed   By: Alcide CleverMark  Lukens M.D.   On: 01/17/2022 02:23   DG Chest Portable 1 View  Result Date: 01/17/2022 CLINICAL DATA:  Leg pain swelling short of  breath EXAM: PORTABLE CHEST 1 VIEW COMPARISON:  01/05/2022 FINDINGS: The heart size and mediastinal contours are within normal limits. Both lungs are clear. The visualized skeletal structures are unremarkable. IMPRESSION: No active disease. Electronically Signed   By: Jasmine PangKim  Fujinaga M.D.   On: 01/17/2022 01:51            LOS: 3 days       Sunnie NielsenNatalie Alayziah Tangeman, DO [facility] 01/20/2022, 4:05 PM   Staff may message me via secure chat in Epic  but this may not receive immediate response,  please page for urgent matters!  If 7PM-7AM, please contact night-coverage [email or web address]  Dictation software was used to generate the above note. Typos may occur and escape review, as with typed/written notes. Please contact Dr Lyn HollingsheadAlexander directly for clarity if needed.

## 2022-01-20 NOTE — Op Note (Signed)
[facility]   OPERATIVE NOTE   PROCEDURE:  Left above-the-knee amputation Left femoral Fogarty thrombectomy  PRE-OPERATIVE DIAGNOSIS: Left foot gangrene, extensive left lower extremity DVT  POST-OPERATIVE DIAGNOSIS: same as above  SURGEON:  [name], MD  ASSISTANT(S): None  ANESTHESIA: general  ESTIMATED BLOOD LOSS: 400 cc  FINDING(S): Large amount of thrombus removed with thrombectomy in the left femoral vein after passing a 5 Fogarty embolectomy balloon  SPECIMEN(S):  Left above-the-knee amputation  INDICATIONS:   Jocelyn [name] is a 41 y.o. female who presents with left foot and lower leg gangrene.  She also has massive left leg swelling from and a severe whole leg left sided DVT which was a complicating feature besides her severe arterial insufficiency which was not unreconstructable.  The patient is scheduled for a left above-the-knee amputation.  I am also planning a venous thrombectomy to try to improve her venous outflow and reduce her swelling for wound healing.  I discussed in depth with the patient the risks, benefits, and alternatives to this procedure.  The patient is aware that the risk of this operation included but are not limited to:  bleeding, infection, myocardial infarction, stroke, death, failure to heal amputation wound, and possible need for more proximal amputation.  The patient is aware of the risks and agrees proceed forward with the procedure.  DESCRIPTION: After full informed written consent was obtained from the patient, the patient was taken to the operating room, and placed supine upon the operating table.  Prior to induction, the patient received IV antibiotics.  The patient was then prepped and draped in the standard fashion for a left above-the-knee amputation.  After obtaining adequate anesthesia, the patient was prepped and draped in the standard fashion for a above-the-knee amputation.  I marked out the anterior and  posterior flaps for a fish-mouth type of amputation.  I made the incisions for these flaps, and then dissected through the subcutaneous tissue, fascia, and muscles circumferentially.  I elevated  the periosteal tissue 4-5 cm more proximal than the anterior skin flap.  I then transected the femur with a power saw at this level.  Then I smoothed out the rough edges of the bone.  At this point, the specimen was passed off the field as the above-the-knee amputation.  At this point, I clamped all visibly bleeding arteries and veins using a combination of suture ligation with silk suture and electrocautery.  The femoral vein was identified as was the femoral artery.  The femoral artery was oversewn with silk suture ligature.  The femoral vein had fresh clot present.  I then brought a 5 Fogarty embolectomy balloon on the field.  I made a total of 4 passes proximally up to about 50 to 60 cm to remove thrombus from the left femoral vein and likely the iliac vein.  A large amount of fresh thrombus was removed with the 4 passes of the Fogarty embolectomy balloon in the venous system.  The vein was then oversewn with 2-0 silk suture ligature.  Bleeding continued to be controlled with electrocautery and suture ligature.  The stump was washed off with sterile normal saline and no further active bleeding was noted.  I reapproximated the anterior and posterior fascia  with interrupted stitches of 0 Vicryl.  This was completed along the entire length of anterior and posterior fascia until there were no more loose space in the fascial line. The subcutaneous tissue was then approximated with 2-0 vicryl sutures. The  skin was then  reapproximated with staples.  The stump was washed off and dried.  The incision was dressed with Xeroform and ABD pads, and  then fluffs were applied.  Kerlix was wrapped around the leg and then gently an ACE wrap was applied.  A large Ioban was then placed over the ACE wrap to secure the dressing. The  patient was then awakened and take to the recovery room in stable condition.   COMPLICATIONS: none  CONDITION: stable  [name]  01/20/2022, 6:40 PM   This note was created with Dragon Medical transcription system. Any errors in dictation are purely unintentional.

## 2022-01-20 NOTE — Progress Notes (Signed)
ANTICOAGULATION CONSULT NOTE  Pharmacy Consult for Heparin Indication: DVT and limb ischemia  No Known Allergies  Patient Measurements: Height: 5\' 5"  (165.1 cm) Weight: 96.3 kg (212 lb 4.9 oz) IBW/kg (Calculated) : 57 Heparin Dosing Weight: 74.4 kg   Vital Signs: Pulse Rate: 109 (07/12 0500)  Labs: Recent Labs    01/18/22 0235 01/18/22 0515 01/18/22 0801 01/19/22 0132 01/19/22 1853 01/20/22 0500  HGB  --  8.1*  --  7.4*  --  7.1*  HCT  --  28.5*  --  26.1*  --  25.4*  PLT  --  429*  --  320  --  342  APTT 51*  --  104* 88*  --   --   HEPARINUNFRC  --   --  0.50 0.37  --  0.26*  CREATININE  --  0.63  --  0.59  --  0.66  CKTOTAL  --   --   --   --  181  --      Estimated Creatinine Clearance: 105.1 mL/min (by C-G formula based on SCr of 0.66 mg/dL).   Medical History: Past Medical History:  Diagnosis Date   IDA (iron deficiency anemia) 12/17/2019    PTA: Eliquis 5mg  BID (Last dose  on 7/8 @ 1800) Inpatient: Heparin drip  Allergies: NKDA  Assessment: 41yo female with hx of PAD, multiple DVT's, stent placement presents to ED via EMS with complaints of LLE pain and swelling. Pt underwent thrombectomy and revascularization of LLE on 6/29 and was started on Eliquis 5 mg PO BID on 6/30. Pharmacy consulted to dose heparin for DVT/limb ischemia.   She is also receiving alteplase 0.02 mg/mL at 0.5 mg/hr via infusion catheter to improve inflow   Goal of Therapy:  Heparin level 0.3-0.7 units/ml aPTT 66 - 102 seconds Monitor platelets by anticoagulation protocol: Yes  Date Time aPTT/HL Rate/Comment 0709 0116 30 / >1.10 0709 0928 41 / ---  Subtherapeutic 0709 1815 42 / 0.81 Subtherapeutic 0710    0235    51                   Subtherapeutic  0710 0801 104/ 0.50 0711 0132 88/0.37 Therapeutic 7/13 0500 HL 0.26 Subtherapeutic  Plan:  Bolus 1100 units x 1 Continue heparin infusion at 1800 units/hr.  Recheck HL in 6 hr after rate change Continue to monitor H&H and  platelets daily while on heparin gtt.  06-11-1993, PharmD, [facility] 01/20/2022 6:58 AM

## 2022-01-20 NOTE — Progress Notes (Signed)
ANTICOAGULATION CONSULT NOTE  Pharmacy Consult for Heparin Indication: DVT and limb ischemia  No Known Allergies  Patient Measurements: Height: 5\' 5"  (165.1 cm) Weight: 96.3 kg (212 lb 4.9 oz) IBW/kg (Calculated) : 57 Heparin Dosing Weight: 74.4 kg   Vital Signs: Temp: 99.2 F (37.3 C) (07/12 0800) Temp Source: Oral (07/12 0800) BP: 143/98 (07/12 0901) Pulse Rate: 123 (07/12 0900)  Labs: Recent Labs    01/18/22 0235 01/18/22 0515 01/18/22 0801 01/19/22 0132 01/19/22 1853 01/20/22 0500  HGB  --  8.1*  --  7.4*  --  7.1*  HCT  --  28.5*  --  26.1*  --  25.4*  PLT  --  429*  --  320  --  342  APTT 51*  --  104* 88*  --   --   HEPARINUNFRC  --   --  0.50 0.37  --  0.26*  CREATININE  --  0.63  --  0.59  --  0.66  CKTOTAL  --   --   --   --  181  --      Estimated Creatinine Clearance: 105.1 mL/min (by C-G formula based on SCr of 0.66 mg/dL).   Medical History: Past Medical History:  Diagnosis Date   IDA (iron deficiency anemia) 12/17/2019    PTA: Eliquis 5mg  BID (Last dose  on 7/8 @ 1800) Inpatient: Heparin drip  Allergies: NKDA  Assessment: 41yo female with hx of PAD, multiple DVT's, stent placement presents to ED via EMS with complaints of LLE pain and swelling. Pt underwent thrombectomy and revascularization of LLE on 6/29 and was started on Eliquis 5 mg PO BID on 6/30. Pharmacy consulted to dose heparin for DVT/limb ischemia.    Goal of Therapy:  Heparin level 0.3-0.7 units/ml aPTT 66 - 102 seconds Monitor platelets by anticoagulation protocol: Yes   Plan:  Heparin level therapeutic following recent rate change: Continue heparin infusion at 1800 units/hr.  Heparin will be stopped for surgery later today: vascular surgery will decide if heparin is to be continued after surgery Continue to monitor H&H and platelets daily while on heparin gtt.  7/29, PharmD, BCPS 01/20/2022 11:05 AM

## 2022-01-20 NOTE — Anesthesia Preprocedure Evaluation (Addendum)
Anesthesia Evaluation  Patient identified by MRN, date of birth, ID band Patient awake    Reviewed: Allergy & Precautions, NPO status , Patient's Chart, lab work & pertinent test results  History of Anesthesia Complications Negative for: history of anesthetic complications  Airway Mallampati: III  TM Distance: >3 FB Neck ROM: full    Dental  (+) Chipped   Pulmonary neg shortness of breath, Current Smoker and Patient abstained from smoking.,    Pulmonary exam normal        Cardiovascular Exercise Tolerance: Good hypertension, (-) angina+ Peripheral Vascular Disease (on Eliquis)  Normal cardiovascular exam  ECG 01/18/22: Sinus tachycardia (HR 149)   Neuro/Psych negative neurological ROS  negative psych ROS   GI/Hepatic negative GI ROS, Neg liver ROS, neg GERD  ,  Endo/Other  diabetes, Type 2  Renal/GU      Musculoskeletal   Abdominal   Peds  Hematology  (+) Blood dyscrasia, anemia ,   Anesthesia Other Findings Past Medical History: 12/17/2019: IDA (iron deficiency anemia)  Past Surgical History: No date: APPENDECTOMY 01/11/2020: LOWER EXTREMITY ANGIOGRAPHY; Left     Comment:  Procedure: Lower Extremity Angiography;  Surgeon: Annice Needy, MD;  Location: [facility];  Service:               Cardiovascular;  Laterality: Left; 03/06/2020: LOWER EXTREMITY ANGIOGRAPHY; Left     Comment:  Procedure: Lower Extremity Angiography;  Surgeon: Annice Needy, MD;  Location: [facility];  Service:               Cardiovascular;  Laterality: Left; 03/07/2020: LOWER EXTREMITY ANGIOGRAPHY; Left     Comment:  Procedure: Lower Extremity Angiography;  Surgeon: Annice Needy, MD;  Location: [facility];  Service:               Cardiovascular;  Laterality: Left; 01/06/2022: LOWER EXTREMITY ANGIOGRAPHY; Left     Comment:  Procedure: Lower Extremity Angiography;  Surgeon:  Annice Needy, MD;  Location: [facility];  Service:               Cardiovascular;  Laterality: Left; 01/07/2022: LOWER EXTREMITY ANGIOGRAPHY; Left     Comment:  Procedure: Lower Extremity Angiography;  Surgeon: Annice Needy, MD;  Location: [facility];  Service:               Cardiovascular;  Laterality: Left; 01/18/2022: LOWER EXTREMITY ANGIOGRAPHY; Left     Comment:  Procedure: Lower Extremity Angiography;  Surgeon: Annice Needy, MD;  Location: [facility];  Service:               Cardiovascular;  Laterality: Left; 01/17/2022: LOWER EXTREMITY INTERVENTION; Bilateral     Comment:  Procedure: LOWER EXTREMITY INTERVENTION;  Surgeon:               Learta Codding, MD;  Location: [facility];  Service: Cardiovascular;  Laterality: Bilateral;  BMI    Body Mass Index: 35.33 kg/m      Reproductive/Obstetrics negative OB ROS                            Anesthesia Physical Anesthesia Plan  ASA: 3  Anesthesia Plan: General ETT   Post-op Pain Management:    Induction: Intravenous  PONV Risk Score and Plan: Ondansetron, Dexamethasone, Midazolam and Treatment may vary due to age or medical condition  Airway Management Planned: Oral ETT  Additional Equipment:   Intra-op Plan:   Post-operative Plan: Extubation in OR  Informed Consent: I have reviewed the patients History and Physical, chart, labs and discussed the procedure including the risks, benefits and alternatives for the proposed anesthesia with the patient or authorized representative who has indicated his/her understanding and acceptance.     Dental Advisory Given  Plan Discussed with: Anesthesiologist, CRNA and Surgeon  Anesthesia Plan Comments: (Patient consented for risks of anesthesia including but not limited to:  - adverse reactions to medications - damage to eyes, teeth, lips or other oral mucosa -  nerve damage due to positioning  - sore throat or hoarseness - Damage to heart, brain, nerves, lungs, other parts of body or loss of life  Patient voiced understanding.)        Anesthesia Quick Evaluation

## 2022-01-20 NOTE — Anesthesia Postprocedure Evaluation (Signed)
Anesthesia Post Note  Patient: Jocelyn [name]  Procedure(s) Performed: AMPUTATION ABOVE KNEE (Left: Knee) PERIPHERAL VASCULAR THROMBECTOMY Left Femoral vein (Left: Leg Upper)  Patient location during evaluation: SICU Anesthesia Type: General Level of consciousness: awake and alert Pain management: pain level controlled Vital Signs Assessment: post-procedure vital signs reviewed and stable Respiratory status: spontaneous breathing, nonlabored ventilation, respiratory function stable and patient connected to nasal cannula oxygen Cardiovascular status: blood pressure returned to baseline and stable Postop Assessment: no apparent nausea or vomiting Anesthetic complications: no   No notable events documented.   Last Vitals:  Vitals:   01/20/22 1900 01/20/22 2000  BP: 116/87 (!) 130/92  Pulse: (!) 113 (!) 114  Resp: 15 12  Temp:  37 C  SpO2: 100% 95%    Last Pain:  Vitals:   01/20/22 2054  TempSrc:   PainSc: 9                  Cleda Mccreedy Maryella Abood

## 2022-01-20 NOTE — Anesthesia Procedure Notes (Signed)
Procedure Name: Intubation Date/Time: 01/20/2022 5:04 PM  Performed by: Elmarie Mainland, CRNAPre-anesthesia Checklist: Patient identified, Emergency Drugs available, Suction available and Patient being monitored Patient Re-evaluated:Patient Re-evaluated prior to induction Oxygen Delivery Method: Circle system utilized Preoxygenation: Pre-oxygenation with 100% oxygen Induction Type: IV induction Ventilation: Mask ventilation without difficulty Laryngoscope Size: McGraph and 3 Grade View: Grade I Tube type: Oral Tube size: 7.0 mm Number of attempts: 1 Airway Equipment and Method: Stylet and Video-laryngoscopy Placement Confirmation: ETT inserted through vocal cords under direct vision, positive ETCO2 and breath sounds checked- equal and bilateral Secured at: 22 cm Tube secured with: Tape Dental Injury: Teeth and Oropharynx as per pre-operative assessment

## 2022-01-21 ENCOUNTER — Encounter: Payer: Self-pay | Admitting: Vascular Surgery

## 2022-01-21 DIAGNOSIS — Z48812 Encounter for surgical aftercare following surgery on the circulatory system: Secondary | ICD-10-CM

## 2022-01-21 LAB — BPAM RBC
Blood Product Expiration Date: 202307262359
Blood Product Expiration Date: 202308082359
ISSUE DATE / TIME: 202307091529
ISSUE DATE / TIME: 202307121727
Unit Type and Rh: 7300
Unit Type and Rh: 7300

## 2022-01-21 LAB — BASIC METABOLIC PANEL
Anion gap: 5 (ref 5–15)
BUN: 9 mg/dL (ref 6–20)
CO2: 23 mmol/L (ref 22–32)
Calcium: 8.1 mg/dL — ABNORMAL LOW (ref 8.9–10.3)
Chloride: 111 mmol/L (ref 98–111)
Creatinine, Ser: 0.61 mg/dL (ref 0.44–1.00)
GFR, Estimated: >60 mL/min (ref >60)
Glucose, Bld: 138 mg/dL — ABNORMAL HIGH (ref 70–99)
Potassium: 4.1 mmol/L (ref 3.5–5.1)
Sodium: 139 mmol/L (ref 135–145)

## 2022-01-21 LAB — CBC
HCT: 22.6 % — ABNORMAL LOW (ref 36.0–46.0)
Hemoglobin: 6.5 g/dL — ABNORMAL LOW (ref 12.0–15.0)
MCH: 22 pg — ABNORMAL LOW (ref 26.0–34.0)
MCHC: 28.8 g/dL — ABNORMAL LOW (ref 30.0–36.0)
MCV: 76.6 fL — ABNORMAL LOW (ref 80.0–100.0)
Platelets: 336 10*3/uL (ref 150–400)
RBC: 2.95 MIL/uL — ABNORMAL LOW (ref 3.87–5.11)
RDW: 24.1 % — ABNORMAL HIGH (ref 11.5–15.5)
WBC: 12.9 10*3/uL — ABNORMAL HIGH (ref 4.0–10.5)
nRBC: 0 % (ref 0.0–0.2)

## 2022-01-21 LAB — TYPE AND SCREEN
ABO/RH(D): B POS
Antibody Screen: NEGATIVE
Unit division: 0
Unit division: 0

## 2022-01-21 LAB — MAGNESIUM: Magnesium: 1.6 mg/dL — ABNORMAL LOW (ref 1.7–2.4)

## 2022-01-21 LAB — GLUCOSE, CAPILLARY
Glucose-Capillary: 140 mg/dL — ABNORMAL HIGH (ref 70–99)
Glucose-Capillary: 152 mg/dL — ABNORMAL HIGH (ref 70–99)
Glucose-Capillary: 141 mg/dL — ABNORMAL HIGH (ref 70–99)
Glucose-Capillary: 162 mg/dL — ABNORMAL HIGH (ref 70–99)

## 2022-01-21 LAB — PREPARE RBC (CROSSMATCH)

## 2022-01-21 LAB — HEMOGLOBIN AND HEMATOCRIT, BLOOD
HCT: 22.6 % — ABNORMAL LOW (ref 36.0–46.0)
HCT: 28.9 % — ABNORMAL LOW (ref 36.0–46.0)
Hemoglobin: 6.3 g/dL — ABNORMAL LOW (ref 12.0–15.0)
Hemoglobin: 8.8 g/dL — ABNORMAL LOW (ref 12.0–15.0)

## 2022-01-21 MED ORDER — NICOTINE 21 MG/24HR TD PT24
21.0000 mg | MEDICATED_PATCH | Freq: Every day | TRANSDERMAL | Status: DC
Start: 2022-01-21 — End: 2022-01-25
  Filled 2022-01-21: qty 1

## 2022-01-21 MED ORDER — SODIUM CHLORIDE 0.9% IV SOLUTION: Freq: Once | INTRAVENOUS | Status: AC

## 2022-01-21 NOTE — Progress Notes (Signed)
PROGRESS NOTE    Jocelyn [name]  MCN:[id number] DOB: 04-Sep-1980  DOA: 01/17/2022 Date of Service: 01/21/22 PCP: [facility]     Brief Narrative / Hospital Course:  Jocelyn [name] is a 41 y.o. female with medical history significant of DM, HTN, tobacco use disorder, iron deficiency anemia, peripheral arterial disease on Eliquis with history of left lower extremity stent placement in July 2021 complicated by in-stent thrombosis requiring thrombolytic therapy and more recently on 01/07/2022 undergoing mechanical thrombectomy of the left SFA with angioplasty who presents to the ED with a 2-day history of severe pain of the left leg associated with the appearance of blisters on the leg,.  Reports compliance with Eliquis. 07/09: Imaging in ED occlusion superficial femoral artery on the left, occlusive thrombus LLE from popliteal to common femoral vein.  Started on heparin infusion.  Taken to the OR by vascular surgery.  Admitted 07/10: repeat angio 07/11: Recommending amputation which patient was refusing, may benefit from venous intervention to reduce swelling which she was also refusing 07/12: Patient amenable to amputation and this is planned for today. 07/13: low Hgb, held AM eliquis and transfusing 2 units PRBC and if stable H/H will resume elisuis. Plan PT tomorrow.   Consultants:  Vascular surgery Infectious disease  Procedures: 07/09 LLE Angiogram with angioplasty left SFA, posterior tibial artery, attempted penumbra thrombectomy left SFA, placement of lytic infusion catheter left posterior tibial artery 07/10: LLE angiogram, mechanical thrombectomy. Noted continued occlusion of the left SFA, popliteal artery, and all 3 tibial vessels with no runoff seen distally on initial imaging or after attempts at intervention 07/12: LLE AKA amputation     Subjective: Patient reports pain is controlled, no concerns this morning, amenable to blood transfusion.       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 --tense edema in LLE --started on heparin gtt --Dr. Wyn Quaker rec venous thrombectomy to help reduce the swelling and improving healing, however, pt refused the procedure today Plan: --cont heparin gtt   Fever Resolved  Hypomagnesemia --monitor and replete PRN  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HCC) --secondary to acute ischemia due to arterial thrombosis left leg.  Likely the cause of pt's fever and leukocytosis. --ID consulted  Acute urinary retention --Foley placed on 7/9 for post-void >700 ml. --cont foley for now and perform voiding trial in about a week.    DVT prophylaxis: Eliquis once anemia resolved (plan for PM dose per pharmacy pending H/H post-2-unit-PRBC) Code Status: FULL Family Communication: Patient declines call to family today Disposition  Plan / TOC needs: We will need PT/OT eval when medically stable, likely will need SNF for rehab Barriers to discharge / significant  pending items: recen tpost-op, needing transfusion blood today, needs PT eval, anticipate SNF discharge after the weekend              Objective: Vitals:   01/21/22 1200 01/21/22 1215 01/21/22 1230 01/21/22 1245  BP: 100/67 (!) 101/58 (!) 93/57 104/74  Pulse: (!) 101 95 94 (!) 104  Resp: (!) 24 14 14 20   Temp:    98.1 F (36.7 C)  TempSrc:    Axillary  SpO2: 97% 95% 97% 93%  Weight:      Height:        Intake/Output Summary (Last 24 hours) at 01/21/2022 1358 Last data filed at 01/21/2022 1300 Gross per 24 hour  Intake 2637.66 ml  Output 1575 ml  Net 1062.66 ml   Filed Weights   01/17/22 0112 01/17/22 0700  Weight: 81.6 kg 96.3 kg    Examination:  Constitutional:  VS as above General Appearance: alert, well-developed, well-nourished, NAD Eyes: Normal lids and conjunctive, non-icteric sclera Ears, Nose, Mouth, Throat: Normal appearance Neck: No masses, trachea midline Respiratory: Normal respiratory effort Breath sounds normal, no wheeze/rhonchi/rales Cardiovascular: S1/S2 normal, no murmur/rub/gallop auscultated Musculoskeletal:  LLE surgically absent  Neurological: No cranial nerve deficit on limited exam Psychiatric: Normal judgment/insight Normal mood and affect       Scheduled Medications:   sodium chloride   Intravenous Once   sodium chloride   Intravenous Once   amLODipine  5 mg Oral Daily   apixaban  5 mg Oral BID   Chlorhexidine Gluconate Cloth  6 each Topical Q0600   insulin aspart  0-15 Units Subcutaneous TID WC   insulin aspart  0-5 Units Subcutaneous QHS   nicotine  21 mg Transdermal Daily    Continuous Infusions:  sodium chloride Stopped (01/17/22 1835)   sodium chloride 75 mL/hr at 01/21/22 1200   sodium chloride     DAPTOmycin (CUBICIN) 450 mg in sodium chloride 0.9 % IVPB Stopped (01/20/22 2034)   morphine 2.6 mg/hr (01/21/22 1200)   piperacillin-tazobactam (ZOSYN)  IV Stopped (01/21/22 0858)    PRN Medications:   sodium chloride, acetaminophen **OR** acetaminophen, diphenhydrAMINE **OR** diphenhydrAMINE, HYDROcodone-acetaminophen, HYDROmorphone (DILAUDID) injection, morphine injection, morphine injection, naloxone **AND** sodium chloride flush, ondansetron **OR** ondansetron (ZOFRAN) IV, mouth rinse  Antimicrobials:  Anti-infectives (From admission, onward)    Start     Dose/Rate Route Frequency Ordered Stop   01/20/22 0600  ceFAZolin (ANCEF) IVPB 2g/100 mL premix  Status:  Discontinued        2 g 200 mL/hr over 30 Minutes Intravenous On call to O.R. 01/20/22 0347 01/20/22 1836   01/19/22 2000  DAPTOmycin (CUBICIN) 450 mg in sodium chloride 0.9 % IVPB        6 mg/kg  72.7 kg (Adjusted) 118 mL/hr over 30 Minutes Intravenous Daily 01/19/22 1849     01/19/22 1400  piperacillin-tazobactam (ZOSYN) IVPB 3.375 g        3.375 g 12.5 mL/hr over 240 Minutes Intravenous Every 8 hours 01/19/22 1036     01/18/22 1600  ceFAZolin (ANCEF) IVPB 2g/100 mL premix        2 g 200 mL/hr over 30 Minutes Intravenous  Once 01/18/22 1553 01/18/22 1753   01/17/22 0455  ceFAZolin (ANCEF) IVPB 1 g/50 mL premix        over 30 Minutes  Continuous PRN 01/17/22 0455 01/17/22 03/20/22  Data Reviewed: I have personally reviewed following labs and imaging studies  CBC: Recent Labs  Lab 01/17/22 0116 01/17/22 0928 01/17/22 2030 01/18/22 0515 01/19/22 0132 01/20/22 0500 01/21/22 0443 01/21/22 0740  WBC 13.3* 13.6*  --  16.1* 20.3* 17.6* 12.9*  --   NEUTROABS 10.2*  --   --   --   --   --   --   --   HGB 7.4* 7.0*   < > 8.1* 7.4* 7.1* 6.5* 6.3*  HCT 27.6* 25.5*   < > 28.5* 26.1* 25.4* 22.6* 22.6*  MCV 73.6* 72.9*  --  75.8* 75.7* 77.9* 76.6*  --   PLT 800* 651*  --  429* 320 342 336  --    < > = values in this interval not displayed.   Basic Metabolic Panel: Recent Labs  Lab 01/17/22 0116 01/17/22 0928 01/18/22 0515 01/19/22 0132 01/20/22 0500 01/21/22 0443  NA 135  --  137 137 140 139  K 3.8  --  3.6 3.6  3.3* 4.1  CL 103  --  107 106 111 111  CO2 22  --  23 24 25 23   GLUCOSE 118*  --  172* 103* 105* 138*  BUN 8  --  8 7 8 9   CREATININE 0.71 0.66 0.63 0.59 0.66 0.61  CALCIUM 9.4  --  8.5* 8.2* 8.4* 8.1*  MG  --   --  1.5* 1.5* 1.9 1.6*   GFR: Estimated Creatinine Clearance: 105.1 mL/min (by C-G formula based on SCr of 0.61 mg/dL). Liver Function Tests: No results for input(s): "AST", "ALT", "ALKPHOS", "BILITOT", "PROT", "ALBUMIN" in the last 168 hours. No results for input(s): "LIPASE", "AMYLASE" in the last 168 hours. No results for input(s): "AMMONIA" in the last 168 hours. Coagulation Profile: Recent Labs  Lab 01/17/22 0116  INR 1.2   Cardiac Enzymes: Recent Labs  Lab 01/19/22 1853  CKTOTAL 181   BNP (last 3 results) No results for input(s): "PROBNP" in the last 8760 hours. HbA1C: No results for input(s): "HGBA1C" in the last 72 hours. CBG: Recent Labs  Lab 01/20/22 1630 01/20/22 1937 01/20/22 2153 01/21/22 0806 01/21/22 1247  GLUCAP 98 136* 223* 140* 152*   Lipid Profile: No results for input(s): "CHOL", "HDL", "LDLCALC", "TRIG", "CHOLHDL", "LDLDIRECT" in the last 72 hours. Thyroid Function Tests: No results for input(s): "TSH", "T4TOTAL", "FREET4", "T3FREE", "THYROIDAB" in the last 72 hours. Anemia Panel: No results for input(s): "VITAMINB12", "FOLATE", "FERRITIN", "TIBC", "IRON", "RETICCTPCT" in the last 72 hours. Urine analysis:    Component Value Date/Time   COLORURINE AMBER (A) 01/05/2022 2354   APPEARANCEUR CLOUDY (A) 01/05/2022 2354   APPEARANCEUR Hazy 04/24/2013 1419   LABSPEC >1.046 (H) 01/05/2022 2354   LABSPEC 1.018 04/24/2013 1419   PHURINE 5.0 01/05/2022 2354   GLUCOSEU NEGATIVE 01/05/2022 2354   GLUCOSEU Negative 04/24/2013 1419   HGBUR LARGE (A) 01/05/2022 2354   BILIRUBINUR NEGATIVE 01/05/2022 2354   BILIRUBINUR Negative 04/24/2013 1419   KETONESUR 5 (A) 01/05/2022 2354   PROTEINUR 100 (A) 01/05/2022 2354   NITRITE NEGATIVE 01/05/2022  2354   LEUKOCYTESUR NEGATIVE 01/05/2022 2354   LEUKOCYTESUR Negative 04/24/2013 1419   Sepsis Labs: @LABRCNTIP (procalcitonin:4,lacticidven:4)  Recent Results (from the past 240 hour(s))  Culture, blood (Routine X 2) w Reflex to ID Panel     Status: None (Preliminary result)   Collection Time: 01/19/22 11:28 AM   Specimen: BLOOD RIGHT FOREARM  Result Value Ref Range Status   Specimen Description BLOOD RIGHT FOREARM  Final   Special Requests   Final    BOTTLES DRAWN AEROBIC AND ANAEROBIC Blood Culture adequate volume   Culture   Final    NO GROWTH 2 DAYS Performed at [facility], [address] [place], [place] [address]    Report Status PENDING  Incomplete  Culture, blood (Routine X 2) w Reflex to ID Panel     Status: None (Preliminary result)   Collection Time: 01/19/22 11:32 AM   Specimen: BLOOD RIGHT HAND  Result Value Ref Range Status   Specimen Description BLOOD RIGHT HAND  Final   Special Requests   Final    BOTTLES DRAWN AEROBIC AND ANAEROBIC Blood Culture adequate volume   Culture   Final    NO GROWTH 2 DAYS Performed at [facility], [address], [place], [place] [address]    Report Status PENDING  Incomplete  Aerobic Culture w Gram Stain (superficial specimen)     Status: None (Preliminary result)   Collection Time: 01/19/22  6:53 PM   Specimen: Wound  Result Value Ref Range Status   Specimen Description   Final    WOUND Performed at [facility], [address], [place], [place] [address]    Special Requests   Final    LL Performed at [facility], [address], [place], [place] [address]    Gram Stain NO WBC SEEN NO ORGANISMS SEEN   Final   Culture   Final    NO GROWTH 2 DAYS Performed at [facility], [address], [place], [place] [address]    Report Status PENDING  Incomplete         Radiology Studies last 96 hours: PERIPHERAL VASCULAR CATHETERIZATION  Result Date: 01/18/2022 See surgical  note for result.           LOS: 4 days       Sunnie Nielsen, DO [facility] 01/21/2022, 1:58 PM   Staff may message me via secure chat in Epic  but this may not receive immediate response,  please page for urgent matters!  If 7PM-7AM, please contact night-coverage [email or web address]  Dictation software was used to generate the above note. Typos may occur and escape review, as with typed/written notes. Please contact Dr Lyn Hollingshead directly for clarity if needed.

## 2022-01-21 NOTE — Progress Notes (Signed)
Decreased UO, RN bladder scanned pt, , RN flushed foley, foley not draining. RN notified MD Lyn Hollingshead, per MD pt's choice if she wanted to try voiding herself or replace the foley, pt wants to try the purewick for six hours, if unable to void RN to replace foley.

## 2022-01-21 NOTE — Progress Notes (Signed)
[facility]  Daily Progress Note   Subjective  -   Doing well. Pain controlled  Objective Vitals:   01/21/22 0730 01/21/22 0745 01/21/22 0800 01/21/22 0847  BP:   112/72 112/72  Pulse: (!) 102 (!) 102 100   Resp: 19 14 13    Temp: 98.8 F (37.1 C)     TempSrc: Oral     SpO2: 100% 100% 95%   Weight:      Height:        Intake/Output Summary (Last 24 hours) at 01/21/2022 1113 Last data filed at 01/21/2022 0900 Gross per 24 hour  Intake 2247.81 ml  Output 1550 ml  Net 697.81 ml    PULM  CTAB CV  RRR VASC  Dressing C/D/I  Laboratory CBC    Component Value Date/Time   WBC 12.9 (H) 01/21/2022 0443   HGB 6.3 (L) 01/21/2022 0740   HGB 11.7 (L) 04/24/2013 1557   HCT 22.6 (L) 01/21/2022 0740   HCT 35.6 04/24/2013 1557   PLT 336 01/21/2022 0443   PLT 316 04/24/2013 1557    BMET    Component Value Date/Time   NA 139 01/21/2022 0443   NA 134 (L) 04/24/2013 1557   K 4.1 01/21/2022 0443   K 4.4 04/24/2013 1557   CL 111 01/21/2022 0443   CL 105 04/24/2013 1557   CO2 23 01/21/2022 0443   CO2 24 04/24/2013 1557   GLUCOSE 138 (H) 01/21/2022 0443   GLUCOSE 103 (H) 04/24/2013 1557   BUN 9 01/21/2022 0443   BUN 7 04/24/2013 1557   CREATININE 0.61 01/21/2022 0443   CREATININE 0.51 (L) 04/24/2013 1557   CALCIUM 8.1 (L) 01/21/2022 0443   CALCIUM 9.1 04/24/2013 1557   GFRNONAA >60 01/21/2022 0443   GFRNONAA >60 04/24/2013 1557   GFRAA >60 03/09/2020 0438   GFRAA >60 04/24/2013 1557    Assessment/Planning: POD #1 s/p left AKA and venous thrombectomy  Doing well Pain controlled VSS PT/OT Leave dressing on until POD 3-4 Resume Eliquis   Jocelyn [name]  01/21/2022, 11:13 AM

## 2022-01-21 NOTE — Progress Notes (Signed)
PT Cancellation Note  Patient Details Name: Jocelyn [name] MRN: [id number] DOB: [birth year]   Cancelled Treatment:    Reason Eval/Treat Not Completed: Other (comment). Patient is POD #1 s/p left AKA and venous thrombectomy, getting blood transfusion today. She will need new PT orders when appropriate, likely by tomorrow per secure chat with attending. Also would recommend to order OT as well given new AKA. PT will follow up for re-evaluation when ordered.   Donna Bernard, PT, MPT  Jocelyn [name] 01/21/2022, 11:46 AM

## 2022-01-22 DIAGNOSIS — I70202 Unspecified atherosclerosis of native arteries of extremities, left leg: Secondary | ICD-10-CM

## 2022-01-22 DIAGNOSIS — I998 Other disorder of circulatory system: Secondary | ICD-10-CM

## 2022-01-22 DIAGNOSIS — Z89612 Acquired absence of left leg above knee: Secondary | ICD-10-CM

## 2022-01-22 LAB — TYPE AND SCREEN
ABO/RH(D): B POS
Antibody Screen: NEGATIVE
Unit division: 0
Unit division: 0

## 2022-01-22 LAB — SURGICAL PATHOLOGY

## 2022-01-22 LAB — BASIC METABOLIC PANEL
Anion gap: 9 (ref 5–15)
BUN: 7 mg/dL (ref 6–20)
CO2: 23 mmol/L (ref 22–32)
Calcium: 8.5 mg/dL — ABNORMAL LOW (ref 8.9–10.3)
Chloride: 111 mmol/L (ref 98–111)
Creatinine, Ser: 0.69 mg/dL (ref 0.44–1.00)
GFR, Estimated: >60 mL/min (ref >60)
Glucose, Bld: 114 mg/dL — ABNORMAL HIGH (ref 70–99)
Potassium: 3.6 mmol/L (ref 3.5–5.1)
Sodium: 143 mmol/L (ref 135–145)

## 2022-01-22 LAB — AEROBIC CULTURE W GRAM STAIN (SUPERFICIAL SPECIMEN)
Culture: NO GROWTH
Gram Stain: NONE SEEN

## 2022-01-22 LAB — GLUCOSE, CAPILLARY
Glucose-Capillary: 117 mg/dL — ABNORMAL HIGH (ref 70–99)
Glucose-Capillary: 167 mg/dL — ABNORMAL HIGH (ref 70–99)
Glucose-Capillary: 116 mg/dL — ABNORMAL HIGH (ref 70–99)
Glucose-Capillary: 149 mg/dL — ABNORMAL HIGH (ref 70–99)

## 2022-01-22 LAB — CBC
HCT: 30.5 % — ABNORMAL LOW (ref 36.0–46.0)
Hemoglobin: 9.3 g/dL — ABNORMAL LOW (ref 12.0–15.0)
MCH: 23.5 pg — ABNORMAL LOW (ref 26.0–34.0)
MCHC: 30.5 g/dL (ref 30.0–36.0)
MCV: 77.2 fL — ABNORMAL LOW (ref 80.0–100.0)
Platelets: 376 10*3/uL (ref 150–400)
RBC: 3.95 MIL/uL (ref 3.87–5.11)
RDW: 20.3 % — ABNORMAL HIGH (ref 11.5–15.5)
WBC: 12 10*3/uL — ABNORMAL HIGH (ref 4.0–10.5)
nRBC: 0 % (ref 0.0–0.2)

## 2022-01-22 LAB — BPAM RBC
Blood Product Expiration Date: 202307252359
Blood Product Expiration Date: 202308082359
ISSUE DATE / TIME: 202307131119
ISSUE DATE / TIME: 202307131534
Unit Type and Rh: 1700
Unit Type and Rh: 7300

## 2022-01-22 LAB — MAGNESIUM: Magnesium: 1.7 mg/dL (ref 1.7–2.4)

## 2022-01-22 MED ORDER — MORPHINE SULFATE (PF) 2 MG/ML IV SOLN
2.0000 mg | INTRAVENOUS | Status: DC | PRN
  Administered 2022-01-22 – 2022-01-24 (×8): 2 mg via INTRAVENOUS
  Filled 2022-01-22 (×9): qty 1

## 2022-01-22 MED ORDER — METHOCARBAMOL 500 MG PO TABS
1000.0000 mg | ORAL_TABLET | Freq: Three times a day (TID) | ORAL | Status: AC | PRN
  Administered 2022-01-22 – 2022-01-24 (×5): 1000 mg via ORAL
  Filled 2022-01-22 (×6): qty 2

## 2022-01-22 NOTE — Progress Notes (Addendum)
PROGRESS NOTE    [name]  IOE:[id number] DOB: 12/18/1980  DOA: 01/17/2022 Date of Service: 01/22/22 PCP: [facility]     Brief Narrative / Hospital Course:  Jocelyn [name] is a 41 y.o. female with medical history significant of DM, HTN, tobacco use disorder, iron deficiency anemia, peripheral arterial disease on Eliquis with history of left lower extremity stent placement in July 2021 complicated by in-stent thrombosis requiring thrombolytic therapy and more recently on 01/07/2022 undergoing mechanical thrombectomy of the left SFA with angioplasty who presents to the ED with a 2-day history of severe pain of the left leg associated with the appearance of blisters on the leg,.  Reports compliance with Eliquis. 07/09: Imaging in ED occlusion superficial femoral artery on the left, occlusive thrombus LLE from popliteal to common femoral vein.  Started on heparin infusion.  Taken to the OR by vascular surgery.  Admitted 07/10: repeat angio 07/11: Recommending amputation which patient was refusing, may benefit from venous intervention to reduce swelling which she was also refusing 07/12: Patient amenable to amputation and this is planned for today. 07/13: low Hgb, held AM eliquis and transfusing 2 units PRBC and if stable H/H will resume elisuis. Plan PT tomorrow.  07/14: stable, doing well. To MedSurg and off tele.   Consultants:  Vascular surgery Infectious disease  Procedures: 07/09 LLE Angiogram with angioplasty left SFA, posterior tibial artery, attempted penumbra thrombectomy left SFA, placement of lytic infusion catheter left posterior tibial artery 07/10: LLE angiogram, mechanical thrombectomy. Noted continued occlusion of the left SFA, popliteal artery, and all 3 tibial vessels with no runoff seen distally on initial imaging or after attempts at intervention 07/12: LLE AKA amputation     Subjective: Patient reports pain is controlled overall but some  muscle spasms, just took RObaxin so not sure who well this is working yet. 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  Likely the cause of pt's fever and leukocytosis. ID following, deesclaate/discontinue abx per their recs   Acute urinary retention Foley placed on 7/9 for post-void >700 ml. Removed Monitor bladder scan prn for retention contienue I&O     DVT prophylaxis: Eliquis  Code Status: FULL Family Communication: Family at bedside today on rounds  Disposition Plan / TOC needs: We will need PT/OT eval when medically stable, likely will need SNF for rehab though patient states she  does not want to go to facility. TOC to follow.  Barriers to discharge / significant pending items: recent post-op, continuing to monitor, PT/OT to evaluaete, will need surgical ok to d/c              Objective: Vitals:    01/22/22 0410 01/22/22 0600 01/22/22 1000 01/22/22 1515  BP:  135/84 133/82 118/76  Pulse: 96 97 96   Resp: 15 12 16 17   Temp:   98.5 F (36.9 C) 98.4 F (36.9 C)  TempSrc:   Oral Oral  SpO2: 98% 98% 96%   Weight:      Height:        Intake/Output Summary (Last 24 hours) at 01/22/2022 1537 Last data filed at 01/22/2022 1000 Gross per 24 hour  Intake 1873.53 ml  Output 900 ml  Net 973.53 ml   Filed Weights   01/17/22 0112 01/17/22 0700  Weight: 81.6 kg 96.3 kg    Examination:  Constitutional:  VS as above General Appearance: alert, well-developed, well-nourished, NAD Eyes: Normal lids and conjunctive, non-icteric sclera Ears, Nose, Mouth, Throat: Normal appearance Neck: No masses, trachea midline Respiratory: Normal respiratory effort Breath sounds normal, no wheeze/rhonchi/rales Cardiovascular: S1/S2 normal, no murmur/rub/gallop auscultated Musculoskeletal:  LLE surgically absent, bandages were not disturbed RLE no edema  Neurological: No cranial nerve deficit on limited exam Psychiatric: Normal judgment/insight Normal mood and affect       Scheduled Medications:   sodium chloride   Intravenous Once   amLODipine  5 mg Oral Daily   apixaban  5 mg Oral BID   Chlorhexidine Gluconate Cloth  6 each Topical Q0600   insulin aspart  0-15 Units Subcutaneous TID WC   insulin aspart  0-5 Units Subcutaneous QHS   nicotine  21 mg Transdermal Daily    Continuous Infusions:  sodium chloride Stopped (01/17/22 1835)   sodium chloride     DAPTOmycin (CUBICIN) 450 mg in sodium chloride 0.9 % IVPB 450 mg (01/21/22 2039)   morphine 2.6 mg/hr (01/22/22 1000)   piperacillin-tazobactam (ZOSYN)  IV 3.375 g (01/22/22 1515)    PRN Medications:  sodium chloride, acetaminophen **OR** acetaminophen, diphenhydrAMINE **OR** diphenhydrAMINE, HYDROcodone-acetaminophen, HYDROmorphone (DILAUDID) injection, methocarbamol, morphine injection, morphine injection, naloxone **AND**  sodium chloride flush, ondansetron **OR** ondansetron (ZOFRAN) IV, mouth rinse  Antimicrobials:  Anti-infectives (From admission, onward)    Start     Dose/Rate Route Frequency Ordered Stop   01/20/22 0600  ceFAZolin (ANCEF) IVPB 2g/100 mL premix  Status:  Discontinued        2 g 200 mL/hr over 30 Minutes Intravenous On call to O.R. 01/20/22 0347 01/20/22 1836   01/19/22 2000  DAPTOmycin (CUBICIN) 450 mg in sodium chloride 0.9 % IVPB        6 mg/kg  72.7 kg (Adjusted) 118 mL/hr over 30 Minutes Intravenous Daily 01/19/22 1849     01/19/22 1400  piperacillin-tazobactam (ZOSYN) IVPB 3.375 g        3.375 g 12.5 mL/hr over 240 Minutes Intravenous Every 8 hours 01/19/22 1036     01/18/22 1600  ceFAZolin (ANCEF) IVPB 2g/100 mL premix        2 g 200 mL/hr over 30 Minutes Intravenous  Once 01/18/22 1553 01/18/22 1753   01/17/22 0455  ceFAZolin (ANCEF) IVPB 1 g/50 mL premix        over 30 Minutes  Continuous PRN 01/17/22 0455 01/17/22 03/20/22       Data Reviewed: I have personally reviewed following labs and imaging studies  CBC: Recent Labs  Lab 01/17/22 0116 01/17/22 0928 01/18/22 0515 01/19/22 0132 01/20/22 0500 01/21/22 0443 01/21/22 0740 01/21/22 2145 01/22/22 0515  WBC 13.3*   < > 16.1* 20.3* 17.6* 12.9*  --   --  12.0*  NEUTROABS 10.2*  --   --   --   --   --   --   --   --   HGB 7.4*   < > 8.1* 7.4* 7.1* 6.5* 6.3* 8.8* 9.3*  HCT 27.6*   < > 28.5* 26.1* 25.4* 22.6* 22.6* 28.9* 30.5*  MCV 73.6*   < > 75.8* 75.7* 77.9* 76.6*  --   --  77.2*  PLT 800*   < > 429* 320 342 336  --   --  376   < > = values in this interval not displayed.   Basic Metabolic Panel: Recent Labs  Lab 01/18/22 0515 01/19/22 0132 01/20/22 0500 01/21/22 0443 01/22/22 0515  NA 137 137 140 139 143  K 3.6 3.6 3.3* 4.1 3.6  CL 107 106 111 111 111  CO2 23 24 25 23 23   GLUCOSE 172* 103* 105* 138* 114*  BUN 8 7 8 9 7   CREATININE 0.63 0.59 0.66 0.61 0.69  CALCIUM 8.5* 8.2* 8.4* 8.1* 8.5*  MG 1.5*  1.5* 1.9 1.6* 1.7   GFR: Estimated Creatinine Clearance: 105.1 mL/min (by C-G formula based on SCr of 0.69 mg/dL). Liver Function Tests: No results for input(s): "AST", "ALT", "ALKPHOS", "BILITOT", "PROT", "ALBUMIN" in the last 168 hours. No results for input(s): "LIPASE", "AMYLASE" in the last 168 hours. No results for input(s): "AMMONIA" in the last 168 hours. Coagulation Profile: Recent Labs  Lab 01/17/22 0116  INR 1.2   Cardiac Enzymes: Recent Labs  Lab 01/19/22 1853  CKTOTAL 181   BNP (last 3 results) No results for input(s): "PROBNP" in the last 8760 hours. HbA1C: No results for input(s): "HGBA1C" in the last 72 hours. CBG: Recent Labs  Lab 01/21/22 1247 01/21/22 1730 01/21/22 2138 01/22/22 0752 01/22/22 1159  GLUCAP 152* 141* 162* 117* 167*   Lipid Profile: No results for input(s): "CHOL", "HDL", "LDLCALC", "TRIG", "CHOLHDL", "LDLDIRECT" in the last 72 hours. Thyroid Function Tests: No results for input(s): "TSH", "T4TOTAL", "FREET4", "T3FREE", "THYROIDAB" in the last 72 hours. Anemia Panel: No results for input(s): "VITAMINB12", "FOLATE", "FERRITIN", "TIBC", "IRON", "RETICCTPCT" in the last 72 hours. Urine analysis:    Component Value Date/Time   COLORURINE AMBER (A) 01/05/2022 2354   APPEARANCEUR CLOUDY (A) 01/05/2022 2354   APPEARANCEUR Hazy 04/24/2013 1419   LABSPEC >1.046 (H) 01/05/2022 2354   LABSPEC 1.018 04/24/2013 1419   PHURINE 5.0 01/05/2022 2354   GLUCOSEU NEGATIVE 01/05/2022 2354   GLUCOSEU Negative 04/24/2013 1419   HGBUR LARGE (A) 01/05/2022 2354   BILIRUBINUR NEGATIVE 01/05/2022 2354   BILIRUBINUR Negative 04/24/2013 1419   KETONESUR 5 (A) 01/05/2022 2354   PROTEINUR 100 (A) 01/05/2022 2354   NITRITE NEGATIVE 01/05/2022 2354   LEUKOCYTESUR NEGATIVE 01/05/2022 2354   LEUKOCYTESUR Negative 04/24/2013 1419   Sepsis Labs: @LABRCNTIP (procalcitonin:4,lacticidven:4)  Recent Results (from the past 240 hour(s))  Culture, blood (Routine X  2) w Reflex to ID Panel     Status: None (Preliminary result)   Collection Time: 01/19/22 11:28 AM   Specimen: BLOOD RIGHT FOREARM  Result Value Ref Range Status   Specimen Description BLOOD RIGHT FOREARM  Final   Special Requests   Final    BOTTLES DRAWN AEROBIC AND ANAEROBIC Blood Culture adequate volume  Culture   Final    NO GROWTH 3 DAYS Performed at [facility], [address] [place], [place] [address]    Report Status PENDING  Incomplete  Culture, blood (Routine X 2) w Reflex to ID Panel     Status: None (Preliminary result)   Collection Time: 01/19/22 11:32 AM   Specimen: BLOOD RIGHT HAND  Result Value Ref Range Status   Specimen Description BLOOD RIGHT HAND  Final   Special Requests   Final    BOTTLES DRAWN AEROBIC AND ANAEROBIC Blood Culture adequate volume   Culture   Final    NO GROWTH 3 DAYS Performed at [facility], [address], [place], [place] [address]    Report Status PENDING  Incomplete  Aerobic Culture w Gram Stain (superficial specimen)     Status: None   Collection Time: 01/19/22  6:53 PM   Specimen: Wound  Result Value Ref Range Status   Specimen Description   Final    WOUND Performed at [facility], [address] [place], [place] [address]    Special Requests   Final    LL Performed at [facility], [address] [place], [place] [address]    Gram Stain NO WBC SEEN NO ORGANISMS SEEN   Final   Culture   Final    NO GROWTH 2 DAYS Performed at [facility], [address], [place], [place] [address]    Report Status 01/22/2022 FINAL  Final         Radiology Studies last 96 hours: PERIPHERAL VASCULAR CATHETERIZATION  Result Date: 01/18/2022 See surgical note for result.           LOS: 5 days       Sunnie Nielsen, DO [facility] 01/22/2022, 3:37 PM   Staff may message me via secure chat in Epic  but this may not receive immediate response,  please page for  urgent matters!  If 7PM-7AM, please contact night-coverage [email or web address]  Dictation software was used to generate the above note. Typos may occur and escape review, as with typed/written notes. Please contact Dr Lyn Hollingshead directly for clarity if needed.

## 2022-01-22 NOTE — Progress Notes (Signed)
[facility]  Daily Progress Note   Subjective  -   Doing well.  Pain control seems adequate.  Mobility is difficult thus far  Objective Vitals:   01/22/22 0410 01/22/22 0600 01/22/22 1000 01/22/22 1515  BP:  135/84 133/82 118/76  Pulse: 96 97 96   Resp: 15 12 16 17   Temp:   98.5 F (36.9 C) 98.4 F (36.9 C)  TempSrc:   Oral Oral  SpO2: 98% 98% 96%   Weight:      Height:        Intake/Output Summary (Last 24 hours) at 01/22/2022 1721 Last data filed at 01/22/2022 1000 Gross per 24 hour  Intake 1536.08 ml  Output 650 ml  Net 886.08 ml    PULM  CTAB CV  slightly tachycardic VASC  dressing is clean, dry, and intact  Laboratory CBC    Component Value Date/Time   WBC 12.0 (H) 01/22/2022 0515   HGB 9.3 (L) 01/22/2022 0515   HGB 11.7 (L) 04/24/2013 1557   HCT 30.5 (L) 01/22/2022 0515   HCT 35.6 04/24/2013 1557   PLT 376 01/22/2022 0515   PLT 316 04/24/2013 1557    BMET    Component Value Date/Time   NA 143 01/22/2022 0515   NA 134 (L) 04/24/2013 1557   K 3.6 01/22/2022 0515   K 4.4 04/24/2013 1557   CL 111 01/22/2022 0515   CL 105 04/24/2013 1557   CO2 23 01/22/2022 0515   CO2 24 04/24/2013 1557   GLUCOSE 114 (H) 01/22/2022 0515   GLUCOSE 103 (H) 04/24/2013 1557   BUN 7 01/22/2022 0515   BUN 7 04/24/2013 1557   CREATININE 0.69 01/22/2022 0515   CREATININE 0.51 (L) 04/24/2013 1557   CALCIUM 8.5 (L) 01/22/2022 0515   CALCIUM 9.1 04/24/2013 1557   GFRNONAA >60 01/22/2022 0515   GFRNONAA >60 04/24/2013 1557   GFRAA >60 03/09/2020 0438   GFRAA >60 04/24/2013 1557    Assessment/Planning: POD #2 s/p left AKA, venous thrombectomy  Going fairly well Hemoglobin is responded appropriately to blood transfusion Okay to move to the floor PT/OT Plan to be here until the first of the week and would be beneficial to see if PT thinks she would benefit from rehab versus home with home physical therapy Eliquis for anticoagulation Remove her  dressing probably on Sunday   Saturday  01/22/2022, 5:21 PM

## 2022-01-22 NOTE — Progress Notes (Addendum)
ID Pt underwent left AKA  for gangrene and deep vein thrombectomy on 01/20/22 Doing well No specific issues    O/e awake and alert No distress Patient Vitals for the past 24 hrs:  BP Temp Temp src Pulse Resp SpO2  01/22/22 1515 118/76 98.4 F (36.9 C) Oral -- 17 --  01/22/22 1000 133/82 98.5 F (36.9 C) Oral 96 16 96 %  01/22/22 0600 135/84 -- -- 97 12 98 %  01/22/22 0410 -- -- -- 96 15 98 %  01/22/22 0400 118/78 98.8 F (37.1 C) Oral 99 16 92 %  01/22/22 0300 -- -- -- 95 18 96 %  01/22/22 0200 (!) 102/58 -- -- 94 13 92 %  01/22/22 0100 -- -- -- (!) 102 16 95 %  01/22/22 0000 122/69 97.8 F (36.6 C) Oral -- 15 98 %  01/21/22 2300 -- -- -- -- 17 --  01/21/22 2212 -- -- -- 97 17 98 %  01/21/22 2200 131/80 -- -- 94 (!) 22 98 %  01/21/22 2100 120/69 -- -- (!) 103 17 100 %  01/21/22 2021 -- -- -- (!) 109 19 100 %  01/21/22 2000 -- -- -- (!) 101 (!) 21 100 %  01/21/22 1900 (!) 144/82 98.8 F (37.1 C) Oral (!) 102 (!) 21 98 %  01/21/22 1800 130/84 98.3 F (36.8 C) Oral 96 15 98 %   Chest b/l air entry [id number] tachycardia Abd soft Rt femoral line Left AKA dressing g not removed  Labs    Latest Ref Rng & Units 01/22/2022    5:15 AM 01/21/2022    9:45 PM 01/21/2022    7:40 AM  CBC  WBC 4.0 - 10.5 K/uL 12.0     Hemoglobin 12.0 - 15.0 g/dL 9.3  8.8  6.3   Hematocrit 36.0 - 46.0 % 30.5  28.9  22.6   Platelets 150 - 400 K/uL 376          Latest Ref Rng & Units 01/22/2022    5:15 AM 01/21/2022    4:43 AM 01/20/2022    5:00 AM  CMP  Glucose 70 - 99 mg/dL 277  824  235   BUN 6 - 20 mg/dL 7  9  8    Creatinine 0.44 - 1.00 mg/dL  3.61  4.43   Sodium 135 - 145 mmol/L 143  139  140   Potassium 3.5 - 5.1 mmol/L 3.6  4.1  3.3   Chloride 98 - 111 mmol/L 111  111  111   CO2 22 - 32 mmol/L 23  23  25    Calcium 8.9 - 10.3 mg/dL 8.5  8.1  8.4     Micro BC from 7/12 NG WC from 7/12 NG  Impression/recommendation  Fever and leucocytosis resolved- likely due to gangrene extremity  and DVT- s/o AKA and thrombectomy Pt is currently on day 4 of dapto and zosyn. Blood culture neg, blster fluid culture neg Will DC both antibiotic  Acute lower limb ischemia left s/p AKA  AKI resolved   Anemia Rt femoral line- need to be removed - informed her nurse  Discussed the management with patient and her nurse ID will sign off now call if needed

## 2022-01-22 NOTE — Progress Notes (Signed)
Report was called to care nurse receiving patient into room 218. PIV was placed by IV team per patient request. Right femoral central line was removed with no complication. Pressure held for 10 minutes and hemostasis achieved; site was clean without redness or swelling. 4x4 gauze applied and covered with transparent dressing. Patient was transported via bed to room 218, accompanied by visitor and nurse tech. Pt alert and oriented, is s/p IV push morphine for pain.

## 2022-01-22 NOTE — Consult Note (Signed)
Pharmacy Antibiotic Note  Jocelyn [name] is a 41 y.o. female with PMH including DM, HTN, tobacco use disorder, IDA, PAD on Eliquis, history of LLE stenting in 01/2020 c/b in-stent thrombosis requiring thrombolytic therapy and more recently mechanical thrombectomy of left SFA with angioplasty 01/07/2022 admitted on 01/17/2022 with  critical limb ischemia of LLE and acute DVT of left femoral vein . Since admission, patient has undergone endovascular procedure on 7/9 including left lower extremity angiogram with angioplasty and thrombectomy followed by catheter directed thrombolysis and then returned to OR 7/10 for repeat left lower extremity angiogram with mechanical thrombectomy and balloon angioplasty. ID is following for gangrenous leg and fevers. Pharmacy has been consulted for daptomycin dosing. Patient is also ordered Zosyn.  Plan:  Daptomycin 450 mg IV q24h --6 mg/kg AdjBW given BMI 35 --4.5 mg/kg TBW Baseline CK 79 (01/05/2022) then 181 on 01/19/2022. Will check weekly CK while on daptomycin (first scheduled for 01/26/2022 Pt continues on Zosyn 3.375g IV q8h  Height: 5\' 5"  (165.1 cm) Weight: 96.3 kg (212 lb 4.9 oz) IBW/kg (Calculated) : 57  Temp (24hrs), Avg:98.3 F (36.8 C), Min:97.8 F (36.6 C), Max:98.8 F (37.1 C)  Recent Labs  Lab 01/18/22 0515 01/19/22 0132 01/20/22 0500 01/21/22 0443 01/22/22 0515  WBC 16.1* 20.3* 17.6* 12.9* 12.0*  CREATININE 0.63 0.59 0.66 0.61 0.69     Estimated Creatinine Clearance: 105.1 mL/min (by C-G formula based on SCr of 0.69 mg/dL).    No Known Allergies  Antimicrobials this admission: Cefazolin 7/9 >> 7/10 (surgical ppx) Zosyn 7/11 >>  Daptomycin 7/11 >>   Dose adjustments this admission: N/A  Microbiology results: 7/11 BCx: NGTD 7/11 Superficial left leg Wcx: NGTD  Thank you for allowing pharmacy to be a part of this patient's care.  9/11 01/22/2022 12:31 PM

## 2022-01-23 DIAGNOSIS — I739 Peripheral vascular disease, unspecified: Secondary | ICD-10-CM

## 2022-01-23 LAB — GLUCOSE, CAPILLARY
Glucose-Capillary: 91 mg/dL (ref 70–99)
Glucose-Capillary: 106 mg/dL — ABNORMAL HIGH (ref 70–99)
Glucose-Capillary: 106 mg/dL — ABNORMAL HIGH (ref 70–99)
Glucose-Capillary: 131 mg/dL — ABNORMAL HIGH (ref 70–99)

## 2022-01-23 LAB — BASIC METABOLIC PANEL
Anion gap: 6 (ref 5–15)
BUN: 8 mg/dL (ref 6–20)
CO2: 25 mmol/L (ref 22–32)
Calcium: 8.6 mg/dL — ABNORMAL LOW (ref 8.9–10.3)
Chloride: 110 mmol/L (ref 98–111)
Creatinine, Ser: 0.49 mg/dL (ref 0.44–1.00)
GFR, Estimated: >60 mL/min (ref >60)
Glucose, Bld: 95 mg/dL (ref 70–99)
Potassium: 3 mmol/L — ABNORMAL LOW (ref 3.5–5.1)
Sodium: 141 mmol/L (ref 135–145)

## 2022-01-23 LAB — CBC
HCT: 30.4 % — ABNORMAL LOW (ref 36.0–46.0)
Hemoglobin: 9.2 g/dL — ABNORMAL LOW (ref 12.0–15.0)
MCH: 23.8 pg — ABNORMAL LOW (ref 26.0–34.0)
MCHC: 30.3 g/dL (ref 30.0–36.0)
MCV: 78.8 fL — ABNORMAL LOW (ref 80.0–100.0)
Platelets: 389 10*3/uL (ref 150–400)
RBC: 3.86 MIL/uL — ABNORMAL LOW (ref 3.87–5.11)
RDW: 21.1 % — ABNORMAL HIGH (ref 11.5–15.5)
WBC: 8.3 10*3/uL (ref 4.0–10.5)
nRBC: 0 % (ref 0.0–0.2)

## 2022-01-23 MED ORDER — POTASSIUM CHLORIDE CRYS ER 20 MEQ PO TBCR
40.0000 meq | EXTENDED_RELEASE_TABLET | Freq: Two times a day (BID) | ORAL | Status: AC
  Administered 2022-01-23 (×2): 40 meq via ORAL
  Filled 2022-01-23 (×2): qty 2

## 2022-01-23 NOTE — Plan of Care (Signed)
  Problem: Health Behavior/Discharge Planning: Goal: Ability to manage health-related needs will improve Outcome: Progressing   Problem: Clinical Measurements: Goal: Ability to maintain clinical measurements within normal limits will improve Outcome: Progressing   

## 2022-01-23 NOTE — Progress Notes (Signed)
3 Days Post-Op   Subjective/Chief Complaint: Doing OK. Pain controlled with current regimen. Has not worked with PT yet. Otherwise without complaint   Objective: Vital signs in last 24 hours: Temp:  [98.4 F (36.9 C)-99.5 F (37.5 C)] 98.4 F (36.9 C) (07/15 0742) Pulse Rate:  [78-96] 78 (07/15 0742) Resp:  [16-18] 18 (07/15 0742) BP: (118-159)/(76-92) 144/91 (07/15 0742) SpO2:  [96 %-98 %] 98 % (07/15 0742) Last BM Date :  (PTA)  Intake/Output from previous day: 07/14 0701 - 07/15 0700 In: 1410.6 [P.O.:237; I.V.:1135.4; IV Piggyback:38.2] Out: -  Intake/Output this shift: No intake/output data recorded.  General appearance: alert and no distress Cardio: regular rate and rhythm Extremities: LEFT AKA stump- dressing C/D/I, thigh soft  Lab Results:  Recent Labs    01/22/22 0515 01/23/22 0608  WBC 12.0* 8.3  HGB 9.3* 9.2*  HCT 30.5* 30.4*  PLT 376 389   BMET Recent Labs    01/22/22 0515 01/23/22 0608  NA 143 141  K 3.6 3.0*  CL 111 110  CO2 23 25  GLUCOSE 114* 95  BUN 7 8  CREATININE 0.69 0.49  CALCIUM 8.5* 8.6*   PT/INR No results for input(s): "LABPROT", "INR" in the last 72 hours. ABG No results for input(s): "PHART", "HCO3" in the last 72 hours.  Invalid input(s): "PCO2", "PO2"  Studies/Results: No results found.  Anti-infectives: Anti-infectives (From admission, onward)    Start     Dose/Rate Route Frequency Ordered Stop   01/20/22 0600  ceFAZolin (ANCEF) IVPB 2g/100 mL premix  Status:  Discontinued        2 g 200 mL/hr over 30 Minutes Intravenous On call to O.R. 01/20/22 0347 01/20/22 1836   01/19/22 2000  DAPTOmycin (CUBICIN) 450 mg in sodium chloride 0.9 % IVPB  Status:  Discontinued        6 mg/kg  72.7 kg (Adjusted) 118 mL/hr over 30 Minutes Intravenous Daily 01/19/22 1849 01/22/22 1539   01/19/22 1400  piperacillin-tazobactam (ZOSYN) IVPB 3.375 g  Status:  Discontinued        3.375 g 12.5 mL/hr over 240 Minutes Intravenous Every 8  hours 01/19/22 1036 01/22/22 1539   01/18/22 1600  ceFAZolin (ANCEF) IVPB 2g/100 mL premix        2 g 200 mL/hr over 30 Minutes Intravenous  Once 01/18/22 1553 01/18/22 1753   01/17/22 0455  ceFAZolin (ANCEF) IVPB 1 g/50 mL premix        over 30 Minutes  Continuous PRN 01/17/22 0455 01/17/22 0607       Assessment/Plan: s/p Procedure(s): AMPUTATION ABOVE KNEE (Left) PERIPHERAL VASCULAR THROMBECTOMY Left Femoral vein (Left)   POD #3 s/p left AKA, venous thrombectomy OOB with PT Will change AKA dressing tomorrow. Pain control Dispo early next week- rehab v home  LOS: 6 days    Jocelyn [name] A 01/23/2022

## 2022-01-23 NOTE — Evaluation (Addendum)
Occupational Therapy Evaluation Patient Details Name: Jocelyn [name] MRN: [id number] DOB: 04/04/1981 Today's Date: 01/23/2022   History of Present Illness Pt is a 41 year old female admitted with severe pain of the left leg associated with the appearance of blisters on the leg. Pt had the following procedures: ? 07/09 LLE Angiogram with angioplasty left SFA, posterior tibial artery, attempted penumbra thrombectomy left SFA, placement of lytic infusion catheter left posterior tibial artery 07/10: LLE angiogram, mechanical thrombectomy. 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 Pt is alert and oriented x4, agreeable to OT intervention. 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 Pt requires CGA to perform supine>sit with HOB raised and heavy use of bed rails, MIN A for STS with RW and step by step vcs for technique, MIN A for SPT to bedside commode with step by step vcs. Pt completes peri care with MIN A using STS and lateral leans. Grooming tasks completed in sitting with SET UP.  Extensive education provided re: rehab continuum, importance of UB strengthening, DME use, falls prevention, management of residual limb. Pt reports her daughter can assist after discharge home. Pt is left on BSC in care of PT and RN, NAD, all needs met. Recommend discharge to intensive therapy to address functional deficits and to facilitate return to PLOF. Pt presents with the ability to tolerate 3+ hrs of therapy.      Recommendations for follow up therapy are one component of a multi-disciplinary discharge planning process, led by the attending physician.  Recommendations may be updated based on patient status, additional functional criteria and insurance authorization.   Follow  Up Recommendations  Acute inpatient rehab (3hours/day)    Assistance Recommended at Discharge Intermittent Supervision/Assistance  Patient can return home with the following A lot of help with walking and/or transfers;A lot of help with bathing/dressing/bathroom;Assistance with cooking/housework;Assist for transportation;Help with stairs or ramp for entrance    Functional Status Assessment  Patient has had a recent decline in their functional status and demonstrates the ability to make significant improvements in function in a reasonable and predictable amount of time.  Equipment Recommendations  Other (comment) (per next venue of care)    Recommendations for Other Services       Precautions / Restrictions Precautions Precautions: Fall Restrictions Weight Bearing Restrictions: Yes LLE Weight Bearing: Non weight bearing      Mobility Bed Mobility Overal bed mobility: Needs Assistance Bed Mobility: Supine to Sit     Supine to sit: Min guard, HOB elevated          Transfers Overall transfer level: Needs assistance Equipment used: Rolling walker (2 wheels) Transfers: Sit to/from Stand Sit to Stand: Min assist           General transfer comment: step by step vcs for approrpaite RW use      Balance Overall balance assessment: Needs assistance Sitting-balance support: Bilateral upper extremity supported (R foot supported) Sitting balance-Leahy Scale: Fair     Standing balance support: Bilateral upper extremity supported, During functional activity Standing balance-Leahy Scale: Poor                             ADL either performed or assessed  with clinical judgement   ADL Overall ADL's : Needs assistance/impaired Eating/Feeding: Set up;Sitting   Grooming: Wash/dry hands;Wash/dry face;Sitting;Set up           Upper Body Dressing : Minimal assistance;Sitting   Lower Body Dressing: Maximal assistance   Toilet Transfer: Minimal  assistance;Rolling walker (2 wheels);BSC/3in1 Toilet Transfer Details (indicate cue type and reason): SPT to bsc with step by step vcs Toileting- Clothing Manipulation and Hygiene: Minimal assistance;Sit to/from stand;Sitting/lateral lean               Vision Patient Visual Report: No change from baseline       Perception     Praxis      Pertinent Vitals/Pain Pain Assessment Pain Assessment: 0-10 Pain Score: 9  Pain Location: LLE Pain Descriptors / Indicators: Aching, Grimacing Pain Intervention(s): Limited activity within patient's tolerance, Monitored during session, Repositioned     Hand Dominance Right   Extremity/Trunk Assessment Upper Extremity Assessment Upper Extremity Assessment: Overall WFL for tasks assessed   Lower Extremity Assessment Lower Extremity Assessment: LLE deficits/detail RLE Deficits / Details: AROM is grossly WFL. endurance likely impaired for sustained activity LLE Deficits / Details: s/p AKA LLE Sensation:  (absent light touch on the bottom of the foot. decreased light touch distally> proximally)       Communication Communication Communication: No difficulties   Cognition Arousal/Alertness: Awake/alert Behavior During Therapy: WFL for tasks assessed/performed Overall Cognitive Status: Within Functional Limits for tasks assessed                                       General Comments       Exercises     Shoulder Instructions      Home Living Family/patient expects to be discharged to:: Private residence Living Arrangements: Children (53 year old daughter) Available Help at Discharge: Family;Available PRN/intermittently Type of Home: Apartment Home Access: Stairs to enter Entrance Stairs-Number of Steps: 11 w/ landing in halfway Entrance Stairs-Rails: Left Home Layout: One level     Bathroom Shower/Tub: Teacher, early years/pre: Standard     Home Equipment: Conservation officer, nature (2 wheels);BSC/3in1           Prior Functioning/Environment Prior Level of Function : Independent/Modified Independent             Mobility Comments: PTA amb with no AD, recently using RW ADLs Comments: 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        OT Problem List: Decreased strength;Decreased activity tolerance;Impaired balance (sitting and/or standing);Decreased safety awareness;Decreased knowledge of use of DME or AE      OT Treatment/Interventions: Self-care/ADL training;Therapeutic exercise;Patient/family education;Balance training;Energy conservation;Therapeutic activities;DME and/or AE instruction    OT Goals(Current goals can be found in the care plan section) Acute Rehab OT Goals Patient Stated Goal: get stronger OT Goal Formulation: With patient Time For Goal Achievement: 02/06/22 Potential to Achieve Goals: Good ADL Goals Pt Will Perform Grooming: with modified independence;sitting Pt Will Transfer to Toilet: with modified independence;stand pivot transfer Pt Will Perform Toileting - Clothing Manipulation and hygiene: with modified independence;sit to/from stand  OT Frequency: Min 4X/week    Co-evaluation              AM-PAC OT "6 Clicks" Daily Activity     Outcome Measure Help from another person eating meals?: None Help from another person taking care of personal grooming?: None Help from another  person toileting, which includes using toliet, bedpan, or urinal?: A Little Help from another person bathing (including washing, rinsing, drying)?: A Little Help from another person to put on and taking off regular upper body clothing?: A Little Help from another person to put on and taking off regular lower body clothing?: A Lot 6 Click Score: 19   End of Session Equipment Utilized During Treatment: Gait belt;Rolling walker (2 wheels) Nurse Communication: Mobility status  Activity Tolerance: Patient tolerated treatment well Patient left:  (on BSC with PT present for hand  off)  OT Visit Diagnosis: Unsteadiness on feet (R26.81);Other abnormalities of gait and mobility (R26.89)                Time: 1020-1051 OT Time Calculation (min): 31 min Charges:  OT General Charges $OT Visit: 1 Visit OT Evaluation $OT Eval Moderate Complexity: 1 Mod OT Treatments $Self Care/Home Management : 8-22 mins  Shanon Payor, OTD OTR/L  01/23/22, 2:45 PM

## 2022-01-23 NOTE — Progress Notes (Signed)
Inpatient Rehab Admissions Coordinator Note:   Per therapy patient was screened for CIR candidacy by Melchizedek Espinola Luvenia Starch, CCC-SLP. At this time, pt appears to be a potential candidate for CIR. I will place an order for rehab consult for full assessment, per our protocol.  Please contact me any with questions.Wolfgang Phoenix, MS, CCC-SLP Admissions Coordinator [phone] 01/23/22 3:52 PM

## 2022-01-23 NOTE — Progress Notes (Signed)
PROGRESS NOTE    Jocelyn [name]  VZC:[id number] DOB: [birth year] DOA: 01/17/2022 PCP: [facility]   Assessment & Plan:   Principal Problem:   Critical limb ischemia of left lower extremity (HCC) Active Problems:   Acute deep vein thrombosis (DVT) of left femoral vein (HCC)   IDA (iron deficiency anemia)   Diabetes mellitus without complication (HCC)   HTN (hypertension)   Hypomagnesemia   Fever   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HCC)   Acute urinary retention  Assessment and Plan: Critical limb ischemia of left lower extremity: w/ hx of PAD w/ recurrent left leg arterial occlusion from the SFA origin down. 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 PT/OT consulted   PAD with recurrent left leg arterial occlusion from the SFA origin down   IDA: w/ component of acute blood loss anemia secondary to recent surg. S/p 3 units of pRBCs transfused. Will continue to monitor H&H   DM2: likely poorly controlled. Continue on SSI w/ accuchecks  HTN: continue on home dose of amlodipine     Acute deep vein thrombosis: of left femoral vein. S/p LLE AKA as per vasc surg    Fever: resolved    Hypomagnesemia: WNL yesterday   Wet gangrene: secondary to acute ischemia due to arterial thrombosis of left leg. S/p L AKA as per vasc surg. Abxs were d/c as per ID     Acute urinary retention: s/p foley remove. Bladder scan prn   Hypokalemia: potassium given      DVT prophylaxis: eliquis  Code Status: full  Family Communication:  Disposition Plan:  depends on PT/OT recs  Level of care: Med-Surg   Status is: Inpatient Remains inpatient appropriate because: severity of illness    Consultants:  Vascular surg   Procedures:  Antimicrobials:    Subjective: Pt c/o fatigue   Objective: Vitals:   01/22/22 1515 01/22/22 1854 01/23/22 0343 01/23/22 0742  BP: 118/76 (!) 159/92 (!) 151/83 (!) 144/91  Pulse:  96 94 78  Resp: 17 18  18   Temp: 98.4 F (36.9 C) 98.4 F (36.9 C) 99.5  F (37.5 C) 98.4 F (36.9 C)  TempSrc: Oral Oral Oral Oral  SpO2:  98% 98% 98%  Weight:      Height:        Intake/Output Summary (Last 24 hours) at 01/23/2022 0747 Last data filed at 01/22/2022 2050 Gross per 24 hour  Intake 1410.57 ml  Output --  Net 1410.57 ml   Filed Weights   01/17/22 0112 01/17/22 0700  Weight: 81.6 kg 96.3 kg    Examination:  General exam: Appears calm and comfortable  Respiratory system: Clear to auscultation. Respiratory effort normal. Cardiovascular system: S1 & S2 +. No  rubs, gallops or clicks.  Gastrointestinal system: Abdomen is nondistended, soft and nontender. Normal bowel sounds heard. Central nervous system: Alert and oriented. Moves all extremities  Psychiatry: Judgement and insight appear normal. Flat mood and affect.     Data Reviewed: I have personally reviewed following labs and imaging studies  CBC: Recent Labs  Lab 01/17/22 0116 01/17/22 0928 01/19/22 0132 01/20/22 0500 01/21/22 0443 01/21/22 0740 01/21/22 2145 01/22/22 0515 01/23/22 0608  WBC 13.3*   < > 20.3* 17.6* 12.9*  --   --  12.0* 8.3  NEUTROABS 10.2*  --   --   --   --   --   --   --   --   HGB 7.4*   < > 7.4* 7.1* 6.5*  6.3* 8.8* 9.3* 9.2*  HCT 27.6*   < > 26.1* 25.4* 22.6* 22.6* 28.9* 30.5* 30.4*  MCV 73.6*   < > 75.7* 77.9* 76.6*  --   --  77.2* 78.8*  PLT 800*   < > 320 342 336  --   --  376 389   < > = values in this interval not displayed.   Basic Metabolic Panel: Recent Labs  Lab 01/18/22 0515 01/19/22 0132 01/20/22 0500 01/21/22 0443 01/22/22 0515 01/23/22 0608  NA 137 137 140 139 143 141  K 3.6 3.6 3.3* 4.1 3.6 3.0*  CL 107 106 111 111 111 110  CO2 23 24 25 23 23 25   GLUCOSE 172* 103* 105* 138* 114* 95  BUN 8 7 8 9 7 8   CREATININE 0.63 0.59 0.66 0.61 0.69 0.49  CALCIUM 8.5* 8.2* 8.4* 8.1* 8.5* 8.6*  MG 1.5* 1.5* 1.9 1.6* 1.7  --    GFR: Estimated Creatinine Clearance: 105.1 mL/min (by C-G formula based on SCr of 0.49 mg/dL). Liver  Function Tests: No results for input(s): "AST", "ALT", "ALKPHOS", "BILITOT", "PROT", "ALBUMIN" in the last 168 hours. No results for input(s): "LIPASE", "AMYLASE" in the last 168 hours. No results for input(s): "AMMONIA" in the last 168 hours. Coagulation Profile: Recent Labs  Lab 01/17/22 0116  INR 1.2   Cardiac Enzymes: Recent Labs  Lab 01/19/22 1853  CKTOTAL 181   BNP (last 3 results) No results for input(s): "PROBNP" in the last 8760 hours. HbA1C: No results for input(s): "HGBA1C" in the last 72 hours. CBG: Recent Labs  Lab 01/21/22 2138 01/22/22 0752 01/22/22 1159 01/22/22 1634 01/22/22 2133  GLUCAP 162* 117* 167* 116* 149*   Lipid Profile: No results for input(s): "CHOL", "HDL", "LDLCALC", "TRIG", "CHOLHDL", "LDLDIRECT" in the last 72 hours. Thyroid Function Tests: No results for input(s): "TSH", "T4TOTAL", "FREET4", "T3FREE", "THYROIDAB" in the last 72 hours. Anemia Panel: No results for input(s): "VITAMINB12", "FOLATE", "FERRITIN", "TIBC", "IRON", "RETICCTPCT" in the last 72 hours. Sepsis Labs: Recent Labs  Lab 01/17/22 0116 01/19/22 1853  PROCALCITON 0.11 80.60    Recent Results (from the past 240 hour(s))  Culture, blood (Routine X 2) w Reflex to ID Panel     Status: None (Preliminary result)   Collection Time: 01/19/22 11:28 AM   Specimen: BLOOD RIGHT FOREARM  Result Value Ref Range Status   Specimen Description BLOOD RIGHT FOREARM  Final   Special Requests   Final    BOTTLES DRAWN AEROBIC AND ANAEROBIC Blood Culture adequate volume   Culture   Final    NO GROWTH 4 DAYS Performed at [facility], [address], [place], [address] [place]    Report Status PENDING  Incomplete  Culture, blood (Routine X 2) w Reflex to ID Panel     Status: None (Preliminary result)   Collection Time: 01/19/22 11:32 AM   Specimen: BLOOD RIGHT HAND  Result Value Ref Range Status   Specimen Description BLOOD RIGHT HAND  Final   Special Requests   Final     BOTTLES DRAWN AEROBIC AND ANAEROBIC Blood Culture adequate volume   Culture   Final    NO GROWTH 4 DAYS Performed at [facility], [address] [place], [address] [place]    Report Status PENDING  Incomplete  Aerobic Culture w Gram Stain (superficial specimen)     Status: None   Collection Time: 01/19/22  6:53 PM   Specimen: Wound  Result Value Ref Range Status  Specimen Description   Final    WOUND Performed at [facility], [address] [place], [place] [address]    Special Requests   Final    LL Performed at [facility], [address] [place], [place] [address]    Gram Stain NO WBC SEEN NO ORGANISMS SEEN   Final   Culture   Final    NO GROWTH 2 DAYS Performed at [facility], [address] [place], [place] [address]    Report Status 01/22/2022 FINAL  Final         Radiology Studies: No results found.      Scheduled Meds:  sodium chloride   Intravenous Once   amLODipine  5 mg Oral Daily   apixaban  5 mg Oral BID   Chlorhexidine Gluconate Cloth  6 each Topical Q0600   insulin aspart  0-15 Units Subcutaneous TID WC   insulin aspart  0-5 Units Subcutaneous QHS   nicotine  21 mg Transdermal Daily   Continuous Infusions:  sodium chloride Stopped (01/17/22 1835)   sodium chloride       LOS: 6 days    Time spent: 30 mins     Charise Killian, MD [facility] Pager [phone]  If 7PM-7AM, please contact night-coverage [email or web address] 01/23/2022, 7:47 AM

## 2022-01-23 NOTE — Evaluation (Addendum)
Physical Therapy Evaluation Patient Details Name: Jocelyn [name] MRN: [id number] DOB: Mar 29, 1981 Today's Date: 01/23/2022  History of Present Illness  Patient is a 41 year old female with continued rest pain of the left foot and now with gangrenous changes to her left foot with history of multiple vascular procedures. Findings from vascular procedure from 7/10 reports continued occlusion of the left SFA, popliteal artery, and all 3 tibial vessels with no runoff seen distally on initial imaging or after attempts at intervention with further intervention recommended. Pt received an AKA of the L LE on 01/20/22.    Clinical Impression  Pt upright on BSC upon arrival as part of OT hand-off.  Pt performed self-care and was able to manage toileting without complications.  Pt then assisted with CGA transfer back to the recliner without any physical assist.  Pt does require some cuing for proper hand placement and LE placement in order to transfer to the recliner.  Pt has good strength in the UE's in order to perform skip to transfer to the recliner.  Pt left with all needs met and call bell within reach.  Pt given exercise packet s/p AKA in order for pt to perform as part of HEP in between therapy sessions.  Current discharge plans to CIR are appropriate at this time due to need for intensive therapy.  Daughter is willing to assist pt once d/c home from CIR.  Pt would benefit from intensive therapy in order to improve overall QoL and return to PLOF.  Pt will continue to benefit from skilled therapy in order to address deficits listed below.       Recommendations for follow up therapy are one component of a multi-disciplinary discharge planning process, led by the attending physician.  Recommendations may be updated based on patient status, additional functional criteria and insurance authorization.  Follow Up Recommendations Acute inpatient rehab (3hours/day) Can patient physically be transported by private  vehicle: No    Assistance Recommended at Discharge Frequent or constant Supervision/Assistance  Patient can return home with the following  A lot of help with walking and/or transfers;A lot of help with bathing/dressing/bathroom;Assist for transportation;Help with stairs or ramp for entrance    Equipment Recommendations Other (comment) (TBD at next venue of care.)  Recommendations for Other Services       Functional Status Assessment Patient has had a recent decline in their functional status and demonstrates the ability to make significant improvements in function in a reasonable and predictable amount of time.     Precautions / Restrictions Precautions Precautions: Fall Restrictions Weight Bearing Restrictions: Yes LLE Weight Bearing: Non weight bearing      Mobility  Bed Mobility               General bed mobility comments: pt upright on BSC as part of OT hand-off.  Pt returned to the recliner.    Transfers Overall transfer level: Needs assistance Equipment used: Rolling walker (2 wheels) Transfers: Sit to/from Stand Sit to Stand: Min guard           General transfer comment: verbal cuing needed for sequencing and for hand and foot placement in order to perform hop/pivot to the chair.    Ambulation/Gait                  Stairs            Wheelchair Mobility    Modified Rankin (Stroke Patients Only)       Balance Overall  balance assessment: Needs assistance Sitting-balance support: Bilateral upper extremity supported, Feet supported       Standing balance support: Bilateral upper extremity supported, During functional activity Standing balance-Leahy Scale: Fair                               Pertinent Vitals/Pain Pain Assessment Pain Assessment: 0-10 Pain Score: 9  Pain Location: LLE, noting phantom limb pain Pain Descriptors / Indicators: Aching, Constant, Grimacing, Tender Pain Intervention(s): Limited activity  within patient's tolerance, Monitored during session    Home Living Family/patient expects to be discharged to:: Private residence Living Arrangements: Children Available Help at Discharge: Family;Available PRN/intermittently Type of Home: Apartment Home Access: Stairs to enter Entrance Stairs-Rails: Left Entrance Stairs-Number of Steps: 11 w/ landing in halfway   Home Layout: One level Home Equipment: Conservation officer, nature (2 wheels);BSC/3in1      Prior Function Prior Level of Function : Independent/Modified Independent             Mobility Comments: limited walking, has been using rolling walker ADLs Comments: independent     Hand Dominance   Dominant Hand: Right    Extremity/Trunk Assessment        Lower Extremity Assessment RLE Deficits / Details: AROM is grossly WFL. endurance likely impaired for sustained activity LLE Deficits / Details: s/p AKA of the L LE LLE Sensation:  (absent light touch on the bottom of the foot. decreased light touch distally> proximally)       Communication   Communication: No difficulties  Cognition Arousal/Alertness: Awake/alert Behavior During Therapy: WFL for tasks assessed/performed Overall Cognitive Status: Within Functional Limits for tasks assessed                                 General Comments: pt much more present during treatment session today.        General Comments      Exercises Other Exercises Other Exercises: Pt given exercise packet as part of HEP in order to perform exercises in between bouts of therapy.   Assessment/Plan    PT Assessment Patient needs continued PT services  PT Problem List Decreased strength;Decreased range of motion;Decreased activity tolerance;Decreased balance;Decreased mobility;Decreased skin integrity;Pain;Impaired sensation;Cardiopulmonary status limiting activity       PT Treatment Interventions DME instruction;Gait training;Stair training;Functional mobility  training;Therapeutic activities;Therapeutic exercise;Balance training;Neuromuscular re-education;Cognitive remediation;Patient/family education;Wheelchair mobility training    PT Goals (Current goals can be found in the Care Plan section)  Acute Rehab PT Goals Patient Stated Goal: to get stronger PT Goal Formulation: With patient Time For Goal Achievement: 02/06/22 Potential to Achieve Goals: Fair    Frequency Min 2X/week     Co-evaluation               AM-PAC PT "6 Clicks" Mobility  Outcome Measure Help needed turning from your back to your side while in a flat bed without using bedrails?: A Little Help needed moving from lying on your back to sitting on the side of a flat bed without using bedrails?: A Little   Help needed standing up from a chair using your arms (e.g., wheelchair or bedside chair)?: A Lot Help needed to walk in hospital room?: A Lot Help needed climbing 3-5 steps with a railing? : Total 6 Click Score: 11    End of Session Equipment Utilized During Treatment: Gait belt Activity Tolerance: Patient limited by  pain;Patient limited by fatigue Patient left: in bed;with call bell/phone within reach;with family/visitor present Nurse Communication: Mobility status;Patient requests pain meds PT Visit Diagnosis: Difficulty in walking, not elsewhere classified (R26.2);Muscle weakness (generalized) (M62.81);Pain Pain - Right/Left: Left Pain - part of body:  (Phantom limb pain.)    Time: 3672-5500 PT Time Calculation (min) (ACUTE ONLY): 18 min   Charges:   PT Evaluation $PT Eval Low Complexity: 1 Low          Gwenlyn Saran, PT, DPT 01/23/22, 2:18 PM   Christie Nottingham 01/23/2022, 2:11 PM

## 2022-01-24 LAB — BASIC METABOLIC PANEL
Anion gap: 4 — ABNORMAL LOW (ref 5–15)
BUN: 8 mg/dL (ref 6–20)
CO2: 26 mmol/L (ref 22–32)
Calcium: 8.7 mg/dL — ABNORMAL LOW (ref 8.9–10.3)
Chloride: 113 mmol/L — ABNORMAL HIGH (ref 98–111)
Creatinine, Ser: 0.49 mg/dL (ref 0.44–1.00)
GFR, Estimated: >60 mL/min (ref >60)
Glucose, Bld: 102 mg/dL — ABNORMAL HIGH (ref 70–99)
Potassium: 3.5 mmol/L (ref 3.5–5.1)
Sodium: 143 mmol/L (ref 135–145)

## 2022-01-24 LAB — CBC
HCT: 30 % — ABNORMAL LOW (ref 36.0–46.0)
Hemoglobin: 9.1 g/dL — ABNORMAL LOW (ref 12.0–15.0)
MCH: 24 pg — ABNORMAL LOW (ref 26.0–34.0)
MCHC: 30.3 g/dL (ref 30.0–36.0)
MCV: 79.2 fL — ABNORMAL LOW (ref 80.0–100.0)
Platelets: 414 10*3/uL — ABNORMAL HIGH (ref 150–400)
RBC: 3.79 MIL/uL — ABNORMAL LOW (ref 3.87–5.11)
RDW: 21.2 % — ABNORMAL HIGH (ref 11.5–15.5)
WBC: 6.2 10*3/uL (ref 4.0–10.5)
nRBC: 0 % (ref 0.0–0.2)

## 2022-01-24 LAB — GLUCOSE, CAPILLARY
Glucose-Capillary: 104 mg/dL — ABNORMAL HIGH (ref 70–99)
Glucose-Capillary: 130 mg/dL — ABNORMAL HIGH (ref 70–99)
Glucose-Capillary: 107 mg/dL — ABNORMAL HIGH (ref 70–99)
Glucose-Capillary: 123 mg/dL — ABNORMAL HIGH (ref 70–99)

## 2022-01-24 LAB — CULTURE, BLOOD (ROUTINE X 2)
Culture: NO GROWTH
Culture: NO GROWTH
Special Requests: ADEQUATE
Special Requests: ADEQUATE

## 2022-01-24 MED ORDER — GABAPENTIN 300 MG PO CAPS
300.0000 mg | ORAL_CAPSULE | Freq: Three times a day (TID) | ORAL | Status: DC
  Administered 2022-01-24 – 2022-01-25 (×4): 300 mg via ORAL
  Filled 2022-01-24 (×4): qty 1

## 2022-01-24 MED ORDER — FERROUS SULFATE 325 (65 FE) MG PO TABS
325.0000 mg | ORAL_TABLET | Freq: Every day | ORAL | Status: DC
  Administered 2022-01-24 – 2022-01-25 (×2): 325 mg via ORAL
  Filled 2022-01-24 (×2): qty 1

## 2022-01-24 NOTE — Progress Notes (Signed)
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 PT/OT recs CIR  PAD: with recurrent left leg arterial occlusion from the SFA origin down. Management as per vasc surg   IDA: w/ component of acute blood loss anemia secondary to recent surg. S/p 3 units of pRBCs transfused. H&H are labile   DM2: likely poorly controlled. Continue on SSI w/ accuchecks   HTN: continue on home dose of amlodipine    Acute deep vein thrombosis: of left femoral vein. S/p LLE AKA as per vasc surg    Fever: resolved    Hypomagnesemia: WNL yesterday   Wet gangrene: secondary to acute ischemia due to arterial thrombosis of left leg. S/p L AKA as per vasc surg. Abxs were d/c as per ID     Acute urinary retention: s/p foley remove. Bladder scan prn    Hypokalemia: WNL today   Thrombocytosis: etiology unclear. Will continue to monitor    DVT prophylaxis: eliquis  Code Status: full  Family Communication: does not want me to call any family or friends to give an update to  Disposition Plan:  possibly d/c to CIR   Level of care: Med-Surg   Status is: Inpatient Remains inpatient appropriate because: severity of illness    Consultants:  Vascular surg   Procedures:  Antimicrobials:    Subjective: Pt c/o LLE stump pain   Objective: Vitals:   01/23/22 0742 01/23/22 1633 01/23/22 2032  01/24/22 0455  BP: (!) 144/91 (!) 153/91 131/83 (!) 146/88  Pulse: 78 100 93 94  Resp: 18 18 16 20   Temp: 98.4 F (36.9 C) 97.9 F (36.6 C) 98.4 F (36.9 C) 98.6 F (37 C)  TempSrc: Oral  Oral Oral  SpO2: 98% 99% 97% 94%  Weight:      Height:        Intake/Output Summary (Last 24 hours) at 01/24/2022 0734 Last data filed at 01/24/2022 0500 Gross per 24 hour  Intake --  Output 0 ml  Net 0 ml   Filed Weights   01/17/22 0112 01/17/22 0700  Weight: 81.6 kg 96.3 kg    Examination:  General exam: Appears uncomfortable. Very flat affect  Respiratory system: Clear breath sounds b/l  Cardiovascular system: S1/S2+. No rubs or gallops.  Gastrointestinal system: Abd is soft, NT, ND & normal bowel sounds  Central nervous system: Alert and oriented. Moves all extremities  Psychiatry: Judgement and insight appear normal. Flat mood and affect.    Data Reviewed: I have personally reviewed following labs and imaging studies  CBC: Recent Labs  Lab 01/20/22 0500 01/21/22 0443 01/21/22 0740 01/21/22 2145 01/22/22 0515 01/23/22 0608 01/24/22 0502  WBC 17.6* 12.9*  --   --  12.0* 8.3 6.2  HGB 7.1* 6.5* 6.3* 8.8* 9.3* 9.2* 9.1*  HCT 25.4* 22.6* 22.6* 28.9* 30.5* 30.4* 30.0*  MCV 77.9* 76.6*  --   --  77.2* 78.8* 79.2*  PLT 342 336  --   --  376 389 414*   Basic Metabolic Panel: Recent Labs  Lab 01/18/22 0515 01/19/22 0132 01/20/22 0500 01/21/22 0443 01/22/22 0515 01/23/22 0608 01/24/22 0502  NA 137 137 140 139 143 141 143  K 3.6 3.6 3.3* 4.1 3.6 3.0* 3.5  CL 107 106 111 111 111 110 113*  CO2 23 24 25 23 23 25 26   GLUCOSE 172* 103* 105* 138* 114* 95 102*  BUN 8 7 8 9 7 8 8   CREATININE 0.63 0.59 0.66 0.61 0.69 0.49 0.49  CALCIUM 8.5* 8.2* 8.4* 8.1* 8.5* 8.6* 8.7*  MG 1.5* 1.5* 1.9 1.6* 1.7  --   --    GFR: Estimated Creatinine Clearance: 105.1 mL/min (by C-G formula based on SCr of 0.49 mg/dL). Liver Function Tests: No results for input(s): "AST", "ALT", "ALKPHOS",  "BILITOT", "PROT", "ALBUMIN" in the last 168 hours. No results for input(s): "LIPASE", "AMYLASE" in the last 168 hours. No results for input(s): "AMMONIA" in the last 168 hours. Coagulation Profile: No results for input(s): "INR", "PROTIME" in the last 168 hours.  Cardiac Enzymes: Recent Labs  Lab 01/19/22 1853  CKTOTAL 181   BNP (last 3 results) No results for input(s): "PROBNP" in the last 8760 hours. HbA1C: No results for input(s): "HGBA1C" in the last 72 hours. CBG: Recent Labs  Lab 01/22/22 2133 01/23/22 0743 01/23/22 1148 01/23/22 1634 01/23/22 2106  GLUCAP 149* 91 106* 106* 131*   Lipid Profile: No results for input(s): "CHOL", "HDL", "LDLCALC", "TRIG", "CHOLHDL", "LDLDIRECT" in the last 72 hours. Thyroid Function Tests: No results for input(s): "TSH", "T4TOTAL", "FREET4", "T3FREE", "THYROIDAB" in the last 72 hours. Anemia Panel: No results for input(s): "VITAMINB12", "FOLATE", "FERRITIN", "TIBC", "IRON", "RETICCTPCT" in the last 72 hours. Sepsis Labs: Recent Labs  Lab 01/19/22 1853  PROCALCITON 80.60    Recent Results (from the past 240 hour(s))  Culture, blood (Routine X 2) w Reflex to ID Panel     Status: None   Collection Time: 01/19/22 11:28 AM   Specimen: BLOOD RIGHT FOREARM  Result Value Ref Range Status   Specimen Description BLOOD RIGHT FOREARM  Final   Special Requests   Final    BOTTLES DRAWN AEROBIC AND ANAEROBIC Blood Culture adequate volume   Culture   Final    NO GROWTH 5 DAYS Performed at [facility], [address] [place], [address] [place]    Report Status 01/24/2022 FINAL  Final  Culture, blood (Routine X 2) w Reflex to ID Panel     Status: None   Collection Time: 01/19/22 11:32 AM   Specimen: BLOOD RIGHT HAND  Result Value Ref Range Status   Specimen Description BLOOD RIGHT HAND  Final   Special Requests   Final    BOTTLES DRAWN AEROBIC AND ANAEROBIC Blood Culture adequate volume   Culture   Final    NO GROWTH 5  DAYS Performed at [facility], [address] [place], [address] [place]    Report Status 01/24/2022 FINAL  Final  Aerobic Culture w Gram Stain (superficial specimen)     Status: None   Collection Time: 01/19/22  6:53 PM   Specimen: Wound  Result Value Ref Range Status   Specimen Description   Final    WOUND Performed at [facility], [address] [place], [address] [place]    Special Requests   Final    LL Performed at [facility], [address] [place], [place] [address]    Gram Stain NO WBC SEEN NO ORGANISMS SEEN   Final   Culture   Final    NO GROWTH 2 DAYS Performed at [facility], [address] [place], [place] [address]    Report Status 01/22/2022 FINAL  Final         Radiology Studies: No results found.      Scheduled Meds:  sodium chloride   Intravenous Once   amLODipine  5 mg Oral Daily   apixaban  5 mg Oral BID   Chlorhexidine Gluconate Cloth  6 each Topical Q0600   insulin aspart  0-15 Units Subcutaneous TID WC   insulin aspart  0-5 Units Subcutaneous QHS   nicotine  21 mg Transdermal Daily   Continuous Infusions:  sodium chloride Stopped (01/17/22 1835)   sodium chloride       LOS: 7 days    Time spent: 25 mins     Charise Killian, MD [facility] Pager [phone]  If 7PM-7AM, please contact night-coverage [email or web address] 01/24/2022, 7:34 AM

## 2022-01-24 NOTE — Progress Notes (Addendum)
Inpatient Rehab Admissions:  Inpatient Rehab Consult received.  I spoke with patient on the telephone for rehabilitation assessment and to discuss goals and expectations of an inpatient rehab admission.  Pt acknowledged understanding of CIR goals and expectations. Pt is interested in pursuing CIR. Discussed cost of CIR when pt's are uninsured. Pt acknowledged understanding. Pt gave permission to contact pt's daughter Gavin Pound. Spoke with Gavin Pound on the telephone. She also acknowledged understanding of CIR goals and expectations. She is supportive of pt pursuing CIR. She confirmed that she will be able to provide support for pt after discharge. Will continue to follow.  Signed: Wolfgang Phoenix, MS, CCC-SLP Admissions Coordinator [phone]

## 2022-01-24 NOTE — Progress Notes (Signed)
4 Days Post-Op   Subjective/Chief Complaint: Doing well. Worked with PT yesterday. Pain controlled   Objective: Vital signs in last 24 hours: Temp:  [97.9 F (36.6 C)-98.6 F (37 C)] 98.6 F (37 C) (07/16 0455) Pulse Rate:  [93-100] 94 (07/16 0455) Resp:  [16-20] 20 (07/16 0455) BP: (131-153)/(83-91) 146/88 (07/16 0455) SpO2:  [94 %-99 %] 94 % (07/16 0455) Last BM Date : 01/23/22  Intake/Output from previous day: No intake/output data recorded. Intake/Output this shift: No intake/output data recorded.  General appearance: alert and no distress Cardio: regular rate and rhythm Extremities: LEFT AKA stump- soft, + swelling, appropriately tender, incision- C/D/I  Lab Results:  Recent Labs    01/23/22 0608 01/24/22 0502  WBC 8.3 6.2  HGB 9.2* 9.1*  HCT 30.4* 30.0*  PLT 389 414*   BMET Recent Labs    01/23/22 0608 01/24/22 0502  NA 141 143  K 3.0* 3.5  CL 110 113*  CO2 25 26  GLUCOSE 95 102*  BUN 8 8  CREATININE 0.49 0.49  CALCIUM 8.6* 8.7*   PT/INR No results for input(s): "LABPROT", "INR" in the last 72 hours. ABG No results for input(s): "PHART", "HCO3" in the last 72 hours.  Invalid input(s): "PCO2", "PO2"  Studies/Results: No results found.  Anti-infectives: Anti-infectives (From admission, onward)    Start     Dose/Rate Route Frequency Ordered Stop   01/20/22 0600  ceFAZolin (ANCEF) IVPB 2g/100 mL premix  Status:  Discontinued        2 g 200 mL/hr over 30 Minutes Intravenous On call to O.R. 01/20/22 0347 01/20/22 1836   01/19/22 2000  DAPTOmycin (CUBICIN) 450 mg in sodium chloride 0.9 % IVPB  Status:  Discontinued        6 mg/kg  72.7 kg (Adjusted) 118 mL/hr over 30 Minutes Intravenous Daily 01/19/22 1849 01/22/22 1539   01/19/22 1400  piperacillin-tazobactam (ZOSYN) IVPB 3.375 g  Status:  Discontinued        3.375 g 12.5 mL/hr over 240 Minutes Intravenous Every 8 hours 01/19/22 1036 01/22/22 1539   01/18/22 1600  ceFAZolin (ANCEF) IVPB  2g/100 mL premix        2 g 200 mL/hr over 30 Minutes Intravenous  Once 01/18/22 1553 01/18/22 1753   01/17/22 0455  ceFAZolin (ANCEF) IVPB 1 g/50 mL premix        over 30 Minutes  Continuous PRN 01/17/22 0455 01/17/22 0607       Assessment/Plan: s/p Procedure(s): AMPUTATION ABOVE KNEE (Left) PERIPHERAL VASCULAR THROMBECTOMY Left Femoral vein (Left)   POD #4 s/p left AKA, venous thrombectomy  OOB with PT Rehab Planning Monitor AKA stump   LOS: 7 days    Eli Hose A 01/24/2022

## 2022-01-24 NOTE — Progress Notes (Signed)
Physical Therapy Treatment Patient Details Name: Jocelyn [name] MRN: [id number] DOB: 1980/12/11 Today's Date: 01/24/2022   History of Present Illness Pt is a 41 year old female admitted with severe pain of the left leg associated with the appearance of blisters on the leg. Pt had the following procedures: ? 07/09 LLE Angiogram with angioplasty left SFA, posterior tibial artery, attempted penumbra thrombectomy left SFA, placement of lytic infusion catheter left posterior tibial artery 07/10: LLE angiogram, mechanical thrombectomy. Noted continued occlusion of the left SFA, popliteal artery, and all 3 tibial vessels with no runoff seen distally on initial imaging or after attempts at intervention 07/12: LLE AKA amputation;    PT Comments    Pt received in bed. Educated about proper positioning of L AKA to prevent contractures as well as techniques to decrease pain. Discussed benefits of CIR, pt remains an excellent candidate once medically stable. Good tolerance for out of bed to chair today.   Recommendations for follow up therapy are one component of a multi-disciplinary discharge planning process, led by the attending physician.  Recommendations may be updated based on patient status, additional functional criteria and insurance authorization.  Follow Up Recommendations  Acute inpatient rehab (3hours/day) Can patient physically be transported by private vehicle: No   Assistance Recommended at Discharge Frequent or constant Supervision/Assistance  Patient can return home with the following A lot of help with walking and/or transfers;A lot of help with bathing/dressing/bathroom;Assist for transportation;Help with stairs or ramp for entrance   Equipment Recommendations   (TBD at next facility)    Recommendations for Other Services       Precautions / Restrictions Precautions Precautions: Fall Restrictions Weight Bearing Restrictions: No     Mobility  Bed Mobility Overal bed mobility:  Needs Assistance Bed Mobility: Supine to Sit Rolling: Min assist   Supine to sit: Min guard, HOB elevated Sit to supine: Min assist   General bed mobility comments: pt upright on BSC as part of OT hand-off.  Pt returned to the recliner.    Transfers Overall transfer level: Needs assistance Equipment used: Rolling walker (2 wheels) Transfers: Sit to/from Stand Sit to Stand: Min assist           General transfer comment: step by step vcs for approrpaite RW use    Ambulation/Gait Ambulation/Gait assistance: Min guard Gait Distance (Feet): 2 Feet Assistive device: Rolling walker (2 wheels) Gait Pattern/deviations: Step-to pattern, Decreased step length - right, Decreased step length - left, Decreased stance time - left, Antalgic           Stairs             Wheelchair Mobility    Modified Rankin (Stroke Patients Only)       Balance Overall balance assessment: Needs assistance Sitting-balance support: Bilateral upper extremity supported Sitting balance-Leahy Scale: Fair     Standing balance support: Bilateral upper extremity supported, During functional activity Standing balance-Leahy Scale: Poor                              Cognition Arousal/Alertness: Awake/alert Behavior During Therapy: WFL for tasks assessed/performed Overall Cognitive Status: Within Functional Limits for tasks assessed                                 General Comments: pt much more present during treatment session today.  Exercises      General Comments General comments (skin integrity, edema, etc.): Education provided regarding proper positioning of Left stump, Pt will benefit from continued education      Pertinent Vitals/Pain Pain Assessment Pain Assessment: 0-10 Pain Score: 5  Pain Location: LLE Pain Descriptors / Indicators: Aching, Grimacing Pain Intervention(s): Monitored during session, Premedicated before session    Home  Living   Living Arrangements: Children Available Help at Discharge: Family;Available 24 hours/day Type of Home: Apartment Home Access: Stairs to enter Entrance Stairs-Rails: Doctor, general practice of Steps: 9-10   Home Layout: One level        Prior Function            PT Goals (current goals can now be found in the care plan section) Acute Rehab PT Goals Patient Stated Goal: to get stronger    Frequency    Min 2X/week      PT Plan Current plan remains appropriate    Co-evaluation              AM-PAC PT "6 Clicks" Mobility   Outcome Measure  Help needed turning from your back to your side while in a flat bed without using bedrails?: A Little Help needed moving from lying on your back to sitting on the side of a flat bed without using bedrails?: A Little Help needed moving to and from a bed to a chair (including a wheelchair)?: A Lot Help needed standing up from a chair using your arms (e.g., wheelchair or bedside chair)?: A Lot Help needed to walk in hospital room?: A Lot Help needed climbing 3-5 steps with a railing? : Total 6 Click Score: 13    End of Session Equipment Utilized During Treatment: Gait belt Activity Tolerance: Patient limited by pain;Patient limited by fatigue Patient left: in chair;with call bell/phone within reach Nurse Communication: Mobility status;Patient requests pain meds PT Visit Diagnosis: Difficulty in walking, not elsewhere classified (R26.2);Muscle weakness (generalized) (M62.81);Pain Pain - Right/Left: Left Pain - part of body: Leg     Time: 9323-5573 PT Time Calculation (min) (ACUTE ONLY): 30 min  Charges:  $Therapeutic Activity: 23-37 mins                    Jocelyn [name], PTA    Jocelyn [name] 01/24/2022, 1:29 PM

## 2022-01-25 ENCOUNTER — Encounter (HOSPITAL_COMMUNITY): Payer: Self-pay | Admitting: Physical Medicine and Rehabilitation

## 2022-01-25 ENCOUNTER — Inpatient Hospital Stay (HOSPITAL_COMMUNITY)
Admission: RE | Admit: 2022-01-25 | Discharge: 2022-02-02 | DRG: 560 | Disposition: A | Discharge status: Home / Self Care | Payer: Medicaid Other | Source: Other Acute Inpatient Hospital | Attending: Physical Medicine and Rehabilitation | Admitting: Physical Medicine and Rehabilitation

## 2022-01-25 ENCOUNTER — Other Ambulatory Visit: Payer: Self-pay

## 2022-01-25 DIAGNOSIS — D509 Iron deficiency anemia, unspecified: Secondary | ICD-10-CM | POA: Diagnosis not present

## 2022-01-25 DIAGNOSIS — E1151 Type 2 diabetes mellitus with diabetic peripheral angiopathy without gangrene: Secondary | ICD-10-CM | POA: Diagnosis present

## 2022-01-25 DIAGNOSIS — G546 Phantom limb syndrome with pain: Secondary | ICD-10-CM | POA: Diagnosis present

## 2022-01-25 DIAGNOSIS — D62 Acute posthemorrhagic anemia: Secondary | ICD-10-CM | POA: Diagnosis not present

## 2022-01-25 DIAGNOSIS — I82412 Acute embolism and thrombosis of left femoral vein: Secondary | ICD-10-CM | POA: Diagnosis not present

## 2022-01-25 DIAGNOSIS — Z7901 Long term (current) use of anticoagulants: Secondary | ICD-10-CM

## 2022-01-25 DIAGNOSIS — I739 Peripheral vascular disease, unspecified: Secondary | ICD-10-CM | POA: Diagnosis not present

## 2022-01-25 DIAGNOSIS — E876 Hypokalemia: Secondary | ICD-10-CM | POA: Diagnosis not present

## 2022-01-25 DIAGNOSIS — T502X5D Adverse effect of carbonic-anhydrase inhibitors, benzothiadiazides and other diuretics, subsequent encounter: Secondary | ICD-10-CM | POA: Diagnosis not present

## 2022-01-25 DIAGNOSIS — F17210 Nicotine dependence, cigarettes, uncomplicated: Secondary | ICD-10-CM | POA: Diagnosis not present

## 2022-01-25 DIAGNOSIS — Z8249 Family history of ischemic heart disease and other diseases of the circulatory system: Secondary | ICD-10-CM | POA: Diagnosis not present

## 2022-01-25 DIAGNOSIS — E669 Obesity, unspecified: Secondary | ICD-10-CM | POA: Diagnosis not present

## 2022-01-25 DIAGNOSIS — Z20822 Contact with and (suspected) exposure to covid-19: Secondary | ICD-10-CM | POA: Diagnosis present

## 2022-01-25 DIAGNOSIS — Z79899 Other long term (current) drug therapy: Secondary | ICD-10-CM

## 2022-01-25 DIAGNOSIS — R5381 Other malaise: Secondary | ICD-10-CM | POA: Diagnosis present

## 2022-01-25 DIAGNOSIS — Z4781 Encounter for orthopedic aftercare following surgical amputation: Secondary | ICD-10-CM | POA: Diagnosis not present

## 2022-01-25 DIAGNOSIS — Z89612 Acquired absence of left leg above knee: Secondary | ICD-10-CM | POA: Diagnosis not present

## 2022-01-25 DIAGNOSIS — I10 Essential (primary) hypertension: Secondary | ICD-10-CM | POA: Diagnosis not present

## 2022-01-25 DIAGNOSIS — Z716 Tobacco abuse counseling: Secondary | ICD-10-CM

## 2022-01-25 DIAGNOSIS — G4700 Insomnia, unspecified: Secondary | ICD-10-CM | POA: Diagnosis not present

## 2022-01-25 DIAGNOSIS — E1169 Type 2 diabetes mellitus with other specified complication: Secondary | ICD-10-CM | POA: Diagnosis not present

## 2022-01-25 DIAGNOSIS — I70222 Atherosclerosis of native arteries of extremities with rest pain, left leg: Secondary | ICD-10-CM | POA: Diagnosis not present

## 2022-01-25 LAB — GLUCOSE, CAPILLARY
Glucose-Capillary: 115 mg/dL — ABNORMAL HIGH (ref 70–99)
Glucose-Capillary: 106 mg/dL — ABNORMAL HIGH (ref 70–99)
Glucose-Capillary: 106 mg/dL — ABNORMAL HIGH (ref 70–99)
Glucose-Capillary: 112 mg/dL — ABNORMAL HIGH (ref 70–99)

## 2022-01-25 LAB — CBC
HCT: 31.7 % — ABNORMAL LOW (ref 36.0–46.0)
Hemoglobin: 9.3 g/dL — ABNORMAL LOW (ref 12.0–15.0)
MCH: 23.5 pg — ABNORMAL LOW (ref 26.0–34.0)
MCHC: 29.3 g/dL — ABNORMAL LOW (ref 30.0–36.0)
MCV: 80.3 fL (ref 80.0–100.0)
Platelets: 474 10*3/uL — ABNORMAL HIGH (ref 150–400)
RBC: 3.95 MIL/uL (ref 3.87–5.11)
RDW: 21.8 % — ABNORMAL HIGH (ref 11.5–15.5)
WBC: 4.8 10*3/uL (ref 4.0–10.5)
nRBC: 0 % (ref 0.0–0.2)

## 2022-01-25 LAB — BASIC METABOLIC PANEL
Anion gap: 6 (ref 5–15)
BUN: 9 mg/dL (ref 6–20)
CO2: 26 mmol/L (ref 22–32)
Calcium: 9.1 mg/dL (ref 8.9–10.3)
Chloride: 112 mmol/L — ABNORMAL HIGH (ref 98–111)
Creatinine, Ser: 0.44 mg/dL (ref 0.44–1.00)
GFR, Estimated: >60 mL/min (ref >60)
Glucose, Bld: 101 mg/dL — ABNORMAL HIGH (ref 70–99)
Potassium: 3.7 mmol/L (ref 3.5–5.1)
Sodium: 144 mmol/L (ref 135–145)

## 2022-01-25 LAB — HEMOGLOBIN A1C
Hgb A1c MFr Bld: 5.5 % (ref 4.8–5.6)
Mean Plasma Glucose: 111.15 mg/dL

## 2022-01-25 MED ORDER — PROCHLORPERAZINE 25 MG RE SUPP: 12.5000 mg | Freq: Four times a day (QID) | RECTAL | Status: DC | PRN

## 2022-01-25 MED ORDER — PROCHLORPERAZINE EDISYLATE 10 MG/2ML IJ SOLN: 5.0000 mg | Freq: Four times a day (QID) | INTRAMUSCULAR | Status: DC | PRN

## 2022-01-25 MED ORDER — NICOTINE 21 MG/24HR TD PT24
21.0000 mg | MEDICATED_PATCH | Freq: Every day | TRANSDERMAL | Status: DC
Start: 2022-01-26 — End: 2022-02-02
  Filled 2022-01-25 (×4): qty 1

## 2022-01-25 MED ORDER — HYDROCHLOROTHIAZIDE 25 MG PO TABS: 25.0000 mg | ORAL_TABLET | Freq: Every day | ORAL | 0 refills | Status: DC

## 2022-01-25 MED ORDER — INSULIN ASPART 100 UNIT/ML IJ SOLN: 0.0000 [IU] | Freq: Every day | INTRAMUSCULAR | Status: DC

## 2022-01-25 MED ORDER — TRAZODONE HCL 50 MG PO TABS
25.0000 mg | ORAL_TABLET | Freq: Every evening | ORAL | Status: DC | PRN
  Administered 2022-01-25 – 2022-02-01 (×7): 50 mg via ORAL
  Filled 2022-01-25 (×7): qty 1

## 2022-01-25 MED ORDER — HYDROCODONE-ACETAMINOPHEN 5-325 MG PO TABS
1.0000 | ORAL_TABLET | ORAL | Status: DC | PRN
  Administered 2022-01-25 – 2022-01-27 (×9): 2 via ORAL
  Filled 2022-01-25 (×10): qty 2

## 2022-01-25 MED ORDER — MAGNESIUM HYDROXIDE 400 MG/5ML PO SUSP: 30.0000 mL | Freq: Every day | ORAL | Status: DC | PRN

## 2022-01-25 MED ORDER — FERROUS SULFATE 325 (65 FE) MG PO TABS
325.0000 mg | ORAL_TABLET | Freq: Every day | ORAL | Status: DC
  Administered 2022-01-26 – 2022-02-02 (×8): 325 mg via ORAL
  Filled 2022-01-25 (×8): qty 1

## 2022-01-25 MED ORDER — DIPHENHYDRAMINE HCL 12.5 MG/5ML PO ELIX
12.5000 mg | ORAL_SOLUTION | Freq: Four times a day (QID) | ORAL | Status: DC | PRN
  Administered 2022-01-25 – 2022-01-26 (×2): 25 mg via ORAL
  Filled 2022-01-25 (×2): qty 10

## 2022-01-25 MED ORDER — ACETAMINOPHEN 325 MG PO TABS
325.0000 mg | ORAL_TABLET | ORAL | Status: DC | PRN
  Administered 2022-01-26 – 2022-02-02 (×6): 650 mg via ORAL
  Filled 2022-01-25 (×9): qty 2

## 2022-01-25 MED ORDER — GABAPENTIN 300 MG PO CAPS
300.0000 mg | ORAL_CAPSULE | Freq: Three times a day (TID) | ORAL | Status: DC
  Administered 2022-01-25: 300 mg via ORAL
  Filled 2022-01-25: qty 1

## 2022-01-25 MED ORDER — GABAPENTIN 400 MG PO CAPS
400.0000 mg | ORAL_CAPSULE | Freq: Three times a day (TID) | ORAL | Status: DC
  Administered 2022-01-25 – 2022-01-27 (×5): 400 mg via ORAL
  Filled 2022-01-25 (×5): qty 1

## 2022-01-25 MED ORDER — GUAIFENESIN-DM 100-10 MG/5ML PO SYRP: 5.0000 mL | ORAL_SOLUTION | Freq: Four times a day (QID) | ORAL | Status: DC | PRN

## 2022-01-25 MED ORDER — INSULIN ASPART 100 UNIT/ML IJ SOLN
0.0000 [IU] | Freq: Three times a day (TID) | INTRAMUSCULAR | Status: DC
  Administered 2022-01-26: 3 [IU] via SUBCUTANEOUS
  Administered 2022-01-26: 2 [IU] via SUBCUTANEOUS

## 2022-01-25 MED ORDER — APIXABAN 5 MG PO TABS
5.0000 mg | ORAL_TABLET | Freq: Two times a day (BID) | ORAL | Status: DC
  Administered 2022-01-25 – 2022-02-02 (×16): 5 mg via ORAL
  Filled 2022-01-25 (×16): qty 1

## 2022-01-25 MED ORDER — FLEET ENEMA 7-19 GM/118ML RE ENEM: 1.0000 | ENEMA | Freq: Once | RECTAL | Status: DC | PRN

## 2022-01-25 MED ORDER — BISACODYL 5 MG PO TBEC: 5.0000 mg | DELAYED_RELEASE_TABLET | Freq: Every day | ORAL | Status: DC | PRN

## 2022-01-25 MED ORDER — PROCHLORPERAZINE MALEATE 5 MG PO TABS: 5.0000 mg | ORAL_TABLET | Freq: Four times a day (QID) | ORAL | Status: DC | PRN

## 2022-01-25 MED ORDER — HYDROCHLOROTHIAZIDE 25 MG PO TABS
25.0000 mg | ORAL_TABLET | Freq: Every day | ORAL | Status: DC
  Administered 2022-01-25: 25 mg via ORAL
  Filled 2022-01-25: qty 1

## 2022-01-25 MED ORDER — HYDROCHLOROTHIAZIDE 25 MG PO TABS
25.0000 mg | ORAL_TABLET | Freq: Every day | ORAL | Status: DC
  Administered 2022-01-26 – 2022-02-02 (×8): 25 mg via ORAL
  Filled 2022-01-25 (×8): qty 1

## 2022-01-25 MED ORDER — ALUM & MAG HYDROXIDE-SIMETH 200-200-20 MG/5ML PO SUSP: 30.0000 mL | ORAL | Status: DC | PRN

## 2022-01-25 MED ORDER — METHOCARBAMOL 500 MG PO TABS
500.0000 mg | ORAL_TABLET | Freq: Four times a day (QID) | ORAL | Status: DC | PRN
  Administered 2022-01-25 – 2022-02-02 (×26): 500 mg via ORAL
  Filled 2022-01-25 (×28): qty 1

## 2022-01-25 MED ORDER — HYDROCODONE-ACETAMINOPHEN 5-325 MG PO TABS: 1.0000 | ORAL_TABLET | ORAL | 0 refills | Status: DC | PRN

## 2022-01-25 MED ORDER — INSULIN ASPART 100 UNIT/ML IJ SOLN: 0.0000 [IU] | Freq: Three times a day (TID) | INTRAMUSCULAR | Status: DC

## 2022-01-25 NOTE — TOC Transition Note (Signed)
Transition of Care [facility]) - CM/SW Discharge Note   Patient Details  Name: Jocelyn [name] MRN: [id number] Date of Birth: February 06, 1981  Transition of [facility]) CM/SW Contact:  Chapman Fitch, RN Phone Number: 01/25/2022, 11:50 AM   Clinical Narrative:      Patient to transfer to CIR today.   Admissions coordinator to coordinating transfer   [facility] packet printed to floor.  Bedside notified and she has updated the patient    Barriers to Discharge: Continued Medical Work up   Patient Goals and CMS Choice Patient states their goals for this hospitalization and ongoing recovery are:: patient does not state goals, she is withdrawn and depressed      Discharge Placement                       Discharge Plan and Services                DME Arranged: N/A DME Agency: NA                  Social Determinants of Health (SDOH) Interventions     Readmission Risk Interventions    01/18/2022   11:07 AM  Readmission Risk Prevention Plan  Transportation Screening Complete  PCP or Specialist Appt within 5-7 Days Complete  Home Care Screening Complete  Medication Review (RN CM) Complete

## 2022-01-25 NOTE — Progress Notes (Signed)
Report called to [facility] at [facility], transport arranged via carelink

## 2022-01-25 NOTE — Progress Notes (Signed)
Inpatient Rehabilitation Admission Medication Review by a Pharmacist  A complete drug regimen review was completed for this patient to identify any potential clinically significant medication issues.  High Risk Drug Classes Is patient taking? Indication by Medication  Antipsychotic Yes Compazine- N/V  Anticoagulant Yes Apixaban- acute and Hx thrombosis in-stent and LLE  Antibiotic No   Opioid Yes Norco- acute pain  Antiplatelet No   Hypoglycemics/insulin Yes Insulin- CBG control  Vasoactive Medication Yes HCTZ- HTN  Chemotherapy No   Other Yes Robaxin- muscle spasms Trazodone- sleep Gabapentin- neuropathic pain Iron- anemia     Type of Medication Issue Identified Description of Issue Recommendation(s)  Drug Interaction(s) (clinically significant)     Duplicate Therapy     Allergy     No Medication Administration End Date     Incorrect Dose     Additional Drug Therapy Needed     Significant med changes from prior encounter (inform family/care partners about these prior to discharge).    Other  PTA meds: Norvasc Colace Miralax Restart PTA meds when and if necessary during CIR admission or at time of discharge, if warranted     Clinically significant medication issues were identified that warrant physician communication and completion of prescribed/recommended actions by midnight of the next day:  No   Time spent performing this drug regimen review (minutes):  30   Jocelyn [name] BS, PharmD, BCPS Clinical Pharmacist 01/25/2022 2:56 PM  Contact: [phone] after 3 PM  "Be curious, not judgmental..." -Debbora Dus

## 2022-01-25 NOTE — PMR Pre-admission (Signed)
PMR Admission Coordinator Pre-Admission Assessment   Patient: Jocelyn [name] is an 41 y.o., female MRN: [id number] DOB: 1981-05-14 Height: 5\' 5"  (165.1 cm) Weight: 96.3 kg   Insurance Information HMO:     PPO:      PCP:      IPA:      80/20:      OTHER:  PRIMARY: UNINSURED   Gave pt self-pay estimate on 7/17 and reviewed on the telephone  Policy#:       Subscriber:  CM Name:       Phone#:      Fax#:  Pre-Cert#:       Employer:  Benefits:  Phone #:      Name:  Eff. Date:      Deduct:       Out of Pocket Max:       Life Max:  CIR:       SNF:  Outpatient:      Co-Pay:  Home Health:       Co-Pay:  DME:      Co-Pay:  Providers:  SECONDARY:       Policy#:      Phone#:    Financial Counselor: 8/17      Phone#:    The "Data Collection Information Summary" for patients in Inpatient Rehabilitation Facilities with attached "Privacy Act Statement-Health Care Records" was provided and verbally reviewed with: N/A   Emergency Contact Information Contact Information       Name Relation Home Work Mobile    [place] Mother     [phone]    [place], [place] Daughter     [phone]           Current Medical History  Patient Admitting Diagnosis: left ischemia limb, s/p L AKA History of Present Illness: Pt is a 41 year old female with medical hx significant for: PAD, s/p LLE angiogram with stent placement (01/11/20), s/p thrombolytic therapy and angiogram (03/07/20), mechanical thrombectomy, angioplasty (01/07/22), DM, HTN, iron deficiency anemia, tobacco use. Pt presented to [facility] on 01/17/22 d/t severe left lower leg pain and swelling.Pt also reported blisters on left leg and SOB with exertion. Pt recently hospitalized 01/05/22-01/09/22 d/t left lower extremity ischemia. Venous doppler showed significant occlusive thrombus from popliteal vein to the common femoral vein. CTA revealed acute arterial occlusion of the left SFA. Pt had LLE angiogram with angioplasty left SFA,  posterior tibial artery, attempted penumbra thrombectomy left SFA, and placement of lytic infusion catheter in the left posterior tibial artery on 01/17/22. Pt Hgb 7, transfused 1 unit of PRBC on 7/9.  Pt had LLE angiogram and mechanical thrombectomy on 01/18/22. 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 Pt Hgb 6.3, transfused 2 units PRBC on 7/13. Therapy evaluations completed and CIR recommended d/t pt's deficits in mobility and inability to complete ADLs independently.    Patient's medical record from [facility] has been reviewed by the rehabilitation admission coordinator and physician.   Past Medical History      Past Medical History:  Diagnosis Date   IDA (iron deficiency anemia) 12/17/2019      Has the patient had major surgery during 100 days prior to admission? Yes   Family History   family history includes Hypertension in her brother, mother, and sister.   Current Medications   Current Facility-Administered Medications:    0.9 %  sodium chloride infusion, , Intravenous, PRN, 02/16/2020, MD, Stopped at 01/17/22 1835   0.9 %  sodium  chloride infusion, 10 mL/hr, Intravenous, Once, Dew, Marlow Baars, MD   acetaminophen (TYLENOL) tablet 650 mg, 650 mg, Oral, Q6H PRN, 650 mg at 01/19/22 0756 **OR** acetaminophen (TYLENOL) suppository 650 mg, 650 mg, Rectal, Q6H PRN, Wyn Quaker, Marlow Baars, MD   apixaban (ELIQUIS) tablet 5 mg, 5 mg, Oral, BID, Beers, Sherlynn Carbon, RPH, 5 mg at 01/25/22 0957   Chlorhexidine Gluconate Cloth 2 % PADS 6 each, 6 each, Topical, Q0600, Annice Needy, MD, 6 each at 01/24/22 1000   diphenhydrAMINE (BENADRYL) injection 12.5 mg, 12.5 mg, Intravenous, Q6H PRN, 12.5 mg at 01/22/22 0454 **OR** diphenhydrAMINE (BENADRYL) 12.5 MG/5ML elixir 12.5 mg, 12.5 mg, Oral, Q6H PRN, Wyn Quaker, Marlow Baars, MD   ferrous sulfate tablet 325 mg, 325 mg, Oral, Q breakfast, Charise Killian, MD, 325 mg at 01/25/22 0710   gabapentin (NEURONTIN) capsule 300 mg, 300 mg, Oral, TID, Charise Killian, MD, 300  mg at 01/25/22 0957   hydrochlorothiazide (HYDRODIURIL) tablet 25 mg, 25 mg, Oral, Daily, Charise Killian, MD, 25 mg at 01/25/22 0957   HYDROcodone-acetaminophen (NORCO/VICODIN) 5-325 MG per tablet 1-2 tablet, 1-2 tablet, Oral, Q4H PRN, Annice Needy, MD, 2 tablet at 01/25/22 0710   insulin aspart (novoLOG) injection 0-15 Units, 0-15 Units, Subcutaneous, TID WC, Annice Needy, MD, 2 Units at 01/24/22 1305   insulin aspart (novoLOG) injection 0-5 Units, 0-5 Units, Subcutaneous, QHS, Dew, Marlow Baars, MD, 2 Units at 01/20/22 2203   morphine (PF) 2 MG/ML injection 2 mg, 2 mg, Intravenous, Q2H PRN, Sunnie Nielsen, DO, 2 mg at 01/24/22 2101   naloxone (NARCAN) injection 0.4 mg, 0.4 mg, Intravenous, PRN **AND** sodium chloride flush (NS) 0.9 % injection 9 mL, 9 mL, Intravenous, PRN, Dew, Marlow Baars, MD   nicotine (NICODERM CQ - dosed in mg/24 hours) patch 21 mg, 21 mg, Transdermal, Daily, Alexander, Natalie, DO   ondansetron (ZOFRAN) tablet 4 mg, 4 mg, Oral, Q6H PRN **OR** ondansetron (ZOFRAN) injection 4 mg, 4 mg, Intravenous, Q6H PRN, Dew, Marlow Baars, MD   Oral care mouth rinse, 15 mL, Mouth Rinse, PRN, Wyn Quaker, Marlow Baars, MD   Patients Current Diet:  Diet Order                  Diet regular Room service appropriate? Yes; Fluid consistency: Thin  Diet effective now                         Precautions / Restrictions Precautions Precautions: Fall Restrictions Weight Bearing Restrictions: Yes LLE Weight Bearing: Non weight bearing    Has the patient had 2 or more falls or a fall with injury in the past year? No   Prior Activity Level Community (5-7x/wk): drives, works, gets out of house daily   Prior Functional Level Self Care: Did the patient need help bathing, dressing, using the toilet or eating? Independent   Indoor Mobility: Did the patient need assistance with walking from room to room (with or without device)? Independent   Stairs: Did the patient need assistance with internal or  external stairs (with or without device)? Independent   Functional Cognition: Did the patient need help planning regular tasks such as shopping or remembering to take medications? Independent   Patient Information Are you of Hispanic, Latino/a,or Spanish origin?: A. No, not of Hispanic, Latino/a, or Spanish origin What is your race?: B. Black or African American Do you need or want an interpreter to communicate with a doctor or health care staff?:  0. No   Patient's Response To:  Health Literacy and Transportation Is the patient able to respond to health literacy and transportation needs?: Yes Health Literacy - How often do you need to have someone help you when you read instructions, pamphlets, or other written material from your doctor or pharmacy?: Never In the past 12 months, has lack of transportation kept you from medical appointments or from getting medications?: No In the past 12 months, has lack of transportation kept you from meetings, work, or from getting things needed for daily living?: No   Journalist, newspaper / Equipment Home Assistive Devices/Equipment: None Home Equipment: Agricultural consultant (2 wheels), BSC/3in1   Prior Device Use: Indicate devices/aids used by the patient prior to current illness, exacerbation or injury? Walker   Current Functional Level Cognition   Overall Cognitive Status: Within Functional Limits for tasks assessed Orientation Level: Oriented X4 General Comments: pt much more present during treatment session today.    Extremity Assessment (includes Sensation/Coordination)   Upper Extremity Assessment: Overall WFL for tasks assessed  Lower Extremity Assessment: LLE deficits/detail RLE Deficits / Details: AROM is grossly WFL. endurance likely impaired for sustained activity LLE Deficits / Details: s/p AKA LLE: Unable to fully assess due to pain LLE Sensation:  (absent light touch on the bottom of the foot. decreased light touch distally>  proximally)     ADLs   Overall ADL's : Needs assistance/impaired Eating/Feeding: Set up, Sitting Grooming: Wash/dry hands, Wash/dry face, Sitting, Set up Upper Body Dressing : Minimal assistance, Sitting Lower Body Dressing: Maximal assistance Toilet Transfer: Minimal assistance, Rolling walker (2 wheels), BSC/3in1 Toilet Transfer Details (indicate cue type and reason): SPT to bsc with step by step vcs Toileting- Clothing Manipulation and Hygiene: Minimal assistance, Sit to/from stand, Sitting/lateral lean     Mobility   Overal bed mobility: Needs Assistance Bed Mobility: Supine to Sit Rolling: Min assist Supine to sit: Min guard, HOB elevated Sit to supine: Min assist General bed mobility comments: pt upright on BSC as part of OT hand-off.  Pt returned to the recliner.     Transfers   Overall transfer level: Needs assistance Equipment used: Rolling walker (2 wheels) Transfers: Sit to/from Stand Sit to Stand: Min assist General transfer comment: step by step vcs for approrpaite RW use     Ambulation / Gait / Stairs / Wheelchair Mobility   Ambulation/Gait Ambulation/Gait assistance: Land (Feet): 2 Feet Assistive device: Rolling walker (2 wheels) Gait Pattern/deviations: Step-to pattern, Decreased step length - right, Decreased step length - left, Decreased stance time - left, Antalgic     Posture / Balance Balance Overall balance assessment: Needs assistance Sitting-balance support: Bilateral upper extremity supported Sitting balance-Leahy Scale: Fair Standing balance support: Bilateral upper extremity supported, During functional activity Standing balance-Leahy Scale: Poor     Special needs/care consideration Skin Surgical incision: thigh/left, External urinary catheter and Diabetic management novoLOG 0-5 units daily at bedtime; novoLOG 0-15 units 3x daily at meals    Previous Home Environment (from acute therapy documentation) Living Arrangements:  Children Available Help at Discharge: Family, Available 24 hours/day Type of Home: Apartment Home Layout: One level Home Access: Stairs to enter Entrance Stairs-Rails: Right, Left Entrance Stairs-Number of Steps: 9-10 Bathroom Shower/Tub: Engineer, manufacturing systems: Standard Bathroom Accessibility: Yes How Accessible: Accessible via walker Home Care Services: No   Discharge Living Setting Plans for Discharge Living Setting: Patient's home Type of Home at Discharge: Apartment Discharge Home Layout: One level Discharge Home Access:  Stairs to enter Entrance Stairs-Rails: Right, Left Entrance Stairs-Number of Steps: 9-10 Discharge Bathroom Shower/Tub: Tub/shower unit Discharge Bathroom Toilet: Standard Discharge Bathroom Accessibility: Yes How Accessible: Accessible via walker Does the patient have any problems obtaining your medications?: No   Social/Family/Support Systems Anticipated Caregiver: Zimal Weisensel, daughter Anticipated Caregiver's Contact Information: [phone] Caregiver Availability: 24/7 Discharge Plan Discussed with Primary Caregiver: Yes Is Caregiver In Agreement with Plan?: Yes Does Caregiver/Family have Issues with Lodging/Transportation while Pt is in Rehab?: No   Goals Patient/Family Goal for Rehab: Supervision: PT/OT Expected length of stay: 7-10 days Pt/Family Agrees to Admission and willing to participate: Yes Program Orientation Provided & Reviewed with Pt/Caregiver Including Roles  & Responsibilities: Yes   Decrease burden of Care through IP rehab admission: NA   Possible need for SNF placement upon discharge: Not anticipated   Patient Condition: I have reviewed medical records from [facility], spoken with CSW, and patient and daughter. I discussed via phone for inpatient rehabilitation assessment.  Patient will benefit from ongoing PT and OT, can actively participate in 3 hours of therapy a day 5 days of the week, and can  make measurable gains during the admission.  Patient will also benefit from the coordinated team approach during an Inpatient Acute Rehabilitation admission.  The patient will receive intensive therapy as well as Rehabilitation physician, nursing, social worker, and care management interventions.  Due to bladder management, safety, skin/wound care, disease management, medication administration, pain management, and patient education the patient requires 24 hour a day rehabilitation nursing.  The patient is currently Min G-Min A with mobility and Min-Max A with basic ADLs.  Discharge setting and therapy post discharge at home with home health is anticipated.  Patient has agreed to participate in the Acute Inpatient Rehabilitation Program and will admit today.   Preadmission Screen Completed By:  Domingo Pulse, 01/25/2022 10:23 AM ______________________________________________________________________   Discussed status with Dr. Wynn Banker on 01/25/22  at 10:23 AM and received approval for admission today.   Admission Coordinator:  Domingo Pulse, CCC-SLP, time 10:23 AM/Date 01/25/22     Assessment/Plan: Diagnosis:L AKA Does the need for close, 24 hr/day Medical supervision in concert with the patient's rehab needs make it unreasonable for this patient to be served in a less intensive setting? Yes Co-Morbidities requiring supervision/potential complications: DM, HTN, Post op anemia and pain Due to bladder management, bowel management, safety, skin/wound care, disease management, medication administration, pain management, and patient education, does the patient require 24 hr/day rehab nursing? Yes Does the patient require coordinated care of a physician, rehab nurse, PT, OT, and SLP to address physical and functional deficits in the context of the above medical diagnosis(es)? Yes Addressing deficits in the following areas: balance, endurance, locomotion, strength, transferring,  bowel/bladder control, bathing, dressing, feeding, toileting, and psychosocial support Can the patient actively participate in an intensive therapy program of at least 3 hrs of therapy 5 days a week? Yes The potential for patient to make measurable gains while on inpatient rehab is good Anticipated functional outcomes upon discharge from inpatient rehab: supervision PT, supervision OT, n/a SLP Estimated rehab length of stay to reach the above functional goals is: 7-10d Anticipated discharge destination: Home 10. Overall Rehab/Functional Prognosis: good     MD Signature: Erick Colace M.D. [facility] Fellow Am [employer] of [employer] and Rehab Diplomate Am Board of Electrodiagnostic Med Fellow Am Board of Interventional Pain

## 2022-01-25 NOTE — Progress Notes (Signed)
Met with patient to review current situation, rehab process, team conference, and plan of care. Discussed hx of DM (A1C 5.1) and PAD with current use of cigarettes (1/2 pack a day). Pt verbalized an understanding of medications and dietary modification recommendations (carb/mod/thin). Will continue to follow along to discharge to address educational needs to facilitate preparation for discharge.

## 2022-01-25 NOTE — Discharge Summary (Signed)
Physician Discharge Summary  Jocelyn [name] [id number] DOB: July 10, 1981 DOA: 01/17/2022  PCP: [facility] [place] date: 01/17/2022 Discharge date: 01/25/2022  Admitted From: home  Disposition:  CIR  Recommendations for Outpatient Follow-up:  Follow up with PCP in 1-2 weeks F/u w/ vasc surg, Dr. Lucky Cowboy, in 1-2 weeks   Home Health: no  Equipment/Devices:  Discharge Condition: stable  CODE STATUS: full  Diet recommendation: Heart Healthy / Carb Modified  Brief/Interim Summary: HPI was taken from Dr. Damita Dunnings: Jocelyn [name] is a 41 y.o. female with medical history significant of DM, HTN, tobacco use disorder, iron deficiency anemia, peripheral arterial disease on Eliquis with history of left lower extremity stent placement in July [id number] complicated by in-stent thrombosis requiring thrombolytic therapy and more recently on 01/07/2022 undergoing mechanical thrombectomy of the left SFA with angioplasty who presents to the ED with a 2-day history of severe pain of the left leg associated with the appearance of blisters on the leg,.  She also has shortness of breath starting today but without chest pain.  She has been compliant with her Eliquis. ED course and data review: Tachycardic to 1 17-1 22 in the ED with BP 149/86.  Temp 99. Labs WBC 13,000, hemoglobin 7.4 which is her baseline platelets 800.  BMP unremarkable.  Troponin 7 and BNP 59 EKG, personally viewed and interpreted: Sinus tachycardia at 117 with no acute ST-T wave changes Imaging significant for occlusion of the superficial femoral artery on the left among other findings lower extremity Doppler showed occlusive thrombus in the left lower extremity from the popliteal to the common femoral vein   Patient was started on a heparin infusion.  The ED provider spoke with vascular surgeon Dr. Shelia Media who evaluated patient in the ED and will take patient emergently to the OR.  Hospitalist consulted for admission.  As per Dr.  Sheppard Coil:  Jocelyn [name] is a 41 y.o. female with medical history significant of DM, HTN, tobacco use disorder, iron deficiency anemia, peripheral arterial disease on Eliquis with history of left lower extremity stent placement in July [id number] complicated by in-stent thrombosis requiring thrombolytic therapy and more recently on 01/07/2022 undergoing mechanical thrombectomy of the left SFA with angioplasty who presents to the ED with a 2-day history of severe pain of the left leg associated with the appearance of blisters on the leg,.  Reports compliance with Eliquis. 07/09: Imaging in ED occlusion superficial femoral artery on the left, occlusive thrombus LLE from popliteal to common femoral vein.  Started on heparin infusion.  Taken to the OR by vascular surgery.  Admitted 07/10: repeat angio 07/11: Recommending amputation which patient was refusing, may benefit from venous intervention to reduce swelling which she was also refusing 07/12: Patient amenable to amputation and this is planned for today. 07/13: low Hgb, held AM eliquis and transfusing 2 units PRBC and if stable H/H will resume elisuis. Plan PT tomorrow.  07/14: stable, doing well. To MedSurg and off tele.    As per Dr. Jimmye Norman 7/15-7/17/23: PT/OT evaluated pt and recommend CIR. Pt has continued to need pain meds after LLE AKA. Of note, pt stated she was taken off of amlodipine and started on HCTZ by her PCP for HTN. Pt will be d/c to CIR. For more information, please see previous progress/consult notes.    Discharge Diagnoses:  Principal Problem:   Critical limb ischemia of left lower extremity (HCC) Active Problems:   Acute deep vein thrombosis (DVT) of left femoral vein (  HCC)   IDA (iron deficiency anemia)   Diabetes mellitus without complication (HCC)   HTN (hypertension)   Hypomagnesemia   Fever   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HCC)   Acute urinary retention  Critical limb ischemia of left lower extremity: w/ hx of PAD w/ recurrent left leg  arterial occlusion from the SFA origin down. 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 PT/OT recs CIR  PAD: with recurrent left leg arterial occlusion from the SFA origin down. Management as per vasc surg   IDA: w/ component of acute blood loss anemia secondary to recent surg. S/p 3 units of pRBCs transfused. H&H are trending up today    DM2: likely poorly controlled. HbA1c ordered. Continue on SSI w/ accuchecks TID w/ meals.   HTN: amlodipine was d/c by pt's PCP & started on HCTZ as per pt    Acute deep vein thrombosis: of left femoral vein. S/p LLE AKA as per vasc surg. Continue on eliquis     Fever: resolved    Hypomagnesemia: WNL yesterday   Wet gangrene: secondary to acute ischemia due to arterial thrombosis of left leg. S/p L AKA as per vasc surg. Abxs were d/c as per ID     Acute urinary retention: s/p foley remove. Able to urinate independently. Bladder scan prn    Hypokalemia: WNL today   Thrombocytosis: etiology unclear. Will continue to monitor   Discharge Instructions  Discharge Instructions     Diet - low sodium heart healthy   Complete by: As directed    Diet Carb Modified   Complete by: As directed    Discharge instructions   Complete by: As directed    F/u w/ PCP in 1-2 weeks. F/u w/ vascular surg, Dr. Wyn Quaker, in 1-2 weeks.   Increase activity slowly   Complete by: As directed    No wound care   Complete by: As directed    No wound care   Complete by: As directed       Allergies as of 01/25/2022   No Known Allergies      Medication List     STOP taking these medications    amLODipine 5 MG tablet Commonly known as: NORVASC   nicotine 21 mg/24hr patch Commonly known as: NICODERM CQ - dosed in mg/24 hours       TAKE these medications    apixaban 5 MG Tabs tablet Commonly known as: ELIQUIS Take 1 tablet (5 mg total) by mouth 2 (two) times daily.   docusate sodium 100 MG capsule Commonly known as: COLACE Take 1 capsule (100 mg total) by  mouth 2 (two) times daily as needed for mild constipation.   gabapentin 300 MG capsule Commonly known as: NEURONTIN Take 1 capsule (300 mg total) by mouth 3 (three) times daily.   hydrochlorothiazide 25 MG tablet Commonly known as: HYDRODIURIL Take 1 tablet (25 mg total) by mouth daily. Start taking on: January 26, 2022   HYDROcodone-acetaminophen 5-325 MG tablet Commonly known as: NORCO/VICODIN Take 1 tablet by mouth every 4 (four) hours as needed for up to 1 day for moderate pain or severe pain.   insulin aspart 100 UNIT/ML injection Commonly known as: novoLOG Inject 0-15 Units into the skin 3 (three) times daily with meals.   Iron 325 (65 Fe) MG Tabs Take 1 tablet (325 mg total) by mouth daily.   polyethylene glycol 17 g packet Commonly known as: MIRALAX / GLYCOLAX Take 17 g by mouth daily as needed for moderate constipation.  Follow-up Information     Dew, Erskine Squibb, MD Follow up.   Specialties: Vascular Surgery, Radiology, Interventional Cardiology Why: F/u in 1-2 weeks Contact information: [place] [address] [place], [facility] Follow up.   Why: f/u in 1-2 weeks Contact information: [place] [address] [phone]                No Known Allergies  Consultations: Vascular surg  ID   Procedures/Studies: PERIPHERAL VASCULAR CATHETERIZATION  Result Date: 01/18/2022 See surgical note for result.  PERIPHERAL VASCULAR CATHETERIZATION  Result Date: 01/17/2022 See surgical note for result.  US Venous Img Lower Unilateral Left  Result Date: 01/17/2022 CLINICAL DATA:  Left leg pain and swelling EXAM: LEFT LOWER EXTREMITY VENOUS DOPPLER ULTRASOUND TECHNIQUE: Gray-scale sonography with graded compression, as well as color Doppler and duplex ultrasound were performed to evaluate the lower extremity deep venous systems from the level of the common femoral vein and including the  common femoral, femoral, profunda femoral, popliteal and calf veins including the posterior tibial, peroneal and gastrocnemius veins when visible. The superficial great saphenous vein was also interrogated. Spectral Doppler was utilized to evaluate flow at rest and with distal augmentation maneuvers in the common femoral, femoral and popliteal veins. COMPARISON:  None Available. FINDINGS: Contralateral Common Femoral Vein: Respiratory phasicity is normal and symmetric with the symptomatic side. No evidence of thrombus. Normal compressibility. Common Femoral Vein: Occlusive thrombus is noted with decreased compressibility and occlusive nature. Saphenofemoral Junction: Occlusive thrombus is noted with decreased compressibility Profunda Femoral Vein: Occlusive thrombus is noted with decreased compressibility. Femoral Vein: Occlusive thrombus is noted with decreased compressibility. Popliteal Vein: Occlusive thrombus is noted with decreased compressibility. Calf Veins: Not well visualized Superficial Great Saphenous Vein: No evidence of thrombus. Normal compressibility. Venous Reflux:  None. Other Findings:  None. IMPRESSION: Significant occlusive thrombus within the left lower extremity from the popliteal vein to the common femoral vein. These findings would support that seen on recent CT examination. Electronically Signed   By: Inez Catalina M.D.   On: 01/17/2022 02:44   CT Angio Aortobifemoral W and/or Wo Contrast  Result Date: 01/17/2022 CLINICAL DATA:  Lower leg pain and swelling EXAM: CT ANGIOGRAPHY OF ABDOMINAL AORTA WITH ILIOFEMORAL RUNOFF TECHNIQUE: Multidetector CT imaging of the abdomen, pelvis and lower extremities was performed using the standard protocol during bolus administration of intravenous contrast. Multiplanar CT image reconstructions and MIPs were obtained to evaluate the vascular anatomy. RADIATION DOSE REDUCTION: This exam was performed according to the departmental dose-optimization program  which includes automated exposure control, adjustment of the mA and/or kV according to patient size and/or use of iterative reconstruction technique. CONTRAST:  138mL OMNIPAQUE IOHEXOL 350 MG/ML SOLN COMPARISON:  01/05/2022 FINDINGS: VASCULAR Aorta: Normal caliber aorta without aneurysm, dissection, vasculitis or significant stenosis. Celiac: Mild variant anatomy of the celiac axis is noted with the left hepatic artery arising directly from the aorta. The remainder of the celiac axis is within normal limits. SMA: Patent without evidence of aneurysm, dissection, vasculitis or significant stenosis. Renals: Dual renal arteries are noted on the right with single renal artery on the left. No obstructive changes are seen. IMA: Patent without evidence of aneurysm, dissection, vasculitis or significant stenosis. RIGHT Lower Extremity Inflow: Right common and external iliac artery are within normal limits. Common femoral artery is widely patent. Runoff: Superficial femoral artery and popliteal artery are widely patent. Normal popliteal trifurcation is noted  with runoff primarily via a diminutive anterior tibial artery. Posterior tibial artery is not visualized beyond the mid calf. LEFT Lower Extremity Inflow: Mild stenosis is noted at the origin of the common iliac artery. The thrombus at extends into the lumen on the prior exam is smaller in size but persistent. This may represent a site for potential distal emboli. The external iliac artery is patent. Common femoral artery is patent as well. Runoff: Profundus femoral artery is patent. The superficial femoral artery is occluded at its origin. Additionally there is significant stenting in the distal superficial femoral artery extending into the popliteal artery. This stent is occluded throughout its course. Muscular collaterals reconstitute the anterior tibial artery in the proximal calf. The posterior tibial artery is not visualized. Veins: Mild decreased attenuation is  noted in the left iliac venous system. The vein on the left is somewhat larger than that seen on the right. This raises suspicion for deep venous thrombosis given the significant peripheral edema and swelling. Review of the MIP images confirms the above findings. NON-VASCULAR Lower chest: Lung bases are free of acute infiltrate or sizable effusion. Hepatobiliary: No focal liver abnormality is seen. No gallstones, gallbladder wall thickening, or biliary dilatation. Pancreas: Unremarkable. No pancreatic ductal dilatation or surrounding inflammatory changes. Spleen: Normal in size without focal abnormality. Adrenals/Urinary Tract: Adrenal glands are within normal limits. Kidneys demonstrate a normal enhancement pattern bilaterally. Some scarring is noted in the lower pole of the right kidney. Stomach/Bowel: No obstructive or inflammatory changes of the colon are seen. Mild diverticular changes noted without diverticulitis. Small bowel and stomach appear within normal limits. Lymphatic: No lymphadenopathy is noted. Reproductive: Uterus and bilateral adnexa are unremarkable. Other: No abdominal wall hernia or abnormality. No abdominopelvic ascites. Musculoskeletal: There is significant edema involving the left lower extremity and extending into the left buttock some reactive lymphadenopathy is noted in the medial aspect of the left thigh. The left thigh is considerably larger than that the right thigh. These inflammatory changes extend into the calf and to the level of the ankle increased from the prior exam. IMPRESSION: VASCULAR Left lower extremity: There remains thrombus extending into the lumen of the common iliac artery although the burden is less than that seen on the prior exam. There is now occlusion of the superficial femoral artery on the left at its origin which is progressive when compared with the prior exam. The profundus femoral artery is noted with multiple muscular collaterals reconstituting the anterior  tibial artery in the calf. This continues to the level of the ankle. Right lower extremity: Patent superficial femoral and popliteal artery. The popliteal trifurcation is patent as well. The dominant runoff vessel is the diminutive anterior tibial artery. Posterior tibial artery is visualized to the mid calf. No other arterial abnormality is noted. Some mild decreased attenuation in the venous structures of the left lower extremity and left iliac veins is noted. Duplex examination is recommended to evaluate for venous thrombosis. NON-VASCULAR Considerable soft tissue swelling is noted involving the left leg from the level of the buttock to the foot. The leg is considerably increased in size. No muscular necrosis is noted. Mild diverticular change without diverticulitis. No other focal abnormality is noted. Electronically Signed   By: Alcide Clever M.D.   On: 01/17/2022 02:23   DG Chest Portable 1 View  Result Date: 01/17/2022 CLINICAL DATA:  Leg pain swelling short of breath EXAM: PORTABLE CHEST 1 VIEW COMPARISON:  01/05/2022 FINDINGS: The heart size and mediastinal contours are  within normal limits. Both lungs are clear. The visualized skeletal structures are unremarkable. IMPRESSION: No active disease. Electronically Signed   By: Donavan Foil M.D.   On: 01/17/2022 01:51   PERIPHERAL VASCULAR CATHETERIZATION  Result Date: 01/07/2022 See surgical note for result.  PERIPHERAL VASCULAR CATHETERIZATION  Result Date: 01/06/2022 See surgical note for result.  DG Chest Portable 1 View  Result Date: 01/05/2022 CLINICAL DATA:  Tachycardia. EXAM: PORTABLE CHEST 1 VIEW COMPARISON:  03/07/2020, lung bases from abdominal CT earlier today. FINDINGS: The cardiomediastinal contours are normal. The lungs are clear. Pulmonary vasculature is normal. No consolidation, pleural effusion, or pneumothorax. No acute osseous abnormalities are seen. IMPRESSION: No acute chest findings. Electronically Signed   By: Keith Rake M.D.   On: 01/05/2022 23:48   CT Angio Aortobifemoral W and/or Wo Contrast  Result Date: 01/05/2022 CLINICAL DATA:  Arterial embolism, lower extremity. LEFT LOWER LEG PAIN EXAM: CT ANGIOGRAPHY OF ABDOMINAL AORTA WITH ILIOFEMORAL RUNOFF TECHNIQUE: Multidetector CT imaging of the abdomen, pelvis and lower extremities was performed using the standard protocol during bolus administration of intravenous contrast. Multiplanar CT image reconstructions and MIPs were obtained to evaluate the vascular anatomy. RADIATION DOSE REDUCTION: This exam was performed according to the departmental dose-optimization program which includes automated exposure control, adjustment of the mA and/or kV according to patient size and/or use of iterative reconstruction technique. CONTRAST:  165mL OMNIPAQUE IOHEXOL 350 MG/ML SOLN COMPARISON:  None Available. FINDINGS: VASCULAR Aorta: No aneurysm or dissection. Infrarenal atherosclerotic calcifications. Celiac: Patent without evidence of aneurysm, dissection, vasculitis or significant stenosis. SMA: Patent without evidence of aneurysm, dissection, vasculitis or significant stenosis. Renals: Both renal arteries are patent without evidence of aneurysm, dissection, vasculitis, fibromuscular dysplasia or significant stenosis. IMA: Patent without evidence of aneurysm, dissection, vasculitis or significant stenosis. RIGHT Lower Extremity Inflow: Common, internal and external iliac arteries are patent without evidence of aneurysm, dissection, vasculitis or significant stenosis. Outflow: Common, superficial and profunda femoral arteries and the popliteal artery are patent without evidence of aneurysm, dissection, vasculitis or significant stenosis. Runoff: Proximal trifurcation vessels are patent in the right calf. The vessels are not visualized beyond the mid calf. It is unclear if this is related to occlusion or bolus timing. LEFT Lower Extremity Inflow: Irregular thrombus extends into the  lumen of the left common iliac artery. Left external iliac artery, internal iliac artery widely patent. Outflow: Common, superficial and profundus femoral arteries are patent. There is a stent noted in the distal superficial femoral artery and popliteal artery. No contrast is seen within the stent or below the stent. Runoff: Non opacification of the trifurcation vessels in the left calf. Veins: No obvious venous abnormality within the limitations of this arterial phase study. Review of the MIP images confirms the above findings. NON-VASCULAR Lower chest: No acute findings Hepatobiliary: No focal hepatic abnormality. Gallbladder unremarkable. Pancreas: No focal abnormality or ductal dilatation. Spleen: No focal abnormality.  Normal size. Adrenals/Urinary Tract: Areas of cortical thinning in the kidneys bilaterally. No renal or adrenal mass. No hydronephrosis. Urinary bladder unremarkable. Stomach/Bowel: Stomach, large and small bowel grossly unremarkable. Lymphatic: No adenopathy Reproductive: Uterus and adnexa unremarkable.  No mass. Other: No free fluid or free air. Musculoskeletal: No acute bony abnormality. IMPRESSION: VASCULAR Aortic atherosclerosis.  No aneurysm or dissection. Irregular plaque in the left common iliac artery extends into the lumen and could be a source of distal emboli. Occlusion of the distal left superficial femoral artery at the level of the stent which extends into the  left popliteal artery. No flow seen within the stent. No flow seen within the trifurcation vessels in the left calf. Widely patent outflow and runoff in the right lower extremity to the level of the mid calf. Non visualization of the vessels below the mid calf. This is unclear if this is due to occlusion or bolus timing. NON-VASCULAR No acute findings in the abdomen or pelvis. Electronically Signed   By: Rolm Baptise M.D.   On: 01/05/2022 23:20   (Echo, Carotid, EGD, Colonoscopy, ERCP)    Subjective: Pt c/o fatigue     Discharge Exam: Vitals:   01/25/22 0525 01/25/22 0800  BP: (!) 144/82 (!) 145/85  Pulse: 90 91  Resp: 16 17  Temp: 98.5 F (36.9 C) 98.9 F (37.2 C)  SpO2: 96% 98%   Vitals:   01/24/22 0800 01/24/22 1943 01/25/22 0525 01/25/22 0800  BP: (!) 158/90 (!) 144/88 (!) 144/82 (!) 145/85  Pulse: 96 94 90 91  Resp: 18 20 16 17   Temp: 98 F (36.7 C) 97.9 F (36.6 C) 98.5 F (36.9 C) 98.9 F (37.2 C)  TempSrc: Oral Oral Oral Oral  SpO2: 98% 97% 96% 98%  Weight:      Height:        General: Pt is alert, awake, not in acute distress Cardiovascular: S1/S2 +, no rubs, no gallops Respiratory: CTA bilaterally, no wheezing, no rhonchi Abdominal: Soft, NT, ND, bowel sounds + Extremities: LLE AKA, no cyanosis    The results of significant diagnostics from this hospitalization (including imaging, microbiology, ancillary and laboratory) are listed below for reference.     Microbiology: Recent Results (from the past 240 hour(s))  Culture, blood (Routine X 2) w Reflex to ID Panel     Status: None   Collection Time: 01/19/22 11:28 AM   Specimen: BLOOD RIGHT FOREARM  Result Value Ref Range Status   Specimen Description BLOOD RIGHT FOREARM  Final   Special Requests   Final    BOTTLES DRAWN AEROBIC AND ANAEROBIC Blood Culture adequate volume   Culture   Final    NO GROWTH 5 DAYS Performed at [facility], [address] [place], [place] [address]    Report Status 01/24/2022 FINAL  Final  Culture, blood (Routine X 2) w Reflex to ID Panel     Status: None   Collection Time: 01/19/22 11:32 AM   Specimen: BLOOD RIGHT HAND  Result Value Ref Range Status   Specimen Description BLOOD RIGHT HAND  Final   Special Requests   Final    BOTTLES DRAWN AEROBIC AND ANAEROBIC Blood Culture adequate volume   Culture   Final    NO GROWTH 5 DAYS Performed at [facility], [address] [place], [place] [address]    Report Status 01/24/2022 FINAL  Final  Aerobic Culture w  Gram Stain (superficial specimen)     Status: None   Collection Time: 01/19/22  6:53 PM   Specimen: Wound  Result Value Ref Range Status   Specimen Description   Final    WOUND Performed at [facility], [address], [place], [place] [address]    Special Requests   Final    LL Performed at [facility], [place], [place] [address]    Gram Stain NO WBC SEEN NO ORGANISMS SEEN   Final   Culture   Final    NO GROWTH 2 DAYS Performed at [facility], [place] [address], [place], [place]  [id number]    Report Status 01/22/2022 FINAL  Final     Labs: BNP (last 3 results) Recent Labs    01/17/22 0116  BNP [id number]    Basic Metabolic Panel: Recent Labs  Lab 01/19/22 0132 01/20/22 0500 01/21/22 0443 01/22/22 0515 01/23/22 0608 01/24/22 0502 01/25/22 0537  NA 137 140 139 143 141 143 144  K 3.6 3.3* 4.1 3.6 3.0* 3.5 3.7  CL 106 111 111 111 110 113* 112*  CO2 24 25 23 23 25 26 26   GLUCOSE 103* 105* 138* 114* 95 102* 101*  BUN 7 8 9 7 8 8 9   CREATININE 0.59 0.66 0.61 0.69 0.49 0.49 0.44  CALCIUM 8.2* 8.4* 8.1* 8.5* 8.6* 8.7* 9.1  MG 1.5* 1.9 1.6* 1.7  --   --   --     Liver Function Tests: No results for input(s): "AST", "ALT", "ALKPHOS", "BILITOT", "PROT", "ALBUMIN" in the last 168 hours. No results for input(s): "LIPASE", "AMYLASE" in the last 168 hours. No results for input(s): "AMMONIA" in the last 168 hours. CBC: Recent Labs  Lab 01/21/22 0443 01/21/22 0740 01/21/22 2145 01/22/22 0515 01/23/22 0608 01/24/22 0502 01/25/22 0537  WBC 12.9*  --   --  12.0* 8.3 6.2 4.8  HGB 6.5*   < > 8.8* 9.3* 9.2* 9.1* 9.3*  HCT 22.6*   < > 28.9* 30.5* 30.4* 30.0* 31.7*  MCV 76.6*  --   --  77.2* 78.8* 79.2* 80.3  PLT 336  --   --  376 389 414* 474*   < > = values in this interval not displayed.    Cardiac Enzymes: Recent Labs  Lab 01/19/22 1853  CKTOTAL 181    BNP: Invalid input(s): "POCBNP" CBG: Recent Labs  Lab 01/24/22 1157  01/24/22 1645 01/24/22 2049 01/25/22 0805 01/25/22 1156  GLUCAP 130* 107* 123* 115* 106*    D-Dimer No results for input(s): "DDIMER" in the last 72 hours. Hgb A1c No results for input(s): "HGBA1C" in the last 72 hours. Lipid Profile No results for input(s): "CHOL", "HDL", "LDLCALC", "TRIG", "CHOLHDL", "LDLDIRECT" in the last 72 hours. Thyroid function studies No results for input(s): "TSH", "T4TOTAL", "T3FREE", "THYROIDAB" in the last 72 hours.  Invalid input(s): "FREET3" Anemia work up No results for input(s): "VITAMINB12", "FOLATE", "FERRITIN", "TIBC", "IRON", "RETICCTPCT" in the last 72 hours. Urinalysis    Component Value Date/Time   COLORURINE AMBER (A) 01/05/2022 2354   APPEARANCEUR CLOUDY (A) 01/05/2022 2354   APPEARANCEUR Hazy 04/24/2013 1419   LABSPEC >1.046 (H) 01/05/2022 2354   LABSPEC 1.018 04/24/2013 1419   PHURINE 5.0 01/05/2022 2354   GLUCOSEU NEGATIVE 01/05/2022 2354   GLUCOSEU Negative 04/24/2013 1419   HGBUR LARGE (A) 01/05/2022 2354   BILIRUBINUR NEGATIVE 01/05/2022 2354   BILIRUBINUR Negative 04/24/2013 1419   KETONESUR 5 (A) 01/05/2022 2354   PROTEINUR 100 (A) 01/05/2022 2354   NITRITE NEGATIVE 01/05/2022 2354   LEUKOCYTESUR NEGATIVE 01/05/2022 2354   LEUKOCYTESUR Negative 04/24/2013 1419   Sepsis Labs Recent Labs  Lab 01/22/22 0515 01/23/22 0608 01/24/22 0502 01/25/22 0537  WBC 12.0* 8.3 6.2 4.8    Microbiology Recent Results (from the past 240 hour(s))  Culture, blood (Routine X 2) w Reflex to ID Panel     Status: None   Collection Time: 01/19/22 11:28 AM   Specimen: BLOOD RIGHT FOREARM  Result Value Ref Range Status   Specimen Description BLOOD RIGHT FOREARM  Final   Special Requests   Final    BOTTLES DRAWN AEROBIC  AND ANAEROBIC Blood Culture adequate volume   Culture   Final    NO GROWTH 5 DAYS Performed at [facility], [address] [place], [place] [address]    Report Status 01/24/2022 FINAL  Final  Culture,  blood (Routine X 2) w Reflex to ID Panel     Status: None   Collection Time: 01/19/22 11:32 AM   Specimen: BLOOD RIGHT HAND  Result Value Ref Range Status   Specimen Description BLOOD RIGHT HAND  Final   Special Requests   Final    BOTTLES DRAWN AEROBIC AND ANAEROBIC Blood Culture adequate volume   Culture   Final    NO GROWTH 5 DAYS Performed at [facility], [address], [place], [place] [address]    Report Status 01/24/2022 FINAL  Final  Aerobic Culture w Gram Stain (superficial specimen)     Status: None   Collection Time: 01/19/22  6:53 PM   Specimen: Wound  Result Value Ref Range Status   Specimen Description   Final    WOUND Performed at [facility], [address], [place], [place] [address]    Special Requests   Final    LL Performed at [facility], [address] [place], [place] [address]    Gram Stain NO WBC SEEN NO ORGANISMS SEEN   Final   Culture   Final    NO GROWTH 2 DAYS Performed at [facility], [address], [place], [place] [address]    Report Status 01/22/2022 FINAL  Final     Time coordinating discharge: Over 30 minutes  SIGNED:   Charise Killian, MD  [facility] 01/25/2022, 12:22 PM Pager   If 7PM-7AM, please contact night-coverage [email or web address]

## 2022-01-25 NOTE — Progress Notes (Signed)
Signed      PMR Admission Coordinator Pre-Admission Assessment   Patient: Jocelyn [name] is an 41 y.o., female MRN: [id number] DOB: [birth year] Height: 5\' 5"  (165.1 cm) Weight: 96.3 kg   Insurance Information HMO:     PPO:      PCP:      IPA:      80/20:      OTHER:  PRIMARY: UNINSURED   Gave pt self-pay estimate on 7/17 and reviewed on the telephone  Policy#:       Subscriber:  CM Name:       Phone#:      Fax#:  Pre-Cert#:       Employer:  Benefits:  Phone #:      Name:  Eff. Date:      Deduct:       Out of Pocket Max:       Life Max:  CIR:       SNF:  Outpatient:      Co-Pay:  Home Health:       Co-Pay:  DME:      Co-Pay:  Providers:  SECONDARY:       Policy#:      Phone#:    Financial Counselor: 8/17      Phone#:    The "Data Collection Information Summary" for patients in Inpatient Rehabilitation Facilities with attached "Privacy Act Statement-Health Care Records" was provided and verbally reviewed with: N/A   Emergency Contact Information Contact Information       Name Relation Home Work Mobile    [name] Mother     [phone]    Christelle, [place] Daughter     [phone]           Current Medical History  Patient Admitting Diagnosis: left ischemia limb, s/p L AKA History of Present Illness: Pt is a 41 year old female with medical hx significant for: PAD, s/p LLE angiogram with stent placement (01/11/20), s/p thrombolytic therapy and angiogram (03/07/20), mechanical thrombectomy, angioplasty (01/07/22), DM, HTN, iron deficiency anemia, tobacco use. Pt [facility] to [facility] on 01/17/22 d/t severe left lower leg pain and swelling.Pt also reported blisters on left leg and SOB with exertion. Pt recently hospitalized 01/05/22-01/09/22 d/t left lower extremity ischemia. Venous doppler showed significant occlusive thrombus from popliteal vein to the common femoral vein. CTA revealed acute arterial occlusion of the left SFA. Pt had LLE angiogram with  angioplasty left SFA, posterior tibial artery, attempted penumbra thrombectomy left SFA, and placement of lytic infusion catheter in the left posterior tibial artery on 01/17/22. Pt Hgb 7, transfused 1 unit of PRBC on 7/9.  Pt had LLE angiogram and mechanical thrombectomy on 01/18/22. 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 Pt Hgb 6.3, transfused 2 units PRBC on 7/13. Therapy evaluations completed and CIR recommended d/t pt's deficits in mobility and inability to complete ADLs independently.    Patient's medical record from [facility] has been reviewed by the rehabilitation admission coordinator and physician.   Past Medical History         Past Medical History:  Diagnosis Date   IDA (iron deficiency anemia) 12/17/2019      Has the patient had major surgery during 100 days prior to admission? Yes   Family History   family history includes Hypertension in her brother, mother, and sister.   Current Medications   Current Facility-Administered Medications:    0.9 %  sodium chloride infusion, , Intravenous, PRN, 02/16/2020, Wyn Quaker, MD, Stopped  at 01/17/22 1835   0.9 %  sodium chloride infusion, 10 mL/hr, Intravenous, Once, Dew, Marlow Baars, MD   acetaminophen (TYLENOL) tablet 650 mg, 650 mg, Oral, Q6H PRN, 650 mg at 01/19/22 0756 **OR** acetaminophen (TYLENOL) suppository 650 mg, 650 mg, Rectal, Q6H PRN, Wyn Quaker, Marlow Baars, MD   apixaban (ELIQUIS) tablet 5 mg, 5 mg, Oral, BID, Beers, Sherlynn Carbon, RPH, 5 mg at 01/25/22 0957   Chlorhexidine Gluconate Cloth 2 % PADS 6 each, 6 each, Topical, Q0600, Annice Needy, MD, 6 each at 01/24/22 1000   diphenhydrAMINE (BENADRYL) injection 12.5 mg, 12.5 mg, Intravenous, Q6H PRN, 12.5 mg at 01/22/22 0454 **OR** diphenhydrAMINE (BENADRYL) 12.5 MG/5ML elixir 12.5 mg, 12.5 mg, Oral, Q6H PRN, Wyn Quaker, Marlow Baars, MD   ferrous sulfate tablet 325 mg, 325 mg, Oral, Q breakfast, Charise Killian, MD, 325 mg at 01/25/22 0710   gabapentin (NEURONTIN) capsule 300 mg, 300 mg, Oral, TID,  Charise Killian, MD, 300 mg at 01/25/22 0957   hydrochlorothiazide (HYDRODIURIL) tablet 25 mg, 25 mg, Oral, Daily, Charise Killian, MD, 25 mg at 01/25/22 0957   HYDROcodone-acetaminophen (NORCO/VICODIN) 5-325 MG per tablet 1-2 tablet, 1-2 tablet, Oral, Q4H PRN, Annice Needy, MD, 2 tablet at 01/25/22 0710   insulin aspart (novoLOG) injection 0-15 Units, 0-15 Units, Subcutaneous, TID WC, Annice Needy, MD, 2 Units at 01/24/22 1305   insulin aspart (novoLOG) injection 0-5 Units, 0-5 Units, Subcutaneous, QHS, Dew, Marlow Baars, MD, 2 Units at 01/20/22 2203   morphine (PF) 2 MG/ML injection 2 mg, 2 mg, Intravenous, Q2H PRN, Sunnie Nielsen, DO, 2 mg at 01/24/22 2101   naloxone (NARCAN) injection 0.4 mg, 0.4 mg, Intravenous, PRN **AND** sodium chloride flush (NS) 0.9 % injection 9 mL, 9 mL, Intravenous, PRN, Dew, Marlow Baars, MD   nicotine (NICODERM CQ - dosed in mg/24 hours) patch 21 mg, 21 mg, Transdermal, Daily, Alexander, Natalie, DO   ondansetron (ZOFRAN) tablet 4 mg, 4 mg, Oral, Q6H PRN **OR** ondansetron (ZOFRAN) injection 4 mg, 4 mg, Intravenous, Q6H PRN, Dew, Marlow Baars, MD   Oral care mouth rinse, 15 mL, Mouth Rinse, PRN, Wyn Quaker, Marlow Baars, MD   Patients Current Diet:  Diet Order                  Diet regular Room service appropriate? Yes; Fluid consistency: Thin  Diet effective now                         Precautions / Restrictions Precautions Precautions: Fall Restrictions Weight Bearing Restrictions: Yes LLE Weight Bearing: Non weight bearing    Has the patient had 2 or more falls or a fall with injury in the past year? No   Prior Activity Level Community (5-7x/wk): drives, works, gets out of house daily   Prior Functional Level Self Care: Did the patient need help bathing, dressing, using the toilet or eating? Independent   Indoor Mobility: Did the patient need assistance with walking from room to room (with or without device)? Independent   Stairs: Did the patient need  assistance with internal or external stairs (with or without device)? Independent   Functional Cognition: Did the patient need help planning regular tasks such as shopping or remembering to take medications? Independent   Patient Information Are you of Hispanic, Latino/a,or Spanish origin?: A. No, not of Hispanic, Latino/a, or Spanish origin What is your race?: B. Black or African American Do you need or want an interpreter  to communicate with a doctor or health care staff?: 0. No   Patient's Response To:  Health Literacy and Transportation Is the patient able to respond to health literacy and transportation needs?: Yes Health Literacy - How often do you need to have someone help you when you read instructions, pamphlets, or other written material from your doctor or pharmacy?: Never In the past 12 months, has lack of transportation kept you from medical appointments or from getting medications?: No In the past 12 months, has lack of transportation kept you from meetings, work, or from getting things needed for daily living?: No   Journalist, newspaperHome Assistive Devices / Equipment Home Assistive Devices/Equipment: None Home Equipment: Agricultural consultantolling Walker (2 wheels), BSC/3in1   Prior Device Use: Indicate devices/aids used by the patient prior to current illness, exacerbation or injury? Walker   Current Functional Level Cognition   Overall Cognitive Status: Within Functional Limits for tasks assessed Orientation Level: Oriented X4 General Comments: pt much more present during treatment session today.    Extremity Assessment (includes Sensation/Coordination)   Upper Extremity Assessment: Overall WFL for tasks assessed  Lower Extremity Assessment: LLE deficits/detail RLE Deficits / Details: AROM is grossly WFL. endurance likely impaired for sustained activity LLE Deficits / Details: s/p AKA LLE: Unable to fully assess due to pain LLE Sensation:  (absent light touch on the bottom of the foot. decreased  light touch distally> proximally)     ADLs   Overall ADL's : Needs assistance/impaired Eating/Feeding: Set up, Sitting Grooming: Wash/dry hands, Wash/dry face, Sitting, Set up Upper Body Dressing : Minimal assistance, Sitting Lower Body Dressing: Maximal assistance Toilet Transfer: Minimal assistance, Rolling walker (2 wheels), BSC/3in1 Toilet Transfer Details (indicate cue type and reason): SPT to bsc with step by step vcs Toileting- Clothing Manipulation and Hygiene: Minimal assistance, Sit to/from stand, Sitting/lateral lean     Mobility   Overal bed mobility: Needs Assistance Bed Mobility: Supine to Sit Rolling: Min assist Supine to sit: Min guard, HOB elevated Sit to supine: Min assist General bed mobility comments: pt upright on BSC as part of OT hand-off.  Pt returned to the recliner.     Transfers   Overall transfer level: Needs assistance Equipment used: Rolling walker (2 wheels) Transfers: Sit to/from Stand Sit to Stand: Min assist General transfer comment: step by step vcs for approrpaite RW use     Ambulation / Gait / Stairs / Wheelchair Mobility   Ambulation/Gait Ambulation/Gait assistance: LandMin guard Gait Distance (Feet): 2 Feet Assistive device: Rolling walker (2 wheels) Gait Pattern/deviations: Step-to pattern, Decreased step length - right, Decreased step length - left, Decreased stance time - left, Antalgic     Posture / Balance Balance Overall balance assessment: Needs assistance Sitting-balance support: Bilateral upper extremity supported Sitting balance-Leahy Scale: Fair Standing balance support: Bilateral upper extremity supported, During functional activity Standing balance-Leahy Scale: Poor     Special needs/care consideration Skin Surgical incision: thigh/left, External urinary catheter and Diabetic management novoLOG 0-5 units daily at bedtime; novoLOG 0-15 units 3x daily at meals    Previous Home Environment (from acute therapy  documentation) Living Arrangements: Children Available Help at Discharge: Family, Available 24 hours/day Type of Home: Apartment Home Layout: One level Home Access: Stairs to enter Entrance Stairs-Rails: Right, Left Entrance Stairs-Number of Steps: 9-10 Bathroom Shower/Tub: Engineer, manufacturing systemsTub/shower unit Bathroom Toilet: Standard Bathroom Accessibility: Yes How Accessible: Accessible via walker Home Care Services: No   Discharge Living Setting Plans for Discharge Living Setting: Patient's home Type of Home at Discharge:  Apartment Discharge Home Layout: One level Discharge Home Access: Stairs to enter Entrance Stairs-Rails: Right, Left Entrance Stairs-Number of Steps: 9-10 Discharge Bathroom Shower/Tub: Tub/shower unit Discharge Bathroom Toilet: Standard Discharge Bathroom Accessibility: Yes How Accessible: Accessible via walker Does the patient have any problems obtaining your medications?: No   Social/Family/Support Systems Anticipated Caregiver: Armella Stogner, daughter Anticipated Caregiver's Contact Information: [phone] Caregiver Availability: 24/7 Discharge Plan Discussed with Primary Caregiver: Yes Is Caregiver In Agreement with Plan?: Yes Does Caregiver/Family have Issues with Lodging/Transportation while Pt is in Rehab?: No   Goals Patient/Family Goal for Rehab: Supervision: PT/OT Expected length of stay: 7-10 days Pt/Family Agrees to Admission and willing to participate: Yes Program Orientation Provided & Reviewed with Pt/Caregiver Including Roles  & Responsibilities: Yes   Decrease burden of Care through IP rehab admission: NA   Possible need for SNF placement upon discharge: Not anticipated   Patient Condition: I have reviewed medical records from [facility], spoken with CSW, and patient and daughter. I discussed via phone for inpatient rehabilitation assessment.  Patient will benefit from ongoing PT and OT, can actively participate in 3 hours of  therapy a day 5 days of the week, and can make measurable gains during the admission.  Patient will also benefit from the coordinated team approach during an Inpatient Acute Rehabilitation admission.  The patient will receive intensive therapy as well as Rehabilitation physician, nursing, social worker, and care management interventions.  Due to bladder management, safety, skin/wound care, disease management, medication administration, pain management, and patient education the patient requires 24 hour a day rehabilitation nursing.  The patient is currently Min G-Min A with mobility and Min-Max A with basic ADLs.  Discharge setting and therapy post discharge at home with home health is anticipated.  Patient has agreed to participate in the Acute Inpatient Rehabilitation Program and will admit today.   Preadmission Screen Completed By:  Domingo Pulse, 01/25/2022 10:23 AM ______________________________________________________________________   Discussed status with Dr. Wynn Banker on 01/25/22  at 10:23 AM and received approval for admission today.   Admission Coordinator:  Domingo Pulse, CCC-SLP, time 10:23 AM/Date 01/25/22     Assessment/Plan: Diagnosis:L AKA Does the need for close, 24 hr/day Medical supervision in concert with the patient's rehab needs make it unreasonable for this patient to be served in a less intensive setting? Yes Co-Morbidities requiring supervision/potential complications: DM, HTN, Post op anemia and pain Due to bladder management, bowel management, safety, skin/wound care, disease management, medication administration, pain management, and patient education, does the patient require 24 hr/day rehab nursing? Yes Does the patient require coordinated care of a physician, rehab nurse, PT, OT, and SLP to address physical and functional deficits in the context of the above medical diagnosis(es)? Yes Addressing deficits in the following areas: balance, endurance,  locomotion, strength, transferring, bowel/bladder control, bathing, dressing, feeding, toileting, and psychosocial support Can the patient actively participate in an intensive therapy program of at least 3 hrs of therapy 5 days a week? Yes The potential for patient to make measurable gains while on inpatient rehab is good Anticipated functional outcomes upon discharge from inpatient rehab: supervision PT, supervision OT, n/a SLP Estimated rehab length of stay to reach the above functional goals is: 7-10d Anticipated discharge destination: Home 10. Overall Rehab/Functional Prognosis: good     MD Signature: Erick Colace M.D. [facility] Fellow Am Acad of Phys Med and Rehab Diplomate Am Board of Electrodiagnostic Med Fellow Am Board of Interventional  Pain             Note Details  Author Erick Colace, MD File Time 01/25/2022  1:29 PM  Author Type Physician Status Signed  Last Editor Erick Colace, MD Service Physical Medicine and Rehabilitation  Hospital Acct # [id number] Admit Date 01/25/2022

## 2022-01-25 NOTE — Plan of Care (Signed)
VSS, patient will be discharged to [facility] Inpatient Rehab

## 2022-01-25 NOTE — Progress Notes (Deleted)
Physician Discharge Summary  Jocelyn [name] [id number] DOB: 1981/06/16 DOA: 01/17/2022  [facility] [place] date: 01/17/2022 Discharge date: 01/25/2022  Admitted From: home  Disposition:  CIR  Recommendations for Outpatient Follow-up:  Follow up with PCP in 1-2 weeks F/u w/ vasc surg, Dr. Lucky Cowboy, in 1-2 weeks   Home Health: no  Equipment/Devices:  Discharge Condition: stable  CODE STATUS: full  Diet recommendation: Heart Healthy / Carb Modified  Brief/Interim Summary: HPI was taken from Dr. Damita Dunnings: Jocelyn [name] is a 41 y.o. female with medical history significant of DM, HTN, tobacco use disorder, iron deficiency anemia, peripheral arterial disease on Eliquis with history of left lower extremity stent placement in July [id number] complicated by in-stent thrombosis requiring thrombolytic therapy and more recently on 01/07/2022 undergoing mechanical thrombectomy of the left SFA with angioplasty who presents to the ED with a 2-day history of severe pain of the left leg associated with the appearance of blisters on the leg,.  She also has shortness of breath starting today but without chest pain.  She has been compliant with her Eliquis. ED course and data review: Tachycardic to 1 17-1 22 in the ED with BP 149/86.  Temp 99. Labs WBC 13,000, hemoglobin 7.4 which is her baseline platelets 800.  BMP unremarkable.  Troponin 7 and BNP 59 EKG, personally viewed and interpreted: Sinus tachycardia at 117 with no acute ST-T wave changes Imaging significant for occlusion of the superficial femoral artery on the left among other findings lower extremity Doppler showed occlusive thrombus in the left lower extremity from the popliteal to the common femoral vein   Patient was started on a heparin infusion.  The ED provider spoke with vascular surgeon Dr. Shelia Media who evaluated patient in the ED and will take patient emergently to the OR.  Hospitalist consulted for admission.  Jocelyn per Dr.  Sheppard [name]:  Jocelyn [name] is a 41 y.o. female with medical history significant of DM, HTN, tobacco use disorder, iron deficiency anemia, peripheral arterial disease on Eliquis with history of left lower extremity stent placement in July [id number] complicated by in-stent thrombosis requiring thrombolytic therapy and more recently on 01/07/2022 undergoing mechanical thrombectomy of the left SFA with angioplasty who presents to the ED with a 2-day history of severe pain of the left leg associated with the appearance of blisters on the leg,.  Reports compliance with Eliquis. 07/09: Imaging in ED occlusion superficial femoral artery on the left, occlusive thrombus LLE from popliteal to common femoral vein.  Started on heparin infusion.  Taken to the OR by vascular surgery.  Admitted 07/10: repeat angio 07/11: Recommending amputation which patient was refusing, may benefit from venous intervention to reduce swelling which she was also refusing 07/12: Patient amenable to amputation and this is planned for today. 07/13: low Hgb, held AM eliquis and transfusing 2 units PRBC and if stable H/H will resume elisuis. Plan PT tomorrow.  07/14: stable, doing well. To MedSurg and off tele.    Jocelyn per Dr. Jimmye Norman 7/15-7/17/23: PT/OT evaluated pt and recommend CIR. Pt has continued to need pain meds after LLE AKA. Of note, pt stated she was taken off of amlodipine and started on HCTZ by her PCP for HTN. Pt will be d/c to CIR. For more information, please see previous progress/consult notes.    Discharge Diagnoses:  Principal Problem:   Critical limb ischemia of left lower extremity (HCC) Active Problems:   Acute deep vein thrombosis (DVT) of left femoral vein (  HCC)   IDA (iron deficiency anemia)   Diabetes mellitus without complication (HCC)   HTN (hypertension)   Hypomagnesemia   Fever   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HCC)   Acute urinary retention  Critical limb ischemia of left lower extremity: w/ hx of PAD w/ recurrent left leg  arterial occlusion from the SFA origin down. S/p LLE AKA Jocelyn per vasc surg on 01/20/22. PT/OT recs CIR  PAD: with recurrent left leg arterial occlusion from the SFA origin down. Management Jocelyn per vasc surg   IDA: w/ component of acute blood loss anemia secondary to recent surg. S/p 3 units of pRBCs transfused. H&H are trending up today    DM2: likely poorly controlled. HbA1c ordered. Continue on SSI w/ accuchecks TID w/ meals.   HTN: amlodipine was d/c by pt's PCP & started on HCTZ Jocelyn per pt    Acute deep vein thrombosis: of left femoral vein. S/p LLE AKA Jocelyn per vasc surg. Continue on eliquis     Fever: resolved    Hypomagnesemia: WNL yesterday   Wet gangrene: secondary to acute ischemia due to arterial thrombosis of left leg. S/p L AKA Jocelyn per vasc surg. Abxs were d/c Jocelyn per ID     Acute urinary retention: s/p foley remove. Able to urinate independently. Bladder scan prn    Hypokalemia: WNL today   Thrombocytosis: etiology unclear. Will continue to monitor   Discharge Instructions  Discharge Instructions     Diet - low sodium heart healthy   Complete by: Jocelyn directed    Diet Carb Modified   Complete by: Jocelyn directed    Discharge instructions   Complete by: Jocelyn directed    F/u w/ PCP in 1-2 weeks. F/u w/ vascular surg, Dr. Wyn Quaker, in 1-2 weeks.   Increase activity slowly   Complete by: Jocelyn directed    No wound care   Complete by: Jocelyn directed    No wound care   Complete by: Jocelyn directed       Allergies Jocelyn of 01/25/2022   No Known Allergies      Medication List     STOP taking these medications    amLODipine 5 MG tablet Commonly known Jocelyn: NORVASC   nicotine 21 mg/24hr patch Commonly known Jocelyn: NICODERM CQ - dosed in mg/24 hours       TAKE these medications    apixaban 5 MG Tabs tablet Commonly known Jocelyn: ELIQUIS Take 1 tablet (5 mg total) by mouth 2 (two) times daily.   docusate sodium 100 MG capsule Commonly known Jocelyn: COLACE Take 1 capsule (100 mg total) by  mouth 2 (two) times daily Jocelyn needed for mild constipation.   gabapentin 300 MG capsule Commonly known Jocelyn: NEURONTIN Take 1 capsule (300 mg total) by mouth 3 (three) times daily.   hydrochlorothiazide 25 MG tablet Commonly known Jocelyn: HYDRODIURIL Take 1 tablet (25 mg total) by mouth daily. Start taking on: January 26, 2022   HYDROcodone-acetaminophen 5-325 MG tablet Commonly known Jocelyn: NORCO/VICODIN Take 1 tablet by mouth every 4 (four) hours Jocelyn needed for up to 1 day for moderate pain or severe pain.   insulin aspart 100 UNIT/ML injection Commonly known Jocelyn: novoLOG Inject 0-15 Units into the skin 3 (three) times daily with meals.   Iron 325 (65 Fe) MG Tabs Take 1 tablet (325 mg total) by mouth daily.   polyethylene glycol 17 g packet Commonly known Jocelyn: MIRALAX / GLYCOLAX Take 17 g by mouth daily Jocelyn needed for moderate constipation.  Follow-up Information     Dew, Erskine Squibb, MD Follow up.   Specialties: Vascular Surgery, Radiology, Interventional Cardiology Why: F/u in 1-2 weeks Contact information: [place] [address] [place], [facility] Follow up.   Why: f/u in 1-2 weeks Contact information: [address] [place] [address] [phone]                No Known Allergies  Consultations: Vascular surg  ID   Procedures/Studies: PERIPHERAL VASCULAR CATHETERIZATION  Result Date: 01/18/2022 See surgical note for result.  PERIPHERAL VASCULAR CATHETERIZATION  Result Date: 01/17/2022 See surgical note for result.  US Venous Img Lower Unilateral Left  Result Date: 01/17/2022 CLINICAL DATA:  Left leg pain and swelling EXAM: LEFT LOWER EXTREMITY VENOUS DOPPLER ULTRASOUND TECHNIQUE: Gray-scale sonography with graded compression, Jocelyn well Jocelyn color Doppler and duplex ultrasound were performed to evaluate the lower extremity deep venous systems from the level of the common femoral vein and including the  common femoral, femoral, profunda femoral, popliteal and calf veins including the posterior tibial, peroneal and gastrocnemius veins when visible. The superficial great saphenous vein was also interrogated. Spectral Doppler was utilized to evaluate flow at rest and with distal augmentation maneuvers in the common femoral, femoral and popliteal veins. COMPARISON:  None Available. FINDINGS: Contralateral Common Femoral Vein: Respiratory phasicity is normal and symmetric with the symptomatic side. No evidence of thrombus. Normal compressibility. Common Femoral Vein: Occlusive thrombus is noted with decreased compressibility and occlusive nature. Saphenofemoral Junction: Occlusive thrombus is noted with decreased compressibility Profunda Femoral Vein: Occlusive thrombus is noted with decreased compressibility. Femoral Vein: Occlusive thrombus is noted with decreased compressibility. Popliteal Vein: Occlusive thrombus is noted with decreased compressibility. Calf Veins: Not well visualized Superficial Great Saphenous Vein: No evidence of thrombus. Normal compressibility. Venous Reflux:  None. Other Findings:  None. IMPRESSION: Significant occlusive thrombus within the left lower extremity from the popliteal vein to the common femoral vein. These findings would support that seen on recent CT examination. Electronically Signed   By: Inez Catalina M.D.   On: 01/17/2022 02:44   CT Angio Aortobifemoral W and/or Wo Contrast  Result Date: 01/17/2022 CLINICAL DATA:  Lower leg pain and swelling EXAM: CT ANGIOGRAPHY OF ABDOMINAL AORTA WITH ILIOFEMORAL RUNOFF TECHNIQUE: Multidetector CT imaging of the abdomen, pelvis and lower extremities was performed using the standard protocol during bolus administration of intravenous contrast. Multiplanar CT image reconstructions and MIPs were obtained to evaluate the vascular anatomy. RADIATION DOSE REDUCTION: This exam was performed according to the departmental dose-optimization program  which includes automated exposure control, adjustment of the mA and/or kV according to patient size and/or use of iterative reconstruction technique. CONTRAST:  131mL OMNIPAQUE IOHEXOL 350 MG/ML SOLN COMPARISON:  01/05/2022 FINDINGS: VASCULAR Aorta: Normal caliber aorta without aneurysm, dissection, vasculitis or significant stenosis. Celiac: Mild variant anatomy of the celiac axis is noted with the left hepatic artery arising directly from the aorta. The remainder of the celiac axis is within normal limits. SMA: Patent without evidence of aneurysm, dissection, vasculitis or significant stenosis. Renals: Dual renal arteries are noted on the right with single renal artery on the left. No obstructive changes are seen. IMA: Patent without evidence of aneurysm, dissection, vasculitis or significant stenosis. RIGHT Lower Extremity Inflow: Right common and external iliac artery are within normal limits. Common femoral artery is widely patent. Runoff: Superficial femoral artery and popliteal artery are widely patent. Normal popliteal trifurcation is noted  with runoff primarily via a diminutive anterior tibial artery. Posterior tibial artery is not visualized beyond the mid calf. LEFT Lower Extremity Inflow: Mild stenosis is noted at the origin of the common iliac artery. The thrombus at extends into the lumen on the prior exam is smaller in size but persistent. This may represent a site for potential distal emboli. The external iliac artery is patent. Common femoral artery is patent Jocelyn well. Runoff: Profundus femoral artery is patent. The superficial femoral artery is occluded at its origin. Additionally there is significant stenting in the distal superficial femoral artery extending into the popliteal artery. This stent is occluded throughout its course. Muscular collaterals reconstitute the anterior tibial artery in the proximal calf. The posterior tibial artery is not visualized. Veins: Mild decreased attenuation is  noted in the left iliac venous system. The vein on the left is somewhat larger than that seen on the right. This raises suspicion for deep venous thrombosis given the significant peripheral edema and swelling. Review of the MIP images confirms the above findings. NON-VASCULAR Lower chest: Lung bases are free of acute infiltrate or sizable effusion. Hepatobiliary: No focal liver abnormality is seen. No gallstones, gallbladder wall thickening, or biliary dilatation. Pancreas: Unremarkable. No pancreatic ductal dilatation or surrounding inflammatory changes. Spleen: Normal in size without focal abnormality. Adrenals/Urinary Tract: Adrenal glands are within normal limits. Kidneys demonstrate a normal enhancement pattern bilaterally. Some scarring is noted in the lower pole of the right kidney. Stomach/Bowel: No obstructive or inflammatory changes of the colon are seen. Mild diverticular changes noted without diverticulitis. Small bowel and stomach appear within normal limits. Lymphatic: No lymphadenopathy is noted. Reproductive: Uterus and bilateral adnexa are unremarkable. Other: No abdominal wall hernia or abnormality. No abdominopelvic ascites. Musculoskeletal: There is significant edema involving the left lower extremity and extending into the left buttock some reactive lymphadenopathy is noted in the medial aspect of the left thigh. The left thigh is considerably larger than that the right thigh. These inflammatory changes extend into the calf and to the level of the ankle increased from the prior exam. IMPRESSION: VASCULAR Left lower extremity: There remains thrombus extending into the lumen of the common iliac artery although the burden is less than that seen on the prior exam. There is now occlusion of the superficial femoral artery on the left at its origin which is progressive when compared with the prior exam. The profundus femoral artery is noted with multiple muscular collaterals reconstituting the anterior  tibial artery in the calf. This continues to the level of the ankle. Right lower extremity: Patent superficial femoral and popliteal artery. The popliteal trifurcation is patent Jocelyn well. The dominant runoff vessel is the diminutive anterior tibial artery. Posterior tibial artery is visualized to the mid calf. No other arterial abnormality is noted. Some mild decreased attenuation in the venous structures of the left lower extremity and left iliac veins is noted. Duplex examination is recommended to evaluate for venous thrombosis. NON-VASCULAR Considerable soft tissue swelling is noted involving the left leg from the level of the buttock to the foot. The leg is considerably increased in size. No muscular necrosis is noted. Mild diverticular change without diverticulitis. No other focal abnormality is noted. Electronically Signed   By: Alcide Clever M.D.   On: 01/17/2022 02:23   DG Chest Portable 1 View  Result Date: 01/17/2022 CLINICAL DATA:  Leg pain swelling short of breath EXAM: PORTABLE CHEST 1 VIEW COMPARISON:  01/05/2022 FINDINGS: The heart size and mediastinal contours are  within normal limits. Both lungs are clear. The visualized skeletal structures are unremarkable. IMPRESSION: No active disease. Electronically Signed   By: Donavan Foil M.D.   On: 01/17/2022 01:51   PERIPHERAL VASCULAR CATHETERIZATION  Result Date: 01/07/2022 See surgical note for result.  PERIPHERAL VASCULAR CATHETERIZATION  Result Date: 01/06/2022 See surgical note for result.  DG Chest Portable 1 View  Result Date: 01/05/2022 CLINICAL DATA:  Tachycardia. EXAM: PORTABLE CHEST 1 VIEW COMPARISON:  03/07/2020, lung bases from abdominal CT earlier today. FINDINGS: The cardiomediastinal contours are normal. The lungs are clear. Pulmonary vasculature is normal. No consolidation, pleural effusion, or pneumothorax. No acute osseous abnormalities are seen. IMPRESSION: No acute chest findings. Electronically Signed   By: Keith Rake M.D.   On: 01/05/2022 23:48   CT Angio Aortobifemoral W and/or Wo Contrast  Result Date: 01/05/2022 CLINICAL DATA:  Arterial embolism, lower extremity. LEFT LOWER LEG PAIN EXAM: CT ANGIOGRAPHY OF ABDOMINAL AORTA WITH ILIOFEMORAL RUNOFF TECHNIQUE: Multidetector CT imaging of the abdomen, pelvis and lower extremities was performed using the standard protocol during bolus administration of intravenous contrast. Multiplanar CT image reconstructions and MIPs were obtained to evaluate the vascular anatomy. RADIATION DOSE REDUCTION: This exam was performed according to the departmental dose-optimization program which includes automated exposure control, adjustment of the mA and/or kV according to patient size and/or use of iterative reconstruction technique. CONTRAST:  113mL OMNIPAQUE IOHEXOL 350 MG/ML SOLN COMPARISON:  None Available. FINDINGS: VASCULAR Aorta: No aneurysm or dissection. Infrarenal atherosclerotic calcifications. Celiac: Patent without evidence of aneurysm, dissection, vasculitis or significant stenosis. SMA: Patent without evidence of aneurysm, dissection, vasculitis or significant stenosis. Renals: Both renal arteries are patent without evidence of aneurysm, dissection, vasculitis, fibromuscular dysplasia or significant stenosis. IMA: Patent without evidence of aneurysm, dissection, vasculitis or significant stenosis. RIGHT Lower Extremity Inflow: Common, internal and external iliac arteries are patent without evidence of aneurysm, dissection, vasculitis or significant stenosis. Outflow: Common, superficial and profunda femoral arteries and the popliteal artery are patent without evidence of aneurysm, dissection, vasculitis or significant stenosis. Runoff: Proximal trifurcation vessels are patent in the right calf. The vessels are not visualized beyond the mid calf. It is unclear if this is related to occlusion or bolus timing. LEFT Lower Extremity Inflow: Irregular thrombus extends into the  lumen of the left common iliac artery. Left external iliac artery, internal iliac artery widely patent. Outflow: Common, superficial and profundus femoral arteries are patent. There is a stent noted in the distal superficial femoral artery and popliteal artery. No contrast is seen within the stent or below the stent. Runoff: Non opacification of the trifurcation vessels in the left calf. Veins: No obvious venous abnormality within the limitations of this arterial phase study. Review of the MIP images confirms the above findings. NON-VASCULAR Lower chest: No acute findings Hepatobiliary: No focal hepatic abnormality. Gallbladder unremarkable. Pancreas: No focal abnormality or ductal dilatation. Spleen: No focal abnormality.  Normal size. Adrenals/Urinary Tract: Areas of cortical thinning in the kidneys bilaterally. No renal or adrenal mass. No hydronephrosis. Urinary bladder unremarkable. Stomach/Bowel: Stomach, large and small bowel grossly unremarkable. Lymphatic: No adenopathy Reproductive: Uterus and adnexa unremarkable.  No mass. Other: No free fluid or free air. Musculoskeletal: No acute bony abnormality. IMPRESSION: VASCULAR Aortic atherosclerosis.  No aneurysm or dissection. Irregular plaque in the left common iliac artery extends into the lumen and could be a source of distal emboli. Occlusion of the distal left superficial femoral artery at the level of the stent which extends into the  left popliteal artery. No flow seen within the stent. No flow seen within the trifurcation vessels in the left calf. Widely patent outflow and runoff in the right lower extremity to the level of the mid calf. Non visualization of the vessels below the mid calf. This is unclear if this is due to occlusion or bolus timing. NON-VASCULAR No acute findings in the abdomen or pelvis. Electronically Signed   By: Rolm Baptise M.D.   On: 01/05/2022 23:20   (Echo, Carotid, EGD, Colonoscopy, ERCP)    Subjective: Pt c/o fatigue     Discharge Exam: Vitals:   01/25/22 0525 01/25/22 0800  BP: (!) 144/82 (!) 145/85  Pulse: 90 91  Resp: 16 17  Temp: 98.5 F (36.9 C) 98.9 F (37.2 C)  SpO2: 96% 98%   Vitals:   01/24/22 0800 01/24/22 1943 01/25/22 0525 01/25/22 0800  BP: (!) 158/90 (!) 144/88 (!) 144/82 (!) 145/85  Pulse: 96 94 90 91  Resp: 18 20 16 17   Temp: 98 F (36.7 C) 97.9 F (36.6 C) 98.5 F (36.9 C) 98.9 F (37.2 C)  TempSrc: Oral Oral Oral Oral  SpO2: 98% 97% 96% 98%  Weight:      Height:        General: Pt is alert, awake, not in acute distress Cardiovascular: S1/S2 +, no rubs, no gallops Respiratory: CTA bilaterally, no wheezing, no rhonchi Abdominal: Soft, NT, ND, bowel sounds + Extremities: LLE AKA, no cyanosis    The results of significant diagnostics from this hospitalization (including imaging, microbiology, ancillary and laboratory) are listed below for reference.     Microbiology: Recent Results (from the past 240 hour(s))  Culture, blood (Routine X 2) w Reflex to ID Panel     Status: None   Collection Time: 01/19/22 11:28 AM   Specimen: BLOOD RIGHT FOREARM  Result Value Ref Range Status   Specimen Description BLOOD RIGHT FOREARM  Final   Special Requests   Final    BOTTLES DRAWN AEROBIC AND ANAEROBIC Blood Culture adequate volume   Culture   Final    NO GROWTH 5 DAYS Performed at [facility], [address] [place], [place] [address]    Report Status 01/24/2022 FINAL  Final  Culture, blood (Routine X 2) w Reflex to ID Panel     Status: None   Collection Time: 01/19/22 11:32 AM   Specimen: BLOOD RIGHT HAND  Result Value Ref Range Status   Specimen Description BLOOD RIGHT HAND  Final   Special Requests   Final    BOTTLES DRAWN AEROBIC AND ANAEROBIC Blood Culture adequate volume   Culture   Final    NO GROWTH 5 DAYS Performed at [facility], [address] [place], [place] [address]    Report Status 01/24/2022 FINAL  Final  Aerobic Culture w  Gram Stain (superficial specimen)     Status: None   Collection Time: 01/19/22  6:53 PM   Specimen: Wound  Result Value Ref Range Status   Specimen Description   Final    WOUND Performed at [facility], [address], [place], [place] [address]    Special Requests   Final    LL Performed at [facility], [place],  [address]    Gram Stain NO WBC SEEN NO ORGANISMS SEEN   Final   Culture   Final    NO GROWTH 2 DAYS Performed at [facility], [place] [address] [place], [place]  [address]    Report Status 01/22/2022 FINAL  Final     Labs: BNP (last 3 results) Recent Labs    01/17/22 0116  BNP 59.2   Basic Metabolic Panel: Recent Labs  Lab 01/19/22 0132 01/20/22 0500 01/21/22 0443 01/22/22 0515 01/23/22 0608 01/24/22 0502 01/25/22 0537  NA 137 140 139 143 141 143 144  K 3.6 3.3* 4.1 3.6 3.0* 3.5 3.7  CL 106 111 111 111 110 113* 112*  CO2 24 25 23 23 25 26 26   GLUCOSE 103* 105* 138* 114* 95 102* 101*  BUN 7 8 9 7 8 8 9   CREATININE 0.59 0.66 0.61 0.69 0.49 0.49 0.44  CALCIUM 8.2* 8.4* 8.1* 8.5* 8.6* 8.7* 9.1  MG 1.5* 1.9 1.6* 1.7  --   --   --    Liver Function Tests: No results for input(s): "AST", "ALT", "ALKPHOS", "BILITOT", "PROT", "ALBUMIN" in the last 168 hours. No results for input(s): "LIPASE", "AMYLASE" in the last 168 hours. No results for input(s): "AMMONIA" in the last 168 hours. CBC: Recent Labs  Lab 01/21/22 0443 01/21/22 0740 01/21/22 2145 01/22/22 0515 01/23/22 0608 01/24/22 0502 01/25/22 0537  WBC 12.9*  --   --  12.0* 8.3 6.2 4.8  HGB 6.5*   < > 8.8* 9.3* 9.2* 9.1* 9.3*  HCT 22.6*   < > 28.9* 30.5* 30.4* 30.0* 31.7*  MCV 76.6*  --   --  77.2* 78.8* 79.2* 80.3  PLT 336  --   --  376 389 414* 474*   < > = values in this interval not displayed.   Cardiac Enzymes: Recent Labs  Lab 01/19/22 1853  CKTOTAL 181   BNP: Invalid input(s): "POCBNP" CBG: Recent Labs  Lab 01/24/22 0736  01/24/22 1157 01/24/22 1645 01/24/22 2049 01/25/22 0805  GLUCAP 104* 130* 107* 123* 115*   D-Dimer No results for input(s): "DDIMER" in the last 72 hours. Hgb A1c No results for input(s): "HGBA1C" in the last 72 hours. Lipid Profile No results for input(s): "CHOL", "HDL", "LDLCALC", "TRIG", "CHOLHDL", "LDLDIRECT" in the last 72 hours. Thyroid function studies No results for input(s): "TSH", "T4TOTAL", "T3FREE", "THYROIDAB" in the last 72 hours.  Invalid input(s): "FREET3" Anemia work up No results for input(s): "VITAMINB12", "FOLATE", "FERRITIN", "TIBC", "IRON", "RETICCTPCT" in the last 72 hours. Urinalysis    Component Value Date/Time   COLORURINE AMBER (A) 01/05/2022 2354   APPEARANCEUR CLOUDY (A) 01/05/2022 2354   APPEARANCEUR Hazy 04/24/2013 1419   LABSPEC >1.046 (H) 01/05/2022 2354   LABSPEC 1.018 04/24/2013 1419   PHURINE 5.0 01/05/2022 2354   GLUCOSEU NEGATIVE 01/05/2022 2354   GLUCOSEU Negative 04/24/2013 1419   HGBUR LARGE (A) 01/05/2022 2354   BILIRUBINUR NEGATIVE 01/05/2022 2354   BILIRUBINUR Negative 04/24/2013 1419   KETONESUR 5 (A) 01/05/2022 2354   PROTEINUR 100 (A) 01/05/2022 2354   NITRITE NEGATIVE 01/05/2022 2354   LEUKOCYTESUR NEGATIVE 01/05/2022 2354   LEUKOCYTESUR Negative 04/24/2013 1419   Sepsis Labs Recent Labs  Lab 01/22/22 0515 01/23/22 0608 01/24/22 0502 01/25/22 0537  WBC 12.0* 8.3 6.2 4.8   Microbiology Recent Results (from the past 240 hour(s))  Culture, blood (Routine X 2) w Reflex to ID Panel     Status: None   Collection Time: 01/19/22 11:28 AM   Specimen: BLOOD RIGHT FOREARM  Result Value Ref Range Status   Specimen Description BLOOD RIGHT FOREARM  Final   Special Requests   Final    BOTTLES DRAWN AEROBIC AND ANAEROBIC Blood Culture adequate volume  Culture   Final    NO GROWTH 5 DAYS Performed at [facility], [address] [place], [place] [address]    Report Status 01/24/2022 FINAL  Final  Culture, blood  (Routine X 2) w Reflex to ID Panel     Status: None   Collection Time: 01/19/22 11:32 AM   Specimen: BLOOD RIGHT HAND  Result Value Ref Range Status   Specimen Description BLOOD RIGHT HAND  Final   Special Requests   Final    BOTTLES DRAWN AEROBIC AND ANAEROBIC Blood Culture adequate volume   Culture   Final    NO GROWTH 5 DAYS Performed at [facility], [address], [place], [place] [address]    Report Status 01/24/2022 FINAL  Final  Aerobic Culture w Gram Stain (superficial specimen)     Status: None   Collection Time: 01/19/22  6:53 PM   Specimen: Wound  Result Value Ref Range Status   Specimen Description   Final    WOUND Performed at [facility], [address], [place], [place] [address]    Special Requests   Final    LL Performed at [facility], [address] [place], [place] [address]    Gram Stain NO WBC SEEN NO ORGANISMS SEEN   Final   Culture   Final    NO GROWTH 2 DAYS Performed at [facility], [place] [address] [place], [place] [address]    Report Status 01/22/2022 FINAL  Final     Time coordinating discharge: Over 30 minutes  SIGNED:   Wyvonnia Dusky, MD  [facility] 01/25/2022, 11:31 AM Pager   If 7PM-7AM, please contact night-coverage [email or web address]

## 2022-01-25 NOTE — PMR Pre-admission (Signed)
PMR Admission Coordinator Pre-Admission Assessment  Patient: Jocelyn [name] is an 41 y.o., female MRN: [id number] DOB: October 29, 1980 Height:   Weight:    Insurance Information HMO:     PPO:      PCP:      IPA:      80/20:      OTHER:  PRIMARY: UNINSURED   Gave pt self-pay estimate on 7/17 and reviewed on the telephone  Policy#:       Subscriber:  CM Name:       Phone#:      Fax#:  Pre-Cert#:       Employer:  Benefits:  Phone #:      Name:  Eff. Date:      Deduct:       Out of Pocket Max:       Life Max:  CIR:       SNF:  Outpatient:      Co-Pay:  Home Health:       Co-Pay:  DME:      Co-Pay:  Providers:  SECONDARY:       Policy#:      Phone#:   Financial Counselor: Prescilla Sours      Phone#:   The "Data Collection Information Summary" for patients in Inpatient Rehabilitation Facilities with attached "Privacy Act Statement-Health Care Records" was provided and verbally reviewed with: N/A  Emergency Contact Information Contact Information     Name Relation Home Work Mobile   [place] Mother   [phone]   [place], [place] Daughter   [phone]       Current Medical History  Patient Admitting Diagnosis: left ischemia limb, s/p L AKA History of Present Illness: Pt is a 41 year old female with medical hx significant for: PAD, s/p LLE angiogram with stent placement (01/11/20), s/p thrombolytic therapy and angiogram (03/07/20), mechanical thrombectomy, angioplasty (01/07/22), DM, HTN, iron deficiency anemia, tobacco use. Pt presented to [facility] on 01/17/22 d/t severe left lower leg pain and swelling.Pt also reported blisters on left leg and SOB with exertion. Pt recently hospitalized 01/05/22-01/09/22 d/t left lower extremity ischemia. Venous doppler showed significant occlusive thrombus from popliteal vein to the common femoral vein. CTA revealed acute arterial occlusion of the left SFA. Pt had LLE angiogram with angioplasty left SFA, posterior tibial artery, attempted  penumbra thrombectomy left SFA, and placement of lytic infusion catheter in the left posterior tibial artery on 01/17/22. Pt Hgb 7, transfused 1 unit of PRBC on 7/9.  Pt had LLE angiogram and mechanical thrombectomy on 01/18/22. 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 Pt Hgb 6.3, transfused 2 units PRBC on 7/13. Therapy evaluations completed and CIR recommended d/t pt's deficits in mobility and inability to complete ADLs independently.     Patient's medical record from [facility] has been reviewed by the rehabilitation admission coordinator and physician.  Past Medical History  Past Medical History:  Diagnosis Date   IDA (iron deficiency anemia) 12/17/2019    Has the patient had major surgery during 100 days prior to admission? Yes  Family History   family history includes Hypertension in her brother, mother, and sister.  Current Medications No current facility-administered medications for this encounter.  Current Outpatient Medications:    [START ON 01/26/2022] hydrochlorothiazide (HYDRODIURIL) 25 MG tablet, Take 1 tablet (25 mg total) by mouth daily., Disp: 30 tablet, Rfl: 0   HYDROcodone-acetaminophen (NORCO/VICODIN) 5-325 MG tablet, Take 1 tablet by mouth every 4 (four) hours as needed for up to 1  day for moderate pain or severe pain., Disp: 6 tablet, Rfl: 0   insulin aspart (NOVOLOG) 100 UNIT/ML injection, Inject 0-15 Units into the skin 3 (three) times daily with meals., Disp: , Rfl:   Facility-Administered Medications Ordered in Other Encounters:    0.9 %  sodium chloride infusion, , Intravenous, PRN, Wyn Quaker, Marlow Baars, MD, Stopped at 01/17/22 1835   0.9 %  sodium chloride infusion, 10 mL/hr, Intravenous, Once, Dew, Marlow Baars, MD   acetaminophen (TYLENOL) tablet 650 mg, 650 mg, Oral, Q6H PRN, 650 mg at 01/19/22 0756 **OR** acetaminophen (TYLENOL) suppository 650 mg, 650 mg, Rectal, Q6H PRN, Wyn Quaker, Marlow Baars, MD   apixaban (ELIQUIS) tablet 5 mg, 5 mg, Oral, BID, Beers, Sherlynn Carbon, RPH, 5 mg  at 01/25/22 0957   Chlorhexidine Gluconate Cloth 2 % PADS 6 each, 6 each, Topical, Q0600, Annice Needy, MD, 6 each at 01/24/22 1000   diphenhydrAMINE (BENADRYL) injection 12.5 mg, 12.5 mg, Intravenous, Q6H PRN, 12.5 mg at 01/22/22 0454 **OR** diphenhydrAMINE (BENADRYL) 12.5 MG/5ML elixir 12.5 mg, 12.5 mg, Oral, Q6H PRN, Wyn Quaker, Marlow Baars, MD   ferrous sulfate tablet 325 mg, 325 mg, Oral, Q breakfast, Mayford Knife, Jamiese M, MD, 325 mg at 01/25/22 0710   gabapentin (NEURONTIN) capsule 300 mg, 300 mg, Oral, TID, Charise Killian, MD, 300 mg at 01/25/22 0957   hydrochlorothiazide (HYDRODIURIL) tablet 25 mg, 25 mg, Oral, Daily, Charise Killian, MD, 25 mg at 01/25/22 0957   HYDROcodone-acetaminophen (NORCO/VICODIN) 5-325 MG per tablet 1-2 tablet, 1-2 tablet, Oral, Q4H PRN, Annice Needy, MD, 2 tablet at 01/25/22 1156   insulin aspart (novoLOG) injection 0-15 Units, 0-15 Units, Subcutaneous, TID WC, Annice Needy, MD, 2 Units at 01/24/22 1305   insulin aspart (novoLOG) injection 0-5 Units, 0-5 Units, Subcutaneous, QHS, Dew, Marlow Baars, MD, 2 Units at 01/20/22 2203   morphine (PF) 2 MG/ML injection 2 mg, 2 mg, Intravenous, Q2H PRN, Sunnie Nielsen, DO, 2 mg at 01/24/22 2101   naloxone (NARCAN) injection 0.4 mg, 0.4 mg, Intravenous, PRN **AND** sodium chloride flush (NS) 0.9 % injection 9 mL, 9 mL, Intravenous, PRN, Dew, Marlow Baars, MD   nicotine (NICODERM CQ - dosed in mg/24 hours) patch 21 mg, 21 mg, Transdermal, Daily, Alexander, Natalie, DO   ondansetron (ZOFRAN) tablet 4 mg, 4 mg, Oral, Q6H PRN **OR** ondansetron (ZOFRAN) injection 4 mg, 4 mg, Intravenous, Q6H PRN, Dew, Marlow Baars, MD   Oral care mouth rinse, 15 mL, Mouth Rinse, PRN, Wyn Quaker, Marlow Baars, MD  Patients Current Diet:  Diet Order     None       Precautions / Restrictions     Has the patient had 2 or more falls or a fall with injury in the past year? No  Prior Activity Level    Prior Functional Level Self Care: Did the patient need help  bathing, dressing, using the toilet or eating? Independent  Indoor Mobility: Did the patient need assistance with walking from room to room (with or without device)? Independent  Stairs: Did the patient need assistance with internal or external stairs (with or without device)? Independent  Functional Cognition: Did the patient need help planning regular tasks such as shopping or remembering to take medications? Independent  Patient Information    Patient's Response To:     Home Assistive Devices / Equipment    Prior Device Use: Indicate devices/aids used by the patient prior to current illness, exacerbation or injury? Walker  Current Functional Level Cognition  Extremity Assessment (includes Sensation/Coordination)          ADLs       Mobility       Transfers       Ambulation / Gait / Stairs / Wheelchair Mobility       Posture / Balance      Special needs/care consideration Skin Surgical incision: thigh/left, External urinary catheter and Diabetic management novoLOG 0-5 units daily at bedtime; novoLOG 0-15 units 3x daily at meals   Previous Home Environment (from acute therapy documentation)    Discharge Living Setting    Social/Family/Support Systems    Goals    Decrease burden of Care through IP rehab admission: NA  Possible need for SNF placement upon discharge: Not anticipated  Patient Condition: I have reviewed medical records from [facility], spoken with CSW, and patient and daughter. I discussed via phone for inpatient rehabilitation assessment.  Patient will benefit from ongoing PT and OT, can actively participate in 3 hours of therapy a day 5 days of the week, and can make measurable gains during the admission.  Patient will also benefit from the coordinated team approach during an Inpatient Acute Rehabilitation admission.  The patient will receive intensive therapy as well as Rehabilitation physician, nursing, social  worker, and care management interventions.  Due to bladder management, safety, skin/wound care, disease management, medication administration, pain management, and patient education the patient requires 24 hour a day rehabilitation nursing.  The patient is currently Min G-Min A with mobility and Min-Max A with basic ADLs.  Discharge setting and therapy post discharge at home with home health is anticipated.  Patient has agreed to participate in the Acute Inpatient Rehabilitation Program and will admit today.  Preadmission Screen Completed By:  Erick Colace, 01/25/2022 12:19 PM ______________________________________________________________________   Discussed status with Dr. Wynn Banker on 01/25/22  at 12:19 PM and received approval for admission today.  Admission Coordinator:  Erick Colace, MD, time 12:19 PM/Date 01/25/22    Assessment/Plan: Diagnosis:Left AKA Does the need for close, 24 hr/day Medical supervision in concert with the patient's rehab needs make it unreasonable for this patient to be served in a less intensive setting? Yes Co-Morbidities requiring supervision/potential complications: DM, HTN, Post op anemia and pain Due to bladder management, bowel management, safety, skin/wound care, disease management, medication administration, pain management, and patient education, does the patient require 24 hr/day rehab nursing? Yes Does the patient require coordinated care of a physician, rehab nurse, PT, OT, and SLP to address physical and functional deficits in the context of the above medical diagnosis(es)? Yes Addressing deficits in the following areas: balance, endurance, locomotion, strength, transferring, bowel/bladder control, bathing, dressing, toileting, and psychosocial support Can the patient actively participate in an intensive therapy program of at least 3 hrs of therapy 5 days a week? Yes The potential for patient to make measurable gains while on inpatient rehab is  good Anticipated functional outcomes upon discharge from inpatient rehab: supervision PT, supervision OT, n/a SLP Estimated rehab length of stay to reach the above functional goals is: 7-10d Anticipated discharge destination: Home 10. Overall Rehab/Functional Prognosis: good   MD Signature: Erick Colace M.D. [facility], [facility] Fellow Am Acad of Phys Med and Rehab Diplomate Am Board of Electrodiagnostic Med Fellow Am Board of Interventional Pain

## 2022-01-25 NOTE — Progress Notes (Signed)
Inpatient Rehab Admissions Coordinator:  There is a bed available for pt in CIR today. Dr. Mayford Knife is aware and in agreement. Pt, NSG, and TOC aware.  Wolfgang Phoenix, MS, CCC-SLP Admissions Coordinator [phone]

## 2022-01-25 NOTE — H&P (Addendum)
Physical Medicine and Rehabilitation Admission H&P        Chief Complaint  Patient presents with   Leg Swelling  : HPI: Jocelyn [name] is a 41 year old right-handed female with history of diabetes mellitus, iron deficiency anemia, peripheral arterial disease maintained on Eliquis with history of left lower extremity stent placement July 2021 complicated by in-stent thrombosis requiring thrombolytic therapy and more recently 01/07/2022 undergoing mechanical thrombectomy of left SFA with angioplasty, as well as tobacco use.  Per chart review patient lives with her 19 year old daughter.  1 level home with multiple steps to entry.  Independent prior to admission up until recently using rolling walker.  Presented to [facility] 01/17/2022 with 2-day history of severe pain in the left leg associated with ischemic changes.  Admission chemistries unremarkable except glucose 118, troponin negative, WBC 13,300, hemoglobin 7.4.  Patient underwent left lower extremity angiogram showing occlusion of left SFA, popliteal artery and all 3 tibial vessels with no runoff.  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 [name] .  Her chronic Eliquis was resumed postoperatively.  Acute blood loss anemia she was transfused 4 units PRBCs. Latest hemoglobin 9.3.  Therapy evaluations completed due to patient decreased functional mobility was admitted for a comprehensive rehab program.  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  HENT:  Negative for hearing loss.   Eyes:  Negative for blurred vision and double vision.  Respiratory:  Negative for cough and shortness of breath.   Cardiovascular:  Positive for leg swelling. Negative for chest pain and palpitations.  Gastrointestinal:  Positive for constipation. Negative for heartburn, nausea and vomiting.  Genitourinary:  Negative for dysuria, flank pain  and hematuria.  Musculoskeletal:  Positive for joint pain and myalgias.  Skin:  Negative for rash.  Neurological:  Positive for sensory change and weakness.  All other systems reviewed and are negative.       Past Medical History:  Diagnosis Date   IDA (iron deficiency anemia) 12/17/2019         Past Surgical History:  Procedure Laterality Date   AMPUTATION Left 01/20/2022    Procedure: AMPUTATION ABOVE KNEE;  Surgeon: Annice Needy, MD;  Location: [facility] ORS;  Service: Vascular;  Laterality: Left;   APPENDECTOMY       LOWER EXTREMITY ANGIOGRAPHY Left 01/11/2020    Procedure: Lower Extremity Angiography;  Surgeon: Annice Needy, MD;  Location: [facility] INVASIVE CV LAB;  Service: Cardiovascular;  Laterality: Left;   LOWER EXTREMITY ANGIOGRAPHY Left 03/06/2020    Procedure: Lower Extremity Angiography;  Surgeon: Annice Needy, MD;  Location: [facility] INVASIVE CV LAB;  Service: Cardiovascular;  Laterality: Left;   LOWER EXTREMITY ANGIOGRAPHY Left 03/07/2020    Procedure: Lower Extremity Angiography;  Surgeon: Annice Needy, MD;  Location: [facility] INVASIVE CV LAB;  Service: Cardiovascular;  Laterality: Left;   LOWER EXTREMITY ANGIOGRAPHY Left 01/06/2022    Procedure: Lower Extremity Angiography;  Surgeon: Annice Needy, MD;  Location: [facility] INVASIVE CV LAB;  Service: Cardiovascular;  Laterality: Left;   LOWER EXTREMITY ANGIOGRAPHY Left 01/07/2022    Procedure: Lower Extremity Angiography;  Surgeon: Annice Needy, MD;  Location: [facility] INVASIVE CV LAB;  Service: Cardiovascular;  Laterality: Left;   LOWER EXTREMITY ANGIOGRAPHY Left 01/18/2022    Procedure: Lower Extremity Angiography;  Surgeon: Annice Needy, MD;  Location: [facility] INVASIVE CV  LAB;  Service: Cardiovascular;  Laterality: Left;   LOWER EXTREMITY INTERVENTION Bilateral 01/17/2022    Procedure: LOWER EXTREMITY INTERVENTION;  Surgeon: Learta Codding, MD;  Location: [facility] INVASIVE CV LAB;  Service: Cardiovascular;  Laterality: Bilateral;   PERIPHERAL VASCULAR  THROMBECTOMY Left 01/20/2022    Procedure: PERIPHERAL VASCULAR THROMBECTOMY Left Femoral vein;  Surgeon: Annice Needy, MD;  Location: [facility] ORS;  Service: Vascular;  Laterality: Left;         Family History  Problem Relation Age of Onset   Hypertension Mother     Hypertension Sister     Hypertension Brother      Social History:  reports that she has been smoking cigarettes. She has been smoking an average of .5 packs per day. She has never used smokeless tobacco. She reports current alcohol use. She reports that she does not use drugs. Allergies: No Known Allergies       Medications Prior to Admission  Medication Sig Dispense Refill   amLODipine (NORVASC) 5 MG tablet Take 1 tablet (5 mg total) by mouth daily. 30 tablet 1   apixaban (ELIQUIS) 5 MG TABS tablet Take 1 tablet (5 mg total) by mouth 2 (two) times daily. 60 tablet 2   docusate sodium (COLACE) 100 MG capsule Take 1 capsule (100 mg total) by mouth 2 (two) times daily as needed for mild constipation. 10 capsule 0   Ferrous Sulfate (IRON) 325 (65 Fe) MG TABS Take 1 tablet (325 mg total) by mouth daily. 60 tablet 0   gabapentin (NEURONTIN) 300 MG capsule Take 1 capsule (300 mg total) by mouth 3 (three) times daily. 90 capsule 1   polyethylene glycol (MIRALAX / GLYCOLAX) 17 g packet Take 17 g by mouth daily as needed for moderate constipation. 14 each 0   nicotine (NICODERM CQ - DOSED IN MG/24 HOURS) 21 mg/24hr patch Place 1 patch (21 mg total) onto the skin daily. (Patient not taking: Reported on 01/17/2022) 28 patch 0          Home: Home Living Family/patient expects to be discharged to:: Private residence Living Arrangements: Children Available Help at Discharge: Family, Available 24 hours/day Type of Home: Apartment Home Access: Stairs to enter [employer] of Steps: 9-10 Entrance Stairs-Rails: Right, Left Home Layout: One level Bathroom Shower/Tub: Engineer, manufacturing systems: Standard Bathroom Accessibility:  Yes Home Equipment: Agricultural consultant (2 wheels), BSC/3in1   Functional History: Prior Function Prior Level of Function : Independent/Modified Independent Mobility Comments: PTA amb with no AD, recently using RW ADLs Comments: 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  Pt returned to the recliner. Transfers Overall transfer level: Needs assistance Equipment used: Rolling walker (2 wheels) Transfers: Sit to/from Stand Sit to Stand: Min assist General transfer comment: step by step vcs for approrpaite RW use Ambulation/Gait Ambulation/Gait assistance: Min guard Gait Distance (Feet): 2 Feet Assistive device: Rolling walker (2 wheels) Gait Pattern/deviations: Step-to pattern, Decreased step length - right, Decreased step length - left, Decreased stance time - left, Antalgic   ADL: ADL Overall ADL's : Needs assistance/impaired Eating/Feeding: Set up, Sitting Grooming: Wash/dry hands, Wash/dry face, Sitting, Set up Upper Body Dressing : Minimal assistance, Sitting Lower Body Dressing: Maximal assistance Toilet Transfer: Minimal assistance, Rolling  walker (2 wheels), BSC/3in1 Toilet Transfer Details (indicate cue type and reason): SPT to bsc with step by step vcs Toileting- Clothing Manipulation and Hygiene: Minimal assistance, Sit to/from stand, Sitting/lateral lean   Cognition: Cognition Overall Cognitive Status: Within Functional Limits for tasks assessed Orientation Level: Oriented X4 Cognition Arousal/Alertness: Awake/alert Behavior During Therapy: WFL for tasks assessed/performed Overall Cognitive Status: Within Functional Limits for tasks assessed General Comments: pt much more present during treatment session  today.   Physical Exam: Blood pressure (!) 145/85, pulse 91, temperature 98.9 F (37.2 C), temperature source Oral, resp. rate 17, height 5\' 5"  (1.651 m), weight 96.3 kg, last menstrual period 01/05/2022, SpO2 98 %. Physical Exam Skin:    Comments: Left AKA dressing clean dry and intact.  Appropriately tender  Neurological:     Comments: Patient is alert oriented x3 and follows commands.    General: No acute distress Mood and affect are appropriate Heart: Regular rate and rhythm no rubs murmurs or extra sounds Lungs: Clear to auscultation, breathing unlabored, no rales or wheezes Abdomen: Positive bowel sounds, soft nontender to palpation, nondistended Extremities: No clubbing, cyanosis, or edema Skin: No evidence of breakdown, no evidence of rash Neurologic: Cranial nerves II through XII intact, motor strength is 5/5 in bilateral deltoid, bicep, tricep, grip, RIght hip flexor, knee extensors, ankle dorsiflexor and plantar flexor Sensory exam normal sensation to light touch n in bilateral upper and lower extremities Cerebellar exam normal finger to nose to finger as well as heel to shin in bilateral upper and Right lower extremities Musculoskeletal: Full range of motion in all 4 extremities Left AKA stump with edema, wrapped.     Lab Results Last 48 Hours        Results for orders placed or performed during the hospital encounter of 01/17/22 (from the past 48 hour(s))  Glucose, capillary     Status: Abnormal    Collection Time: 01/23/22 11:48 AM  Result Value Ref Range    Glucose-Capillary 106 (H) 70 - 99 mg/dL      Comment: Glucose reference range applies only to samples taken after fasting for at least 8 hours.    Comment 1 Notify RN      Comment 2 Document in Chart    Glucose, capillary     Status: Abnormal    Collection Time: 01/23/22  4:34 PM  Result Value Ref Range    Glucose-Capillary 106 (H) 70 - 99 mg/dL      Comment: Glucose reference range applies only to samples  taken after fasting for at least 8 hours.  Glucose, capillary     Status: Abnormal    Collection Time: 01/23/22  9:06 PM  Result Value Ref Range    Glucose-Capillary 131 (H) 70 - 99 mg/dL      Comment: Glucose reference range applies only to samples taken after fasting for at least 8 hours.  CBC     Status: Abnormal    Collection Time: 01/24/22  5:02 AM  Result Value Ref Range    WBC 6.2 4.0 - 10.5 K/uL    RBC 3.79 (L) 3.87 - 5.11 MIL/uL    Hemoglobin 9.1 (L) 12.0 - 15.0 g/dL    HCT 01/26/22 (L) 40.9 - 46.0 %    MCV 79.2 (L) 80.0 - 100.0 fL    MCH 24.0 (L) 26.0 - 34.0 pg    MCHC 30.3 30.0 - 36.0 g/dL    RDW 81.1 (H) 91.4 - 15.5 %    Platelets 414 (  H) 150 - 400 K/uL    nRBC 0.0 0.0 - 0.2 %      Comment: Performed at [facility], [address] [place], [place] [address]  Basic metabolic panel     Status: Abnormal    Collection Time: 01/24/22  5:02 AM  Result Value Ref Range    Sodium 143 135 - 145 mmol/L    Potassium 3.5 3.5 - 5.1 mmol/L    Chloride 113 (H) 98 - 111 mmol/L    CO2 26 22 - 32 mmol/L    Glucose, Bld 102 (H) 70 - 99 mg/dL      Comment: Glucose reference range applies only to samples taken after fasting for at least 8 hours.    BUN 8 6 - 20 mg/dL    Creatinine, Ser 6.040.49 0.44 - 1.00 mg/dL    Calcium 8.7 (L) 8.9 - 10.3 mg/dL    GFR, Estimated >54>60 >09>60 mL/min      Comment: (NOTE) Calculated using the CKD-EPI Creatinine Equation (2021)      Anion gap 4 (L) 5 - 15      Comment: Performed at [facility], [address] [place], [place] [address]  Glucose, capillary     Status: Abnormal    Collection Time: 01/24/22  7:36 AM  Result Value Ref Range    Glucose-Capillary 104 (H) 70 - 99 mg/dL      Comment: Glucose reference range applies only to samples taken after fasting for at least 8 hours.  Glucose, capillary     Status: Abnormal    Collection Time: 01/24/22 11:57 AM  Result Value Ref Range    Glucose-Capillary 130 (H) 70 - 99 mg/dL       Comment: Glucose reference range applies only to samples taken after fasting for at least 8 hours.  Glucose, capillary     Status: Abnormal    Collection Time: 01/24/22  4:45 PM  Result Value Ref Range    Glucose-Capillary 107 (H) 70 - 99 mg/dL      Comment: Glucose reference range applies only to samples taken after fasting for at least 8 hours.  Glucose, capillary     Status: Abnormal    Collection Time: 01/24/22  8:49 PM  Result Value Ref Range    Glucose-Capillary 123 (H) 70 - 99 mg/dL      Comment: Glucose reference range applies only to samples taken after fasting for at least 8 hours.  CBC     Status: Abnormal    Collection Time: 01/25/22  5:37 AM  Result Value Ref Range    WBC 4.8 4.0 - 10.5 K/uL    RBC 3.95 3.87 - 5.11 MIL/uL    Hemoglobin 9.3 (L) 12.0 - 15.0 g/dL    HCT 47.831.7 (L) 29.536.0 - 46.0 %    MCV 80.3 80.0 - 100.0 fL    MCH 23.5 (L) 26.0 - 34.0 pg    MCHC 29.3 (L) 30.0 - 36.0 g/dL    RDW 62.121.8 (H) 30.811.5 - 15.5 %    Platelets 474 (H) 150 - 400 K/uL    nRBC 0.0 0.0 - 0.2 %      Comment: Performed at [facility], [address] [place], [place] [address]  Basic metabolic panel     Status: Abnormal    Collection Time: 01/25/22  5:37 AM  Result Value Ref Range    Sodium 144 135 - 145 mmol/L    Potassium 3.7 3.5 - 5.1 mmol/L  Chloride 112 (H) 98 - 111 mmol/L    CO2 26 22 - 32 mmol/L    Glucose, Bld 101 (H) 70 - 99 mg/dL      Comment: Glucose reference range applies only to samples taken after fasting for at least 8 hours.    BUN 9 6 - 20 mg/dL    Creatinine, Ser 7.82 0.44 - 1.00 mg/dL    Calcium 9.1 8.9 - 42.3 mg/dL    GFR, Estimated >53 >61 mL/min      Comment: (NOTE) Calculated using the CKD-EPI Creatinine Equation (2021)      Anion gap 6 5 - 15      Comment: Performed at [facility], [address] [place], [place] [address]  Glucose, capillary     Status: Abnormal    Collection Time: 01/25/22  8:05 AM  Result Value Ref Range     Glucose-Capillary 115 (H) 70 - 99 mg/dL      Comment: Glucose reference range applies only to samples taken after fasting for at least 8 hours.      Imaging Results (Last 48 hours)  No results found.         Blood pressure (!) 145/85, pulse 91, temperature 98.9 F (37.2 C), temperature source Oral, resp. rate 17, height 5\' 5"  (1.651 m), weight 96.3 kg, last menstrual period 01/05/2022, SpO2 98 %.   Medical Problem List and Plan: 1. Functional deficits secondary to left AKA/left femoral thrombectomy 01/20/2022 secondary to advanced peripheral arterial disease             -patient may  shower             -ELOS/Goals: 7-10d 2.  Antithrombotics: -DVT/anticoagulation:  Pharmaceutical: Eliquis             -antiplatelet therapy: N/A 3. Pain Management: Neurontin 300 mg 3 times daily increase to 400mg  TID for phantom pain , hydrocodone as needed 4. Mood/Behavior/Sleep: Provide emotional support             -antipsychotic agents: N/A 5. Neuropsych/cognition: This patient is capable of making decisions on her own behalf. 6. Skin/Wound Care: Routine skin checks 7. Fluids/Electrolytes/Nutrition: Routine in and outs with follow-up chemistries 8.  Iron deficiency anemia.  Continue iron supplement.  Follow-up CBC 9.  Tobacco abuse.  NicoDerm patch.  Provide counseling 10.  Type II diabetes mellitus.  Latest hemoglobin A1c 5.1.  SSI. 11.  Hypertension.  Norvasc 5 mg and HCTZ 25 mg daily.  Monitor with increased mobility     03/23/2022, PA-C 01/25/2022  "I have personally performed a face to face diagnostic evaluation of this patient.  Additionally, I have reviewed and concur with the physician assistant's documentation above."  Charlton Amor M.D. [facility] Fellow Am Acad of Phys Med and Rehab Diplomate Am Board of Electrodiagnostic Med Fellow Am Board of Interventional Pain

## 2022-01-26 ENCOUNTER — Encounter (HOSPITAL_COMMUNITY): Payer: Self-pay | Admitting: Physical Medicine and Rehabilitation

## 2022-01-26 DIAGNOSIS — E876 Hypokalemia: Secondary | ICD-10-CM

## 2022-01-26 DIAGNOSIS — I10 Essential (primary) hypertension: Secondary | ICD-10-CM

## 2022-01-26 DIAGNOSIS — E1169 Type 2 diabetes mellitus with other specified complication: Secondary | ICD-10-CM

## 2022-01-26 DIAGNOSIS — I739 Peripheral vascular disease, unspecified: Secondary | ICD-10-CM

## 2022-01-26 DIAGNOSIS — E669 Obesity, unspecified: Secondary | ICD-10-CM

## 2022-01-26 LAB — CBC WITH DIFFERENTIAL/PLATELET
Abs Immature Granulocytes: 0.02 10*3/uL (ref 0.00–0.07)
Basophils Absolute: 0.1 10*3/uL (ref 0.0–0.1)
Basophils Relative: 1 %
Eosinophils Absolute: 0.3 10*3/uL (ref 0.0–0.5)
Eosinophils Relative: 5 %
HCT: 29.8 % — ABNORMAL LOW (ref 36.0–46.0)
Hemoglobin: 9.2 g/dL — ABNORMAL LOW (ref 12.0–15.0)
Immature Granulocytes: 0 %
Lymphocytes Relative: 29 %
Lymphs Abs: 1.5 10*3/uL (ref 0.7–4.0)
MCH: 24.4 pg — ABNORMAL LOW (ref 26.0–34.0)
MCHC: 30.9 g/dL (ref 30.0–36.0)
MCV: 79 fL — ABNORMAL LOW (ref 80.0–100.0)
Monocytes Absolute: 0.6 10*3/uL (ref 0.1–1.0)
Monocytes Relative: 12 %
Neutro Abs: 2.8 10*3/uL (ref 1.7–7.7)
Neutrophils Relative %: 53 %
Platelets: 466 10*3/uL — ABNORMAL HIGH (ref 150–400)
RBC: 3.77 MIL/uL — ABNORMAL LOW (ref 3.87–5.11)
RDW: 21.5 % — ABNORMAL HIGH (ref 11.5–15.5)
Smear Review: NORMAL
WBC: 5.3 10*3/uL (ref 4.0–10.5)
nRBC: 0 % (ref 0.0–0.2)

## 2022-01-26 LAB — COMPREHENSIVE METABOLIC PANEL
ALT: 23 U/L (ref 0–44)
AST: 26 U/L (ref 15–41)
Albumin: 2.2 g/dL — ABNORMAL LOW (ref 3.5–5.0)
Alkaline Phosphatase: 102 U/L (ref 38–126)
Anion gap: 6 (ref 5–15)
BUN: 6 mg/dL (ref 6–20)
CO2: 29 mmol/L (ref 22–32)
Calcium: 9.2 mg/dL (ref 8.9–10.3)
Chloride: 106 mmol/L (ref 98–111)
Creatinine, Ser: 0.54 mg/dL (ref 0.44–1.00)
GFR, Estimated: >60 mL/min (ref >60)
Glucose, Bld: 101 mg/dL — ABNORMAL HIGH (ref 70–99)
Potassium: 3.2 mmol/L — ABNORMAL LOW (ref 3.5–5.1)
Sodium: 141 mmol/L (ref 135–145)
Total Bilirubin: 0.4 mg/dL (ref 0.3–1.2)
Total Protein: 5.6 g/dL — ABNORMAL LOW (ref 6.5–8.1)

## 2022-01-26 LAB — GLUCOSE, CAPILLARY
Glucose-Capillary: 127 mg/dL — ABNORMAL HIGH (ref 70–99)
Glucose-Capillary: 111 mg/dL — ABNORMAL HIGH (ref 70–99)
Glucose-Capillary: 161 mg/dL — ABNORMAL HIGH (ref 70–99)
Glucose-Capillary: 109 mg/dL — ABNORMAL HIGH (ref 70–99)

## 2022-01-26 MED ORDER — ENSURE MAX PROTEIN PO LIQD
11.0000 [oz_av] | Freq: Every day | ORAL | Status: DC
  Administered 2022-01-26 – 2022-01-27 (×2): 11 [oz_av] via ORAL

## 2022-01-26 MED ORDER — POTASSIUM CHLORIDE CRYS ER 20 MEQ PO TBCR
20.0000 meq | EXTENDED_RELEASE_TABLET | Freq: Every day | ORAL | Status: DC
  Administered 2022-01-26 – 2022-02-02 (×8): 20 meq via ORAL
  Filled 2022-01-26 (×8): qty 1

## 2022-01-26 NOTE — Progress Notes (Addendum)
Patient ID: Jocelyn [name], female   DOB: 15-Aug-1980, 41 y.o.   MRN: [id number]  SW met with pt in room to introduce self, explain role, and discuss discharge process. SW provided updates from team conference, and informed d/c date pending at this time (2 weeks?). SW informed will continue to provide updates.   [name] spoke with pt dtr [name] informing on above. SW encouraged follow-up if needed, if not, SW will provide updates as they are available.   SW received updates from [name] confirming a medicaid application has been completed for patient.   Loralee Pacas, MSW, [place] Office: [phone] Cell: [phone] Fax: [phone]

## 2022-01-26 NOTE — Patient Care Conference (Signed)
Inpatient RehabilitationTeam Conference and Plan of Care Update Date: 01/26/2022   Time: 11:12 AM   Patient Name: Jocelyn [name]      Medical Record Number: [id number]  Date of Birth: 01-02-1981 Sex: Female         Room/Bed: 4M04C/4M04C-01 Payor Info: Payor: /    Admit Date/Time:  01/25/2022  2:16 PM  Primary Diagnosis:  S/P AKA (above knee amputation) unilateral, left [facility])  Hospital Problems: Principal Problem:   S/P AKA (above knee amputation) unilateral, left [facility])    Expected Discharge Date: Expected Discharge Date:  (pending)  Team Members Present: Physician leading conference: Dr. Faith Rogue Social Worker Present: Cecile Sheerer, LCSWA Nurse Present: Kennyth Arnold, RN;Other (comment) Vedia Pereyra, RN) PT Present: Bernie Covey, PT OT Present: Ardis Rowan, COTA;Jennifer Katrinka Blazing, OT SLP Present: Feliberto Gottron, SLP PPS Coordinator present : Fae Pippin, SLP     Current Status/Progress Goal Weekly Team Focus  Bowel/Bladder   pt cont of b/b  remain continent  toilet as needed   Swallow/Nutrition/ Hydration             ADL's   evals pending  evals pending  evals pending   Mobility   eval pending  eval pending  eval pending   Communication             Safety/Cognition/ Behavioral Observations            Pain   pt c/o 8/10 pat laka surgical site  decrease pain at incion site  assess q shift and prn   Skin   L aka, with staples  adequate heling  no skin break down or infection     Discharge Planning:  To be assessed. Per EMR, pt will d/c to home with her daugther who will provide 24/7 care. SW will confirm there are no barriers to discharge.   Team Discussion: S/P AKA. History of PAD, Smoker, HTN, Incision intact with staples, Stump sock ordered. PRN pain medication for pain at incision site. Continent x 2. Declined smoking cessions materials. Tearful during therapy, declined Neurophysch/Chaplain. Home with daughter to apartment, 9-10 steps, 8  people living in apartment. Patient on target to meet rehab goals: yes, Supervision goals. Evaluations pending  *See Care Plan and progress notes for long and short-term goals.   Revisions to Treatment Plan:  Adjustments to medications  Teaching Needs: Family education, medication education, skin/wound care, transfer/gait training, etc.  Current Barriers to Discharge: Decreased caregiver support, Home enviroment access/layout, Wound care, and Weight bearing restrictions  Possible Resolutions to Barriers: Family education Medication management Order recommended DME     Medical Summary Current Status: left aka d/t pad, obese, diabetic and hypertensive  Barriers to Discharge: Medical stability   Possible Resolutions to Barriers/Weekly Focus: pain control, wound care, daily assessment of labs and patient data   Continued Need for Acute Rehabilitation Level of Care: The patient requires daily medical management by a physician with specialized training in physical medicine and rehabilitation for the following reasons: Direction of a multidisciplinary physical rehabilitation program to maximize functional independence : Yes Medical management of patient stability for increased activity during participation in an intensive rehabilitation regime.: Yes Analysis of laboratory values and/or radiology reports with any subsequent need for medication adjustment and/or medical intervention. : Yes   I attest that I was present, lead the team conference, and concur with the assessment and plan of the team.   [name] 01/26/2022, 7:08 PM

## 2022-01-26 NOTE — Progress Notes (Signed)
Occupational Therapy Assessment and Plan  Patient Details  Name: Jocelyn [name] MRN: [id number] Date of Birth: 1981/03/09  OT Diagnosis: acute pain, muscle weakness (generalized), and swelling of limb Rehab Potential: Rehab Potential (ACUTE ONLY): Excellent ELOS: 7 days   Today's Date: 01/26/2022 OT Individual Time: 1000-1055 OT Individual Time Calculation (min): 55 min     Hospital Problem: Principal Problem:   S/P AKA (above knee amputation) unilateral, left (HCC)   Past Medical History:  Past Medical History:  Diagnosis Date   IDA (iron deficiency anemia) 12/17/2019   Past Surgical History:  Past Surgical History:  Procedure Laterality Date   AMPUTATION Left 01/20/2022   Procedure: AMPUTATION ABOVE KNEE;  Surgeon: Algernon Huxley, MD;  Location: [facility];  Service: Vascular;  Laterality: Left;   APPENDECTOMY     LOWER EXTREMITY ANGIOGRAPHY Left 01/11/2020   Procedure: Lower Extremity Angiography;  Surgeon: Algernon Huxley, MD;  Location: [place] [facility];  Service: Cardiovascular;  Laterality: Left;   LOWER EXTREMITY ANGIOGRAPHY Left 03/06/2020   Procedure: Lower Extremity Angiography;  Surgeon: Algernon Huxley, MD;  Location: [place] [facility];  Service: Cardiovascular;  Laterality: Left;   LOWER EXTREMITY ANGIOGRAPHY Left 03/07/2020   Procedure: Lower Extremity Angiography;  Surgeon: Algernon Huxley, MD;  Location: [place] [facility];  Service: Cardiovascular;  Laterality: Left;   LOWER EXTREMITY ANGIOGRAPHY Left 01/06/2022   Procedure: Lower Extremity Angiography;  Surgeon: Algernon Huxley, MD;  Location: [place] [facility];  Service: Cardiovascular;  Laterality: Left;   LOWER EXTREMITY ANGIOGRAPHY Left 01/07/2022   Procedure: Lower Extremity Angiography;  Surgeon: Algernon Huxley, MD;  Location: [place] [facility];  Service: Cardiovascular;  Laterality: Left;   LOWER EXTREMITY ANGIOGRAPHY Left 01/18/2022   Procedure: Lower Extremity Angiography;  Surgeon: Algernon Huxley, MD;  Location:  [place] [facility];  Service: Cardiovascular;  Laterality: Left;   LOWER EXTREMITY INTERVENTION Bilateral 01/17/2022   Procedure: LOWER EXTREMITY INTERVENTION;  Surgeon: Zara Chess, MD;  Location: [place] [facility];  Service: Cardiovascular;  Laterality: Bilateral;   PERIPHERAL VASCULAR THROMBECTOMY Left 01/20/2022   Procedure: PERIPHERAL VASCULAR THROMBECTOMY Left Femoral vein;  Surgeon: Algernon Huxley, MD;  Location: [facility];  Service: Vascular;  Laterality: Left;    Assessment & Plan Clinical Impression:  HPI: Jocelyn [name] is a 41 year old right-handed female with history of diabetes mellitus, iron deficiency anemia, peripheral arterial disease maintained on Eliquis with history of left lower extremity stent placement July 3235 complicated by in-stent thrombosis requiring thrombolytic therapy and more recently 01/07/2022 undergoing mechanical thrombectomy of left SFA with angioplasty, as well as tobacco use.  Per chart review patient lives with her 65 year old daughter.  1 level home with multiple steps to entry.  Independent prior to admission up until recently using rolling walker.  Presented to [facility] 01/17/2022 with 2-day history of severe pain in the left leg associated with ischemic changes.  Admission chemistries unremarkable except glucose 118, troponin negative, WBC 13,300, hemoglobin 7.4.  Patient underwent left lower extremity angiogram showing occlusion of left SFA, popliteal artery and all 3 tibial vessels with no runoff.  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 Leotis Pain. Her chronic Eliquis was resumed postoperatively.  Acute blood loss anemia she was transfused 4 units PRBCs. Latest hemoglobin 9.3.  Therapy evaluations completed due to patient decreased functional mobility was admitted for a comprehensive rehab program. Patient transferred  to CIR on 01/25/2022 .    Patient currently requires min with basic  self-care skills secondary to muscle weakness and muscle paralysis, decreased cardiorespiratoy endurance, and decreased standing balance, decreased postural control, and decreased balance strategies.  Prior to hospitalization, patient could complete all ADLs with independent .  Patient will benefit from skilled intervention to increase independence with basic self-care skills prior to discharge home with care partner.  Anticipate patient will require 24 hour supervision and follow up outpatient.  OT - End of Session Activity Tolerance: Tolerates 30+ min activity with multiple rests Endurance Deficit: Yes Endurance Deficit Description: in standing, pt likely would not tolerate >10 minutes but with activties in seated able to tolerate 30+ minutes OT Assessment Rehab Potential (ACUTE ONLY): Excellent OT Barriers to Discharge: [place] home environment;Home environment access/layout;Wound Care;Weight bearing restrictions OT Patient demonstrates impairments in the following area(s): Balance;Safety;Sensory;Edema;Skin Integrity;Endurance;Motor;Pain OT Basic ADL's Functional Problem(s): Bathing;Dressing;Toileting OT Transfers Functional Problem(s): Toilet;Tub/Shower OT Additional Impairment(s): Other (comment) (functional use of LLE) OT Plan OT Intensity: Minimum of 1-2 x/day, 45 to 90 minutes OT Frequency: 5 out of 7 days OT Duration/Estimated Length of Stay: 7 days OT Treatment/Interventions: Balance/vestibular training;Discharge planning;Pain management;Self Care/advanced ADL retraining;Therapeutic Activities;UE/LE Coordination activities;Community reintegration;DME/adaptive equipment instruction;Neuromuscular re-education;Psychosocial support;Skin care/wound managment;UE/LE Strength taining/ROM;Wheelchair propulsion/positioning;Therapeutic Exercise;Patient/family education;Functional mobility training OT Self Feeding Anticipated Outcome(s): Independent OT Basic Self-Care Anticipated Outcome(s):  S OT Toileting Anticipated Outcome(s): S OT Bathroom Transfers Anticipated Outcome(s): S OT Recommendation Patient destination: Home Follow Up Recommendations: Outpatient OT Equipment Recommended: To be determined  OT Evaluation Precautions/Restrictions  Precautions Precautions: Fall Precaution Comments: L AKA Required Braces or Orthoses: Other Brace Other Brace: LLE wrapped Restrictions Weight Bearing Restrictions: Yes LLE Weight Bearing: Non weight bearing Other Position/Activity Restrictions: LLE General Chart Reviewed: Yes Additional Pertinent History: diabetes mellitus, iron deficiency anemia, peripheral arterial disease Response to Previous Treatment: Patient with no complaints from previous session Family/Caregiver Present: No Pain Pain Assessment Pain Scale: 0-10 Pain Score: 6  Faces Pain Scale: Hurts little more Pain Type: Phantom pain;Surgical pain Pain Location: Leg Pain Orientation: Left Pain Descriptors / Indicators: Sharp;Shooting Pain Onset: On-going Patients Stated Pain Goal: 0 Pain Intervention(s): Rest;Emotional support Multiple Pain Sites: No Home Living/Prior Functioning Home Living Family/patient expects to be discharged to:: Private residence Living Arrangements: Children Available Help at Discharge: Family, Available 24 hours/day Type of Home: Apartment Home Access: Stairs to enter Technical brewer of Steps: 11 Entrance Stairs-Rails: Right, Left (Pt reported alternating hand rails on R/L) Home Layout: One level Bathroom Shower/Tub: Government social research officer Accessibility: Yes  Lives With: Family IADL History Homemaking Responsibilities: Yes Meal Prep Responsibility: Primary Laundry Responsibility: Primary Cleaning Responsibility: Primary Bill Paying/Finance Responsibility: Primary Shopping Responsibility: Primary Child Care Responsibility: Primary Homemaking Comments: Pt lives with 8 family members and  has a partner that does not live at the same home. 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 with grandchildren and watching tv/movies IADL Comments: primary for all IADLs at home Prior Function Level of Independence: Independent with basic ADLs, Independent with homemaking with wheelchair, Independent with transfers, Independent with gait, Independent with homemaking with ambulation  Able to Take Stairs?: Yes Driving: Yes Vocation: Full time employment Leisure: Hobbies-yes (Comment) (spending time with family) Vision Baseline Vision/History: 0 No visual deficits Ability to See in Adequate Light: 0 Adequate Patient Visual Report: No change from baseline Vision Assessment?: No apparent visual deficits Perception  Perception: Within Functional Limits Praxis Praxis: Impaired Praxis Impairment Details: Motor planning Cognition Cognition Overall Cognitive Status: Within Functional Limits for tasks assessed Arousal/Alertness: Awake/alert Orientation Level: Person;Place;Situation Person: Oriented Place: Oriented Situation: Oriented Memory: Appears intact Safety/Judgment: Impaired Comments: D/t recent L AKA pt likely with some deficits in LLE and frequent phantom pain occurring throughout session. Brief Interview for Mental Status (BIMS) Repetition of Three Words (First Attempt): 3 Temporal Orientation: Year: Correct Temporal Orientation: Month: Accurate within 5 days Temporal Orientation: Day: Correct Recall: "Sock": Yes, no cue required Recall: "Blue": Yes, no cue required Recall: "Bed": Yes, no cue required BIMS Summary Score: 15 Sensation Sensation Light Touch: Impaired by gross assessment (impaired at distal end of LLE) Hot/Cold: Appears Intact Proprioception: Appears Intact Coordination Gross Motor Movements are Fluid and Coordinated: No Fine Motor Movements  are Fluid and Coordinated: Yes Coordination and Movement Description: impaired secondary to L AKA Finger Nose Finger Test: Intact Motor  Motor Motor: Abnormal postural alignment and control Motor - Skilled Clinical Observations: impaired d/t L AKA  Trunk/Postural Assessment  Cervical Assessment Cervical Assessment: Within Functional Limits Thoracic Assessment Thoracic Assessment: Within Functional Limits Lumbar Assessment Lumbar Assessment: Exceptions to [facility] Postural Control Postural Control: Deficits on evaluation Trunk Control: likely limited d/t altered balance d/t L AKA  Balance Balance Balance Assessed: Yes Static Sitting Balance Static Sitting - Balance Support: Feet supported Static Sitting - Level of Assistance: 5: Stand by assistance Static Sitting - Comment/# of Minutes: requested additional time after sitting up from supine before moving more Dynamic Sitting Balance Dynamic Sitting - Balance Support: Feet supported;During functional activity Dynamic Sitting - Level of Assistance: 4: Min assist Dynamic Sitting Balance - Compensations: able to reach for objects but limited d/t decreased balance and strength Dynamic Sitting - Balance Activities: Reaching for objects Static Standing Balance Static Standing - Balance Support: During functional activity Static Standing - Level of Assistance: 4: Min assist Dynamic Standing Balance Dynamic Standing - Balance Support: During functional activity;Bilateral upper extremity supported Dynamic Standing - Level of Assistance: 3: Mod assist Dynamic Standing - Balance Activities: Reaching for objects Dynamic Standing - Comments: attempted to complete grooming at the sink but d/t fatigue and general deconditioning, sat in w/c Extremity/Trunk Assessment RUE Assessment RUE Assessment: Within Functional Limits General Strength Comments: MMT 5/5 shoulder/elbow flexion, grip intact LUE Assessment LUE Assessment: Within Functional  Limits General Strength Comments: MMT 5/5 shoulder/elbow flexion, grip intact  Care Tool Care Tool Self Care Eating   Eating Assist Level: Set up assist    Oral Care    Oral Care Assist Level: Set up assist    Bathing   Body parts bathed by patient: Right arm;Left arm;Right lower leg;Chest;Abdomen;Front perineal area;Face;Buttocks;Right upper leg Body parts bathed by helper:  (lower back and bottom) Body parts n/a: Buttocks Assist Level: Minimal Assistance - Patient > 75%    Upper Body Dressing(including orthotics)   What is the patient wearing?: Hospital gown only   Assist Level: Contact Guard/Touching assist    Lower Body Dressing (excluding footwear)   What is the patient wearing?: Hospital gown only (LLE is wrapped) Assist for lower body dressing: Minimal Assistance - Patient > 75%    Putting on/Taking off footwear   What is the patient wearing?: Non-skid slipper socks Assist for footwear: Minimal Assistance - Patient > 75%       Care Tool Toileting Toileting activity   Assist for toileting: Moderate Assistance - Patient 50 - 74%     Care Tool Bed  Mobility Roll left and right activity   Roll left and right assist level: Contact Guard/Touching assist    Sit to lying activity   Sit to lying assist level: Contact Guard/Touching assist    Lying to sitting on side of bed activity   Lying to sitting on side of bed assist level: the ability to move from lying on the back to sitting on the side of the bed with no back support.: Contact Guard/Touching assist     Care Tool Transfers Sit to stand transfer   Sit to stand assist level: Minimal Assistance - Patient > 75%    Chair/bed transfer   Chair/bed transfer assist level: Minimal Assistance - Patient > 75%     Toilet transfer   Assist Level: Moderate Assistance - Patient 50 - 74%     Care Tool Cognition  Expression of Ideas and Wants Expression of Ideas and Wants: 4. Without difficulty (complex and basic) -  expresses complex messages without difficulty and with speech that is clear and easy to understand  Understanding Verbal and Non-Verbal Content Understanding Verbal and Non-Verbal Content: 4. Understands (complex and basic) - clear comprehension without cues or repetitions   Memory/Recall Ability Memory/Recall Ability : Current season;Location of own room;Staff names and faces;That he or she is in a hospital/hospital unit   Refer to Care Plan for [place] 1 OT Short Term Goal 1 (Week 1): STG = LTG  Recommendations for other services: None    Skilled Therapeutic Intervention Skilled OT evaluation completed with the creation of pt centered OT POC. Pt educated on condition, ELOS, rehab expectations, and fall risk reduction strategies throughout session. Pt alert and agreeable to OT session requesting to bathe and change clothes. Pt completed all bed mobility with (S) but required extra time before standing. Stood from EOB with RW Min A and completed functional mobility to the sink. Sat in w/c and completed hygiene, grooming, bathing, and dressing overall at [place] as detailed below. Pt left in w/c with alarm on, call bell in reach, and all needs met.  ADL ADL Eating: Set up Grooming: Setup Where Assessed-Grooming: Sitting at sink Upper Body Bathing: Supervision/safety Where Assessed-Upper Body Bathing: Sitting at sink Lower Body Bathing: Minimal assistance Where Assessed-Lower Body Bathing: Sitting at sink Upper Body Dressing: Contact guard Where Assessed-Upper Body Dressing: Sitting at sink Lower Body Dressing: Moderate assistance Toileting: Moderate assistance Toilet Transfer: Moderate assistance Tub/Shower Transfer: Moderate assistance Social research officer, government: Moderate assistance ADL Comments: Pt did not shower d/t fears of getting incisions wet. Pt completed wash up at the sink. Mobility  Bed Mobility Bed Mobility: Rolling Right;Right Sidelying to  Sit;Supine to Sit Rolling Right: Supervision/verbal cueing Right Sidelying to Sit: Supervision/Verbal cueing Supine to Sit: Minimal Assistance - Patient > 75% Transfers Sit to Stand: Minimal Assistance - Patient > 75% Stand to Sit: Minimal Assistance - Patient > 75%   Discharge Criteria: Patient will be discharged from OT if patient refuses treatment 3 consecutive times without medical reason, if treatment goals not met, if there is a change in medical status, if patient makes no progress towards goals or if patient is discharged from hospital.  The above assessment, treatment plan, treatment alternatives and goals were discussed and mutually agreed upon: by patient  Catalina Lunger 01/26/2022, 1:09 PM

## 2022-01-26 NOTE — Evaluation (Signed)
Physical Therapy Assessment and Plan  Patient Details  Name: Jocelyn [name] MRN: [id number] Date of Birth: 07-18-1980  PT Diagnosis: Difficulty walking, Dizziness and giddiness, Impaired sensation, Muscle spasms, Muscle weakness, and Pain in LLE residual limb Rehab Potential: Good ELOS: 7-10 days   Today's Date: 01/26/2022 PT Individual Time: 1445-1600 PT Individual Time Calculation (min): 75 min    Hospital Problem: Principal Problem:   S/P AKA (above knee amputation) unilateral, left (HCC)   Past Medical History:  Past Medical History:  Diagnosis Date   IDA (iron deficiency anemia) 12/17/2019   Past Surgical History:  Past Surgical History:  Procedure Laterality Date   AMPUTATION Left 01/20/2022   Procedure: AMPUTATION ABOVE KNEE;  Surgeon: Algernon Huxley, MD;  Location: [facility];  Service: Vascular;  Laterality: Left;   APPENDECTOMY     LOWER EXTREMITY ANGIOGRAPHY Left 01/11/2020   Procedure: Lower Extremity Angiography;  Surgeon: Algernon Huxley, MD;  Location: [place] [facility];  Service: Cardiovascular;  Laterality: Left;   LOWER EXTREMITY ANGIOGRAPHY Left 03/06/2020   Procedure: Lower Extremity Angiography;  Surgeon: Algernon Huxley, MD;  Location: [place] [facility];  Service: Cardiovascular;  Laterality: Left;   LOWER EXTREMITY ANGIOGRAPHY Left 03/07/2020   Procedure: Lower Extremity Angiography;  Surgeon: Algernon Huxley, MD;  Location: [place] [facility];  Service: Cardiovascular;  Laterality: Left;   LOWER EXTREMITY ANGIOGRAPHY Left 01/06/2022   Procedure: Lower Extremity Angiography;  Surgeon: Algernon Huxley, MD;  Location: [place] [facility];  Service: Cardiovascular;  Laterality: Left;   LOWER EXTREMITY ANGIOGRAPHY Left 01/07/2022   Procedure: Lower Extremity Angiography;  Surgeon: Algernon Huxley, MD;  Location: [place] [facility];  Service: Cardiovascular;  Laterality: Left;   LOWER EXTREMITY ANGIOGRAPHY Left 01/18/2022   Procedure: Lower Extremity Angiography;  Surgeon: Algernon Huxley, MD;  Location: [place] [facility];  Service: Cardiovascular;  Laterality: Left;   LOWER EXTREMITY INTERVENTION Bilateral 01/17/2022   Procedure: LOWER EXTREMITY INTERVENTION;  Surgeon: Zara Chess, MD;  Location: [place] [facility];  Service: Cardiovascular;  Laterality: Bilateral;   PERIPHERAL VASCULAR THROMBECTOMY Left 01/20/2022   Procedure: PERIPHERAL VASCULAR THROMBECTOMY Left Femoral vein;  Surgeon: Algernon Huxley, MD;  Location: [facility];  Service: Vascular;  Laterality: Left;    Assessment & Plan Clinical Impression: Patient is a Jocelyn [name] with history of diabetes mellitus, iron deficiency anemia, peripheral arterial disease maintained on Eliquis with history of left lower extremity stent placement July 5643 complicated by in-stent thrombosis requiring thrombolytic therapy and more recently 01/07/2022 undergoing mechanical thrombectomy of left SFA with angioplasty, as well as tobacco use.  Per chart review patient lives with her 33 year old daughter.  1 level home with multiple steps to entry.  Independent prior to admission up until recently using rolling walker.  [facility]ed to [facility]17[facility] with 2-day history of severe pain in the left leg associated with ischemic changes.  Admission chemistries unremarkable except glucose 118, troponin negative, WBC 13,300, hemoglobin 7.4.  Patient underwent left lower extremity angiogram showing occlusion of left SFA, popliteal artery and all 3 tibial vessels with no runoff.  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 Leotis Pain .  Her chronic Eliquis was resumed postoperatively.  Acute blood loss anemia she was transfused 4 units PRBCs. Latest hemoglobin 9.3.  Therapy evaluations completed due to patient decreased functional mobility was admitted for a comprehensive rehab program.  Patient transferred to CIR on 01/25/2022 .   Patient currently requires min assist  with mobility secondary to muscle weakness, decreased cardiorespiratoy endurance, ,, and decreased sitting balance, decreased standing balance, and decreased balance strategies.  Prior to hospitalization, patient was modified independent  with mobility and lived with Family (both dtrs, both SOs, two grandkids) in a [place] home.  Home access is 11Stairs to enter.  Patient will benefit from skilled PT intervention to maximize safe functional mobility, minimize fall risk, and decrease caregiver burden for planned discharge home with 24 hour supervision.  Anticipate patient will benefit from follow up [facility] at discharge.  PT - End of Session Activity Tolerance: Tolerates 30+ min activity with multiple rests Endurance Deficit: Yes PT Assessment Rehab Potential (ACUTE/IP ONLY): Good PT Barriers to Discharge: [place] home environment;Decreased caregiver support;Home environment access/layout;Wound Care;Insurance for SNF coverage;Weight;Weight bearing restrictions;Medication compliance;Nutrition means PT Patient demonstrates impairments in the following area(s): Balance;Edema;Endurance;Motor;Nutrition;Pain;Perception;Safety;Sensory;Skin Integrity PT Transfers Functional Problem(s): Bed Mobility;Bed to Chair;Car;Furniture PT Locomotion Functional Problem(s): Ambulation;Wheelchair Mobility;Stairs PT Plan PT Intensity: Minimum of 1-2 x/day ,45 to 90 minutes PT Frequency: 5 out of 7 days PT Duration Estimated Length of Stay: 7-10 days PT Treatment/Interventions: Ambulation/gait training;Community reintegration;DME/adaptive equipment instruction;Neuromuscular re-education;Psychosocial support;Stair training;UE/LE Strength taining/ROM;Wheelchair propulsion/positioning;Balance/vestibular training;Discharge planning;Functional electrical stimulation;Pain management;Skin care/wound management;Therapeutic Activities;UE/LE Coordination activities;Cognitive remediation/compensation;Disease  management/prevention;Functional mobility training;Patient/family education;Therapeutic Exercise PT Transfers Anticipated Outcome(s): Mod I PT Locomotion Anticipated Outcome(s): supervision PT Recommendation Recommendations for Other Services: Therapeutic Recreation consult Therapeutic Recreation Interventions: Kitchen group;Stress management Follow Up Recommendations: Home health PT;Outpatient PT;24 hour supervision/assistance Patient destination: Home Equipment Recommended: Rolling walker with 5" wheels;Wheelchair (measurements);Wheelchair cushion (measurements);To be determined   PT Evaluation Precautions/Restrictions Precautions Precautions: Fall Precaution Comments: L AKA Required Braces or Orthoses: Other Brace Other Brace: retention sock to be donned 24/7 except for wound care and dressing/ bathing; shrinker to be used after staple removal Restrictions Weight Bearing Restrictions: Yes LLE Weight Bearing: Non weight bearing General   Vital Signs Pain Pain Assessment Pain Scale: 0-10 Pain Score: 7  Pain Type: Surgical pain Pain Location: Leg Pain Orientation: Left Pain Descriptors / Indicators: Shooting;Sharp;Grimacing;Guarding;Aching Pain Onset: On-going Patients Stated Pain Goal: 0 Pain Interference Pain Interference Pain Effect on Sleep: 3. Frequently Pain Interference with Therapy Activities: 2. Occasionally Pain Interference with Day-to-Day Activities: 2. Occasionally Home Living/Prior Functioning Home Living Living Arrangements: Children;Other relatives Available Help at Discharge: Family;Available 24 hours/day Type of Home: Apartment Home Access: Stairs to enter Entrance Stairs-Number of Steps: 11 Entrance Stairs-Rails: Right;Left (Pt reported alternating hand rails on R/L) Home Layout: One level Bathroom Shower/Tub: Government social research officer Accessibility: Yes  Lives With: Family (both dtrs, both SOs, two grandkids) Prior  Function Level of Independence: Independent with basic ADLs;Independent with transfers;Independent with gait;Independent with homemaking with ambulation  Able to Take Stairs?: Yes Driving: Yes 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 Leisure: Hobbies-yes (Comment) (spending time with family) Vision/Perception  Vision - History Ability to See in Adequate Light: 0 Adequate Perception Perception: Within Functional Limits Praxis Praxis: Intact  Cognition Overall Cognitive Status: Within Functional Limits for tasks assessed Arousal/Alertness: Awake/alert Orientation Level: Oriented X4 Year: 2023 Month: July Day of Week: Correct Memory: Appears intact Awareness: Appears intact Safety/Judgment: Impaired Comments: Any spikes in surgical site pain cause pt to react quickly to attempt to sit Sensation Sensation Light Touch: Impaired by gross assessment (WFL RLE; 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) Coordination Gross Motor Movements are Fluid and Coordinated: No Fine Motor Movements  are Fluid and Coordinated: Yes Coordination and Movement Description: impaired secondary to L AKA and training needed for balance Finger Nose Finger Test: Intact Heel Shin Test: unable d/t LLE AKA Motor  Motor Motor: Within Functional Limits Motor - Skilled Clinical Observations: favors being to R side in supine, sitting and standing d/t LLE pain   Trunk/Postural Assessment  Cervical Assessment Cervical Assessment: Within Functional Limits Thoracic Assessment Thoracic Assessment: Within Functional Limits Lumbar Assessment Lumbar Assessment: Within Functional Limits Postural Control Postural Control: Within Functional Limits Trunk Control: choses to lean to R d/t pain with pressure to LLE  Balance Balance Balance Assessed: Yes Static Sitting Balance Static Sitting - Balance Support: Feet supported Static Sitting - Level of Assistance: 5: Stand by  assistance Dynamic Sitting Balance Dynamic Sitting - Balance Support: Feet supported;During functional activity Dynamic Sitting - Level of Assistance: Other (comment) (CGA) Dynamic Sitting Balance - Compensations: initiates reach in all directions, limited by pain in LLE and biased toward R to guard for LLE pain Dynamic Sitting - Balance Activities: Reaching for objects Static Standing Balance Static Standing - Balance Support: During functional activity;Bilateral upper extremity supported Static Standing - Level of Assistance: Other (comment) (CGA) Dynamic Standing Balance Dynamic Standing - Balance Support: During functional activity;Bilateral upper extremity supported Dynamic Standing - Level of Assistance: 3: Mod assist Dynamic Standing - Balance Activities: Reaching for objects;Reaching across midline Extremity Assessment      RLE Assessment RLE Assessment: Exceptions to [facility] General Strength Comments: Grossly 4 to 4+/ 5 prox to dist LLE Assessment General Strength Comments: unable to test d/t pain and NWB  Care Tool Care Tool Bed Mobility Roll left and right activity   Roll left and right assist level: Supervision/Verbal cueing    Sit to lying activity   Sit to lying assist level: Supervision/Verbal cueing    Lying to sitting on side of bed activity   Lying to sitting on side of bed assist level: the ability to move from lying on the back to sitting on the side of the bed with no back support.: Contact Guard/Touching assist     Care Tool Transfers Sit to stand transfer   Sit to stand assist level: Minimal Assistance - Patient > 75%    Chair/bed transfer   Chair/bed transfer assist level: Minimal Assistance - Patient > 75%     Toilet transfer   Assist Level: Minimal Assistance - Patient > 75%    Car transfer   Car transfer assist level: Contact Guard/Touching assist      Care Tool Locomotion Ambulation   Assist level: Contact Guard/Touching assist Assistive  device: Walker-rolling Max distance: 30 ft  Walk 10 feet activity   Assist level: Contact Guard/Touching assist Assistive device: Walker-rolling   Walk 50 feet with 2 turns activity Walk 50 feet with 2 turns activity did not occur: Safety/medical concerns      Walk 150 feet activity Walk 150 feet activity did not occur: Safety/medical concerns      Walk 10 feet on uneven surfaces activity Walk 10 feet on uneven surfaces activity did not occur: Safety/medical concerns      Stairs   Assist level: Minimal Assistance - Patient > 75% Stairs assistive device: Walker Max number of stairs: 1  Walk up/down 1 step activity   Walk up/down 1 step (curb) assist level: Minimal Assistance - Patient > 75% Walk up/down 1 step or curb assistive device: Walker  Walk up/down 4 steps activity Walk up/down 4 steps activity did not  occur: Safety/medical concerns      Walk up/down 12 steps activity Walk up/down 12 steps activity did not occur: Safety/medical concerns      Pick up small objects from floor Pick up small object from the floor (from standing position) activity did not occur: Safety/medical concerns      Wheelchair Is the patient using a wheelchair?: Yes Type of Wheelchair: Manual   Wheelchair assist level: Contact Guard/Touching assist Max wheelchair distance: 50 ft  Wheel 50 feet with 2 turns activity   Assist Level: Contact Guard/Touching assist  Wheel 150 feet activity   Assist Level: Total Assistance - Patient < 25%    Refer to Care Plan for Long Term Goals  SHORT TERM GOAL WEEK 1 PT Short Term Goal 1 (Week 1): STG = LTG d/t ELOS  Recommendations for other services: Therapeutic Recreation  Kitchen group and Stress management  Skilled Therapeutic Intervention Mobility Bed Mobility Bed Mobility: Supine to Sit;Sit to Supine Supine to Sit: Minimal Assistance - Patient > 75% Sit to Supine: Supervision/Verbal cueing Transfers Transfers: Sit to Stand;Stand to Sit;Stand Pivot  Transfers;Squat Pivot Transfers Sit to Stand: Contact Guard/Touching assist;Minimal Assistance - Patient > 75% Stand to Sit: Contact Guard/Touching assist;Minimal Assistance - Patient > 75% Stand Pivot Transfers: Minimal Assistance - Patient > 75% Squat Pivot Transfers: Contact Guard/Touching assist Transfer (Assistive device): Rolling walker Locomotion  Gait Ambulation: Yes Gait Assistance: Contact Guard/Touching assist Gait Distance (Feet): 30 Feet Assistive device: Rolling walker Gait Gait: Yes Gait Pattern: Impaired Gait Pattern:  (Hop-to pattern) Gait velocity: decreased Stairs / Additional Locomotion Stairs: Yes Stairs Assistance: Minimal Assistance - Patient > 75% Stair Management Technique: With walker Number of Stairs: 1 Height of Stairs: 4 Wheelchair Mobility Wheelchair Mobility: Yes Wheelchair Assistance: Development worker, international aid: Both upper extremities Wheelchair Parts Management: Supervision/cueing  Skilled Intervention: PT Evaluation completed; see above for results. PT educated patient in roles of PT vs OT, PT POC, rehab potential, rehab goals, and discharge recommendations along with recommendation for follow-up rehabilitation services. Individual treatment initiated:  Patient supine in bed upon PT arrival. Patient alert and agreeable to PT session. C/o surgical site pain at LLE and rated at 5/10. Down from 8/10 one hour prior. Pt provided with pain meds just prior to 2pm.  Education provided on pain etiology, pain mgmt, mental practice with pain and mental practice on re-wiring brain for end of limb location. Discussed mirror therapy and potential for use if phantom limb pain continues after more time spent in standing position. Also educated on need for continuous pain medication at this time. Discussed with nursing at end of session to check on pt every 4 hours to see if pain medication is desired as pt may have trouble with maintaining  correct timing of medication in order to keep pain low.   Therapeutic Activity: Bed Mobility: Patient performed supine -->sit with CGA to reach upright seated position. Sit-->supine performed with supervision. Provided vc for technique and protection of residual limb. Transfers: Patient performed squat pivot transfer from bed-->w/c after demonstrating technique. Performs with CGA and noted good strength. Sit<>stand at [facility] performed throughout session with MinA initially and improving to light CGA by end of session. Stand pivot performed at car transfer with CGA and good pivot noted on ball of R foot.   Gait Training:  Education provided on change from lateral sway with bipedal walking to need to pay attention to head/ feet relationship around hand placement. Instructed pt to keep hand position on  RW ahead of foot placement for improved BOS and balance on landing. Patient ambulated 30 feet using hop-to gait with RW and CGA. Demonstrated good technique.   Pt educated in use of strong BUE held in extension over RW in order to maintain balance and lift foot from floor for 3sec. Performed x5. Progressed to step training with use of RW over 2" step. After demonstration and verbal instruction, pt improves quality of step up onto step and back to floor with very gentle landing. Progressed to 4" step with continued gentle landing on and off of step.   Wheelchair Mobility:  Patient propelled wheelchair 50 feet using BUE with CGA/ supervision. Provided vc/tc for technique and parts management.   Retention socks and shrinkers delivered from local prosthetist. Discussion with surgeon re: wear protocol. Pt to wear retention sock 24/7 and removed for wound care and dressing/ bathing as needed.   Patient supine  in bed at end of session with brakes locked, bed alarm set, and all needs within reach.   Discharge Criteria: Patient will be discharged from PT if patient refuses treatment 3 consecutive times without  medical reason, if treatment goals not met, if there is a change in medical status, if patient makes no progress towards goals or if patient is discharged from hospital.  The above assessment, treatment plan, treatment alternatives and goals were discussed and mutually agreed upon: by patient  Alger Simons PT, DPT, CSRS 01/26/2022, 5:50 PM

## 2022-01-26 NOTE — Progress Notes (Signed)
Orthopedic Tech Progress Note Patient Details:  Jocelyn [name] [birth year] [id number]  Called in order to [employer] for an AKA RETENTION SOCK WITH WAIST BELT.  Patient ID: Jocelyn [name], female   DOB: [birth year], 40 y.o.   MRN: [id number]  [name] 01/26/2022, 1:08 PM

## 2022-01-26 NOTE — Progress Notes (Signed)
Occupational Therapy Session Note  Patient Details  Name: SETAREH ROM MRN: [id number] Date of Birth: 1980-08-19  Today's Date: 01/26/2022 OT Individual Time: 1000-1055 OT Individual Time Calculation (min): 55 min    Short Term Goals: Week 1:  OT Short Term Goal 1 (Week 1): STG = LTG  Skilled Therapeutic Interventions/Progress Updates:  Pt resting in w/c upon arrival. OT intervention with focus on review of goals, discharge planning including home setup and DME requirements, sit<>stand, and standing balance to increase independence with BADLs. Pt has tub/shower at home. Pt states her bed is a low surface. Will evaluate optimal height for increased independence at home. Sit<>stand with CGA. Static standing balance with CGA. Pt became emotional during session. Chaplain services and neuropsych services offered. Pt declined at this time. Pt remained seated in w/c with belt alarm activated.  Therapy Documentation Precautions:  Precautions Precautions: Fall Precaution Comments: L AKA Required Braces or Orthoses: Other Brace Other Brace: LLE wrapped Restrictions Weight Bearing Restrictions: Yes LLE Weight Bearing: Non weight bearing Other Position/Activity Restrictions: LLE Pain: Pt reports LLE phantom pain standing>sitting; emotional support  Therapy/Group: Individual Therapy  Rich Brave 01/26/2022, 2:44 PM

## 2022-01-26 NOTE — Plan of Care (Signed)
  Problem: RH Balance Goal: LTG Patient will maintain dynamic standing with ADLs (OT) Description: LTG:  Patient will maintain dynamic standing balance with assist during activities of daily living (OT)  Flowsheets (Taken 01/26/2022 1211) LTG: Pt will maintain dynamic standing balance during ADLs with: Supervision/Verbal cueing   Problem: Sit to Stand Goal: LTG:  Patient will perform sit to stand in prep for activites of daily living with assistance level (OT) Description: LTG:  Patient will perform sit to stand in prep for activites of daily living with assistance level (OT) Flowsheets (Taken 01/26/2022 1211) LTG: PT will perform sit to stand in prep for activites of daily living with assistance level: Supervision/Verbal cueing   Problem: RH Bathing Goal: LTG Patient will bathe all body parts with assist levels (OT) Description: LTG: Patient will bathe all body parts with assist levels (OT) Flowsheets (Taken 01/26/2022 1211) LTG: Pt will perform bathing with assistance level/cueing: Supervision/Verbal cueing LTG: Position pt will perform bathing: Shower   Problem: RH Dressing Goal: LTG Patient will perform lower body dressing w/assist (OT) Description: LTG: Patient will perform lower body dressing with assist, with/without cues in positioning using equipment (OT) Flowsheets (Taken 01/26/2022 1211) LTG: Pt will perform lower body dressing with assistance level of: Supervision/Verbal cueing   Problem: RH Toileting Goal: LTG Patient will perform toileting task (3/3 steps) with assistance level (OT) Description: LTG: Patient will perform toileting task (3/3 steps) with assistance level (OT)  Flowsheets (Taken 01/26/2022 1211) LTG: Pt will perform toileting task (3/3 steps) with assistance level: Supervision/Verbal cueing   Problem: RH Toilet Transfers Goal: LTG Patient will perform toilet transfers w/assist (OT) Description: LTG: Patient will perform toilet transfers with assist,  with/without cues using equipment (OT) Flowsheets (Taken 01/26/2022 1211) LTG: Pt will perform toilet transfers with assistance level of: Supervision/Verbal cueing   Problem: RH Tub/Shower Transfers Goal: LTG Patient will perform tub/shower transfers w/assist (OT) Description: LTG: Patient will perform tub/shower transfers with assist, with/without cues using equipment (OT) Flowsheets (Taken 01/26/2022 1211) LTG: Pt will perform tub/shower stall transfers with assistance level of: Supervision/Verbal cueing LTG: Pt will perform tub/shower transfers from:  Tub/shower combination  Walk in shower  Tub   Problem: RH Awareness Goal: LTG: Patient will demonstrate awareness during functional activites type of (OT) Description: LTG: Patient will demonstrate awareness during functional activites type of (OT) Flowsheets (Taken 01/26/2022 1211) Patient will demonstrate awareness during functional activites type of: Anticipatory LTG: Patient will demonstrate awareness during functional activites type of (OT): Supervision

## 2022-01-26 NOTE — Progress Notes (Signed)
PROGRESS NOTE   Subjective/Complaints: Feels a little warm in her room. Left leg still sore.   ROS: Patient denies fever, rash, sore throat, blurred vision, dizziness, nausea, vomiting, diarrhea, cough, shortness of breath or chest pain, joint or back/neck pain, headache, or mood change.   Objective:   No results found. Recent Labs    01/25/22 0537 01/26/22 0536  WBC 4.8 5.3  HGB 9.3* 9.2*  HCT 31.7* 29.8*  PLT 474* 466*   Recent Labs    01/25/22 0537 01/26/22 0536  NA 144 141  K 3.7 3.2*  CL 112* 106  CO2 26 29  GLUCOSE 101* 101*  BUN 9 6  CREATININE 0.44 0.54  CALCIUM 9.1 9.2    Intake/Output Summary (Last 24 hours) at 01/26/2022 1256 Last data filed at 01/25/2022 2108 Gross per 24 hour  Intake 120 ml  Output --  Net 120 ml        Physical Exam: Vital Signs Blood pressure (!) 147/87, pulse 90, temperature 98.5 F (36.9 C), temperature source Oral, resp. rate 16, height 5\' 5"  (1.651 m), weight 93.6 kg, last menstrual period 01/05/2022, SpO2 97 %.  General: Alert and oriented x 3, No apparent distress, obese HEENT: Head is normocephalic, atraumatic, PERRLA, EOMI, sclera anicteric, oral mucosa pink and moist, dentition intact, ext ear canals clear,  Neck: Supple without JVD or lymphadenopathy Heart: Reg rate and rhythm. No murmurs rubs or gallops Chest: CTA bilaterally without wheezes, rales, or rhonchi; no distress Abdomen: Soft, non-tender, non-distended, bowel sounds positive. Extremities: No clubbing, cyanosis, or edema. Pulses are 2+ Psych: a little anxious Pt is cooperative Skin: left AK incision intact with mild serous drainage from center Neuro:  Alert and oriented x 3. Normal insight and awareness. Intact Memory. Normal language and speech. Cranial nerve exam unremarkable  UE motor 5/5. LLE limited by pain. RLE 3-4/5.  Musculoskeletal: left leg still quite edematous, tender to palpation     Assessment/Plan: 1. Functional deficits which require 3+ hours per day of interdisciplinary therapy in a comprehensive inpatient rehab setting. Physiatrist is providing close team supervision and 24 hour management of active medical problems listed below. Physiatrist and rehab team continue to assess barriers to discharge/monitor patient progress toward functional and medical goals  Care Tool:  Bathing    Body parts bathed by patient: Right arm, Left arm, Right lower leg, Chest, Abdomen, Front perineal area, Face, Buttocks, Right upper leg   Body parts bathed by helper:  (lower back and bottom) Body parts n/a: Buttocks   Bathing assist Assist Level: Minimal Assistance - Patient > 75%     Upper Body Dressing/Undressing Upper body dressing   What is the patient wearing?: Hospital gown only    Upper body assist Assist Level: Contact Guard/Touching assist    Lower Body Dressing/Undressing Lower body dressing      What is the patient wearing?: Hospital gown only (LLE is wrapped)     Lower body assist Assist for lower body dressing: Minimal Assistance - Patient > 75%     Toileting Toileting    Toileting assist Assist for toileting: Moderate Assistance - Patient 50 - 74%     Transfers Chair/bed  transfer  Transfers assist  Chair/bed transfer activity did not occur: Safety/medical concerns  Chair/bed transfer assist level: Minimal Assistance - Patient > 75%     Locomotion Ambulation   Ambulation assist              Walk 10 feet activity   Assist           Walk 50 feet activity   Assist           Walk 150 feet activity   Assist           Walk 10 feet on uneven surface  activity   Assist           Wheelchair     Assist               Wheelchair 50 feet with 2 turns activity    Assist            Wheelchair 150 feet activity     Assist          Blood pressure (!) 147/87, pulse 90, temperature  98.5 F (36.9 C), temperature source Oral, resp. rate 16, height 5\' 5"  (1.651 m), weight 93.6 kg, last menstrual period 01/05/2022, SpO2 97 %.  Medical Problem List and Plan: 1. Functional deficits secondary to left AKA/left femoral thrombectomy 01/20/2022 secondary to advanced peripheral arterial disease             -patient may  shower             -ELOS/Goals: 7-10d  -Patient is beginning CIR therapies today including PT and OT  2.  Antithrombotics: -DVT/anticoagulation:  Pharmaceutical: Eliquis             -antiplatelet therapy: N/A 3. 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 Mood/Behavior/Sleep: Provide emotional support             -antipsychotic agents: N/A 5. Neuropsych/cognition: This patient is capable of making decisions on her own behalf. 6. Skin/Wound Care: continue dry dressing to stump. Order stump sock with waist belt 7. Fluids/Electrolytes/Nutrition:  encourage PO  -Potassium 3.2-->begin supplement  -protein supp for low albumin 8.  Iron deficiency anemia.  Continue iron supplement.     -hgb 9.2 7/18 9.  Tobacco abuse.  NicoDerm patch.  Provide counseling 10.  Type II diabetes mellitus.  Latest hemoglobin A1c 5.1.  SSI.  CBG (last 3)  Recent Labs    01/25/22 2033 01/26/22 0555 01/26/22 1213  GLUCAP 112* 127* 111*    11.  Hypertension.  Norvasc 5 mg and HCTZ 25 mg daily.  Monitor with increased mobility  7/18 fair to borderline control     LOS: 1 days A FACE TO FACE EVALUATION WAS PERFORMED  01/28/22 01/26/2022, 12:56 PM

## 2022-01-26 NOTE — Progress Notes (Signed)
Inpatient Rehabilitation Care Coordinator Assessment and Plan Patient Details  Name: Jocelyn [name] MRN: [id number] Date of Birth: [birth year]  Today's Date: 01/26/2022  Hospital Problems: Principal Problem:   S/P AKA (above knee amputation) unilateral, left (HCC)  Past Medical History:  Past Medical History:  Diagnosis Date   IDA (iron deficiency anemia) 12/17/2019   Past Surgical History:  Past Surgical History:  Procedure Laterality Date   AMPUTATION Left 01/20/2022   Procedure: AMPUTATION ABOVE KNEE;  Surgeon: Annice Needy, MD;  Location: [facility] ORS;  Service: Vascular;  Laterality: Left;   APPENDECTOMY     LOWER EXTREMITY ANGIOGRAPHY Left 01/11/2020   Procedure: Lower Extremity Angiography;  Surgeon: Annice Needy, MD;  Location: [facility] INVASIVE CV [facility];  Service: Cardiovascular;  Laterality: Left;   LOWER EXTREMITY ANGIOGRAPHY Left 03/06/2020   Procedure: Lower Extremity Angiography;  Surgeon: Annice Needy, MD;  Location: [facility] INVAS[facility] CV [facility];  Service: Cardiovascular;  Laterality: Left;   LOWER EXTREMITY ANGIOGRAPHY Left 03/07/2020   Procedure: Lower Extremity Angiography;  Surgeon: Annice Needy, MD;  Location: [facility] INVAS[facility] CV [facility];  Service: Cardiovascular;  Laterality: Left;   LOWER EXTREMITY ANGIOGRAPHY Left 01/06/2022   Procedure: Lower Extremity Angiography;  Surgeon: Annice Needy, MD;  Location: [facility] IN[facility] CV [facility];  Service: Cardiovascular;  Laterality: Left;   LOWER EXTREMITY ANGIOGRAPHY Left 01/07/2022   Procedure: Lower Extremity Angiography;  Surgeon: Annice Needy, MD;  Location: [facility] IN[facility] CV [facility];  Service: Cardiovascular;  Laterality: Left;   LOWER EXTREMITY ANGIOGRAPHY Left 01/18/2022   Procedure: Lower Extremity Angiography;  Surgeon: Annice Needy, MD;  Location: [facility] INV[facility];  Service: Cardiovascular;  Laterality: Left;   LOWER EXTREMITY INTERVENTION Bilateral 01/17/2022   Procedure: LOWER EXTREMITY INTERVENTION;  Surgeon: Learta Codding, MD;  Location: [facility]  INVASIVE CV LAB;  Service: Cardiovascular;  Laterality: Bilateral;   PERIPHERAL VASCULAR THROMBECTOMY Left 01/20/2022   Procedure: PERIPHERAL VASCULAR THROMBECTOMY Left Femoral vein;  Surgeon: Annice Needy, MD;  Location: [facility] ORS;  Service: Vascular;  Laterality: Left;   Social History:  reports that she has been smoking cigarettes. She has been smoking an average of .5 packs per day. She has never used smokeless tobacco. She reports current alcohol use. She reports that she does not use drugs.  Family / Support Systems Marital Status: Widow/Widower How Long?: 2 years Patient Roles: Parent Spouse/Significant Other: Widowed Children: 3 adult children Other Supports: None reported Anticipated Caregiver: [name] Ability/Limitations of Caregiver: None reported Caregiver Availability: 24/7 Family Dynamics: Pt lives in an apartment and her two daughters and their children live in the home (total of 8 people).  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 Education: some Charity fundraiser - How often do you need to have someone help you when you read instructions, pamphlets, or other written material from your doctor or pharmacy?: Never Writes: Yes Employment Status: Employed Return to Work Plans: Not able to return due to medical condition Marine scientist Issues: Denies Guardian/Conservator: N/Jocelyn   Abuse/Neglect Abuse/Neglect Assessment Can Be Completed: Yes Physical Abuse: Denies Verbal Abuse: Denies Sexual Abuse: Denies Exploitation of patient/patient's resources: Denies Self-Neglect: Denies  Patient response to: Social Isolation - How often do you feel lonely or isolated from those around you?: Never  Emotional Status Pt's affect, behavior and adjustment status: Pt tearful as she thinks about her current situation. She will not disclose any information. Recent Psychosocial Issues:  Denies Psychiatric History: Denies Substance Abuse History: admits that she smokes cigarettes and 1pk last 2 days. Admits she drinks beer, 2-3 cans of beer- 2-3xs per week. Denies rec drug use.  Patient / Family Perceptions, Expectations & Goals Pt/Family understanding of illness & functional limitations: Pt has general understanding of her care needs Premorbid pt/family roles/activities: Independent Anticipated changes in roles/activities/participation: Assistance with ADLs/IADLs Pt/family expectations/goals: Pt goal is "getting out of here."  [employer]: None Premorbid Home Care/DME Agencies: None Transportation available at discharge: TBD Is the patient able to respond to transportation needs?: Yes In the past 12 months, has lack of transportation kept you from medical appointments or from getting medications?: No In the past 12 months, has lack of transportation kept you from meetings, work, or from getting things needed for daily living?: No Resource referrals recommended: Neuropsychology  Discharge Planning Living Arrangements: Children, Other relatives Support Systems: Children, Other relatives Type of Residence: Private residence Insurance Resources: Customer service manager Resources: Employment Financial Screen Referred: No Living Expenses: Psychologist, sport and exercise Management: Patient Does the patient have any problems obtaining your medications?: No Home Management: Pt reports that [name] was prepaing all meals and housekeeping needs. Patient/Family Preliminary Plans: No changes Care Coordinator Barriers to Discharge: Inaccessible home environment, Home environment access/layout, Insurance for SNF coverage Care Coordinator Barriers to Discharge Comments: Pt is uninsured. Care Coordinator Anticipated Follow Up Needs: HH/OP  Clinical Impression Pt is not [employer]ocelyn veteran. No HCPOA and declined to complete. DME: RW. Reports working towards  getting Jocelyn lower  level unit.   Jocelyn [name] Jocelyn [name] 01/26/2022, 2:55 PM

## 2022-01-26 NOTE — Progress Notes (Signed)
Inpatient Rehabilitation  Patient information reviewed and entered into eRehab system by Shasta Chinn M. Halima Fogal, M.A., CCC/SLP, PPS Coordinator.  Information including medical coding, functional ability and quality indicators will be reviewed and updated through discharge.    

## 2022-01-26 NOTE — Discharge Summary (Signed)
Physician Discharge Summary  Patient ID: Jocelyn [name] MRN: [id number] DOB/AGE: 43-Dec-1982 43 y.o.  Admit date: 01/25/2022 Discharge date: 02/02/2022  Discharge Diagnoses:  Principal Problem:   S/P AKA (above knee amputation) unilateral, left (HCC) Active problems: Debility secondary to left AKA PAD Iron deficiency anemia Tobacco use DM-2 Hypertension   Discharged Condition: stable  Significant Diagnostic Studies:  Labs:  Basic Metabolic Panel: Recent Labs  Lab 01/29/22 0605 02/01/22 0807  NA 142 139  K 3.8 3.7  CL 103 105  CO2 26 24  GLUCOSE 110* 128*  BUN 8 13  CREATININE 0.53 0.66  CALCIUM 10.3 10.3    CBC: Recent Labs  Lab 02/01/22 0807  WBC 6.9  HGB 10.9*  HCT 36.5  MCV 83.3  PLT 514*    CBG: Recent Labs  Lab 01/27/22 1154 01/27/22 1641 01/27/22 2021 01/28/22 0620 01/28/22 1138  GLUCAP 102* 107* 119* 112* 107*    Brief HPI:   Jocelyn [name] is a 41 y.o. female presented to [facility] on 01/17/2022 with ischemic LLE pain and gangrenous changes to left foot. Underwent arteriogram with findings of occlusion of the left SFA, popliteal artery and all three tibial vessels. She was found to have non-reconstructable arterial insufficiency as well as massive left LE DVT and critical limb ischemia. She was agreeable to undergo left AKA with left femoral vein thrombectomy on 01/20/2022 and eliquis resumed. She has had issues with sinus tachycardia and EKG was negative for ST changes.  ABLA treated with 3 units PRBC and transient urinary retention has resolved. PT/OT has been working with patient who continued to be limited by pain, weakness as well as balance deficits. CIR was recommended due to functional decline.     Hospital Course: Jocelyn [name] was admitted to rehab 01/25/2022 for inpatient therapies to consist of PT, ST and OT at least three hours five days a week. Past admission physiatrist, therapy team and rehab RN have worked together to provide customized  collaborative inpatient rehab. Phantom pain an issue and Neurontin increased to 400 mg then to 600 mg TID on 7/19. Hypokalemia repleted and protein supplements given for low serum albumin. Hemoglobin stable at 9.2. Gabapentin was titrated to 600 mg TID to help manage phantom pain and oxycodone has been used on TID prn basis at discharge.   She continues to have mild serous drainage from incision which is intact   Her blood pressures were monitored on TID basis and Norvasc 5 mg and HCTZ 25 mg continued and is showing improvement in control overall. Follow up labs revealed hypokalemia likely due to HCTZ and was supplemented with Kdur with good results. Follow up CBC showed ABLA is improving. Insomnia has been managed with prn use of trazodone. She has refused use of nicotine patch during her stay. Her diabetes has been monitored with ac/hs CBG checks and SSI was use prn for tighter BS control. BS are controlled on diet alone and CBG checks were discontinued on 07/20.    Rehab course: During patient's stay in rehab weekly team conferences were held to monitor patient's progress, set goals and discuss barriers to discharge. At admission, patient required min assist/contact guard/touching for mobility and transfers as well as for ADLs. She  has had improvement in activity tolerance, balance, postural control as well as ability to compensate for deficits.  She is able to complete ADL tasks at modified independent level. She is independent for transfers and is able to ambulate 150' with RW and hop  to gait pattern. She is able to climb 4 stairs with supervision and verbal cues.     Disposition: Home  Diet: Carb Modified.   Special Instructions:  No driving, alcohol consumption or tobacco use.   Discharge Instructions     Ambulatory referral to Physical Medicine Rehab   Complete by: As directed    Hospital follow-up   Discharge patient   Complete by: As directed    Discharge disposition: 01-Home or  Self Care   Discharge patient date: 02/02/2022     30-35 minutes were spent on discharge planning and discharge summary.  Allergies as of 02/02/2022   No Known Allergies      Medication List     STOP taking these medications    docusate sodium 100 MG capsule Commonly known as: COLACE   gabapentin 300 MG capsule Commonly known as: NEURONTIN Replaced by: gabapentin 600 MG tablet   HYDROcodone-acetaminophen 5-325 MG tablet Commonly known as: NORCO/VICODIN   insulin aspart 100 UNIT/ML injection Commonly known as: novoLOG   polyethylene glycol 17 g packet Commonly known as: MIRALAX / GLYCOLAX       TAKE these medications    acetaminophen 325 MG tablet Commonly known as: TYLENOL Take 1-2 tablets (325-650 mg total) by mouth every 4 (four) hours as needed for mild pain.   amLODipine 10 MG tablet Commonly known as: NORVASC Take 1 tablet (10 mg total) by mouth daily.   apixaban 5 MG Tabs tablet Commonly known as: ELIQUIS Take 1 tablet (5 mg total) by mouth 2 (two) times daily.   gabapentin 600 MG tablet Commonly known as: NEURONTIN Take 1 tablet (600 mg total) by mouth 3 (three) times daily. Replaces: gabapentin 300 MG capsule   hydrochlorothiazide 25 MG tablet Commonly known as: HYDRODIURIL Take 1 tablet (25 mg total) by mouth daily.   Iron 325 (65 Fe) MG Tabs Take 1 tablet (325 mg total) by mouth daily.   methocarbamol 500 MG tablet Commonly known as: ROBAXIN Take 1 tablet (500 mg total) by mouth every 6 (six) hours as needed for muscle spasms.   nicotine 21 mg/24hr patch Commonly known as: NICODERM CQ - dosed in mg/24 hours Place 1 patch (21 mg total) onto the skin daily.   oxyCODONE 5 MG immediate release tablet Commonly known as: Oxy IR/ROXICODONE Take 1 tablet (5 mg total) by mouth every 6 (six) hours as needed for severe pain.   potassium chloride SA 20 MEQ tablet Commonly known as: KLOR-CON M Take 1 tablet (20 mEq total) by mouth daily.    traZODone 50 MG tablet Commonly known as: DESYREL Take 0.5-1 tablets (25-50 mg total) by mouth at bedtime as needed for sleep.        Follow-up Information     [facility] Call.   Why: Call to make arrangements for hospital follow-up appointment Contact information: [address] [place] [address] [phone]         Annice Needy, MD Follow up.   Specialties: Vascular Surgery, Radiology, Interventional Cardiology Why: 02/05/2022--appointment at 2:30 pm (be there by 2:15 for paperwork) Contact information: [address] [place] [address] [phone]         Genice Rouge, MD Follow up.   Specialty: Physical Medicine and Rehabilitation Why: office will call you to arrange your appt (sent) Contact information: [address] [place] [address] [phone]                 Signed:  Lynnell Jude Setzer 02/02/2022, 8:54 AM

## 2022-01-26 NOTE — Plan of Care (Signed)
  Problem: RH Balance Goal: LTG Patient will maintain dynamic standing balance (PT) Description: LTG:  Patient will maintain dynamic standing balance with assistance during mobility activities (PT) Flowsheets (Taken 01/26/2022 1917) LTG: Pt will maintain dynamic standing balance during mobility activities with:: Supervision/Verbal cueing   Problem: Sit to Stand Goal: LTG:  Patient will perform sit to stand with assistance level (PT) Description: LTG:  Patient will perform sit to stand with assistance level (PT) Flowsheets (Taken 01/26/2022 1917) LTG: PT will perform sit to stand in preparation for functional mobility with assistance level: Independent with assistive device   Problem: RH Bed Mobility Goal: LTG Patient will perform bed mobility with assist (PT) Description: LTG: Patient will perform bed mobility with assistance, with/without cues (PT). Flowsheets (Taken 01/26/2022 1917) LTG: Pt will perform bed mobility with assistance level of: Independent with assistive device    Problem: RH Bed to Chair Transfers Goal: LTG Patient will perform bed/chair transfers w/assist (PT) Description: LTG: Patient will perform bed to chair transfers with assistance (PT). Flowsheets (Taken 01/26/2022 1917) LTG: Pt will perform Bed to Chair Transfers with assistance level: Independent with assistive device    Problem: RH Car Transfers Goal: LTG Patient will perform car transfers with assist (PT) Description: LTG: Patient will perform car transfers with assistance (PT). Flowsheets (Taken 01/26/2022 1917) LTG: Pt will perform car transfers with assist:: Supervision/Verbal cueing   Problem: RH Furniture Transfers Goal: LTG Patient will perform furniture transfers w/assist (OT/PT) Description: LTG: Patient will perform furniture transfers  with assistance (OT/PT). Flowsheets (Taken 01/26/2022 1917) LTG: Pt will perform furniture transfers with assist:: Independent with assistive device    Problem: RH  Ambulation Goal: LTG Patient will ambulate in controlled environment (PT) Description: LTG: Patient will ambulate in a controlled environment, # of feet with assistance (PT). Flowsheets (Taken 01/26/2022 1917) LTG: Pt will ambulate in controlled environ  assist needed:: Supervision/Verbal cueing LTG: Ambulation distance in controlled environment: 75 feet using LRAD Goal: LTG Patient will ambulate in home environment (PT) Description: LTG: Patient will ambulate in home environment, # of feet with assistance (PT). Flowsheets (Taken 01/26/2022 1917) LTG: Pt will ambulate in home environ  assist needed:: Supervision/Verbal cueing LTG: Ambulation distance in home environment: up to 50 ft using LRAD   Problem: RH Wheelchair Mobility Goal: LTG Patient will propel w/c in controlled environment (PT) Description: LTG: Patient will propel wheelchair in controlled environment, # of feet with assist (PT) Flowsheets (Taken 01/26/2022 1917) LTG: Pt will propel w/c in controlled environ  assist needed:: Independent with assistive device LTG: Propel w/c distance in controlled environment: at least 150 feet Goal: LTG Patient will propel w/c in home environment (PT) Description: LTG: Patient will propel wheelchair in home environment, # of feet with assistance (PT). Flowsheets (Taken 01/26/2022 1917) Distance: wheelchair distance in controlled environment: 150 LTG: Propel w/c distance in home environment: up to 50 feet including maneuvering all obstacles   Problem: RH Stairs Goal: LTG Patient will ambulate up and down stairs w/assist (PT) Description: LTG: Patient will ambulate up and down # of stairs with assistance (PT) Flowsheets (Taken 01/26/2022 1917) LTG: Pt will ambulate up/down stairs assist needed:: Contact Guard/Touching assist LTG: Pt will  ambulate up and down number of stairs: 11 steps using AD as necessary and at least one HR

## 2022-01-27 LAB — GLUCOSE, CAPILLARY
Glucose-Capillary: 98 mg/dL (ref 70–99)
Glucose-Capillary: 102 mg/dL — ABNORMAL HIGH (ref 70–99)
Glucose-Capillary: 107 mg/dL — ABNORMAL HIGH (ref 70–99)
Glucose-Capillary: 119 mg/dL — ABNORMAL HIGH (ref 70–99)

## 2022-01-27 MED ORDER — GABAPENTIN 600 MG PO TABS
600.0000 mg | ORAL_TABLET | Freq: Three times a day (TID) | ORAL | Status: DC
  Administered 2022-01-27 – 2022-02-02 (×18): 600 mg via ORAL
  Filled 2022-01-27 (×19): qty 1

## 2022-01-27 MED ORDER — AMLODIPINE BESYLATE 5 MG PO TABS
5.0000 mg | ORAL_TABLET | Freq: Every day | ORAL | Status: DC
  Administered 2022-01-27 – 2022-01-29 (×3): 5 mg via ORAL
  Filled 2022-01-27 (×3): qty 1

## 2022-01-27 MED ORDER — OXYCODONE HCL 5 MG PO TABS
5.0000 mg | ORAL_TABLET | Freq: Four times a day (QID) | ORAL | Status: DC | PRN
  Administered 2022-01-27 – 2022-02-02 (×21): 5 mg via ORAL
  Filled 2022-01-27 (×22): qty 1

## 2022-01-27 NOTE — Care Management (Signed)
[facility] Individual Statement of Services  Patient Name:  Jocelyn [name]  Date:  01/27/2022  Welcome to the [facility].  Our goal is to provide you with an individualized program based on your diagnosis and situation, designed to meet your specific needs.  With this comprehensive rehabilitation program, you will be expected to participate in at least 3 hours of rehabilitation therapies Monday-Friday, with modified therapy programming on the weekends.  Your rehabilitation program will include the following services:  Physical Therapy (PT), Occupational Therapy (OT), 24 hour per day rehabilitation nursing, Therapeutic Recreaction (TR), Psychology, Neuropsychology, Care Coordinator, Rehabilitation Medicine, Nutrition Services, Pharmacy Services, and Other  Weekly team conferences will be held on Tuesdays to discuss your progress.  Your Inpatient Rehabilitation Care Coordinator will talk with you frequently to get your input and to update you on team discussions.  Team conferences with you and your family in attendance may also be held.  Expected length of stay: 7-10 days    Overall anticipated outcome: Independent with an Assistive Device  Depending on your progress and recovery, your program may change. Your Inpatient Rehabilitation Care Coordinator will coordinate services and will keep you informed of any changes. Your Inpatient Rehabilitation Care Coordinator's name and contact numbers are listed  below.  The following services may also be recommended but are not provided by the Inpatient Rehabilitation [facility]:  Driving Evaluations Home Health Rehabiltiation Services Outpatient Rehabilitation Services Vocational Rehabilitation   Arrangements will be made to provide these services after discharge if needed.  Arrangements include referral to agencies that provide these services.  Your insurance has been verified to be:  Uninsured  Your primary  doctor is:  [facility]  Pertinent information will be shared with your doctor and your insurance company.  Inpatient Rehabilitation Care Coordinator:  Susie Cassette [phone] or ([phone]  Information discussed with and copy given to patient by: Gretchen Short, 01/27/2022, 9:32 AM

## 2022-01-27 NOTE — Progress Notes (Signed)
Occupational Therapy Session Note  Patient Details  Name: Jocelyn [name] MRN: [id number] Date of Birth: July 24, 1980  Today's Date: 01/27/2022 OT Individual Time: 4098-1191 OT Individual Time Calculation (min): 70 min    Short Term Goals: Week 1:  OT Short Term Goal 1 (Week 1): STG = LTG  Skilled Therapeutic Interventions/Progress Updates:    OT intervention with focus on sit<>stand, w/c mobility, standing balance, discharge planning, and safety awareness to increase independence with BADLs. Pt practiced sit<>stand from 20" with supervision and min verbal cues for sequencing. Pt states 20" "feels like" her bed height at home. Pt pleased with success. Pt also practiced ascendng/descending ramp with CGA. STanding balance tasks placing clothes pins on basketball net with RUE and removing with LUE. No LOB observed. Pt propelled w/c back to room and remained in w/c with all needs within reach. Belt alarm activated.   Therapy Documentation Precautions:  Precautions Precautions: Fall Precaution Comments: L AKA Required Braces or Orthoses: Other Brace Other Brace: retention sock to be donned 24/7 except for wound care and dressing/ bathing; shrinker to be used after staple removal Restrictions Weight Bearing Restrictions: Yes LLE Weight Bearing: Non weight bearing Other Position/Activity Restrictions: LLE  Pain: Pain Assessment Pain Scale: 0-10 Pain Score: 6  Pain Type: Acute pain Pain Location: Leg Pain Orientation: Left Pain Descriptors / Indicators: Sharp Pain Frequency: Constant Pain Onset: On-going Pain Intervention(s): premedicated, self massage   Therapy/Group: Individual Therapy  Rich Brave 01/27/2022, 11:57 AM

## 2022-01-27 NOTE — Progress Notes (Signed)
Physical Therapy Session Note  Patient Details  Name: Jocelyn [name] MRN: [id number] Date of Birth: 16-Jan-1981  Today's Date: 01/27/2022 PT Individual Time: 0900-1000 PT Individual Time Calculation (min): 60 min   Short Term Goals: Week 1:  PT Short Term Goal 1 (Week 1): STG = LTG d/t ELOS  Skilled Therapeutic Interventions/Progress Updates:    Pt seated in w/c on arrival and agreeable to therapy. Pt reports high pain levels during session, but pushes through, premedicated. Rest and positioning provided as needed. Pt propelled w/c with BUE to therapy gym with supervision. Pt able to don/doff leg rests with supervision and manage w/c brakes without cueing. Stand pivot transfer to mat table with CGA and RW. Pt then directed in Sit to stand x 10, but reports shooting pain in her residual limb every time she stands up. Transitioned to squats with RW 3 x 8 with cueing for technique. Pt then reports LE fatigue. Shifted to using BITS for UE and reaching to challenge balance and single leg stance stability. Pt performed bell cancellation task with RUE first (1:48). Pt initially resistant to using LUE but performed with CGA (2:00). Pt then performed trail making task, alternating hands x 2, both trials about 2 min. Pt performed all activities in standing with Cga-supervision with 1 small LOB requiring light min A to correct. Pt then propelled chair back to room. Pt requested to use bathroom, ambulatory transfer to commode. Distant supervision for 3/3 toileting tasks. Pt returned to bed after session with CGA and was left with all needs in reach and alarm active.   Therapy Documentation Precautions:  Precautions Precautions: Fall Precaution Comments: L AKA Required Braces or Orthoses: Other Brace Other Brace: retention sock to be donned 24/7 except for wound care and dressing/ bathing; shrinker to be used after staple removal Restrictions Weight Bearing Restrictions: Yes LLE Weight Bearing: Non weight  bearing Other Position/Activity Restrictions: LLE General:      Therapy/Group: Individual Therapy  Juluis Rainier 01/27/2022, 3:33 PM

## 2022-01-27 NOTE — Progress Notes (Signed)
Occupational Therapy Session Note  Patient Details  Name: Jocelyn [name] MRN: [id number] Date of Birth: 12/28/1980  Today's Date: 01/27/2022 OT Individual Time: 0700-0810 OT Individual Time Calculation (min): 70 min    Short Term Goals: Week 1:  OT Short Term Goal 1 (Week 1): STG = LTG  Skilled Therapeutic Interventions/Progress Updates:    Pt resting in bed upon arrival. OT intervention with focus on bed mobility, squat pivot transfers, stand pivot tranfsers, BADL training, TTB tranfsers, activity tolerance, and safety awareness to increase independence with BADLs. Supine>sit EOB with supervision. Squat pivot transfer to w/c with supervision. Pt completed bathing/dressing with sit<>stand from w/c at sink with supervision. Pt currently wearing hospital gowns 2/2 LLE edema preventing donning pants. Standing balance at sink with UE support with supervision while bathing buttocks. Pt propelled w/c to tub room and practiced TTB transfers with CGA for sit<>stand. Pt practiced sit<>stand from 22" and 21" with CGA. Pt concerned she will not be able to stand from bed at home. Pt states she has sleigh bed. Pt returned to room and remained in w/c with all needs within reach. Belt alarm activated.   Therapy Documentation Precautions:  Precautions Precautions: Fall Precaution Comments: L AKA Required Braces or Orthoses: Other Brace Other Brace: retention sock to be donned 24/7 except for wound care and dressing/ bathing; shrinker to be used after staple removal Restrictions Weight Bearing Restrictions: Yes LLE Weight Bearing: Non weight bearing Other Position/Activity Restrictions: LLE  Pain: Pt reports increased LLE phantom pain and incision pain with transitional movements; repositioned, self massage, emotional support   Therapy/Group: Individual Therapy  Rich Brave 01/27/2022, 8:17 AM

## 2022-01-27 NOTE — Progress Notes (Addendum)
PROGRESS NOTE   Subjective/Complaints: Having a lot of phantom pain in left leg. Can't keep up with "prn" pain meds. Incisional pain not that bad  ROS: Patient denies fever, rash, sore throat, blurred vision, dizziness, nausea, vomiting, diarrhea, cough, shortness of breath or chest pain,   back/neck pain, headache, or mood change.   Objective:   No results found. Recent Labs    01/25/22 0537 01/26/22 0536  WBC 4.8 5.3  HGB 9.3* 9.2*  HCT 31.7* 29.8*  PLT 474* 466*   Recent Labs    01/25/22 0537 01/26/22 0536  NA 144 141  K 3.7 3.2*  CL 112* 106  CO2 26 29  GLUCOSE 101* 101*  BUN 9 6  CREATININE 0.44 0.54  CALCIUM 9.1 9.2    Intake/Output Summary (Last 24 hours) at 01/27/2022 0840 Last data filed at 01/26/2022 2102 Gross per 24 hour  Intake 537 ml  Output --  Net 537 ml        Physical Exam: Vital Signs Blood pressure (!) 163/95, pulse 96, temperature 98 F (36.7 C), temperature source Oral, resp. rate 15, height 5\' 5"  (1.651 m), weight 93.6 kg, last menstrual period 01/05/2022, SpO2 98 %.  Constitutional: No distress . Vital signs reviewed. obese HEENT: NCAT, EOMI, oral membranes moist Neck: supple Cardiovascular: RRR without murmur. No JVD    Respiratory/Chest: CTA Bilaterally without wheezes or rales. Normal effort    GI/Abdomen: BS +, non-tender, non-distended Ext: no clubbing, cyanosis, or edema Psych: pleasant and cooperative  Skin: left AK incision dressed, no drainage visible Neuro:  Alert and oriented x 3. Normal insight and awareness. Intact Memory. Normal language and speech. Cranial nerve exam unremarkable  UE motor 5/5. LLE limited by pain. RLE 3-4/5.  Musculoskeletal: left leg still quite edematous and tender to palpation    Assessment/Plan: 1. Functional deficits which require 3+ hours per day of interdisciplinary therapy in a comprehensive inpatient rehab setting. Physiatrist is  providing close team supervision and 24 hour management of active medical problems listed below. Physiatrist and rehab team continue to assess barriers to discharge/monitor patient progress toward functional and medical goals  Care Tool:  Bathing    Body parts bathed by patient: Right arm, Left arm, Right lower leg, Chest, Abdomen, Front perineal area, Face, Buttocks, Right upper leg   Body parts bathed by helper:  (lower back and bottom) Body parts n/a: Right lower leg   Bathing assist Assist Level: Supervision/Verbal cueing     Upper Body Dressing/Undressing Upper body dressing   What is the patient wearing?: Hospital gown only    Upper body assist Assist Level: Supervision/Verbal cueing    Lower Body Dressing/Undressing Lower body dressing      What is the patient wearing?: Hospital gown only     Lower body assist Assist for lower body dressing: Supervision/Verbal cueing     Toileting Toileting    Toileting assist Assist for toileting: Minimal Assistance - Patient > 75%     Transfers Chair/bed transfer  Transfers assist  Chair/bed transfer activity did not occur: Safety/medical concerns  Chair/bed transfer assist level: Minimal Assistance - Patient > 75%     Locomotion Ambulation  Ambulation assist      Assist level: Contact Guard/Touching assist Assistive device: Walker-rolling Max distance: 30 ft   Walk 10 feet activity   Assist     Assist level: Contact Guard/Touching assist Assistive device: Walker-rolling   Walk 50 feet activity   Assist Walk 50 feet with 2 turns activity did not occur: Safety/medical concerns         Walk 150 feet activity   Assist Walk 150 feet activity did not occur: Safety/medical concerns         Walk 10 feet on uneven surface  activity   Assist Walk 10 feet on uneven surfaces activity did not occur: Safety/medical concerns         Wheelchair     Assist Is the patient using a  wheelchair?: Yes Type of Wheelchair: Manual    Wheelchair assist level: Contact Guard/Touching assist Max wheelchair distance: 50 ft    Wheelchair 50 feet with 2 turns activity    Assist        Assist Level: Contact Guard/Touching assist   Wheelchair 150 feet activity     Assist      Assist Level: Total Assistance - Patient < 25%   Blood pressure (!) 163/95, pulse 96, temperature 98 F (36.7 C), temperature source Oral, resp. rate 15, height 5\' 5"  (1.651 m), weight 93.6 kg, last menstrual period 01/05/2022, SpO2 98 %.  Medical Problem List and Plan: 1. 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  Antithrombotics: -DVT/anticoagulation:  Pharmaceutical: Eliquis             -antiplatelet therapy: N/A 3. Pain Management: Neurontin  increased to 400mg  TID for phantom pain Monday. Will increase to 600mg  tid 7/19 given ongoing pain complaints. -hydrocodone as needed 4. Mood/Behavior/Sleep: Provide emotional support             -antipsychotic agents: N/A 5. Neuropsych/cognition: This patient is capable of making decisions on her own behalf. 6. Skin/Wound Care: continue dry dressing to stump. Order stump sock with waist belt 7. Fluids/Electrolytes/Nutrition:  encourage PO  -Potassium 3.2-->  supplementing  -protein supp for low albumin 8.  Iron deficiency anemia.  Continue iron supplement.     -hgb 9.2 7/18 9.  Tobacco abuse.  NicoDerm patch.  Provide counseling 10.  Type II diabetes mellitus.  Latest hemoglobin A1c 5.1.  SSI.  CBG (last 3)  Recent Labs    01/26/22 1747 01/26/22 2106 01/27/22 0640  GLUCAP 161* 109* 98    11.  Hypertension.  Norvasc 5 mg and HCTZ 25 mg daily.  Monitor with increased mobility  7/19 remains elevated. Pain component.   -amlodipine never  started. Begin 5mg  daily today     LOS: 2 days A FACE TO FACE EVALUATION WAS PERFORMED  01/28/22 01/27/2022, 8:40 AM

## 2022-01-27 NOTE — Plan of Care (Signed)
  Problem: RH SKIN INTEGRITY Goal: RH STG MAINTAIN SKIN INTEGRITY WITH ASSISTANCE Description: STG Maintain Skin Integrity With min Assistance. Outcome: Progressing   Problem: RH SAFETY Goal: RH STG ADHERE TO SAFETY PRECAUTIONS W/ASSISTANCE/DEVICE Description: STG Adhere to Safety Precautions With min Assistance/Device. Outcome: Progressing   

## 2022-01-27 NOTE — Progress Notes (Signed)
Patient ID: Jocelyn [name], female   DOB: Jun 27, 1981, 41 y.o.   MRN: [id number]  SW spoke with pt to discuss ELOS and family education. Possibly on Monday 9am-12pm with her mother and dtr. SW will confirm.   1416-SW spoke with pt dtr Gavin Pound to inform on above.   Cecile Sheerer, MSW, LCSWA Office: [phone] Cell: [phone] Fax: [phone]

## 2022-01-28 ENCOUNTER — Other Ambulatory Visit (HOSPITAL_COMMUNITY): Payer: Self-pay

## 2022-01-28 LAB — GLUCOSE, CAPILLARY
Glucose-Capillary: 112 mg/dL — ABNORMAL HIGH (ref 70–99)
Glucose-Capillary: 107 mg/dL — ABNORMAL HIGH (ref 70–99)

## 2022-01-28 LAB — SARS CORONAVIRUS 2 BY RT PCR: SARS Coronavirus 2 by RT PCR: NEGATIVE

## 2022-01-28 NOTE — IPOC Note (Signed)
Overall Plan of Care [facility]) Patient Details Name: Jocelyn [name] MRN: [id number] DOB: 11/23/1980  Admitting Diagnosis: S/P AKA (above knee amputation) unilateral, left [facility])  Hospital Problems: Principal Problem:   S/P AKA (above knee amputation) unilateral, left (HCC)     Functional Problem List: Nursing Edema, Endurance, Medication Management, Nutrition, Pain, Perception, Safety, Sensory, Skin Integrity  PT Balance, Edema, Endurance, Motor, Nutrition, Pain, Perception, Safety, Sensory, Skin Integrity  OT Balance, Safety, Sensory, Edema, Skin Integrity, Endurance, Motor, Pain  SLP    TR         Basic ADL's: OT Bathing, Dressing, Toileting     Advanced  ADL's: OT       Transfers: PT Bed Mobility, Bed to Chair, Car, Occupational psychologist, Research scientist (life sciences): PT Ambulation, Psychologist, prison and probation services, Stairs     Additional Impairments: OT None  SLP        TR      Anticipated Outcomes Item Anticipated Outcome  Self Feeding no goal set  Swallowing      Basic self-care  S  Toileting  S   Bathroom Transfers S  Bowel/Bladder  pt to remain continent x 2  Transfers  Mod I  Locomotion  supervision  Communication     Cognition     Pain  less than 3  Safety/Judgment      Therapy Plan: PT Intensity: Minimum of 1-2 x/day ,45 to 90 minutes PT Frequency: 5 out of 7 days PT Duration Estimated Length of Stay: 7-10 days OT Intensity: Minimum of 1-2 x/day, 45 to 90 minutes OT Frequency: 5 out of 7 days OT Duration/Estimated Length of Stay: 7 days     Team Interventions: Nursing Interventions Patient/Family Education, Disease Management/Prevention, Skin Care/Wound Management, Discharge Planning, Pain Management, Psychosocial Support, Medication Management  PT interventions Ambulation/gait training, Community reintegration, DME/adaptive equipment instruction, Neuromuscular re-education, Psychosocial support, Stair training, UE/LE Strength taining/ROM, Wheelchair  propulsion/positioning, Warden/ranger, Discharge planning, Functional electrical stimulation, Pain management, Skin care/wound management, Therapeutic Activities, UE/LE Coordination activities, Cognitive remediation/compensation, Disease management/prevention, Functional mobility training, Patient/family education, Therapeutic Exercise  OT Interventions Balance/vestibular training, Discharge planning, Pain management, Self Care/advanced ADL retraining, Therapeutic Activities, UE/LE Coordination activities, Community reintegration, Fish farm manager, Neuromuscular re-education, Psychosocial support, Skin care/wound managment, UE/LE Strength taining/ROM, Wheelchair propulsion/positioning, Therapeutic Exercise, Patient/family education, Functional mobility training  SLP Interventions    TR Interventions    SW/CM Interventions Discharge Planning, Psychosocial Support, Patient/Family Education   Barriers to Discharge MD  Medical stability  Nursing Home environment access/layout, Decreased caregiver support, Wound Care, Weight bearing restrictions    PT Inaccessible home environment, Decreased caregiver support, Home environment access/layout, Wound Care, Insurance for SNF coverage, Weight, Weight bearing restrictions, Medication compliance, Nutrition means    OT Inaccessible home environment, Home environment access/layout, Wound Care, Weight bearing restrictions    SLP      SW Inaccessible home environment, Home environment Best boy, Insurance for SNF coverage Pt is uninsured.   Team Discharge Planning: Destination: PT-Home ,OT- Home , SLP-  Projected Follow-up: PT-Home health PT, Outpatient PT, 24 hour supervision/assistance, OT-  Outpatient OT, SLP-  Projected Equipment Needs: PT-Rolling walker with 5" wheels, Wheelchair (measurements), Wheelchair cushion (measurements), To be determined, OT- To be determined, SLP-  Equipment Details: PT- , OT-   Patient/family involved in discharge planning: PT- Patient,  OT-Patient, SLP-   MD ELOS: 7 days Medical Rehab Prognosis:  Excellent Assessment: The patient has been admitted for CIR therapies with the diagnosis of L  AKA d/t PAD. The team will be addressing functional mobility, strength, stamina, balance, safety, adaptive techniques and equipment, self-care, bowel and bladder mgt, patient and caregiver education, pain mgt pre-prosthetic education, community reentry, coping skills. Goals have been set at supervision to mod I at w/c level for mobility and self-care. Anticipated discharge destination is home.        See Team Conference Notes for weekly updates to the plan of care

## 2022-01-28 NOTE — Progress Notes (Signed)
Patient ID: Jocelyn [name], female   DOB: 12/07/1980, 41 y.o.   MRN: [id number]  Per pharmacy, pt in program at [facility], and she should  continue to use normal pharmacy versus using [facility] pharmacy at discharge (efforts to sign up pt for [facility] medication assistance program.  Cecile Sheerer, MSW, LCSWA Office: [phone] Cell: [phone] Fax: [phone]

## 2022-01-28 NOTE — Progress Notes (Signed)
Occupational Therapy Session Note  Patient Details  Name: Jocelyn [name] MRN: [id number] Date of Birth: 07/08/1981  Today's Date: 01/28/2022 OT Individual Time: 7371-0626 OT Individual Time Calculation (min): 70 min    Short Term Goals: Week 1:  OT Short Term Goal 1 (Week 1): STG = LTG  Skilled Therapeutic Interventions/Progress Updates:    OT intervention with focus on functional amb with RW, sit<>stand from various surfaces, standing balance, w/c mobility, activity tolerance, and safety awareness to increase independence with BADLs. W/c mobility in halls and gym with supervision. Sit<>stand from w/c and EOM at lowest height with supervision. Pt required mod A for sit<>stand from couch in ADL apartment. Pt completed half squats from EOB 3x5 with CGA. Pt reports increased pain with activity. Pt engaged in Wii bowling competing against OTA. Pt stood for entire game with close supervision. Pt propelled w/c to room and remained in w/c with all needs withiun reach. Belt alarm activated.   Therapy Documentation Precautions:  Precautions Precautions: Fall Precaution Comments: L AKA Required Braces or Orthoses: Other Brace Other Brace: retention sock to be donned 24/7 except for wound care and dressing/ bathing; shrinker to be used after staple removal Restrictions Weight Bearing Restrictions: Yes LLE Weight Bearing: Non weight bearing Other Position/Activity Restrictions: LLE  Pain: Pt initially stated that her pain was "ok" but c/o increased pain near later part of session; RN notified and meds requested  Therapy/Group: Individual Therapy  Rich Brave 01/28/2022, 12:05 PM

## 2022-01-28 NOTE — Progress Notes (Signed)
Occupational Therapy Session Note  Patient Details  Name: ALEYSSA PIKE MRN: [id number] Date of Birth: 17-Jul-1980  Today's Date: 01/28/2022 OT Individual Time: 0700-0811 OT Individual Time Calculation (min): 71 min    Short Term Goals: Week 1:  OT Short Term Goal 1 (Week 1): STG = LTG  Skilled Therapeutic Interventions/Progress Updates:    OT intervention with focus on bathing/dressing with sit<>stand from w/c at sink, functional transfers, standing balance, kitchen safety, w/c mobility, discharge planning, and safety awareness to increase independence with BADLs. All functional tranfsers and standing balance tasks with supervision. Pt completes bathing/dressing tasks with sit<>stand at supervision level. W/c mobility with supervision. Pt engaged in standing task tossing bean bags at target and standing at table to clean bean bags using BUE and leaning against table for balance support. Pt completed all tasks with supervision. Educated pt on Secondary school teacher. Pt issued walker bag. LTG upgraded to mod I after discussing with OTR (clinical specialist.) Pt returned to room and remained in w/c with all needs within reach. Belt alarm activated.   Therapy Documentation Precautions:  Precautions Precautions: Fall Precaution Comments: L AKA Required Braces or Orthoses: Other Brace Other Brace: retention sock to be donned 24/7 except for wound care and dressing/ bathing; shrinker to be used after staple removal Restrictions Weight Bearing Restrictions: Yes LLE Weight Bearing: Non weight bearing Other Position/Activity Restrictions: LLE Pain: Pt reports she received meds prior to breakfast and she is "ok for now."    Therapy/Group: Individual Therapy  Rich Brave 01/28/2022, 8:15 AM

## 2022-01-28 NOTE — Progress Notes (Addendum)
PROGRESS NOTE   Subjective/Complaints: Phantom limb pain better today with increased gabapentin and change to oxycodone. In good spirits. Was able to sleep  ROS: Patient denies fever, rash, sore throat, blurred vision, dizziness, nausea, vomiting, diarrhea, cough, shortness of breath or chest pain,  back/neck pain, headache, or mood change.    Objective:   No results found. Recent Labs    01/26/22 0536  WBC 5.3  HGB 9.2*  HCT 29.8*  PLT 466*   Recent Labs    01/26/22 0536  NA 141  K 3.2*  CL 106  CO2 29  GLUCOSE 101*  BUN 6  CREATININE 0.54  CALCIUM 9.2    Intake/Output Summary (Last 24 hours) at 01/28/2022 1146 Last data filed at 01/28/2022 0814 Gross per 24 hour  Intake 930 ml  Output --  Net 930 ml        Physical Exam: Vital Signs Blood pressure (!) 162/92, pulse (!) 103, temperature 98.8 F (37.1 C), resp. rate 18, height 5\' 5"  (1.651 m), weight 93.6 kg, last menstrual period 01/05/2022, SpO2 98 %.  Constitutional: No distress . Vital signs reviewed. Appears comfortable HEENT: NCAT, EOMI, oral membranes moist Neck: supple Cardiovascular: RRR without murmur. No JVD    Respiratory/Chest: CTA Bilaterally without wheezes or rales. Normal effort    GI/Abdomen: BS +, non-tender, non-distended Ext: no clubbing, cyanosis, or edema Psych: pleasant and cooperative  Skin: left AK incision dressed---did not remove dressing today Neuro:  Alert and oriented x 3. Normal insight and awareness. Intact Memory. Normal language and speech. Cranial nerve exam unremarkable  UE motor 5/5. LLE limited by pain. RLE 3-4/5.  Musculoskeletal: left leg remains edematous. Less tender to palpation    Assessment/Plan: 1. Functional deficits which require 3+ hours per day of interdisciplinary therapy in a comprehensive inpatient rehab setting. Physiatrist is providing close team supervision and 24 hour management of active  medical problems listed below. Physiatrist and rehab team continue to assess barriers to discharge/monitor patient progress toward functional and medical goals  Care Tool:  Bathing    Body parts bathed by patient: Right arm, Left arm, Right lower leg, Chest, Abdomen, Front perineal area, Face, Buttocks, Right upper leg   Body parts bathed by helper:  (lower back and bottom) Body parts n/a: Right lower leg   Bathing assist Assist Level: Supervision/Verbal cueing     Upper Body Dressing/Undressing Upper body dressing   What is the patient wearing?: Hospital gown only    Upper body assist Assist Level: Supervision/Verbal cueing    Lower Body Dressing/Undressing Lower body dressing      What is the patient wearing?: Hospital gown only     Lower body assist Assist for lower body dressing: Supervision/Verbal cueing     Toileting Toileting    Toileting assist Assist for toileting: Minimal Assistance - Patient > 75%     Transfers Chair/bed transfer  Transfers assist  Chair/bed transfer activity did not occur: Safety/medical concerns  Chair/bed transfer assist level: Minimal Assistance - Patient > 75%     Locomotion Ambulation   Ambulation assist      Assist level: Contact Guard/Touching assist Assistive device: Walker-rolling Max distance: 30  ft   Walk 10 feet activity   Assist     Assist level: Contact Guard/Touching assist Assistive device: Walker-rolling   Walk 50 feet activity   Assist Walk 50 feet with 2 turns activity did not occur: Safety/medical concerns         Walk 150 feet activity   Assist Walk 150 feet activity did not occur: Safety/medical concerns         Walk 10 feet on uneven surface  activity   Assist Walk 10 feet on uneven surfaces activity did not occur: Safety/medical concerns         Wheelchair     Assist Is the patient using a wheelchair?: Yes Type of Wheelchair: Manual    Wheelchair assist level:  Contact Guard/Touching assist Max wheelchair distance: 50 ft    Wheelchair 50 feet with 2 turns activity    Assist        Assist Level: Contact Guard/Touching assist   Wheelchair 150 feet activity     Assist      Assist Level: Total Assistance - Patient < 25%   Blood pressure (!) 162/92, pulse (!) 103, temperature 98.8 F (37.1 C), resp. rate 18, height 5\' 5"  (1.651 m), weight 93.6 kg, last menstrual period 01/05/2022, SpO2 98 %.  Medical Problem List and Plan: 1. 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  Antithrombotics: -DVT/anticoagulation:  Pharmaceutical: Eliquis             -antiplatelet therapy: N/A 3. Pain Management:  -continue gabapentin 600mg  tid for phantom limb pain. -using oxycodone prn to avoid excessive tylenol 4. Mood/Behavior/Sleep: Provide emotional support             -antipsychotic agents: N/A 5. Neuropsych/cognition: This patient is capable of making decisions on her own behalf. 6. Skin/Wound Care: continue dry dressing to stump. Order stump sock with waist belt 7. Fluids/Electrolytes/Nutrition:  encourage PO  -Potassium 3.2-->  supplementing  -protein supp for low albumin  -recheck labs 7/21 8.  Iron deficiency anemia.  Continue iron supplement.     -hgb 9.2 7/18 9.  Tobacco abuse.  NicoDerm patch.  Provide counseling 10.  Type II diabetes mellitus.  Latest hemoglobin A1c 5.1.  SSI.  CBG (last 3)  Recent Labs    01/27/22 2021 01/28/22 0620 01/28/22 1138  GLUCAP 119* 112* 107*    -sugars well controlled off meds. Dc cbg's/ssi 11.  Hypertension.  Norvasc 5 mg and HCTZ 25 mg daily.  Monitor with increased mobility  7/20 remains elevated. Pain component.   -amlodipine   5mg  daily resumed 7/19---observe     LOS: 3 days A FACE TO FACE  EVALUATION WAS PERFORMED  8/20 01/28/2022, 11:46 AM

## 2022-01-28 NOTE — Plan of Care (Signed)
  Problem: RH Balance Goal: LTG Patient will maintain dynamic standing with ADLs (OT) Description: LTG:  Patient will maintain dynamic standing balance with assist during activities of daily living (OT)  Flowsheets (Taken 01/28/2022 0812) LTG: Pt will maintain dynamic standing balance during ADLs with: (upgrade JLS) Independent with assistive device Note: upgrade JLS    Problem: Sit to Stand Goal: LTG:  Patient will perform sit to stand in prep for activites of daily living with assistance level (OT) Description: LTG:  Patient will perform sit to stand in prep for activites of daily living with assistance level (OT) Flowsheets (Taken 01/28/2022 0812) LTG: PT will perform sit to stand in prep for activites of daily living with assistance level: (upgrade JLS) Independent with assistive device Note: upgrade JLS    Problem: RH Bathing Goal: LTG Patient will bathe all body parts with assist levels (OT) Description: LTG: Patient will bathe all body parts with assist levels (OT) Flowsheets (Taken 01/28/2022 0812) LTG: Pt will perform bathing with assistance level/cueing: (upgrade JLS) Independent with assistive device  Note: upgrade JLS    Problem: RH Dressing Goal: LTG Patient will perform lower body dressing w/assist (OT) Description: LTG: Patient will perform lower body dressing with assist, with/without cues in positioning using equipment (OT) Flowsheets (Taken 01/28/2022 0812) LTG: Pt will perform lower body dressing with assistance level of: (upgrade JLS) Independent with assistive device Note: upgrade JLS    Problem: RH Toileting Goal: LTG Patient will perform toileting task (3/3 steps) with assistance level (OT) Description: LTG: Patient will perform toileting task (3/3 steps) with assistance level (OT)  Flowsheets (Taken 01/28/2022 0812) LTG: Pt will perform toileting task (3/3 steps) with assistance level: (upgrade JLS) Independent with assistive device Note: upgrade JLS    Problem:  RH Toilet Transfers Goal: LTG Patient will perform toilet transfers w/assist (OT) Description: LTG: Patient will perform toilet transfers with assist, with/without cues using equipment (OT) Flowsheets (Taken 01/28/2022 0812) LTG: Pt will perform toilet transfers with assistance level of: (upgrade JLS) Independent with assistive device Note: upgrade JLS    Problem: RH Awareness Goal: LTG: Patient will demonstrate awareness during functional activites type of (OT) Description: LTG: Patient will demonstrate awareness during functional activites type of (OT) Flowsheets (Taken 01/28/2022 0812) LTG: Patient will demonstrate awareness during functional activites type of (OT): (upgrade JLS) Independent Note: upgrade JLS    Problem: RH Furniture Transfers Goal: LTG Patient will perform furniture transfers w/assist (OT/PT) Description: LTG: Patient will perform furniture transfers  with assistance (OT/PT). Flowsheets (Taken 01/28/2022 0812) LTG: Pt will perform furniture transfers with assist:: (upgrade JLS) Independent with assistive device  Note: upgrade JLS

## 2022-01-28 NOTE — Discharge Instructions (Addendum)
Inpatient Rehab Discharge Instructions  Jocelyn [name] Discharge date and time: No discharge date for patient encounter.   Activities/Precautions/ Functional Status: Activity: no lifting, driving, or strenuous exercise until cleared by MD Diet: diabetic diet Wound Care: keep wound clean and dry Functional status:  ___ No restrictions     ___ Walk up steps independently ___ 24/7 supervision/assistance   ___ Walk up steps with assistance __x_ Intermittent supervision/assistance  ___ Bathe/dress independently ___ Walk with walker     ___ Bathe/dress with assistance ___ Walk Independently    ___ Shower independently ___ Walk with assistance    __x_ Shower with assistance ___x No alcohol     ___ Return to work/school ________  COMMUNITY REFERRALS UPON DISCHARGE:    No therapies until prosthesis. Be sure to discuss setting up outpatient physical and occupational therapies once appropriate with Outpatient Rehab Clinic, Vascular or primary care physician.*  Medical Equipment/Items Ordered: tub transfer bench and wheelchair                                                   Agency/Supplier: [employer] [phone] (charity)     Special Instructions:  No driving, alcohol consumption or tobacco use.   My questions have been answered and I understand these instructions. I will adhere to these goals and the provided educational materials after my discharge from the hospital.  Patient/Caregiver Signature _______________________________ Date __________  Clinician Signature _______________________________________ Date __________  Please bring this form and your medication list with you to all your follow-up doctor's appointments.

## 2022-01-28 NOTE — Progress Notes (Signed)
Physical Therapy Session Note  Patient Details  Name: Jocelyn [name] MRN: [id number] Date of Birth: [birth year]  Today's Date: 01/28/2022 PT Individual Time: 1557-1701 PT Individual Time Calculation (min): 64 min   Short Term Goals: Week 1:  PT Short Term Goal 1 (Week 1): STG = LTG d/t ELOS  Skilled Therapeutic Interventions/Progress Updates:    Pt received long sitting in bed with nurse present to perform dressing change. Pt agreeable to therapy session. Pt donned retention sock without assist but education on using strap around her waist because pt thought it wrapped around her residual limb.Transitioned to sitting EOB mod-I. Sit<>stands using RW with supervision throughout session. Gait in/out bathroom using RW with supervision and good R LE foot clearance and no instances of instability. Continent of bladder and performed peri-care without assist.   Discussed home entrance with pt confirming she has 5 steps + landing + 6 steps to get into her apartment with 1 HR on each set of steps. Discussed attempting shower chair technique but educated pt on needing family to measure height of the steps to ensure this will work. Requested pt to take a picture of the stairs to help ensure safe set-up.   Discussed custom manual wheelchair consult because pt will be a life-long wheelchair user, and pt in agreement - will defer to primary PT to confirm if pt will qualify.   Pt manages w/c parts without cuing/assist during session. B UE w/c propulsion ~157ft x2 to/from main therapy gym with supervision - demos slow propulsion technique with poor UE alignment in this off-the shelf wheelchair.   Therapist provided visual demonstration on how to perform stair navigation using shower chair technique. Pt navigate 4 steps x2 (6" height) using R HR (to simulate HR on 1st set of steps at home) using shower chair technique with CGA and 2x heavy min assist because pt unable to come to standing from lower shower seat  height when ascending the stairs - pt practiced managing the shower chair during stair navigation, but noted to frequently skip a step when moving seat up during ascent requiring cuing to correct for safety. Pt verbalizes that she will need additional practice with this - recommended practicing daily until D/C to ensure she is safe and consistent with this technique - pt reports she prefers this technique over other options at this time.  Educated on need for family to manage getting wheelchair up/down stairs and positioning it at the bottom/top of the steps to allow her to perform a transfer to the wheelchair from shower seat.  At end of session, pt left seated in w/c with needs in reach and seat belt alarm on.  Therapy Documentation Precautions:  Precautions Precautions: Fall Precaution Comments: L AKA Required Braces or Orthoses: Other Brace Other Brace: retention sock to be donned 24/7 except for wound care and dressing/ bathing; shrinker to be used after staple removal Restrictions Weight Bearing Restrictions: Yes LLE Weight Bearing: Non weight bearing Other Position/Activity Restrictions: LLE   Pain: Reports pain is 6/10 at rest starting session; however, no grimacing or expression of significant pain throughout session.    Therapy/Group: Individual Therapy  Ginny Forth , PT, DPT, NCS, CSRS 01/28/2022, 3:34 PM

## 2022-01-29 LAB — BASIC METABOLIC PANEL
Anion gap: 13 (ref 5–15)
BUN: 8 mg/dL (ref 6–20)
CO2: 26 mmol/L (ref 22–32)
Calcium: 10.3 mg/dL (ref 8.9–10.3)
Chloride: 103 mmol/L (ref 98–111)
Creatinine, Ser: 0.53 mg/dL (ref 0.44–1.00)
GFR, Estimated: >60 mL/min (ref >60)
Glucose, Bld: 110 mg/dL — ABNORMAL HIGH (ref 70–99)
Potassium: 3.8 mmol/L (ref 3.5–5.1)
Sodium: 142 mmol/L (ref 135–145)

## 2022-01-29 MED ORDER — AMLODIPINE BESYLATE 5 MG PO TABS
5.0000 mg | ORAL_TABLET | Freq: Once | ORAL | Status: AC
  Administered 2022-01-29: 5 mg via ORAL
  Filled 2022-01-29: qty 1

## 2022-01-29 MED ORDER — AMLODIPINE BESYLATE 10 MG PO TABS
10.0000 mg | ORAL_TABLET | Freq: Every day | ORAL | Status: DC
  Administered 2022-01-30 – 2022-02-02 (×4): 10 mg via ORAL
  Filled 2022-01-29 (×4): qty 1

## 2022-01-29 NOTE — Progress Notes (Signed)
Patient asked to speak to a manager upset that visitors were were restricted due to covid outbreak on the unit. Spoke to the patient who stated that her mother was coming tomorrow & that no one called & explained it to her family. Explained to her that the director was in the process of calling all family members & that the measure was in response to present findings from the infection prevention department & department of health. She stated that she never had covid, that you can get it walking on the street & that its basically like the flu now. Tried to explain to the patient that we were trying to control the spread & that her unit was one of the units affected & we did not want her or her family to contract it. She stated that she wants to leave tomorrow Informed her that she could discuss it with the physician in the morning, but we have to follow the directions given this evening.

## 2022-01-29 NOTE — Progress Notes (Signed)
Occupational Therapy Session Note  Patient Details  Name: Jocelyn [name] MRN: [id number] Date of Birth: 1981/03/21  Today's Date: 01/29/2022 OT Individual Time: 1105-1200 OT Individual Time Calculation (min): 55 min    Short Term Goals: Week 1:  OT Short Term Goal 1 (Week 1): STG = LTG  Skilled Therapeutic Interventions/Progress Updates:  Skilled OT intervention completed with focus on BUE strengthening, functional transfers. Pt received seated in w/c, no c/o pain, declined shower or self-care. Self-propelled in w/c <> gym with mod I. Supervision sit > stand and stand pivot <> EOM using RW. Seated EOM, pt completed the following exercises to promote strength/endurance of BUE needed to maintain shoulder integrity with transfers when compensating for the amputated LE:  (With red theraband) x10 reps Horizontal abduction Self-anchored shoulder flexion each arm Self-anchored bicep flexion each arm Alternating chest presses Shoulder external rotation Therapist anchored scapular retraction Shoulder diagonal pulls - Pt did have max difficulty following verbal/visual cues, requiring increased multimodal cueing for form and technique however with repetition was able to repeat with accuracy. Would benefit from HEP as pt was fatigued with just 10 reps, however with max cues needed therefore would wait til form improves with independence. Pt remained seated in w/c, with chair alarm on and all needs in reach at end of session.    Therapy Documentation Precautions:  Precautions Precautions: Fall Precaution Comments: L AKA Required Braces or Orthoses: Other Brace Other Brace: retention sock to be donned 24/7 except for wound care and dressing/ bathing; shrinker to be used after staple removal Restrictions Weight Bearing Restrictions: Yes LLE Weight Bearing: Non weight bearing Other Position/Activity Restrictions: LLE    Therapy/Group: Individual Therapy  Melvyn Novas, MS,  OTR/L  01/29/2022, 7:32 AM

## 2022-01-29 NOTE — Progress Notes (Signed)
Physical Therapy Session Note  Patient Details  Name: Jocelyn [name] MRN: [id number] Date of Birth: 1981-04-25  Today's Date: 01/29/2022 PT Individual Time: 1000-1100, 1300-1410 PT Individual Time Calculation (min): 60 min, 70 min   Short Term Goals: Week 1:  PT Short Term Goal 1 (Week 1): STG = LTG d/t ELOS  Skilled Therapeutic Interventions/Progress Updates:    Session 1: Pt received in bed and agreeable to therapy. Pt reports muscle soreness but no residual limb pain this AM. Supine>sit and Stand pivot transfer to w/c with supervision. Pt propelled w/c with BUE >200 ft with supervision. Pt able to manage w/c locks and leg rest independently. Session focused on d/c planning and stair practice. Pt navigated 6" steps x 4 with hand rail and shower chair method. Pt is able to move showerchair her self with close supervision. Extensive time spent discussing pt's particular home set up and her plans for stair navigation. Pt then participated in w/c pushups for UE strength and endurance, 4 x 6. Rest breaks spent discussing d/c plan. Therapist also consulted with Apolinar Junes from [employer] during this time regarding custom w/c order, with plan to f/u outpatient d/t insurance coverage and d/c date early next week. Pt returned to room and remained in w/c, was left with all needs in reach.   Session 2: Pt seated in w/c on arrival and agreeable to therapy. No complaint of pain. Pt propelled w/c with BUE to [facility] entrance for community reintegration and benefit of outdoor environment on mood and outlook. Total distance >1200 ft this session. Pt navigated uneven and sloping surfaces, as well as uneven brick without issue. Pt then ambulated with close supervision and RW over brick x 80 ft and x 20 ft with cues to clear RLE to protect skin. Pt then returned to unit and to room with supervision. Supervision ambulatory transfer with RW to bathroom, distant supervision for 3/3 toileting tasks. Documented in flow sheet. Pt  then returned to bed with supervision and was left with all needs in reach and alarm active.    Therapy Documentation Precautions:  Precautions Precautions: Fall Precaution Comments: L AKA Required Braces or Orthoses: Other Brace Other Brace: retention sock to be donned 24/7 except for wound care and dressing/ bathing; shrinker to be used after staple removal Restrictions Weight Bearing Restrictions: Yes LLE Weight Bearing: Non weight bearing Other Position/Activity Restrictions: LLE General:    Therapy/Group: Individual Therapy  Juluis Rainier 01/29/2022, 10:58 AM

## 2022-01-29 NOTE — Progress Notes (Addendum)
PROGRESS NOTE   Subjective/Complaints: Had a good night sleep. Phantom limb pain is better. COVID neg  ROS: Patient denies fever, rash, sore throat, blurred vision, dizziness, nausea, vomiting, diarrhea, cough, shortness of breath or chest pain, back/neck pain, headache, or mood change.   Objective:   No results found. No results for input(s): "WBC", "HGB", "HCT", "PLT" in the last 72 hours.  Recent Labs    01/29/22 0605  NA 142  K 3.8  CL 103  CO2 26  GLUCOSE 110*  BUN 8  CREATININE 0.53  CALCIUM 10.3    Intake/Output Summary (Last 24 hours) at 01/29/2022 1027 Last data filed at 01/29/2022 0811 Gross per 24 hour  Intake 480 ml  Output --  Net 480 ml        Physical Exam: Vital Signs Blood pressure (!) 138/98, pulse 88, temperature 98.6 F (37 C), temperature source Oral, resp. rate 16, height 5\' 5"  (1.651 m), weight 93.6 kg, last menstrual period 01/05/2022, SpO2 99 %.  Constitutional: No distress . Vital signs reviewed. HEENT: NCAT, EOMI, oral membranes moist Neck: supple Cardiovascular: RRR without murmur. No JVD    Respiratory/Chest: CTA Bilaterally without wheezes or rales. Normal effort    GI/Abdomen: BS +, non-tender, non-distended Ext: no clubbing, cyanosis, or edema Psych: pleasant and cooperative  Skin: left AK with dressing and stump sock.  Neuro:  Alert and oriented x 3. Normal insight and awareness. Intact Memory. Normal language and speech. Cranial nerve exam unremarkable  UE motor 5/5. LLE 3+/5 HF RLE  4+/5.  Musculoskeletal: left leg still edematous. Less tender to palpation    Assessment/Plan: 1. Functional deficits which require 3+ hours per day of interdisciplinary therapy in a comprehensive inpatient rehab setting. Physiatrist is providing close team supervision and 24 hour management of active medical problems listed below. Physiatrist and rehab team continue to assess barriers to  discharge/monitor patient progress toward functional and medical goals  Care Tool:  Bathing    Body parts bathed by patient: Right arm, Left arm, Right lower leg, Chest, Abdomen, Front perineal area, Face, Buttocks, Right upper leg   Body parts bathed by helper:  (lower back and bottom) Body parts n/a: Right lower leg   Bathing assist Assist Level: Supervision/Verbal cueing     Upper Body Dressing/Undressing Upper body dressing   What is the patient wearing?: Hospital gown only    Upper body assist Assist Level: Supervision/Verbal cueing    Lower Body Dressing/Undressing Lower body dressing      What is the patient wearing?: Hospital gown only     Lower body assist Assist for lower body dressing: Supervision/Verbal cueing     Toileting Toileting    Toileting assist Assist for toileting: Minimal Assistance - Patient > 75%     Transfers Chair/bed transfer  Transfers assist  Chair/bed transfer activity did not occur: Safety/medical concerns  Chair/bed transfer assist level: Minimal Assistance - Patient > 75%     Locomotion Ambulation   Ambulation assist      Assist level: Contact Guard/Touching assist Assistive device: Walker-rolling Max distance: 30 ft   Walk 10 feet activity   Assist     Assist level:  Contact Guard/Touching assist Assistive device: Walker-rolling   Walk 50 feet activity   Assist Walk 50 feet with 2 turns activity did not occur: Safety/medical concerns         Walk 150 feet activity   Assist Walk 150 feet activity did not occur: Safety/medical concerns         Walk 10 feet on uneven surface  activity   Assist Walk 10 feet on uneven surfaces activity did not occur: Safety/medical concerns         Wheelchair     Assist Is the patient using a wheelchair?: Yes Type of Wheelchair: Manual    Wheelchair assist level: Contact Guard/Touching assist Max wheelchair distance: 50 ft    Wheelchair 50 feet with  2 turns activity    Assist        Assist Level: Contact Guard/Touching assist   Wheelchair 150 feet activity     Assist      Assist Level: Total Assistance - Patient < 25%   Blood pressure (!) 138/98, pulse 88, temperature 98.6 F (37 C), temperature source Oral, resp. rate 16, height 5\' 5"  (1.651 m), weight 93.6 kg, last menstrual period 01/05/2022, SpO2 99 %.  Medical Problem List and Plan: 1. 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  Antithrombotics: -DVT/anticoagulation:  Pharmaceutical: Eliquis             -antiplatelet therapy: N/A 3. Pain Management:  - e gabapentin 600mg  tid effective for phantom limb pain. -using oxycodone prn to avoid excessive tylenol 4. Mood/Behavior/Sleep: Provide emotional support             -antipsychotic agents: N/A 5. Neuropsych/cognition: This patient is capable of making decisions on her own behalf. 6. Skin/Wound Care: continue dry dressing to stump. Order stump sock with waist belt 7. Fluids/Electrolytes/Nutrition:  encourage PO  -Potassium 3.8 7/21-->  continue to supplement  -protein supp for low albumin    8.  Iron deficiency anemia.  Continue iron supplement.     -hgb 9.2 7/18 9.  Tobacco abuse.  NicoDerm patch.  Provide counseling 10.  Type II diabetes mellitus.  Latest hemoglobin A1c 5.1.  SSI.  Well controlled. CBG's discontinued 11.  Hypertension.  Norvasc 5 mg and HCTZ 25 mg daily.  Monitor with increased mobility  7/20 remains elevated. Pain component.   -amlodipine   5mg  daily resumed 7/19-increase to 10mg  7/21     LOS: 4 days A FACE TO FACE EVALUATION WAS PERFORMED  8/20 01/29/2022, 9:27 AM

## 2022-01-29 NOTE — Consult Note (Signed)
01/20/2022: Patient was asleep when I entered the room around 8 AM.  With attempts to wake her patient was slow to arouse and reported that she was very tired and did not want to visit at this time and I told her I would try to come back later if possible but times available were taken by other therapies.

## 2022-01-29 NOTE — Progress Notes (Addendum)
Patient ID: Jocelyn [name], female   DOB: [birth year], 41 y.o.   MRN: [id number]  SW confirmed with pt family edu on Monday  97/24) 9am-12pm with her mother and dtr. SW discussed TTB with [facility]).  SW ordered TTB and w/c with L amputee pad with [employer] [facility] (char[facility]).   Cecile Sheerer, MSW, LCSWA Office: [phone] Cell: [phone] Fax: [phone]

## 2022-01-30 NOTE — Progress Notes (Signed)
PROGRESS NOTE   Subjective/Complaints: Pt disappointed that mother can't come to visit her. I explained to her the reasons behind the decision to restrict visitors from entering. She is ultimately ok with it and understands. Her mother wanted to bring her some food. She asked if someone could pick up her food from front desk.  ROS: Patient denies fever, rash, sore throat, blurred vision, dizziness, nausea, vomiting, diarrhea, cough, shortness of breath or chest pain,  headache, or mood change.    Objective:   No results found. No results for input(s): "WBC", "HGB", "HCT", "PLT" in the last 72 hours.  Recent Labs    01/29/22 0605  NA 142  K 3.8  CL 103  CO2 26  GLUCOSE 110*  BUN 8  CREATININE 0.53  CALCIUM 10.3    Intake/Output Summary (Last 24 hours) at 01/30/2022 0816 Last data filed at 01/29/2022 1807 Gross per 24 hour  Intake 360 ml  Output --  Net 360 ml        Physical Exam: Vital Signs Blood pressure (!) 133/91, pulse 92, temperature 98.2 F (36.8 C), resp. rate 16, height 5\' 5"  (1.651 m), weight 93.6 kg, last menstrual period 01/05/2022, SpO2 97 %.  Constitutional: No distress . Vital signs reviewed. HEENT: NCAT, EOMI, oral membranes moist Neck: supple Cardiovascular: RRR without murmur. No JVD    Respiratory/Chest: CTA Bilaterally without wheezes or rales. Normal effort    GI/Abdomen: BS +, non-tender, non-distended Ext: no clubbing, cyanosis, or edema Psych: pleasant and cooperative  Skin: left AK with mild serous drainage. Wound well approximated with staples.  Neuro:  Alert and oriented x 3. Normal insight and awareness. Intact Memory. Normal language and speech. Cranial nerve exam unremarkable  UE motor 5/5. LLE 3+/5 HF RLE  4+/5.  Musculoskeletal: left leg still edematous. somewhat tender to palpation    Assessment/Plan: 1. Functional deficits which require 3+ hours per day of interdisciplinary  therapy in a comprehensive inpatient rehab setting. Physiatrist is providing close team supervision and 24 hour management of active medical problems listed below. Physiatrist and rehab team continue to assess barriers to discharge/monitor patient progress toward functional and medical goals  Care Tool:  Bathing    Body parts bathed by patient: Right arm, Left arm, Right lower leg, Chest, Abdomen, Front perineal area, Face, Buttocks, Right upper leg   Body parts bathed by helper:  (lower back and bottom) Body parts n/a: Right lower leg   Bathing assist Assist Level: Supervision/Verbal cueing     Upper Body Dressing/Undressing Upper body dressing   What is the patient wearing?: Hospital gown only    Upper body assist Assist Level: Supervision/Verbal cueing    Lower Body Dressing/Undressing Lower body dressing      What is the patient wearing?: Hospital gown only     Lower body assist Assist for lower body dressing: Supervision/Verbal cueing     Toileting Toileting    Toileting assist Assist for toileting: Minimal Assistance - Patient > 75%     Transfers Chair/bed transfer  Transfers assist  Chair/bed transfer activity did not occur: Safety/medical concerns  Chair/bed transfer assist level: Minimal Assistance - Patient > 75%  Locomotion Ambulation   Ambulation assist      Assist level: Contact Guard/Touching assist Assistive device: Walker-rolling Max distance: 30 ft   Walk 10 feet activity   Assist     Assist level: Contact Guard/Touching assist Assistive device: Walker-rolling   Walk 50 feet activity   Assist Walk 50 feet with 2 turns activity did not occur: Safety/medical concerns         Walk 150 feet activity   Assist Walk 150 feet activity did not occur: Safety/medical concerns         Walk 10 feet on uneven surface  activity   Assist Walk 10 feet on uneven surfaces activity did not occur: Safety/medical concerns          Wheelchair     Assist Is the patient using a wheelchair?: Yes Type of Wheelchair: Manual    Wheelchair assist level: Contact Guard/Touching assist Max wheelchair distance: 50 ft    Wheelchair 50 feet with 2 turns activity    Assist        Assist Level: Contact Guard/Touching assist   Wheelchair 150 feet activity     Assist      Assist Level: Total Assistance - Patient < 25%   Blood pressure (!) 133/91, pulse 92, temperature 98.2 F (36.8 C), resp. rate 16, height 5\' 5"  (1.651 m), weight 93.6 kg, last menstrual period 01/05/2022, SpO2 97 %.  Medical Problem List and Plan: 1. 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 See discussion above re: visitation restriction   2.  Antithrombotics: -DVT/anticoagulation:  Pharmaceutical: Eliquis             -antiplatelet therapy: N/A 3. Pain Management:  - gabapentin 600mg  tid effective for phantom limb pain. -using oxycodone prn to avoid excessive tylenol 4. Mood/Behavior/Sleep: Provide emotional support             -antipsychotic agents: N/A 5. Neuropsych/cognition: This patient is capable of making decisions on her own behalf. 6. Skin/Wound Care: still some serous drainage. Continue stump sock  -will convert to shrinker soon when drainage less 7. Fluids/Electrolytes/Nutrition:  encourage PO  -Potassium 3.8 7/21-->  continue to supplement  -protein supp for low albumin    8.  Iron deficiency anemia.  Continue iron supplement.     -hgb 9.2 7/18 9.  Tobacco abuse.  NicoDerm patch.  Provide counseling 10.  Type II diabetes mellitus.  Latest hemoglobin A1c 5.1.  SSI.  Well controlled. CBG's discontinued 11.  Hypertension.  Norvasc 5 mg and HCTZ 25 mg daily.  Monitor with increased mobility  7/22-amlodipine  increased to 10mg  7/21---observe for response today     LOS: 5 days A FACE TO FACE EVALUATION WAS PERFORMED  8/18 01/30/2022, 8:16 AM

## 2022-01-30 NOTE — Progress Notes (Signed)
Patient refuses to call and wait for help when she needs to go to the bathroom, Writer has explained why she needs to call and not move by herself.

## 2022-01-30 NOTE — Progress Notes (Signed)
Walked on in on patient standing up to go to the bathroom independently. Re-educated patient the first time and patient stated that she understood, however, she did the same thing again at 1840 and stated that she has to go when she feels it. Patient is unreceptive to education and is refusing safety measures. Patient is a VERY HIGH fall risk for lack of compliance. Will continue to monitor. Ed RN notified.     Rito Ehrlich, LPN

## 2022-01-31 LAB — SARS CORONAVIRUS 2 BY RT PCR: SARS Coronavirus 2 by RT PCR: NEGATIVE

## 2022-01-31 NOTE — Progress Notes (Signed)
Occupational Therapy Session Note  Patient Details  Name: Jocelyn [name] MRN: [id number] Date of Birth: Nov 25, 1980  Today's Date: 01/31/2022 OT Individual Time: 1100-1155 OT Individual Time Calculation (min): 55 min    Short Term Goals: Week 2:     Skilled Therapeutic Interventions/Progress Updates:    OT intervention with focus on w/c mobility, standing balance, BUE endurance, and safety awareness to increase independence with BADLs. W/c mobiity in room and in therapy spaces with mod I. Standing balance for Wii games (bowling) with supervision without rest breaks. Pt also engaged in boxing games while seated with focus on BUE endurance/strengthening. Pt returned to room and remained in w/c. Seat alarm activated. All needs within reach.   Therapy Documentation Precautions:  Precautions Precautions: Fall Precaution Comments: L AKA Required Braces or Orthoses: Other Brace Other Brace: retention sock to be donned 24/7 except for wound care and dressing/ bathing; shrinker to be used after staple removal Restrictions Weight Bearing Restrictions: Yes LLE Weight Bearing: Weight bearing as tolerated Other Position/Activity Restrictions: LLE   Pain:  Pt reports her pain is "ok"  Therapy/Group: Individual Therapy  Rich Brave 01/31/2022, 12:01 PM

## 2022-01-31 NOTE — Progress Notes (Signed)
PROGRESS NOTE   Subjective/Complaints: In good spirits today. Pain seems controlled.   ROS: Patient denies fever, rash, sore throat, blurred vision, dizziness, nausea, vomiting, diarrhea, cough, shortness of breath or chest pain,  back/neck pain, headache, or mood change.   Objective:   No results found. No results for input(s): "WBC", "HGB", "HCT", "PLT" in the last 72 hours.  Recent Labs    01/29/22 0605  NA 142  K 3.8  CL 103  CO2 26  GLUCOSE 110*  BUN 8  CREATININE 0.53  CALCIUM 10.3    Intake/Output Summary (Last 24 hours) at 01/31/2022 0818 Last data filed at 01/31/2022 0700 Gross per 24 hour  Intake 840 ml  Output --  Net 840 ml        Physical Exam: Vital Signs Blood pressure 137/78, pulse 94, temperature 98.2 F (36.8 C), resp. rate 18, height 5\' 5"  (1.651 m), weight 93.6 kg, last menstrual period 01/05/2022, SpO2 99 %.  Constitutional: No distress . Vital signs reviewed. obese HEENT: NCAT, EOMI, oral membranes moist Neck: supple Cardiovascular: RRR without murmur. No JVD    Respiratory/Chest: CTA Bilaterally without wheezes or rales. Normal effort    GI/Abdomen: BS +, non-tender, non-distended Ext: no clubbing, cyanosis  Psych: pleasant and cooperative  Skin: left AK with continued mild serous drainage. Wound well approximated with staples. Wearing retention sock Neuro:  Alert and oriented x 3. Normal insight and awareness. Intact Memory. Normal language and speech. Cranial nerve exam unremarkable  UE motor 5/5. LLE 3+/5 HF RLE  4+/5.  Musculoskeletal: left leg still edematous but improving    Assessment/Plan: 1. Functional deficits which require 3+ hours per day of interdisciplinary therapy in a comprehensive inpatient rehab setting. Physiatrist is providing close team supervision and 24 hour management of active medical problems listed below. Physiatrist and rehab team continue to assess  barriers to discharge/monitor patient progress toward functional and medical goals  Care Tool:  Bathing    Body parts bathed by patient: Right arm, Left arm, Right lower leg, Chest, Abdomen, Front perineal area, Face, Buttocks, Right upper leg   Body parts bathed by helper:  (lower back and bottom) Body parts n/a: Right lower leg   Bathing assist Assist Level: Supervision/Verbal cueing     Upper Body Dressing/Undressing Upper body dressing   What is the patient wearing?: Hospital gown only    Upper body assist Assist Level: Supervision/Verbal cueing    Lower Body Dressing/Undressing Lower body dressing      What is the patient wearing?: Hospital gown only     Lower body assist Assist for lower body dressing: Supervision/Verbal cueing     Toileting Toileting    Toileting assist Assist for toileting: Minimal Assistance - Patient > 75%     Transfers Chair/bed transfer  Transfers assist  Chair/bed transfer activity did not occur: Safety/medical concerns  Chair/bed transfer assist level: Minimal Assistance - Patient > 75%     Locomotion Ambulation   Ambulation assist      Assist level: Contact Guard/Touching assist Assistive device: Walker-rolling Max distance: 30 ft   Walk 10 feet activity   Assist     Assist level: Contact Guard/Touching  assist Assistive device: Walker-rolling   Walk 50 feet activity   Assist Walk 50 feet with 2 turns activity did not occur: Safety/medical concerns         Walk 150 feet activity   Assist Walk 150 feet activity did not occur: Safety/medical concerns         Walk 10 feet on uneven surface  activity   Assist Walk 10 feet on uneven surfaces activity did not occur: Safety/medical concerns         Wheelchair     Assist Is the patient using a wheelchair?: Yes Type of Wheelchair: Manual    Wheelchair assist level: Contact Guard/Touching assist Max wheelchair distance: 50 ft    Wheelchair  50 feet with 2 turns activity    Assist        Assist Level: Contact Guard/Touching assist   Wheelchair 150 feet activity     Assist      Assist Level: Total Assistance - Patient < 25%   Blood pressure 137/78, pulse 94, temperature 98.2 F (36.8 C), resp. rate 18, height 5\' 5"  (1.651 m), weight 93.6 kg, last menstrual period 01/05/2022, SpO2 99 %.  Medical Problem List and Plan: 1. 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  Antithrombotics: -DVT/anticoagulation:  Pharmaceutical: Eliquis             -antiplatelet therapy: N/A 3. Pain Management:  - gabapentin 600mg  tid effective for phantom limb pain. -using oxycodone prn to avoid excessive tylenol 4. Mood/Behavior/Sleep: Provide emotional support             -antipsychotic agents: N/A 5. Neuropsych/cognition: This patient is capable of making decisions on her own behalf. 6. Skin/Wound Care: still some serous drainage. Continue stump sock  - convert to shrinker soon when drainage is a little less. Pt has shrinker in room already 7. Fluids/Electrolytes/Nutrition:  encourage PO  -Potassium 3.8 7/21-->  continue to supplement  -protein supp for low albumin    8.  Iron deficiency anemia.  Continue iron supplement.     -hgb 9.2 7/18 9.  Tobacco abuse.  NicoDerm patch.  Provide counseling 10.  Type II diabetes mellitus.  Latest hemoglobin A1c 5.1.  SSI.  Well controlled. CBG's discontinued 11.  Hypertension.  Norvasc 5 mg and HCTZ 25 mg daily.  Monitor with increased mobility  7/23-amlodipine increased to 10mg  7/21---with improvement     LOS: 6 days A FACE TO FACE EVALUATION WAS PERFORMED  01/31/2022, 8:18 AM

## 2022-01-31 NOTE — Progress Notes (Signed)
Occupational Therapy Session Note  Patient Details  Name: Jocelyn [name] MRN: [id number] Date of Birth: 1980/11/11  Today's Date: 01/31/2022 OT Individual Time: 0700-0756 OT Individual Time Calculation (min): 56 min    Short Term Goals: Week 1:  OT Short Term Goal 1 (Week 1): STG = LTG  Skilled Therapeutic Interventions/Progress Updates:    Pt resting in bed upon arrival. Pt inquired if she could be made independent in room. Explained that was not possible today but if going home Tuesday, she could be made independent tomorrow. Pt stood from EOB and amb with RW to bathroom to gather wash cloths and towel. Pt amb with RW to sink and sat in w/c to complete grooming tasks. Pt propelled w/c to gym. 10 mins SciFit, Random level 5. 7 mins NuStep level 6. Standing activities/tasks with supervision. Pt propelled w/c to room. Seat alarm in place/activated and all needs within reach.   Therapy Documentation Precautions:  Precautions Precautions: Fall Precaution Comments: L AKA Required Braces or Orthoses: Other Brace Other Brace: retention sock to be donned 24/7 except for wound care and dressing/ bathing; shrinker to be used after staple removal Restrictions Weight Bearing Restrictions: Yes LLE Weight Bearing: Weight bearing as tolerated Other Position/Activity Restrictions: LLE Pain:  Pt reports she received pain meds before therapy and she is "ok"   Therapy/Group: Individual Therapy  Rich Brave 01/31/2022, 8:00 AM

## 2022-01-31 NOTE — Progress Notes (Signed)
Physical Therapy Session Note  Patient Details  Name: Jocelyn [name] MRN: [id number] Date of Birth: Aug 18, 1980  Today's Date: 01/31/2022 PT Individual Time: 0915-0957 PT Individual Time Calculation (min): 42 min   Short Term Goals: Week 1:  PT Short Term Goal 1 (Week 1): STG = LTG d/t ELOS  Skilled Therapeutic Interventions/Progress Updates:      Pt sitting in w/c to start - agreeable to therapy. No reports of pain during session.  Propel's herself mod I in w/c using BUE from her room to main rehab gym, ~174f.   Completed stair training via shower chair method with 1 hand rail on R, using 6inch steps. Up/down x4 steps total with CGA/minA provided and assist for managing shower chair during navigation. Pt reports she has adult children who can help with this at [place].  Hopped ~267fto high/low table with supervision and RW. Sit>Supine with supervision with pillows supporting. Supine there-ex with PT supervising and guiding on muscle activation and dosage. -2x10 single leg bridges on R (used sheet under hips to assist with lifting, AAROM) -2x10 sidelying hip abduction on L -1x10 sidelying hip extension on L  Encouraged pt to lay prone to stretch hips with prone push up but pt deferring due to fear of discomfort. Provided amputee ed during rest breaks of there-ex.  Squat<>pivot transfer with no AD and CGA to w/c. Propelled herself back to room, ~15067fmod I. Remained in w/c with chair alarm on, all needs met.    Therapy Documentation Precautions:  Precautions Precautions: Fall Precaution Comments: L AKA Required Braces or Orthoses: Other Brace Other Brace: retention sock to be donned 24/7 except for wound care and dressing/ bathing; shrinker to be used after staple removal Restrictions Weight Bearing Restrictions: Yes LLE Weight Bearing: Weight bearing as tolerated Other Position/Activity Restrictions: LLE General:     Therapy/Group: Individual Therapy  Jaquaveon Bilal P  Daxter Paule 01/31/2022, 7:03 AM

## 2022-01-31 NOTE — Progress Notes (Signed)
Physical Therapy Session Note  Patient Details  Name: Jocelyn [name] MRN: [id number] Date of Birth: 1980/10/17  Today's Date: 01/31/2022 PT Individual Time: 3500-9381 PT Individual Time Calculation (min): 43 min   Short Term Goals: Week 1:  PT Short Term Goal 1 (Week 1): STG = LTG d/t ELOS  Skilled Therapeutic Interventions/Progress Updates:  Chart reviewed and pt agreeable to therapy. Pt received supine in bed with no c/o pain. Session focused on independence with transfers and ambulation for safe home access after d/c and balance to promote functional mobility in preparation for prosthesis. Pt initiated session with set up and transfer to [facility] with ModI. Pt then completed 278ft WC propulsion to therapy gym with no assist. In therapy gym, pt completed 2x 16ft amb with ModI using RW. Pt then transferred to therapy mat with ModI. Pt then completed 4x 60sec SLS on foam mat using supervision + RW. Pt able to complete a task during each SLS trial, specifically 1-hand hold, R/L cervical rotation, cervical flexion/extension, and single arm reaches with supervision. Pt then returned to room and transferred to bed with ModI. At end of session, pt was left semi-reclined in bed with alarm engaged, nurse call bell and all needs in reach.   Therapy Documentation Precautions:  Precautions Precautions: Fall Precaution Comments: L AKA Required Braces or Orthoses: Other Brace Other Brace: retention sock to be donned 24/7 except for wound care and dressing/ bathing; shrinker to be used after staple removal Restrictions Weight Bearing Restrictions: Yes LLE Weight Bearing: Weight bearing as tolerated Other Position/Activity Restrictions: LLE     Therapy/Group: Individual Therapy  Dionne Milo, PT, DPT 01/31/2022, 3:47 PM

## 2022-02-01 ENCOUNTER — Other Ambulatory Visit (HOSPITAL_COMMUNITY): Payer: Self-pay

## 2022-02-01 LAB — CBC
HCT: 36.5 % (ref 36.0–46.0)
Hemoglobin: 10.9 g/dL — ABNORMAL LOW (ref 12.0–15.0)
MCH: 24.9 pg — ABNORMAL LOW (ref 26.0–34.0)
MCHC: 29.9 g/dL — ABNORMAL LOW (ref 30.0–36.0)
MCV: 83.3 fL (ref 80.0–100.0)
Platelets: 514 10*3/uL — ABNORMAL HIGH (ref 150–400)
RBC: 4.38 MIL/uL (ref 3.87–5.11)
RDW: 21.5 % — ABNORMAL HIGH (ref 11.5–15.5)
WBC: 6.9 10*3/uL (ref 4.0–10.5)
nRBC: 0 % (ref 0.0–0.2)

## 2022-02-01 LAB — BASIC METABOLIC PANEL
Anion gap: 10 (ref 5–15)
BUN: 13 mg/dL (ref 6–20)
CO2: 24 mmol/L (ref 22–32)
Calcium: 10.3 mg/dL (ref 8.9–10.3)
Chloride: 105 mmol/L (ref 98–111)
Creatinine, Ser: 0.66 mg/dL (ref 0.44–1.00)
GFR, Estimated: >60 mL/min (ref >60)
Glucose, Bld: 128 mg/dL — ABNORMAL HIGH (ref 70–99)
Potassium: 3.7 mmol/L (ref 3.5–5.1)
Sodium: 139 mmol/L (ref 135–145)

## 2022-02-01 MED ORDER — METHOCARBAMOL 500 MG PO TABS
500.0000 mg | ORAL_TABLET | Freq: Four times a day (QID) | ORAL | 0 refills | Status: DC | PRN
  Filled 2022-02-01: qty 60, 15d supply, fill #0

## 2022-02-01 MED ORDER — POTASSIUM CHLORIDE CRYS ER 20 MEQ PO TBCR
20.0000 meq | EXTENDED_RELEASE_TABLET | Freq: Every day | ORAL | 0 refills | Status: DC
  Filled 2022-02-01: qty 30, 30d supply, fill #0

## 2022-02-01 MED ORDER — OXYCODONE HCL 5 MG PO TABS
5.0000 mg | ORAL_TABLET | Freq: Four times a day (QID) | ORAL | 0 refills | Status: DC | PRN
Start: 2022-02-01 — End: 2022-02-01
  Filled 2022-02-01: qty 28, 7d supply, fill #0

## 2022-02-01 MED ORDER — HYDROCHLOROTHIAZIDE 25 MG PO TABS
25.0000 mg | ORAL_TABLET | Freq: Every day | ORAL | 0 refills | Status: DC
  Filled 2022-02-01: qty 30, 30d supply, fill #0

## 2022-02-01 MED ORDER — ACETAMINOPHEN 325 MG PO TABS: 325.0000 mg | ORAL_TABLET | ORAL | Status: AC | PRN

## 2022-02-01 MED ORDER — GABAPENTIN 600 MG PO TABS
600.0000 mg | ORAL_TABLET | Freq: Three times a day (TID) | ORAL | 0 refills | Status: DC
  Filled 2022-02-01: qty 90, 30d supply, fill #0

## 2022-02-01 MED ORDER — AMLODIPINE BESYLATE 10 MG PO TABS
10.0000 mg | ORAL_TABLET | Freq: Every day | ORAL | 0 refills | Status: DC
  Filled 2022-02-01: qty 30, 30d supply, fill #0

## 2022-02-01 MED ORDER — TRAZODONE HCL 50 MG PO TABS
25.0000 mg | ORAL_TABLET | Freq: Every evening | ORAL | 0 refills | Status: DC | PRN
  Filled 2022-02-01: qty 10, 10d supply, fill #0

## 2022-02-01 MED ORDER — NICOTINE 21 MG/24HR TD PT24
21.0000 mg | MEDICATED_PATCH | Freq: Every day | TRANSDERMAL | 0 refills | Status: DC
  Filled 2022-02-01: qty 28, 28d supply, fill #0

## 2022-02-01 MED ORDER — IRON 325 (65 FE) MG PO TABS
1.0000 | ORAL_TABLET | Freq: Every day | ORAL | 0 refills | Status: DC
  Filled 2022-02-01: qty 60, 60d supply, fill #0

## 2022-02-01 MED ORDER — APIXABAN 5 MG PO TABS
5.0000 mg | ORAL_TABLET | Freq: Two times a day (BID) | ORAL | 1 refills | Status: DC
  Filled 2022-02-01: qty 60, 30d supply, fill #0

## 2022-02-01 NOTE — Progress Notes (Addendum)
Physical Therapy Discharge Summary  Patient Details  Name: Jocelyn [name] MRN: [id number] Date of Birth: April 20, 1981  Today's Date: 02/01/2022 PT Individual Time: 0900-1000 PT Individual Time Calculation (min): 60 min    Patient has met 11 of 11 long term goals due to improved balance, improved postural control, increased strength, decreased pain, and ability to compensate for deficits.  Patient to discharge at a wheelchair level Modified Independent.   Patient's care partner is independent to provide the necessary physical assistance at discharge.  Reasons goals not met: NA  Recommendation:  Patient will benefit from ongoing skilled PT services in outpatient setting once she received prosthesis to continue to advance safe functional mobility, address ongoing impairments in strength, balance, prosthetic use, and minimize fall risk.  Equipment: W/c  Reasons for discharge: treatment goals met and discharge from hospital  Patient/family agrees with progress made and goals achieved: Yes  Skilled Therapeutic Interventions/Progress Updates: Pt seated EOB on arrival and performs mod I Stand pivot transfer to w/c with RW. Pt able to direct her care throughout session. No complaint of pain. Pt propelled w/c with BUE mod I throughout session. Session focused on d/c assessment and discussing pt's d/c plan.  Pt performed Stand pivot transfer <>car simulator with RW and without using grab bars. Pt was able to perform both ways safely depending on car height and availability of grab bars, which pt was not able to fully recall. Pt also navigated stairs x 8 with R hand rail and shower chair. Pt required seated rest break d/t fatigue. Discussed safest place to stop and rest at landing and how to instruct someone on moving the chair. On return to room, pt provided with HEP hand out and verbally discussed as pt had previously worked on these exercises with another therapist. Pt remained in w/c at end of session  to await nursing family education session. Pt made mod I in her room, nsg made aware.  PT Discharge Precautions/Restrictions Precautions Precautions: Fall Precaution Comments: L AKA Required Braces or Orthoses: Other Brace Other Brace: retention sock to be donned 24/7 except for wound care and dressing/ bathing; shrinker to be used after staple removal Restrictions Weight Bearing Restrictions: Yes LLE Weight Bearing: Weight bearing as tolerated Other Position/Activity Restrictions: LLE Vital Signs  Pain Pain Assessment Pain Scale: 0-10 Pain Score: 2  Pain Interference Pain Interference Pain Effect on Sleep: 2. Occasionally Pain Interference with Therapy Activities: 1. Rarely or not at all Pain Interference with Day-to-Day Activities: 1. Rarely or not at all Vision/Perception  Vision - History Ability to See in Adequate Light: 0 Adequate Perception Perception: Within Functional Limits Praxis Praxis: Intact Praxis Impairment Details: Motor planning  Cognition Overall Cognitive Status: Within Functional Limits for tasks assessed Arousal/Alertness: Awake/alert Orientation Level: Oriented X4 (Simultaneous filing. User may not have seen previous data.) Year: 2023 Month: July Day of Week: Correct Attention: Selective Selective Attention: Appears intact Memory: Appears intact Awareness: Appears intact Problem Solving: Appears intact Safety/Judgment: Appears intact Sensation Sensation Light Touch: Appears Intact Hot/Cold: Appears Intact Proprioception: Appears Intact Stereognosis: Not tested Coordination Gross Motor Movements are Fluid and Coordinated: Yes Coordination and Movement Description: impaired secondary to L AKA and training needed for balance Motor  Motor Motor: Within Functional Limits Motor - Skilled Clinical Observations: favors being to R side in supine, sitting and standing d/t LLE pain  Mobility Bed Mobility Rolling Right: Independent with assistive  device Rolling Left: Independent with assistive device Right Sidelying to Sit: Independent with assistive device  Supine to Sit: Independent with assistive device Sit to Supine: Independent with assistive device Transfers Transfers: Sit to Stand;Stand to Sit;Stand Pivot Transfers Sit to Stand: Independent with assistive device Stand to Sit: Independent with assistive device Stand Pivot Transfers: Independent with assistive device Transfer (Assistive device): Rolling walker Locomotion  Gait Ambulation: Yes Gait Assistance: Independent with assistive device Gait Distance (Feet): 150 Feet Assistive device: Rolling walker Gait Gait: Yes Gait Pattern: Impaired Gait Pattern:  (hop to gait pattern) Stairs / Additional Locomotion Stairs: Yes Stairs Assistance: Supervision/Verbal cueing Stair Management Technique: With walker Number of Stairs: 4 Height of Stairs: 6 Ramp: Independent with assistive device Curb:  (pt refused, but likely able) Pick up small object from the floor assist level: Independent with assistive device Pick up small object from the floor assistive device: Art gallery manager Mobility: Yes Wheelchair Assistance: Independent with Camera operator: Both upper extremities Wheelchair Parts Management: Independent Distance: >600 ft  Trunk/Postural Assessment  Cervical Assessment Cervical Assessment: Within Functional Limits Thoracic Assessment Thoracic Assessment: Within Functional Limits Lumbar Assessment Lumbar Assessment: Within Functional Limits Postural Control Postural Control: Within Functional Limits  Balance Balance Balance Assessed: Yes Static Sitting Balance Static Sitting - Level of Assistance: 7: Independent Dynamic Sitting Balance Dynamic Sitting - Level of Assistance: 6: Modified independent (Device/Increase time) Static Standing Balance Static Standing - Balance Support: During functional  activity;Bilateral upper extremity supported Static Standing - Level of Assistance: 6: Modified independent (Device/Increase time) Dynamic Standing Balance Dynamic Standing - Balance Support: During functional activity;Bilateral upper extremity supported Dynamic Standing - Level of Assistance: 6: Modified independent (Device/Increase time) Extremity Assessment      RLE Assessment RLE Assessment: Exceptions to [facility] General Strength Comments: Grossly 4 to 4+/ 5 prox to dist LLE Assessment General Strength Comments: unable to test d/t pain and NWB    Margo Lama C Prayan Ulin 02/01/2022, 12:12 PM

## 2022-02-01 NOTE — Plan of Care (Signed)
Pt to d/c tomorrow 07/25.

## 2022-02-01 NOTE — Progress Notes (Signed)
Patient ID: Jocelyn [name], female   DOB: 1981-01-18, 41 y.o.   MRN: [id number]  SW met with pt and pt mother in room to discuss d/c. SW informed on w/c and TTB ordered with [employer] [facility] (charity). SW reviewed meds will go to current pharmacy. No questions/concerns reported.   SW confirmed with pt she will d/c tomorrow.   Loralee Pacas, MSW, [place] Office: [phone] Cell: [phone] Fax: [phone]

## 2022-02-01 NOTE — Progress Notes (Signed)
Occupational Therapy Note  Patient Details  Name: Jocelyn [name] MRN: [id number] Date of Birth: Sep 23, 1980  Today's Date: 02/01/2022 OT Missed Time: 60 Minutes Missed Time Reason: Patient unwilling/refused to participate without medical reason  Pt declined further therapy with pending d/c 11/25.   [name] [facility] 02/01/2022, 12:17 PM

## 2022-02-01 NOTE — Progress Notes (Signed)
Occupational Therapy Discharge Summary  Patient Details  Name: Jocelyn [name] MRN: [id number] Date of Birth: 03-11-1981   Patient has met 9 of 9 long term goals due to {due to:3041651}.  Pt made excellent progress with BADLs and functional transfers during this admission. Pt completes all BADLs and all tranfsers at mod I level. Family has not been present for therapy. Patient to discharge at overall {LOA:3049010} level.  Patient's care partner {care partner:3041650} to provide the necessary {assistance:3041652} assistance at discharge.    Reasons goals not met: n/a  Recommendation:  Patient will benefit from ongoing skilled OT services in {setting:3041680} to continue to advance functional skills in the area of {ADL/iADL:3041649}.  Equipment: TTB  Reasons for discharge: {Reason for discharge:3049018}  Patient/family agrees with progress made and goals achieved: {Pt/Family agree with progress/goals:3049020}  OT Discharge ADL ADL Eating: Independent Where Assessed-Eating: Wheelchair Grooming: Independent Where Assessed-Grooming: Sitting at sink Upper Body Bathing: Independent Where Assessed-Upper Body Bathing: Sitting at sink Lower Body Bathing: Modified independent Where Assessed-Lower Body Bathing: Sitting at sink, Standing at sink Upper Body Dressing: Modified independent (Device) Where Assessed-Upper Body Dressing: Sitting at sink, Standing at sink Lower Body Dressing: Modified independent Where Assessed-Lower Body Dressing: Sitting at sink, Standing at sink Toileting: Modified independent Where Assessed-Toileting: Toilet, Recruitment consultant Transfer: Modified independent Armed forces technical officer Method: Counselling psychologist: Engineer, technical sales Transfer: Distant supervision Tub/Shower Transfer Method: Optometrist: Facilities manager: Moderate assistance ADL Comments: Pt did not shower d/t fears of getting  incisions wet. Pt completed wash up at the sink. Vision Baseline Vision/History: 0 No visual deficits Patient Visual Report: No change from baseline Vision Assessment?: No apparent visual deficits Perception  Perception: Within Functional Limits Praxis Praxis: Intact Cognition Cognition Overall Cognitive Status: Within Functional Limits for tasks assessed Arousal/Alertness: Awake/alert Orientation Level: Person;Place;Situation Person: Oriented Place: Oriented Situation: Oriented Memory: Appears intact Attention: Selective Selective Attention: Appears intact Awareness: Appears intact Problem Solving: Appears intact Safety/Judgment: Appears intact Brief Interview for Mental Status (BIMS) Repetition of Three Words (First Attempt): 3 Temporal Orientation: Year: Correct Temporal Orientation: Month: Accurate within 5 days Temporal Orientation: Day: Correct Recall: "Sock": Yes, no cue required Recall: "Blue": Yes, no cue required Recall: "Bed": Yes, no cue required BIMS Summary Score: 15 Sensation Sensation Light Touch: Appears Intact Hot/Cold: Appears Intact Proprioception: Appears Intact Stereognosis: Not tested Coordination Gross Motor Movements are Fluid and Coordinated: Yes Fine Motor Movements are Fluid and Coordinated: Yes Motor  Motor Motor: Within Functional Limits Trunk/Postural Assessment  Cervical Assessment Cervical Assessment: Within Functional Limits Thoracic Assessment Thoracic Assessment: Within Functional Limits Lumbar Assessment Lumbar Assessment: Within Functional Limits Postural Control Postural Control: Within Functional Limits  Balance Static Sitting Balance Static Sitting - Level of Assistance: 7: Independent Dynamic Sitting Balance Dynamic Sitting - Balance Support: During functional activity Dynamic Sitting - Level of Assistance: 6: Modified independent (Device/Increase time) Extremity/Trunk Assessment RUE Assessment RUE Assessment:  Within Functional Limits General Strength Comments: MMT 5/5 shoulder/elbow flexion, grip intact LUE Assessment LUE Assessment: Within Functional Limits General Strength Comments: MMT 5/5 shoulder/elbow flexion, grip intact   Leroy Libman 02/01/2022, 8:11 AM

## 2022-02-01 NOTE — Progress Notes (Signed)
Occupational Therapy Session Note  Patient Details  Name: Jocelyn [name] MRN: [id number] Date of Birth: 1981-02-11  Today's Date: 02/01/2022 OT Individual Time: 0700-0810 OT Individual Time Calculation (min): 70 min    Short Term Goals: Week 1:  OT Short Term Goal 1 (Week 1): STG = LTG  Skilled Therapeutic Interventions/Progress Updates:    OT resting in bed upon arrival and ready for therapy. Pt already bathed and donned clean gowns. OT intervention with focus on functional tranfsers, standing balance, RLE/BUE therex, core strengthening, safety awareness, and discharge planning. All functional transfers mod I. Pt amb with RW in room and Day Room at mod I level. NuStep 10 mins Level 6. Core strengthening with modified sit-ups and tossing ball to OTA. W/c pushups 3x10. W/c mobility mod I. Pt returned to room and tranfserred to EOB. All needs within reach.   Therapy Documentation Precautions:  Precautions Precautions: Fall Precaution Comments: L AKA Required Braces or Orthoses: Other Brace Other Brace: retention sock to be donned 24/7 except for wound care and dressing/ bathing; shrinker to be used after staple removal Restrictions Weight Bearing Restrictions: Yes LLE Weight Bearing: (P) Weight bearing as tolerated Other Position/Activity Restrictions: LLE Pain:  Pt received pain meds prior to therapy and states her pain is "ok"   Therapy/Group: Individual Therapy  Rich Brave 02/01/2022, 8:13 AM

## 2022-02-01 NOTE — Progress Notes (Signed)
PROGRESS NOTE   Subjective/Complaints:  Pt reports did a dry shampoo and brushed teeth even before OT showed up.  Pain doing better- taking meds q6 hours.  LBM yesterday.    ROS:  Pt denies SOB, abd pain, CP, N/V/C/D, and vision changes  Objective:   No results found. No results for input(s): "WBC", "HGB", "HCT", "PLT" in the last 72 hours.  No results for input(s): "NA", "K", "CL", "CO2", "GLUCOSE", "BUN", "CREATININE", "CALCIUM" in the last 72 hours.   Intake/Output Summary (Last 24 hours) at 02/01/2022 0819 Last data filed at 02/01/2022 0753 Gross per 24 hour  Intake 720 ml  Output --  Net 720 ml        Physical Exam: Vital Signs Blood pressure 133/84, pulse (!) 102, temperature 98.1 F (36.7 C), resp. rate 15, height 5\' 5"  (1.651 m), weight 93.6 kg, last menstrual period 01/05/2022, SpO2 94 %.    General: awake, alert, appropriate, walking across room with RW; OTA in room; NAD HENT: conjugate gaze; oropharynx moist CV: regular rate; no JVD Pulmonary: CTA B/L; no W/R/R- good air movement GI: soft, NT, ND, (+)BS Psychiatric: appropriate- wants to leave tomorrow Neurological: Ox3  Ext: no clubbing, cyanosis  Psych: pleasant and cooperative  Skin: left AK with continued mild serous drainage. Wound well approximated with staples. Wearing retention sock- stable- C/D/I Neuro:  Alert and oriented x 3. Normal insight and awareness. Intact Memory. Normal language and speech. Cranial nerve exam unremarkable  UE motor 5/5. LLE 3+/5 HF RLE  4+/5.  Musculoskeletal: left leg still edematous but improving    Assessment/Plan: 1. Functional deficits which require 3+ hours per day of interdisciplinary therapy in a comprehensive inpatient rehab setting. Physiatrist is providing close team supervision and 24 hour management of active medical problems listed below. Physiatrist and rehab team continue to assess barriers to  discharge/monitor patient progress toward functional and medical goals  Care Tool:  Bathing    Body parts bathed by patient: Right arm, Left arm, Right lower leg, Chest, Abdomen, Front perineal area, Face, Buttocks, Right upper leg   Body parts bathed by helper:  (lower back and bottom) Body parts n/a: Left upper leg, Left lower leg   Bathing assist Assist Level: Independent with assistive device     Upper Body Dressing/Undressing Upper body dressing   What is the patient wearing?: Hospital gown only    Upper body assist Assist Level: Independent    Lower Body Dressing/Undressing Lower body dressing      What is the patient wearing?: Hospital gown only     Lower body assist Assist for lower body dressing: Independent     Toileting Toileting    Toileting assist Assist for toileting: Independent with assistive device     Transfers Chair/bed transfer  Transfers assist  Chair/bed transfer activity did not occur: Safety/medical concerns  Chair/bed transfer assist level: Minimal Assistance - Patient > 75%     Locomotion Ambulation   Ambulation assist      Assist level: Contact Guard/Touching assist Assistive device: Walker-rolling Max distance: 30 ft   Walk 10 feet activity   Assist     Assist level: Contact Guard/Touching assist Assistive  device: Walker-rolling   Walk 50 feet activity   Assist Walk 50 feet with 2 turns activity did not occur: Safety/medical concerns         Walk 150 feet activity   Assist Walk 150 feet activity did not occur: Safety/medical concerns         Walk 10 feet on uneven surface  activity   Assist Walk 10 feet on uneven surfaces activity did not occur: Safety/medical concerns         Wheelchair     Assist Is the patient using a wheelchair?: Yes Type of Wheelchair: Manual    Wheelchair assist level: Contact Guard/Touching assist Max wheelchair distance: 50 ft    Wheelchair 50 feet with 2  turns activity    Assist        Assist Level: Contact Guard/Touching assist   Wheelchair 150 feet activity     Assist      Assist Level: Total Assistance - Patient < 25%   Blood pressure 133/84, pulse (!) 102, temperature 98.1 F (36.7 C), resp. rate 15, height 5\' 5"  (1.651 m), weight 93.6 kg, last menstrual period 01/05/2022, SpO2 94 %.  Medical Problem List and Plan: 1. 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  Antithrombotics: -DVT/anticoagulation:  Pharmaceutical: Eliquis             -antiplatelet therapy: N/A 3. Pain Management:  - gabapentin 600mg  tid effective for phantom limb pain. -using oxycodone prn to avoid excessive tylenol 7/24- pain controlled- taking meds q6 hours prn-  4. Mood/Behavior/Sleep: Provide emotional support             -antipsychotic agents: N/A 5. Neuropsych/cognition: This patient is capable of making decisions on her own behalf. 6. Skin/Wound Care: still some serous drainage. Continue stump sock  - convert to shrinker soon when drainage is a little less. Pt has shrinker in room already 7. Fluids/Electrolytes/Nutrition:  encourage PO  -Potassium 3.8 7/21-->  continue to supplement  -protein supp for low albumin    8.  Iron deficiency anemia.  Continue iron supplement.     -hgb 9.2 7/18 9.  Tobacco abuse.  NicoDerm patch.  Provide counseling 10.  Type II diabetes mellitus.  Latest hemoglobin A1c 5.1.  SSI.  Well controlled. CBG's discontinued  7/24- change diet to regular pt request 11.  Hypertension.  Norvasc 5 mg and HCTZ 25 mg daily.  Monitor with increased mobility  7/23-amlodipine increased to 10mg  7/21---with improvement  7/24- BP controlled- cont' regimen    LOS: 7 days A FACE TO FACE EVALUATION WAS PERFORMED  Tawnie Ehresman 02/01/2022, 8:19 AM

## 2022-02-02 MED ORDER — TRAZODONE HCL 50 MG PO TABS: 25.0000 mg | ORAL_TABLET | Freq: Every evening | ORAL | 0 refills | Status: AC | PRN

## 2022-02-02 MED ORDER — METHOCARBAMOL 500 MG PO TABS: 500.0000 mg | ORAL_TABLET | Freq: Four times a day (QID) | ORAL | 0 refills | Status: AC | PRN

## 2022-02-02 MED ORDER — HYDROCHLOROTHIAZIDE 25 MG PO TABS: 25.0000 mg | ORAL_TABLET | Freq: Every day | ORAL | 0 refills | Status: AC

## 2022-02-02 MED ORDER — GABAPENTIN 600 MG PO TABS: 600.0000 mg | ORAL_TABLET | Freq: Three times a day (TID) | ORAL | 0 refills | Status: DC

## 2022-02-02 MED ORDER — NICOTINE 21 MG/24HR TD PT24: 21.0000 mg | MEDICATED_PATCH | Freq: Every day | TRANSDERMAL | 0 refills | Status: AC

## 2022-02-02 MED ORDER — POTASSIUM CHLORIDE CRYS ER 20 MEQ PO TBCR: 20.0000 meq | EXTENDED_RELEASE_TABLET | Freq: Every day | ORAL | 0 refills | Status: AC

## 2022-02-02 MED ORDER — IRON 325 (65 FE) MG PO TABS: 1.0000 | ORAL_TABLET | Freq: Every day | ORAL | 0 refills | Status: AC

## 2022-02-02 MED ORDER — OXYCODONE HCL 5 MG PO TABS: 5.0000 mg | ORAL_TABLET | Freq: Four times a day (QID) | ORAL | 0 refills | Status: AC | PRN

## 2022-02-02 MED ORDER — AMLODIPINE BESYLATE 10 MG PO TABS
10.0000 mg | ORAL_TABLET | Freq: Every day | ORAL | 0 refills | Status: AC
Start: 2022-02-02 — End: ?

## 2022-02-02 MED ORDER — APIXABAN 5 MG PO TABS: 5.0000 mg | ORAL_TABLET | Freq: Two times a day (BID) | ORAL | 1 refills | Status: AC

## 2022-02-02 NOTE — Progress Notes (Signed)
Inpatient Rehabilitation Care Coordinator Discharge Note   Patient Details  Name: SUSY PLACZEK MRN: [id number] Date of Birth: 03/26/1981   Discharge location: D/c to home  Length of Stay: 7 days  Discharge activity level: Mod I at w/c level  Home/community participation: Limited  Patient response TG:YBWLSL Literacy - How often do you need to have someone help you when you read instructions, pamphlets, or other written material from your doctor or pharmacy?: Never  Patient response HT:DSKAJG Isolation - How often do you feel lonely or isolated from those around you?: Never  Services provided included: MD, RD, PT, OT, RN, CM, TR, Pharmacy, Neuropsych, SW  Financial Services:  Field seismologist Utilized: Other (Comment) (Uninsured)    Choices offered to/list presented to: Yes  Follow-up services arranged:  DME      DME : [employer] w/c and TTB (charity)    Patient response to transportation need: Is the patient able to respond to transportation needs?: Yes In the past 12 months, has lack of transportation kept you from medical appointments or from getting medications?: No In the past 12 months, has lack of transportation kept you from meetings, work, or from getting things needed for daily living?: No  Comments (or additional information):  Patient/Family verbalized understanding of follow-up arrangements:  Yes  Individual responsible for coordination of the follow-up plan: contact pt  Confirmed correct DME delivered: Gretchen Short 02/02/2022    Gretchen Short

## 2022-02-02 NOTE — Progress Notes (Signed)
Pt discharged off unit with family and all belongings. Patient did not want to be assisted by staff down to discharge, she was assisted by her daughter. Education given to patient by pa sandra.   Jocelyn [name]

## 2022-02-02 NOTE — Progress Notes (Signed)
PROGRESS NOTE   Subjective/Complaints:  Pt reports taking pain meds q6 hours- and tylenol in between.  Ready for d/c today- explained will need f/u with surgeon Jocelyn [name], as well as PCP and me.   ROS:   Pt denies SOB, abd pain, CP, N/V/C/D, and vision changes  Objective:   No results found. Recent Labs    02/01/22 0807  WBC 6.9  HGB 10.9*  HCT 36.5  PLT 514*    Recent Labs    02/01/22 0807  NA 139  K 3.7  CL 105  CO2 24  GLUCOSE 128*  BUN 13  CREATININE 0.66  CALCIUM 10.3     Intake/Output Summary (Last 24 hours) at 02/02/2022 0839 Last data filed at 02/01/2022 1833 Gross per 24 hour  Intake 240 ml  Output --  Net 240 ml        Physical Exam: Vital Signs Blood pressure 114/83, pulse 93, temperature 98.3 F (36.8 C), temperature source Oral, resp. rate 18, height 5\' 5"  (1.651 m), weight 93.6 kg, last menstrual period 01/05/2022, SpO2 97 %.     General: awake, alert, appropriate, sitting up on bed; bright affect; NAD HENT: conjugate gaze; oropharynx moist CV: regular rate; no JVD Pulmonary: CTA B/L; no W/R/R- good air movement GI: soft, NT, ND, (+)BS Psychiatric: appropriate; bright affect Neurological: Ox3 Ext: no clubbing, cyanosis  Psych: pleasant and cooperative  Skin: left AK with continued mild serous drainage. Wound well approximated with staples. Wearing retention sock- stable- C/D/I Neuro:  Alert and oriented x 3. Normal insight and awareness. Intact Memory. Normal language and speech. Cranial nerve exam unremarkable  UE motor 5/5. LLE 3+/5 HF RLE  4+/5.  Musculoskeletal: left leg still edematous but improving    Assessment/Plan: 1. Functional deficits which require 3+ hours per day of interdisciplinary therapy in a comprehensive inpatient rehab setting. Physiatrist is providing close team supervision and 24 hour management of active medical problems listed below. Physiatrist and rehab  team continue to assess barriers to discharge/monitor patient progress toward functional and medical goals  Care Tool:  Bathing    Body parts bathed by patient: Right arm, Left arm, Right lower leg, Chest, Abdomen, Front perineal area, Face, Buttocks, Right upper leg   Body parts bathed by helper:  (lower back and bottom) Body parts n/a: Left upper leg, Left lower leg   Bathing assist Assist Level: Independent with assistive device     Upper Body Dressing/Undressing Upper body dressing   What is the patient wearing?: Hospital gown only    Upper body assist Assist Level: Independent    Lower Body Dressing/Undressing Lower body dressing      What is the patient wearing?: Hospital gown only     Lower body assist Assist for lower body dressing: Independent     Toileting Toileting    Toileting assist Assist for toileting: Independent with assistive device     Transfers Chair/bed transfer  Transfers assist  Chair/bed transfer activity did not occur: Safety/medical concerns  Chair/bed transfer assist level: Independent with assistive device Chair/bed transfer assistive device: 01/07/2022   Ambulation assist      Assist level: Independent  with assistive device Assistive device: Walker-rolling Max distance: 150 ft   Walk 10 feet activity   Assist     Assist level: Independent with assistive device Assistive device: Walker-rolling   Walk 50 feet activity   Assist Walk 50 feet with 2 turns activity did not occur: Safety/medical concerns  Assist level: Independent with assistive device Assistive device: Walker-rolling    Walk 150 feet activity   Assist Walk 150 feet activity did not occur: Safety/medical concerns  Assist level: Independent with assistive device Assistive device: Walker-rolling    Walk 10 feet on uneven surface  activity   Assist Walk 10 feet on uneven surfaces activity did not occur: Safety/medical  concerns         Wheelchair     Assist Is the patient using a wheelchair?: Yes Type of Wheelchair: Manual    Wheelchair assist level: Independent Max wheelchair distance: >662ft    Wheelchair 50 feet with 2 turns activity    Assist        Assist Level: Independent   Wheelchair 150 feet activity     Assist      Assist Level: Independent   Blood pressure 114/83, pulse 93, temperature 98.3 F (36.8 C), temperature source Oral, resp. rate 18, height 5\' 5"  (1.651 m), weight 93.6 kg, last menstrual period 01/05/2022, SpO2 97 %.  Medical Problem List and Plan: 1. 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  2.  Antithrombotics: -DVT/anticoagulation:  Pharmaceutical: Eliquis             -antiplatelet therapy: N/A 3. Pain Management:  - gabapentin 600mg  tid effective for phantom limb pain. -using oxycodone prn to avoid excessive tylenol 7/24- pain controlled- taking meds q6 hours prn-  4. Mood/Behavior/Sleep: Provide emotional support             -antipsychotic agents: N/A 5. Neuropsych/cognition: This patient is capable of making decisions on her own behalf. 6. Skin/Wound Care: still some serous drainage. Continue stump sock  - convert to shrinker soon when drainage is a little less. Pt has shrinker in room already 7. Fluids/Electrolytes/Nutrition:  encourage PO  -Potassium 3.8 7/21-->  continue to supplement  -protein supp for low albumin    8.  Iron deficiency anemia.  Continue iron supplement.     -hgb 9.2 7/18 9.  Tobacco abuse.  NicoDerm patch.  Provide counseling 10.  Type II diabetes mellitus.  Latest hemoglobin A1c 5.1.  SSI.  Well controlled. CBG's discontinued  7/24- change diet to regular pt request 11.  Hypertension.   Norvasc 5 mg and HCTZ 25 mg daily.  Monitor with increased mobility  7/23-amlodipine increased to 10mg  7/21---with improvement  7/24- BP controlled- cont' regimen    LOS: 8 days A FACE TO FACE EVALUATION WAS PERFORMED  Jocelyn [name] 02/02/2022, 8:39 AM

## 2022-02-02 NOTE — Progress Notes (Signed)
Inpatient Rehabilitation Discharge Medication Review by a Pharmacist  A complete drug regimen review was completed for this patient to identify any potential clinically significant medication issues.  High Risk Drug Classes Is patient taking? Indication by Medication  Antipsychotic No   Anticoagulant Yes Apixaban- DVT  Antibiotic No   Opioid Yes oxyIR- acute pain  Antiplatelet No   Hypoglycemics/insulin No   Vasoactive Medication Yes Norvasc, HCTZ- HTN  Chemotherapy No   Other Yes Gabapentin- neuropathic pain Trazodone- sleep Robaxin- muscle spasms     Type of Medication Issue Identified Description of Issue Recommendation(s)  Drug Interaction(s) (clinically significant)     Duplicate Therapy     Allergy     No Medication Administration End Date     Incorrect Dose     Additional Drug Therapy Needed     Significant med changes from prior encounter (inform family/care partners about these prior to discharge).    Other       Clinically significant medication issues were identified that warrant physician communication and completion of prescribed/recommended actions by midnight of the next day:  No  Name of provider notified for urgent issues identified:   Provider Method of Notification:     Pharmacist comments:   Time spent performing this drug regimen review (minutes):  30   Ikram Riebe BS, PharmD, BCPS Clinical Pharmacist 02/02/2022 12:30 PM  Contact: [phone] after 3 PM  "Be curious, not judgmental..." -Debbora Dus

## 2022-02-04 ENCOUNTER — Other Ambulatory Visit (INDEPENDENT_AMBULATORY_CARE_PROVIDER_SITE_OTHER): Payer: Self-pay | Admitting: Vascular Surgery

## 2022-02-04 DIAGNOSIS — Z89512 Acquired absence of left leg below knee: Secondary | ICD-10-CM

## 2022-02-04 DIAGNOSIS — Z9889 Other specified postprocedural states: Secondary | ICD-10-CM

## 2022-02-05 ENCOUNTER — Encounter (INDEPENDENT_AMBULATORY_CARE_PROVIDER_SITE_OTHER): Payer: Self-pay | Admitting: Nurse Practitioner

## 2022-02-05 ENCOUNTER — Ambulatory Visit (INDEPENDENT_AMBULATORY_CARE_PROVIDER_SITE_OTHER): Payer: Self-pay

## 2022-02-05 ENCOUNTER — Ambulatory Visit (INDEPENDENT_AMBULATORY_CARE_PROVIDER_SITE_OTHER): Payer: MEDICAID | Admitting: Nurse Practitioner

## 2022-02-05 VITALS — BP 123/80 | HR 98 | Resp 16

## 2022-02-05 DIAGNOSIS — Z9889 Other specified postprocedural states: Secondary | ICD-10-CM

## 2022-02-05 DIAGNOSIS — I739 Peripheral vascular disease, unspecified: Secondary | ICD-10-CM

## 2022-02-05 DIAGNOSIS — E119 Type 2 diabetes mellitus without complications: Secondary | ICD-10-CM

## 2022-02-05 DIAGNOSIS — Z89512 Acquired absence of left leg below knee: Secondary | ICD-10-CM

## 2022-02-05 NOTE — Progress Notes (Signed)
Subjective:    Patient ID: Jocelyn [name], female    DOB: May 05, 1981, 41 y.o.   MRN: [id number] Chief Complaint  Patient presents with   Follow-up    [facility] 2 week follow up    Jocelyn [name] is a 41 year old female who recently underwent multiple interventions for her left lower extremity.  On 01/11/2020 she underwent angioplasty and subsequently again on 01/18/2022.  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  Patient's symptoms largely started when she stopped her Eliquis.  She also notes that she had a COVID shot several days before the symptoms began.  Today, the patient's left above-knee amputation is doing well.  Currently no dehiscence.  There is some oozing but the stump is still swollen from the previous known DVT.  She is compliant on her Eliquis currently.  Today the patient has ABI 0.71 on the right she has a TBI 0.55.  She has biphasic tibial artery waveforms with slightly decreased toe waveforms.    Review of Systems  Skin:  Positive for wound.  All other systems reviewed and are negative.      Objective:   Physical Exam Vitals reviewed.  HENT:     Head: Normocephalic.  Cardiovascular:     Rate and Rhythm: Normal rate.     Pulses:          Dorsalis pedis pulses are detected w/ Doppler on the right side.       Posterior tibial pulses are detected w/ Doppler on the right side.  Pulmonary:     Effort: Pulmonary effort is normal.  Musculoskeletal:     Left Lower Extremity: Left leg is amputated above knee.  Neurological:     Mental Status: She is alert and oriented to person, place, and time.  Psychiatric:        Mood and Affect: Mood normal.        Behavior: Behavior normal.        Thought Content: Thought content normal.        Judgment: Judgment normal.     BP 123/80 (BP Location: Right Arm)   Pulse 98   Resp 16   LMP 01/05/2022 (Exact Date)   Past Medical History:   Diagnosis Date   IDA (iron deficiency anemia) 12/17/2019    Social History   Socioeconomic History   Marital status: Legally Separated    Spouse name: Not on file   Number of children: Not on file   Years of education: Not on file   Highest education level: Not on file  Occupational History   Not on file  Tobacco Use   Smoking status: Every Day    Packs/day: 0.50    Types: Cigarettes   Smokeless tobacco: Never  Vaping Use   Vaping Use: Never used  Substance and Sexual Activity   Alcohol use: Yes   Drug use: Never   Sexual activity: Not Currently  Other Topics Concern   Not on file  Social History Narrative   Not on file   Social Determinants of Health   Financial Resource Strain: Not on file  Food Insecurity: Not on file  Transportation Needs: Not on file  Physical Activity: Not on file  Stress: Not on file  Social Connections: Not on file  Intimate Partner Violence: Not on file    Past Surgical History:  Procedure Laterality Date   AMPUTATION Left 01/20/2022   Procedure: AMPUTATION ABOVE KNEE;  Surgeon:  Algernon Huxley, MD;  Location: [facility];  Service: Vascular;  Laterality: Left;   APPENDECTOMY     LOWER EXTREMITY ANGIOGRAPHY Left 01/11/2020   Procedure: Lower Extremity Angiography;  Surgeon: Algernon Huxley, MD;  Location: [place] [facility];  Service: Cardiovascular;  Laterality: Left;   LOWER EXTREMITY ANGIOGRAPHY Left 03/06/2020   Procedure: Lower Extremity Angiography;  Surgeon: Algernon Huxley, MD;  Location: [place] [facility];  Service: Cardiovascular;  Laterality: Left;   LOWER EXTREMITY ANGIOGRAPHY Left 03/07/2020   Procedure: Lower Extremity Angiography;  Surgeon: Algernon Huxley, MD;  Location: [place] [facility];  Service: Cardiovascular;  Laterality: Left;   LOWER EXTREMITY ANGIOGRAPHY Left 01/06/2022   Procedure: Lower Extremity Angiography;  Surgeon: Algernon Huxley, MD;  Location: [place] [facility];  Service: Cardiovascular;  Laterality: Left;   LOWER  EXTREMITY ANGIOGRAPHY Left 01/07/2022   Procedure: Lower Extremity Angiography;  Surgeon: Algernon Huxley, MD;  Location: [employer];  Service: Cardiovascular;  Laterality: Left;   LOWER EXTREMITY ANGIOGRAPHY Left 01/18/2022   Procedure: Lower Extremity Angiography;  Surgeon: Algernon Huxley, MD;  Location: [place] [facility];  Service: Cardiovascular;  Laterality: Left;   LOWER EXTREMITY INTERVENTION Bilateral 01/17/2022   Procedure: LOWER EXTREMITY INTERVENTION;  Surgeon: Zara Chess, MD;  Location: [place] [facility];  Service: Cardiovascular;  Laterality: Bilateral;   PERIPHERAL VASCULAR THROMBECTOMY Left 01/20/2022   Procedure: PERIPHERAL VASCULAR THROMBECTOMY Left Femoral vein;  Surgeon: Algernon Huxley, MD;  Location: [facility];  Service: Vascular;  Laterality: Left;    Family History  Problem Relation Age of Onset   Hypertension Mother    Hypertension Sister    Hypertension Brother     No Known Allergies     Latest Ref Rng & Units 02/01/2022    8:07 AM 01/26/2022    5:36 AM 01/25/2022    5:37 AM  CBC  WBC 4.0 - 10.5 K/uL 6.9  5.3  4.8   Hemoglobin 12.0 - 15.0 g/dL 10.9  9.2  9.3   Hematocrit 36.0 - 46.0 % 36.5  29.8  31.7   Platelets 150 - 400 K/uL 514  466  474       CMP     Component Value Date/Time   NA 139 02/01/2022 0807   NA 134 (L) 04/24/2013 1557   K 3.7 02/01/2022 0807   K 4.4 04/24/2013 1557   CL 105 02/01/2022 0807   CL 105 04/24/2013 1557   CO2 24 02/01/2022 0807   CO2 24 04/24/2013 1557   GLUCOSE 128 (H) 02/01/2022 0807   GLUCOSE 103 (H) 04/24/2013 1557   BUN 13 02/01/2022 0807   BUN 7 04/24/2013 1557   CREATININE 0.66 02/01/2022 0807   CREATININE 0.51 (L) 04/24/2013 1557   CALCIUM 10.3 02/01/2022 0807   CALCIUM 9.1 04/24/2013 1557   PROT 5.6 (L) 01/26/2022 0536   PROT 8.1 04/24/2013 1557   ALBUMIN 2.2 (L) 01/26/2022 0536   ALBUMIN 4.1 04/24/2013 1557   AST 26 01/26/2022 0536   AST 43 (H) 04/24/2013 1557   ALT 23 01/26/2022 0536   ALT 29  04/24/2013 1557   ALKPHOS 102 01/26/2022 0536   ALKPHOS 81 04/24/2013 1557   BILITOT 0.4 01/26/2022 0536   BILITOT 0.4 04/24/2013 1557   GFRNONAA >60 02/01/2022 0807   GFRNONAA >60 04/24/2013 1557   GFRAA >60 03/09/2020 0438   GFRAA >60 04/24/2013 1557     No results found.  Assessment & Plan:   1. Peripheral arterial disease with history of revascularization [facility]) Today the patient does have some noted peripheral arterial disease in her right lower extremity.  At this time it is stable and so currently there is no indication for intervention.  We will continue to maintain close follow-up with the patient's right lower extremity PAD. - VAS [place] ABI WITH/WO TBI  2. S/P BKA (below knee amputation), left (HCC) The patient's wound is healing well.  It is intact.  We removed half of the staples.  Steri-Strips applied.  Patient will return in 1 to 2 weeks for further staple removal. - VAS [place] ABI WITH/WO TBI  3. Diabetes mellitus without complication (HCC) Continue hypoglycemic medications as already ordered, these medications have been reviewed and there are no changes at this time.  Hgb A1C to be monitored as already arranged by primary service    Current Outpatient Medications on File Prior to Visit  Medication Sig Dispense Refill   acetaminophen (TYLENOL) 325 MG tablet Take 1-2 tablets (325-650 mg total) by mouth every 4 (four) hours as needed for mild pain.     amLODipine (NORVASC) 10 MG tablet Take 1 tablet (10 mg total) by mouth daily. 30 tablet 0   apixaban (ELIQUIS) 5 MG TABS tablet Take 1 tablet (5 mg total) by mouth 2 (two) times daily. 60 tablet 1   Ferrous Sulfate (IRON) 325 (65 Fe) MG TABS Take 1 tablet (325 mg total) by mouth daily. 60 tablet 0   gabapentin (NEURONTIN) 600 MG tablet Take 1 tablet (600 mg total) by mouth 3 (three) times daily. 90 tablet 0   hydrochlorothiazide (HYDRODIURIL) 25 MG tablet Take 1 tablet (25 mg total) by mouth daily. 30 tablet 0    methocarbamol (ROBAXIN) 500 MG tablet Take 1 tablet (500 mg total) by mouth every 6 (six) hours as needed for muscle spasms. 60 tablet 0   oxyCODONE (OXY IR/ROXICODONE) 5 MG immediate release tablet Take 1 tablet (5 mg total) by mouth every 6 (six) hours as needed for severe pain. 28 tablet 0   potassium chloride SA (KLOR-CON M) 20 MEQ tablet Take 1 tablet (20 mEq total) by mouth daily. 30 tablet 0   traZODone (DESYREL) 50 MG tablet Take 0.5-1 tablets (25-50 mg total) by mouth at bedtime as needed for sleep. 10 tablet 0   nicotine (NICODERM CQ - DOSED IN MG/24 HOURS) 21 mg/24hr patch Place 1 patch (21 mg total) onto the skin daily. (Patient not taking: Reported on 02/05/2022) 28 patch 0   No current facility-administered medications on file prior to visit.    There are no Patient Instructions on file for this visit. No follow-ups on file.   Georgiana Spinner, NP

## 2022-02-09 DIAGNOSIS — Z993 Dependence on wheelchair: Secondary | ICD-10-CM | POA: Insufficient documentation

## 2022-02-09 DIAGNOSIS — Z89612 Acquired absence of left leg above knee: Secondary | ICD-10-CM | POA: Insufficient documentation

## 2022-02-19 ENCOUNTER — Ambulatory Visit (INDEPENDENT_AMBULATORY_CARE_PROVIDER_SITE_OTHER): Payer: MEDICAID | Admitting: Nurse Practitioner

## 2022-02-19 ENCOUNTER — Encounter (INDEPENDENT_AMBULATORY_CARE_PROVIDER_SITE_OTHER): Payer: Self-pay | Admitting: Nurse Practitioner

## 2022-02-19 VITALS — BP 136/94 | HR 108 | Resp 18 | Ht 65.0 in | Wt 170.0 lb

## 2022-02-19 DIAGNOSIS — Z89612 Acquired absence of left leg above knee: Secondary | ICD-10-CM

## 2022-02-20 ENCOUNTER — Encounter (INDEPENDENT_AMBULATORY_CARE_PROVIDER_SITE_OTHER): Payer: Self-pay | Admitting: Nurse Practitioner

## 2022-02-20 NOTE — Progress Notes (Signed)
Subjective:    Patient ID: Jocelyn [name], female    DOB: [birth year], 41 y.o.   MRN: [id number] Chief Complaint  Patient presents with   Follow-up    1-2 week staple removal    Jocelyn [name] is a 41 year old female who recently underwent multiple interventions for her left lower extremity.  On 01/11/2020 she underwent angioplasty and subsequently again on 01/18/2022.  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  Patient's symptoms largely started when she stopped her Eliquis.  She also notes that she had a COVID shot several days before the symptoms began.  Today, the patient's left above-knee amputation is doing well.  Currently no dehiscence.  She remains compliant on her Eliquis.  There has been no dehiscence of the wound from previous staple removal.  She does note that she has fallen on the stump.    Review of Systems  Cardiovascular:  Positive for leg swelling.  Skin:  Positive for wound.  All other systems reviewed and are negative.      Objective:   Physical Exam Vitals reviewed.  HENT:     Head: Normocephalic.  Cardiovascular:     Rate and Rhythm: Normal rate.  Pulmonary:     Effort: Pulmonary effort is normal.  Musculoskeletal:     Left Lower Extremity: Left leg is amputated above knee.  Skin:    General: Skin is warm and dry.  Neurological:     Mental Status: She is alert and oriented to person, place, and time.     Motor: Weakness present.  Psychiatric:        Mood and Affect: Mood normal.        Behavior: Behavior normal.        Thought Content: Thought content normal.        Judgment: Judgment normal.     BP (!) 136/94 (BP Location: Right Arm)   Pulse (!) 108   Resp 18   Ht 5\' 5"  (1.651 m)   Wt 170 lb (77.1 kg)   BMI 28.29 kg/m   Past Medical History:  Diagnosis Date   IDA (iron deficiency anemia) 12/17/2019    Social History   Socioeconomic History   Marital  status: Legally Separated    Spouse name: Not on file   Number of children: Not on file   Years of education: Not on file   Highest education level: Not on file  Occupational History   Not on file  Tobacco Use   Smoking status: Every Day    Packs/day: 0.50    Types: Cigarettes   Smokeless tobacco: Never  Vaping Use   Vaping Use: Never used  Substance and Sexual Activity   Alcohol use: Yes   Drug use: Never   Sexual activity: Not Currently  Other Topics Concern   Not on file  Social History Narrative   Not on file   Social Determinants of Health   Financial Resource Strain: Not on file  Food Insecurity: Not on file  Transportation Needs: Not on file  Physical Activity: Not on file  Stress: Not on file  Social Connections: Not on file  Intimate Partner Violence: Not on file    Past Surgical History:  Procedure Laterality Date   AMPUTATION Left 01/20/2022   Procedure: AMPUTATION ABOVE KNEE;  Surgeon: 03/23/2022, MD;  Location: [facility];  Service: Vascular;  Laterality: Left;   APPENDECTOMY     LOWER EXTREMITY  ANGIOGRAPHY Left 01/11/2020   Procedure: Lower Extremity Angiography;  Surgeon: Annice Needy, MD;  Location: [facility];  Service: Cardiovascular;  Laterality: Left;   LOWER EXTREMITY ANGIOGRAPHY Left 03/06/2020   Procedure: Lower Extremity Angiography;  Surgeon: Annice Needy, MD;  Location: [facility];  Service: Cardiovascular;  Laterality: Left;   LOWER EXTREMITY ANGIOGRAPHY Left 03/07/2020   Procedure: Lower Extremity Angiography;  Surgeon: Annice Needy, MD;  Location: [facility];  Service: Cardiovascular;  Laterality: Left;   LOWER EXTREMITY ANGIOGRAPHY Left 01/06/2022   Procedure: Lower Extremity Angiography;  Surgeon: Annice Needy, MD;  Location: [facility];  Service: Cardiovascular;  Laterality: Left;   LOWER EXTREMITY ANGIOGRAPHY Left 01/07/2022   Procedure: Lower Extremity Angiography;  Surgeon: Annice Needy, MD;  Location:  [facility];  Service: Cardiovascular;  Laterality: Left;   LOWER EXTREMITY ANGIOGRAPHY Left 01/18/2022   Procedure: Lower Extremity Angiography;  Surgeon: Annice Needy, MD;  Location: [facility];  Service: Cardiovascular;  Laterality: Left;   LOWER EXTREMITY INTERVENTION Bilateral 01/17/2022   Procedure: LOWER EXTREMITY INTERVENTION;  Surgeon: Learta Codding, MD;  Location: [facility];  Service: Cardiovascular;  Laterality: Bilateral;   PERIPHERAL VASCULAR THROMBECTOMY Left 01/20/2022   Procedure: PERIPHERAL VASCULAR THROMBECTOMY Left Femoral vein;  Surgeon: Annice Needy, MD;  Location: [facility];  Service: Vascular;  Laterality: Left;    Family History  Problem Relation Age of Onset   Hypertension Mother    Hypertension Sister    Hypertension Brother     No Known Allergies     Latest Ref Rng & Units 02/01/2022    8:07 AM 01/26/2022    5:36 AM 01/25/2022    5:37 AM  CBC  WBC 4.0 - 10.5 K/uL 6.9  5.3  4.8   Hemoglobin 12.0 - 15.0 g/dL 78.2  9.2  9.3   Hematocrit 36.0 - 46.0 % 36.5  29.8  31.7   Platelets 150 - 400 K/uL 514  466  474       CMP     Component Value Date/Time   NA 139 02/01/2022 0807   NA 134 (L) 04/24/2013 1557   K 3.7 02/01/2022 0807   K 4.4 04/24/2013 1557   CL 105 02/01/2022 0807   CL 105 04/24/2013 1557   CO2 24 02/01/2022 0807   CO2 24 04/24/2013 1557   GLUCOSE 128 (H) 02/01/2022 0807   GLUCOSE 103 (H) 04/24/2013 1557   BUN 13 02/01/2022 0807   BUN 7 04/24/2013 1557   CREATININE 0.66 02/01/2022 0807   CREATININE 0.51 (L) 04/24/2013 1557   CALCIUM 10.3 02/01/2022 0807   CALCIUM 9.1 04/24/2013 1557   PROT 5.6 (L) 01/26/2022 0536   PROT 8.1 04/24/2013 1557   ALBUMIN 2.2 (L) 01/26/2022 0536   ALBUMIN 4.1 04/24/2013 1557   AST 26 01/26/2022 0536   AST 43 (H) 04/24/2013 1557   ALT 23 01/26/2022 0536   ALT 29 04/24/2013 1557   ALKPHOS 102 01/26/2022 0536   ALKPHOS 81 04/24/2013 1557   BILITOT 0.4 01/26/2022 0536   BILITOT  0.4 04/24/2013 1557   GFRNONAA >60 02/01/2022 0807   GFRNONAA >60 04/24/2013 1557   GFRAA >60 03/09/2020 0438   GFRAA >60 04/24/2013 1557     VAS [place] ABI WITH/WO TBI  Result Date: 02/08/2022  LOWER EXTREMITY DOPPLER STUDY Patient Name:  Jocelyn [name]  Date of Exam:   02/05/2022 Medical Rec #:  [id number]       Accession #:    [id number] Date of Birth: 02/27/1981        Patient Gender: F Patient Age:   74 years Exam Location:  [facility] Procedure:      VAS [place] ABI WITH/WO TBI Referring Phys: [name] --------------------------------------------------------------------------------  Indications: Peripheral artery disease.  Vascular Interventions: 01/11/2020: Aortogram and selective Left Lower Extremity                         Angiogram including selective Imaging of the Posterior                         Tibial Artery. PTA of the Left PTA and Tibioperoneal                         trunk. PTA of the Left Popliteal Artery and SFA.                         Mechanical Thrombectomy of the Left SFA, Popliteal                         Artery, Tibioperoneal trunk and Posterior Tibial Artery.                         Viabahn covered stent placement to the most distal SFA,                         Popliteal Artery and Tibioperoneal Trunk.                          01/18/2022: Left Lower Extremity Angiogram. Mechanical                         Thrombectomy of the Left SFA, Popliteal                         Artery,Tibioperoneal Trunk and Proximal Posterior Tibial                         Artery with the Penumbra CAT 6 device. PTA of the Left                         PTA and Tibioperoneal Trunk trunk with 2.5 mm diameter                         by 30 cm length angioplasty Balloon. PTA of the Left SFA                         and Popliteal Artery with 2 inflations with a 5 mm                         diameter by 30 cm length Lutonix drug coated angioplasty                         balloon.  01/20/2022:  Lt AKA. Comparison Study: 01/23/2020 Performing Technologist: Debbe Bales RVS  Examination Guidelines: A complete evaluation includes at minimum, Doppler waveform signals and systolic blood pressure reading at the level of bilateral brachial, anterior tibial, and posterior tibial arteries, when vessel segments are accessible. Bilateral testing is considered an integral part of a complete examination. Photoelectric Plethysmograph (PPG) waveforms and toe systolic pressure readings are included as required and additional duplex testing as needed. Limited examinations for reoccurring indications may be performed as noted.  ABI Findings: +---------+------------------+-----+--------+--------+ Right    Rt Pressure (mmHg)IndexWaveformComment  +---------+------------------+-----+--------+--------+ Brachial 157                                     +---------+------------------+-----+--------+--------+ ATA      85                0.54 biphasic         +---------+------------------+-----+--------+--------+ PTA      112               0.71 biphasic         +---------+------------------+-----+--------+--------+ Great Toe86                0.55 Abnormal         +---------+------------------+-----+--------+--------+ +--------+------------------+-----+--------+-------+ Left    Lt Pressure (mmHg)IndexWaveformComment +--------+------------------+-----+--------+-------+ [id number]                                    +--------+------------------+-----+--------+-------+ ATA                                    Lt AKA  +--------+------------------+-----+--------+-------+ +-------+-----------+-----------+------------+------------+ ABI/TBIToday's ABIToday's TBIPrevious ABIPrevious TBI +-------+-----------+-----------+------------+------------+ Right  .71        .55        .87         .65          +-------+-----------+-----------+------------+------------+ Left   Lt AKA                 1.10                     +-------+-----------+-----------+------------+------------+ Compared to prior study on 01/23/2020.  Summary: Right: Resting right ankle-brachial index indicates moderate right lower extremity arterial disease. The right toe-brachial index is abnormal. Left: Lt AKA. *See table(s) above for measurements and observations.   Electronically signed by [name] MD on 02/08/2022 at 4:07:27 PM.    Final        Assessment & Plan:   1. S/P AKA (above knee amputation) unilateral, left (HCC) The patient's wound is continuing to heal well.  She does have some significant dry skin likely related to the significant swelling that she previously had.  She notes that she has an upcoming intake meeting with [facility].  We will have the patient return in 2 weeks to reevaluate wound and ensure wound healing.  Current Outpatient Medications on File Prior to Visit  Medication Sig Dispense Refill   acetaminophen (TYLENOL) 325 MG tablet Take 1-2 tablets (325-650 mg total) by mouth every 4 (four) hours as needed for mild pain.     amLODipine (NORVASC) 10 MG tablet Take 1 tablet (10 mg total) by mouth daily. 30 tablet 0   apixaban (ELIQUIS) 5 MG  TABS tablet Take 1 tablet (5 mg total) by mouth 2 (two) times daily. 60 tablet 1   Ferrous Sulfate (IRON) 325 (65 Fe) MG TABS Take 1 tablet (325 mg total) by mouth daily. 60 tablet 0   gabapentin (NEURONTIN) 600 MG tablet Take 1 tablet (600 mg total) by mouth 3 (three) times daily. 90 tablet 0   hydrochlorothiazide (HYDRODIURIL) 25 MG tablet Take 1 tablet (25 mg total) by mouth daily. 30 tablet 0   methocarbamol (ROBAXIN) 500 MG tablet Take 1 tablet (500 mg total) by mouth every 6 (six) hours as needed for muscle spasms. 60 tablet 0   nicotine (NICODERM CQ - DOSED IN MG/24 HOURS) 21 mg/24hr patch Place 1 patch (21 mg total) onto the skin daily. 28 patch 0   oxyCODONE (OXY IR/ROXICODONE) 5 MG immediate release tablet Take 1 tablet (5 mg total) by  mouth every 6 (six) hours as needed for severe pain. 28 tablet 0   potassium chloride SA (KLOR-CON M) 20 MEQ tablet Take 1 tablet (20 mEq total) by mouth daily. 30 tablet 0   traZODone (DESYREL) 50 MG tablet Take 0.5-1 tablets (25-50 mg total) by mouth at bedtime as needed for sleep. 10 tablet 0   No current facility-administered medications on file prior to visit.    There are no Patient Instructions on file for this visit. No follow-ups on file.   Kris Hartmann, NP

## 2022-02-25 ENCOUNTER — Encounter: Payer: Self-pay | Admitting: Vascular Surgery

## 2022-03-04 ENCOUNTER — Encounter: Payer: Medicaid Other | Attending: Physical Medicine & Rehabilitation | Admitting: Physical Medicine & Rehabilitation

## 2022-03-05 ENCOUNTER — Ambulatory Visit (INDEPENDENT_AMBULATORY_CARE_PROVIDER_SITE_OTHER): Payer: MEDICAID | Admitting: Nurse Practitioner

## 2022-03-05 ENCOUNTER — Encounter (INDEPENDENT_AMBULATORY_CARE_PROVIDER_SITE_OTHER): Payer: Self-pay | Admitting: Nurse Practitioner

## 2022-03-05 VITALS — BP 138/90 | HR 109 | Resp 18 | Ht 65.0 in | Wt 170.0 lb

## 2022-03-05 DIAGNOSIS — Z89612 Acquired absence of left leg above knee: Secondary | ICD-10-CM

## 2022-03-05 MED ORDER — MUPIROCIN 2 % EX OINT: 1.0000 | TOPICAL_OINTMENT | Freq: Every day | CUTANEOUS | 2 refills | Status: AC

## 2022-03-05 NOTE — Progress Notes (Unsigned)
Subjective:    Patient ID: Jocelyn [name], female    DOB: 12-02-1980, 41 y.o.   MRN: [id number] Chief Complaint  Patient presents with   Follow-up    2 week wound check     Jocelyn [name] is a 41 year old female who recently underwent multiple interventions for her left lower extremity.  On 01/11/2020 she underwent angioplasty and subsequently again on 01/18/2022.  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  Patient's symptoms largely started when she stopped her Eliquis.  She also notes that she had a COVID shot several days before the symptoms began.  Today, the patient's left above-knee amputation is doing well.  Currently no dehiscence.  She remains compliant on her Eliquis.  There has been no dehiscence of the wound from previous staple removal.  She does note that she has fallen on the stump.  There is only a small shallow open area at the midportion.    Review of Systems  Musculoskeletal:  Positive for gait problem.  All other systems reviewed and are negative.      Objective:   Physical Exam Vitals reviewed.  HENT:     Head: Normocephalic.  Cardiovascular:     Rate and Rhythm: Normal rate.  Pulmonary:     Effort: Pulmonary effort is normal.  Musculoskeletal:     Left Lower Extremity: Left leg is amputated above knee.  Skin:    General: Skin is warm and dry.  Neurological:     Mental Status: She is alert and oriented to person, place, and time.  Psychiatric:        Mood and Affect: Mood normal.        Behavior: Behavior normal.        Thought Content: Thought content normal.        Judgment: Judgment normal.     BP (!) 138/90 (BP Location: Left Arm)   Pulse (!) 109   Resp 18   Ht 5\' 5"  (1.651 m)   Wt 170 lb (77.1 kg)   BMI 28.29 kg/m   Past Medical History:  Diagnosis Date   IDA (iron deficiency anemia) 12/17/2019    Social History   Socioeconomic History   Marital  status: Legally Separated    Spouse name: Not on file   Number of children: Not on file   Years of education: Not on file   Highest education level: Not on file  Occupational History   Not on file  Tobacco Use   Smoking status: Every Day    Packs/day: 0.50    Types: Cigarettes   Smokeless tobacco: Never  Vaping Use   Vaping Use: Never used  Substance and Sexual Activity   Alcohol use: Yes   Drug use: Never   Sexual activity: Not Currently  Other Topics Concern   Not on file  Social History Narrative   Not on file   Social Determinants of Health   Financial Resource Strain: Not on file  Food Insecurity: Not on file  Transportation Needs: Not on file  Physical Activity: Not on file  Stress: Not on file  Social Connections: Not on file  Intimate Partner Violence: Not on file    Past Surgical History:  Procedure Laterality Date   AMPUTATION Left 01/20/2022   Procedure: AMPUTATION ABOVE KNEE;  Surgeon: 03/23/2022, MD;  Location: [facility];  Service: Vascular;  Laterality: Left;   APPENDECTOMY     LOWER EXTREMITY  ANGIOGRAPHY Left 01/11/2020   Procedure: Lower Extremity Angiography;  Surgeon: Annice Needy, MD;  Location: [facility];  Service: Cardiovascular;  Laterality: Left;   LOWER EXTREMITY ANGIOGRAPHY Left 03/06/2020   Procedure: Lower Extremity Angiography;  Surgeon: Annice Needy, MD;  Location: [facility];  Service: Cardiovascular;  Laterality: Left;   LOWER EXTREMITY ANGIOGRAPHY Left 03/07/2020   Procedure: Lower Extremity Angiography;  Surgeon: Annice Needy, MD;  Location: [facility];  Service: Cardiovascular;  Laterality: Left;   LOWER EXTREMITY ANGIOGRAPHY Left 01/06/2022   Procedure: Lower Extremity Angiography;  Surgeon: Annice Needy, MD;  Location: [facility];  Service: Cardiovascular;  Laterality: Left;   LOWER EXTREMITY ANGIOGRAPHY Left 01/07/2022   Procedure: Lower Extremity Angiography;  Surgeon: Annice Needy, MD;  Location:  [facility];  Service: Cardiovascular;  Laterality: Left;   LOWER EXTREMITY ANGIOGRAPHY Left 01/18/2022   Procedure: Lower Extremity Angiography;  Surgeon: Annice Needy, MD;  Location: [facility];  Service: Cardiovascular;  Laterality: Left;   LOWER EXTREMITY INTERVENTION Bilateral 01/17/2022   Procedure: LOWER EXTREMITY INTERVENTION;  Surgeon: Learta Codding, MD;  Location: [facility];  Service: Cardiovascular;  Laterality: Bilateral;   PERIPHERAL VASCULAR THROMBECTOMY Left 01/20/2022   Procedure: PERIPHERAL VASCULAR THROMBECTOMY Left Femoral vein;  Surgeon: Annice Needy, MD;  Location: [facility];  Service: Vascular;  Laterality: Left;    Family History  Problem Relation Age of Onset   Hypertension Mother    Hypertension Sister    Hypertension Brother     No Known Allergies     Latest Ref Rng & Units 02/01/2022    8:07 AM 01/26/2022    5:36 AM 01/25/2022    5:37 AM  CBC  WBC 4.0 - 10.5 K/uL 6.9  5.3  4.8   Hemoglobin 12.0 - 15.0 g/dL 78.2  9.2  9.3   Hematocrit 36.0 - 46.0 % 36.5  29.8  31.7   Platelets 150 - 400 K/uL 514  466  474       CMP     Component Value Date/Time   NA 139 02/01/2022 0807   NA 134 (L) 04/24/2013 1557   K 3.7 02/01/2022 0807   K 4.4 04/24/2013 1557   CL 105 02/01/2022 0807   CL 105 04/24/2013 1557   CO2 24 02/01/2022 0807   CO2 24 04/24/2013 1557   GLUCOSE 128 (H) 02/01/2022 0807   GLUCOSE 103 (H) 04/24/2013 1557   BUN 13 02/01/2022 0807   BUN 7 04/24/2013 1557   CREATININE 0.66 02/01/2022 0807   CREATININE 0.51 (L) 04/24/2013 1557   CALCIUM 10.3 02/01/2022 0807   CALCIUM 9.1 04/24/2013 1557   PROT 5.6 (L) 01/26/2022 0536   PROT 8.1 04/24/2013 1557   ALBUMIN 2.2 (L) 01/26/2022 0536   ALBUMIN 4.1 04/24/2013 1557   AST 26 01/26/2022 0536   AST 43 (H) 04/24/2013 1557   ALT 23 01/26/2022 0536   ALT 29 04/24/2013 1557   ALKPHOS 102 01/26/2022 0536   ALKPHOS 81 04/24/2013 1557   BILITOT 0.4 01/26/2022 0536   BILITOT  0.4 04/24/2013 1557   GFRNONAA >60 02/01/2022 0807   GFRNONAA >60 04/24/2013 1557   GFRAA >60 03/09/2020 0438   GFRAA >60 04/24/2013 1557     VAS [place] ABI WITH/WO TBI  Result Date: 02/08/2022  LOWER EXTREMITY DOPPLER STUDY Patient Name:  [name]  Date of Exam:   02/05/2022 Medical Rec #:  [id number]       Accession #:    [id number] Date of Birth: 02/27/1981        Patient Gender: F Patient Age:   74 years Exam Location:  [facility] Procedure:      VAS [place] ABI WITH/WO TBI Referring Phys: [name] --------------------------------------------------------------------------------  Indications: Peripheral artery disease.  Vascular Interventions: 01/11/2020: Aortogram and selective Left Lower Extremity                         Angiogram including selective Imaging of the Posterior                         Tibial Artery. PTA of the Left PTA and Tibioperoneal                         trunk. PTA of the Left Popliteal Artery and SFA.                         Mechanical Thrombectomy of the Left SFA, Popliteal                         Artery, Tibioperoneal trunk and Posterior Tibial Artery.                         Viabahn covered stent placement to the most distal SFA,                         Popliteal Artery and Tibioperoneal Trunk.                          01/18/2022: Left Lower Extremity Angiogram. Mechanical                         Thrombectomy of the Left SFA, Popliteal                         Artery,Tibioperoneal Trunk and Proximal Posterior Tibial                         Artery with the Penumbra CAT 6 device. PTA of the Left                         PTA and Tibioperoneal Trunk trunk with 2.5 mm diameter                         by 30 cm length angioplasty Balloon. PTA of the Left SFA                         and Popliteal Artery with 2 inflations with a 5 mm                         diameter by 30 cm length Lutonix drug coated angioplasty                         balloon.  01/20/2022:  Lt AKA. Comparison Study: 01/23/2020 Performing Technologist: Debbe BalesSolomon Mcclary RVS  Examination Guidelines: A complete evaluation includes at minimum, Doppler waveform signals and systolic blood pressure reading at the level of bilateral brachial, anterior tibial, and posterior tibial arteries, when vessel segments are accessible. Bilateral testing is considered an integral part of a complete examination. Photoelectric Plethysmograph (PPG) waveforms and toe systolic pressure readings are included as required and additional duplex testing as needed. Limited examinations for reoccurring indications may be performed as noted.  ABI Findings: +---------+------------------+-----+--------+--------+ Right    Rt Pressure (mmHg)IndexWaveformComment  +---------+------------------+-----+--------+--------+ Brachial 157                                     +---------+------------------+-----+--------+--------+ ATA      85                0.54 biphasic         +---------+------------------+-----+--------+--------+ PTA      112               0.71 biphasic         +---------+------------------+-----+--------+--------+ Great Toe86                0.55 Abnormal         +---------+------------------+-----+--------+--------+ +--------+------------------+-----+--------+-------+ Left    Lt Pressure (mmHg)IndexWaveformComment +--------+------------------+-----+--------+-------+ [id number]                                    +--------+------------------+-----+--------+-------+ ATA                                    Lt AKA  +--------+------------------+-----+--------+-------+ +-------+-----------+-----------+------------+------------+ ABI/TBIToday's ABIToday's TBIPrevious ABIPrevious TBI +-------+-----------+-----------+------------+------------+ Right  .71        .55        .87         .65          +-------+-----------+-----------+------------+------------+ Left   Lt AKA                 1.10                     +-------+-----------+-----------+------------+------------+ Compared to prior study on 01/23/2020.  Summary: Right: Resting right ankle-brachial index indicates moderate right lower extremity arterial disease. The right toe-brachial index is abnormal. Left: Lt AKA. *See table(s) above for measurements and observations.   Electronically signed by Festus BarrenJason Dew MD on 02/08/2022 at 4:07:27 PM.    Final        Assessment & Plan:   1. S/P AKA (above knee amputation) unilateral, left (HCC) The patient's wound has healed well.  She has been utilizing her shrinker.  There is still some dry skin due to the decreased swelling.  There is just a small shallow area of ulceration.  Patient advised to use compressive ointment with a Band-Aid over it.  She can continue shrinker.  We will have her return in 4 weeks to ensure the wound is healing.   Current Outpatient Medications on File Prior to Visit  Medication Sig Dispense Refill   acetaminophen (TYLENOL) 325 MG tablet Take 1-2 tablets (325-650 mg total) by mouth every 4 (four) hours as needed for mild pain.     amLODipine (NORVASC) 10 MG tablet Take 1 tablet (10 mg total)  by mouth daily. 30 tablet 0   apixaban (ELIQUIS) 5 MG TABS tablet Take 1 tablet (5 mg total) by mouth 2 (two) times daily. 60 tablet 1   Ferrous Sulfate (IRON) 325 (65 Fe) MG TABS Take 1 tablet (325 mg total) by mouth daily. 60 tablet 0   gabapentin (NEURONTIN) 600 MG tablet Take 1 tablet (600 mg total) by mouth 3 (three) times daily. 90 tablet 0   methocarbamol (ROBAXIN) 500 MG tablet Take 1 tablet (500 mg total) by mouth every 6 (six) hours as needed for muscle spasms. 60 tablet 0   nicotine (NICODERM CQ - DOSED IN MG/24 HOURS) 21 mg/24hr patch Place 1 patch (21 mg total) onto the skin daily. 28 patch 0   oxyCODONE (OXY IR/ROXICODONE) 5 MG immediate release tablet Take 1 tablet (5 mg total) by mouth every 6 (six) hours as needed for severe pain. 28 tablet 0    potassium chloride SA (KLOR-CON M) 20 MEQ tablet Take 1 tablet (20 mEq total) by mouth daily. 30 tablet 0   traZODone (DESYREL) 50 MG tablet Take 0.5-1 tablets (25-50 mg total) by mouth at bedtime as needed for sleep. 10 tablet 0   hydrochlorothiazide (HYDRODIURIL) 25 MG tablet Take 1 tablet (25 mg total) by mouth daily. 30 tablet 0   No current facility-administered medications on file prior to visit.    There are no Patient Instructions on file for this visit. No follow-ups on file.   Georgiana Spinner, NP

## 2022-03-06 ENCOUNTER — Encounter (INDEPENDENT_AMBULATORY_CARE_PROVIDER_SITE_OTHER): Payer: Self-pay | Admitting: Nurse Practitioner

## 2022-04-02 ENCOUNTER — Ambulatory Visit (INDEPENDENT_AMBULATORY_CARE_PROVIDER_SITE_OTHER): Payer: Medicaid Other | Admitting: Nurse Practitioner

## 2022-04-02 ENCOUNTER — Encounter (INDEPENDENT_AMBULATORY_CARE_PROVIDER_SITE_OTHER): Payer: Self-pay | Admitting: Nurse Practitioner

## 2022-04-02 VITALS — BP 150/90 | HR 112 | Resp 18

## 2022-04-02 DIAGNOSIS — Z9889 Other specified postprocedural states: Secondary | ICD-10-CM | POA: Diagnosis not present

## 2022-04-02 DIAGNOSIS — I10 Essential (primary) hypertension: Secondary | ICD-10-CM

## 2022-04-02 DIAGNOSIS — I739 Peripheral vascular disease, unspecified: Secondary | ICD-10-CM

## 2022-04-02 DIAGNOSIS — Z89612 Acquired absence of left leg above knee: Secondary | ICD-10-CM | POA: Diagnosis not present

## 2022-04-03 NOTE — Progress Notes (Signed)
Subjective:    Patient ID: Jocelyn [name], female    DOB: 1981/04/23, 41 y.o.   MRN: [id number] No chief complaint on file.   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  Today the wound is completely healed.  The patient currently has a shrinker and it is shaping well.  The patient is eager to proceed getting her prosthetic.    Review of Systems  Musculoskeletal:  Positive for gait problem.  All other systems reviewed and are negative.      Objective:   Physical Exam Vitals reviewed.  HENT:     Head: Normocephalic.  Cardiovascular:     Rate and Rhythm: Normal rate.  Pulmonary:     Effort: Pulmonary effort is normal.  Musculoskeletal:     Right Lower Extremity: Right leg is amputated above knee.  Skin:    General: Skin is warm and dry.  Neurological:     Mental Status: She is alert and oriented to person, place, and time.  Psychiatric:        Mood and Affect: Mood normal.        Behavior: Behavior normal.        Thought Content: Thought content normal.        Judgment: Judgment normal.     BP (!) 150/90 (BP Location: Left Arm)   Pulse (!) 112   Resp 18   Past Medical History:  Diagnosis Date   IDA (iron deficiency anemia) 12/17/2019    Social History   Socioeconomic History   Marital status: Legally Separated    Spouse name: Not on file   Number of children: Not on file   Years of education: Not on file   Highest education level: Not on file  Occupational History   Not on file  Tobacco Use   Smoking status: Every Day    Packs/day: 0.50    Types: Cigarettes   Smokeless tobacco: Never  Vaping Use   Vaping Use: Never used  Substance and Sexual Activity   Alcohol use: Yes   Drug use: Never   Sexual activity: Not Currently  Other Topics Concern   Not on file  Social History Narrative   Not on file   Social Determinants of Health   Financial Resource Strain: Not on file  Food Insecurity: Not on file   Transportation Needs: Not on file  Physical Activity: Not on file  Stress: Not on file  Social Connections: Not on file  Intimate Partner Violence: Not on file    Past Surgical History:  Procedure Laterality Date   AMPUTATION Left 01/20/2022   Procedure: AMPUTATION ABOVE KNEE;  Surgeon: Annice Needy, MD;  Location: [facility];  Service: Vascular;  Laterality: Left;   APPENDECTOMY     LOWER EXTREMITY ANGIOGRAPHY Left 01/11/2020   Procedure: Lower Extremity Angiography;  Surgeon: Annice Needy, MD;  Location: [facility];  Service: Cardiovascular;  Laterality: Left;   LOWER EXTREMITY ANGIOGRAPHY Left 03/06/2020   Procedure: Lower Extremity Angiography;  Surgeon: Annice Needy, MD;  Location: [facility];  Service: Cardiovascular;  Laterality: Left;   LOWER EXTREMITY ANGIOGRAPHY Left 03/07/2020   Procedure: Lower Extremity Angiography;  Surgeon: Annice Needy, MD;  Location: [facility];  Service: Cardiovascular;  Laterality: Left;   LOWER EXTREMITY ANGIOGRAPHY Left 01/06/2022   Procedure: Lower Extremity Angiography;  Surgeon: Annice Needy, MD;  Location: [facility];  Service: Cardiovascular;  Laterality:  Left;   LOWER EXTREMITY ANGIOGRAPHY Left 01/07/2022   Procedure: Lower Extremity Angiography;  Surgeon: Annice Needy, MD;  Location: [facility];  Service: Cardiovascular;  Laterality: Left;   LOWER EXTREMITY ANGIOGRAPHY Left 01/18/2022   Procedure: Lower Extremity Angiography;  Surgeon: Annice Needy, MD;  Location: [facility];  Service: Cardiovascular;  Laterality: Left;   LOWER EXTREMITY INTERVENTION Bilateral 01/17/2022   Procedure: LOWER EXTREMITY INTERVENTION;  Surgeon: Learta Codding, MD;  Location: [facility];  Service: Cardiovascular;  Laterality: Bilateral;   PERIPHERAL VASCULAR THROMBECTOMY Left 01/20/2022   Procedure: PERIPHERAL VASCULAR THROMBECTOMY Left Femoral vein;  Surgeon: Annice Needy, MD;  Location: [facility];  Service:  Vascular;  Laterality: Left;    Family History  Problem Relation Age of Onset   Hypertension Mother    Hypertension Sister    Hypertension Brother     No Known Allergies     Latest Ref Rng & Units 02/01/2022    8:07 AM 01/26/2022    5:36 AM 01/25/2022    5:37 AM  CBC  WBC 4.0 - 10.5 K/uL 6.9  5.3  4.8   Hemoglobin 12.0 - 15.0 g/dL 18.5  9.2  9.3   Hematocrit 36.0 - 46.0 % 36.5  29.8  31.7   Platelets 150 - 400 K/uL 514  466  474       CMP     Component Value Date/Time   NA 139 02/01/2022 0807   NA 134 (L) 04/24/2013 1557   K 3.7 02/01/2022 0807   K 4.4 04/24/2013 1557   CL 105 02/01/2022 0807   CL 105 04/24/2013 1557   CO2 24 02/01/2022 0807   CO2 24 04/24/2013 1557   GLUCOSE 128 (H) 02/01/2022 0807   GLUCOSE 103 (H) 04/24/2013 1557   BUN 13 02/01/2022 0807   BUN 7 04/24/2013 1557   CREATININE 0.66 02/01/2022 0807   CREATININE 0.51 (L) 04/24/2013 1557   CALCIUM 10.3 02/01/2022 0807   CALCIUM 9.1 04/24/2013 1557   PROT 5.6 (L) 01/26/2022 0536   PROT 8.1 04/24/2013 1557   ALBUMIN 2.2 (L) 01/26/2022 0536   ALBUMIN 4.1 04/24/2013 1557   AST 26 01/26/2022 0536   AST 43 (H) 04/24/2013 1557   ALT 23 01/26/2022 0536   ALT 29 04/24/2013 1557   ALKPHOS 102 01/26/2022 0536   ALKPHOS 81 04/24/2013 1557   BILITOT 0.4 01/26/2022 0536   BILITOT 0.4 04/24/2013 1557   GFRNONAA >60 02/01/2022 0807   GFRNONAA >60 04/24/2013 1557   GFRAA >60 03/09/2020 0438   GFRAA >60 04/24/2013 1557     VAS [place] ABI WITH/WO TBI  Result Date: 02/08/2022  LOWER EXTREMITY DOPPLER STUDY Patient Name:  Jocelyn [name]  Date of Exam:   02/05/2022 Medical Rec #: [id number]       Accession #:    [id number] Date of Birth: [birth year]        Patient Gender: F Patient Age:   50 years Exam Location:  [facility] Procedure:      VAS [place] ABI WITH/WO TBI Referring Phys: [name] --------------------------------------------------------------------------------  Indications: Peripheral artery disease.   Vascular Interventions: 01/11/2020: Aortogram and selective Left Lower Extremity                         Angiogram including selective Imaging of the Posterior  Tibial Artery. PTA of the Left PTA and Tibioperoneal                         trunk. PTA of the Left Popliteal Artery and SFA.                         Mechanical Thrombectomy of the Left SFA, Popliteal                         Artery, Tibioperoneal trunk and Posterior Tibial Artery.                         Viabahn covered stent placement to the most distal SFA,                         Popliteal Artery and Tibioperoneal Trunk.                          01/18/2022: Left Lower Extremity Angiogram. Mechanical                         Thrombectomy of the Left SFA, Popliteal                         Artery,Tibioperoneal Trunk and Proximal Posterior Tibial                         Artery with the Penumbra CAT 6 device. PTA of the Left                         PTA and Tibioperoneal Trunk trunk with 2.5 mm diameter                         by 30 cm length angioplasty Balloon. PTA of the Left SFA                         and Popliteal Artery with 2 inflations with a 5 mm                         diameter by 30 cm length Lutonix drug coated angioplasty                         balloon.                          01/20/2022: Lt AKA. Comparison Study: 01/23/2020 Performing Technologist: Debbe Bales RVS  Examination Guidelines: A complete evaluation includes at minimum, Doppler waveform signals and systolic blood pressure reading at the level of bilateral brachial, anterior tibial, and posterior tibial arteries, when vessel segments are accessible. Bilateral testing is considered an integral part of a complete examination. Photoelectric Plethysmograph (PPG) waveforms and toe systolic pressure readings are included as required and additional duplex testing as needed. Limited examinations for reoccurring indications may be performed as noted.  ABI Findings:  +---------+------------------+-----+--------+--------+ Right    Rt Pressure (mmHg)IndexWaveformComment  +---------+------------------+-----+--------+--------+ Brachial 157                                     +---------+------------------+-----+--------+--------+  ATA      85                0.54 biphasic         +---------+------------------+-----+--------+--------+ PTA      112               0.71 biphasic         +---------+------------------+-----+--------+--------+ Great Toe86                0.55 Abnormal         +---------+------------------+-----+--------+--------+ +--------+------------------+-----+--------+-------+ Left    Lt Pressure (mmHg)IndexWaveformComment +--------+------------------+-----+--------+-------+ [id number]                                    +--------+------------------+-----+--------+-------+ ATA                                    Lt AKA  +--------+------------------+-----+--------+-------+ +-------+-----------+-----------+------------+------------+ ABI/TBIToday's ABIToday's TBIPrevious ABIPrevious TBI +-------+-----------+-----------+------------+------------+ Right  .71        .55        .87         .65          +-------+-----------+-----------+------------+------------+ Left   Lt AKA                1.10                     +-------+-----------+-----------+------------+------------+ Compared to prior study on 01/23/2020.  Summary: Right: Resting right ankle-brachial index indicates moderate right lower extremity arterial disease. The right toe-brachial index is abnormal. Left: Lt AKA. *See table(s) above for measurements and observations.   Electronically signed by Leotis Pain MD on 02/08/2022 at 4:07:27 PM.    Final        Assessment & Plan:   1. S/P AKA (above knee amputation) unilateral, left ([place]) The patient was seen for further evaluation for left above knee prosthesis.  She is a woman who had amputation on  01/20/2022.  At the time he is well healed and ready for fitting of new above knee prosthesis.  He is a highly motivated individual and should do well once fitted with prosthesis.  She has no problem returning to a K3 ambulator, which will allow him to walk inside and outside of his home and overload level barriers.   2. Primary hypertension Continue antihypertensive medications as already ordered, these medications have been reviewed and there are no changes at this time.   3. Peripheral arterial disease with history of revascularization [facility]) The patient does have known peripheral arterial disease of the right lower extremity.  We will have her return in 3 months with ABIs.   Current Outpatient Medications on File Prior to Visit  Medication Sig Dispense Refill   acetaminophen (TYLENOL) 325 MG tablet Take 1-2 tablets (325-650 mg total) by mouth every 4 (four) hours as needed for mild pain.     amLODipine (NORVASC) 10 MG tablet Take 1 tablet (10 mg total) by mouth daily. 30 tablet 0   apixaban (ELIQUIS) 5 MG TABS tablet Take 1 tablet (5 mg total) by mouth 2 (two) times daily. 60 tablet 1   Ferrous Sulfate (IRON) 325 (65 Fe) MG TABS Take 1 tablet (325 mg total) by mouth daily. 60 tablet 0   gabapentin (NEURONTIN) 600 MG tablet Take 1 tablet (600 mg total) by mouth  3 (three) times daily. 90 tablet 0   methocarbamol (ROBAXIN) 500 MG tablet Take 1 tablet (500 mg total) by mouth every 6 (six) hours as needed for muscle spasms. 60 tablet 0   mupirocin ointment (BACTROBAN) 2 % Apply 1 Application topically daily. 30 g 2   nicotine (NICODERM CQ - DOSED IN MG/24 HOURS) 21 mg/24hr patch Place 1 patch (21 mg total) onto the skin daily. 28 patch 0   oxyCODONE (OXY IR/ROXICODONE) 5 MG immediate release tablet Take 1 tablet (5 mg total) by mouth every 6 (six) hours as needed for severe pain. 28 tablet 0   potassium chloride SA (KLOR-CON M) 20 MEQ tablet Take 1 tablet (20 mEq total) by mouth daily. 30 tablet 0    traZODone (DESYREL) 50 MG tablet Take 0.5-1 tablets (25-50 mg total) by mouth at bedtime as needed for sleep. 10 tablet 0   hydrochlorothiazide (HYDRODIURIL) 25 MG tablet Take 1 tablet (25 mg total) by mouth daily. 30 tablet 0   No current facility-administered medications on file prior to visit.    There are no Patient Instructions on file for this visit. No follow-ups on file.   Georgiana SpinnerFallon E Ousman Dise, NP

## 2022-04-26 DIAGNOSIS — Z0131 Encounter for examination of blood pressure with abnormal findings: Secondary | ICD-10-CM | POA: Insufficient documentation

## 2022-05-10 ENCOUNTER — Encounter (INDEPENDENT_AMBULATORY_CARE_PROVIDER_SITE_OTHER): Payer: Self-pay

## 2022-05-27 ENCOUNTER — Telehealth (INDEPENDENT_AMBULATORY_CARE_PROVIDER_SITE_OTHER): Payer: Self-pay | Admitting: Vascular Surgery

## 2022-05-27 NOTE — Telephone Encounter (Signed)
Tried to call pt no answer

## 2022-05-27 NOTE — Telephone Encounter (Signed)
Pt. Called in regards to paperwork to be filled out for her prosthetic leg.  Please advise.

## 2022-05-27 NOTE — Telephone Encounter (Signed)
Spoke with pt and made her aware that the paperwork was faxed over this morning.

## 2022-06-23 ENCOUNTER — Telehealth (INDEPENDENT_AMBULATORY_CARE_PROVIDER_SITE_OTHER): Payer: Self-pay

## 2022-06-23 NOTE — Telephone Encounter (Signed)
Do they need a physical prescription or can we send a referral to the outpatient [facility] clinic?

## 2022-06-25 ENCOUNTER — Other Ambulatory Visit (INDEPENDENT_AMBULATORY_CARE_PROVIDER_SITE_OTHER): Payer: Self-pay | Admitting: Nurse Practitioner

## 2022-06-25 DIAGNOSIS — Z89612 Acquired absence of left leg above knee: Secondary | ICD-10-CM

## 2022-06-25 NOTE — Telephone Encounter (Signed)
Referral can be sent to [facility] outpatient

## 2022-06-25 NOTE — Telephone Encounter (Signed)
Referral placed.

## 2022-06-30 DIAGNOSIS — Z719 Counseling, unspecified: Secondary | ICD-10-CM | POA: Insufficient documentation

## 2022-06-30 DIAGNOSIS — G546 Phantom limb syndrome with pain: Secondary | ICD-10-CM | POA: Insufficient documentation

## 2022-06-30 DIAGNOSIS — G4700 Insomnia, unspecified: Secondary | ICD-10-CM | POA: Insufficient documentation

## 2022-07-01 ENCOUNTER — Other Ambulatory Visit (INDEPENDENT_AMBULATORY_CARE_PROVIDER_SITE_OTHER): Payer: Self-pay | Admitting: Vascular Surgery

## 2022-07-01 DIAGNOSIS — I739 Peripheral vascular disease, unspecified: Secondary | ICD-10-CM

## 2022-07-01 DIAGNOSIS — E785 Hyperlipidemia, unspecified: Secondary | ICD-10-CM | POA: Insufficient documentation

## 2022-07-02 ENCOUNTER — Ambulatory Visit (INDEPENDENT_AMBULATORY_CARE_PROVIDER_SITE_OTHER): Payer: Medicaid Other | Admitting: Nurse Practitioner

## 2022-07-02 ENCOUNTER — Ambulatory Visit (INDEPENDENT_AMBULATORY_CARE_PROVIDER_SITE_OTHER): Payer: Medicaid Other

## 2022-07-02 ENCOUNTER — Encounter (INDEPENDENT_AMBULATORY_CARE_PROVIDER_SITE_OTHER): Payer: Self-pay | Admitting: Vascular Surgery

## 2022-07-02 VITALS — BP 133/91 | HR 106 | Resp 18 | Ht 65.0 in | Wt 160.0 lb

## 2022-07-02 DIAGNOSIS — Z9889 Other specified postprocedural states: Secondary | ICD-10-CM | POA: Diagnosis not present

## 2022-07-02 DIAGNOSIS — I739 Peripheral vascular disease, unspecified: Secondary | ICD-10-CM

## 2022-07-02 DIAGNOSIS — Z89612 Acquired absence of left leg above knee: Secondary | ICD-10-CM

## 2022-07-02 DIAGNOSIS — I10 Essential (primary) hypertension: Secondary | ICD-10-CM

## 2022-07-18 ENCOUNTER — Encounter (INDEPENDENT_AMBULATORY_CARE_PROVIDER_SITE_OTHER): Payer: Self-pay | Admitting: Nurse Practitioner

## 2022-07-18 NOTE — Progress Notes (Signed)
Subjective:    Patient ID: Jocelyn [name] Human, female    DOB: [birth year], 42 y.o.   MRN: [id number] Chief Complaint  Patient presents with   Follow-up    ultrasound     The patient returns today for evaluation of her peripheral arterial disease as well as her left above-knee amputation.  She has recently received a prosthetic and about to begin working with physical therapy.  She is eager to begin.  The patient does have known peripheral arterial disease on her remaining limb.  She denies any significant claudication or rest pain.  There are no open wounds or ulcerations.  Today her ABI 0.69 with a TBI 0.52 this is relatively unchanged from previous ABI 0.71 on the right and 0.55 TBI with monophasic tibial artery waveforms.    Review of Systems  Musculoskeletal:  Positive for gait problem.  All other systems reviewed and are negative.      Objective:   Physical Exam Vitals reviewed.  HENT:     Head: Normocephalic.  Cardiovascular:     Rate and Rhythm: Normal rate.  Pulmonary:     Effort: Pulmonary effort is normal.  Musculoskeletal:     Left Lower Extremity: Left leg is amputated above knee.  Skin:    General: Skin is warm and dry.  Neurological:     Mental Status: She is alert and oriented to person, place, and time.     Gait: Gait abnormal.  Psychiatric:        Mood and Affect: Mood normal.        Behavior: Behavior normal.        Thought Content: Thought content normal.        Judgment: Judgment normal.     BP (!) 133/91 (BP Location: Right Arm)   Pulse (!) 106   Resp 18   Ht 5\' 5"  (1.651 m)   Wt 160 lb (72.6 kg)   BMI 26.63 kg/m   Past Medical History:  Diagnosis Date   IDA (iron deficiency anemia) 12/17/2019    Social History   Socioeconomic History   Marital status: Legally Separated    Spouse name: Not on file   Number of children: Not on file   Years of education: Not on file   Highest education level: Not on file  Occupational History   Not on  file  Tobacco Use   Smoking status: Every Day    Packs/day: 0.50    Types: Cigarettes   Smokeless tobacco: Never  Vaping Use   Vaping Use: Never used  Substance and Sexual Activity   Alcohol use: Yes   Drug use: Never   Sexual activity: Not Currently  Other Topics Concern   Not on file  Social History Narrative   Not on file   Social Determinants of Health   Financial Resource Strain: Not on file  Food Insecurity: Not on file  Transportation Needs: Not on file  Physical Activity: Not on file  Stress: Not on file  Social Connections: Not on file  Intimate Partner Violence: Not on file    Past Surgical History:  Procedure Laterality Date   AMPUTATION Left 01/20/2022   Procedure: AMPUTATION ABOVE KNEE;  Surgeon: Algernon Huxley, MD;  Location: [facility];  Service: Vascular;  Laterality: Left;   APPENDECTOMY     LOWER EXTREMITY ANGIOGRAPHY Left 01/11/2020   Procedure: Lower Extremity Angiography;  Surgeon: Algernon Huxley, MD;  Location: [facility];  Service: Cardiovascular;  Laterality:  Left;   LOWER EXTREMITY ANGIOGRAPHY Left 03/06/2020   Procedure: Lower Extremity Angiography;  Surgeon: Annice Needy, MD;  Location: [facility];  Service: Cardiovascular;  Laterality: Left;   LOWER EXTREMITY ANGIOGRAPHY Left 03/07/2020   Procedure: Lower Extremity Angiography;  Surgeon: Annice Needy, MD;  Location: [facility];  Service: Cardiovascular;  Laterality: Left;   LOWER EXTREMITY ANGIOGRAPHY Left 01/06/2022   Procedure: Lower Extremity Angiography;  Surgeon: Annice Needy, MD;  Location: [facility];  Service: Cardiovascular;  Laterality: Left;   LOWER EXTREMITY ANGIOGRAPHY Left 01/07/2022   Procedure: Lower Extremity Angiography;  Surgeon: Annice Needy, MD;  Location: [facility];  Service: Cardiovascular;  Laterality: Left;   LOWER EXTREMITY ANGIOGRAPHY Left 01/18/2022   Procedure: Lower Extremity Angiography;  Surgeon: Annice Needy, MD;  Location:  [facility];  Service: Cardiovascular;  Laterality: Left;   LOWER EXTREMITY INTERVENTION Bilateral 01/17/2022   Procedure: LOWER EXTREMITY INTERVENTION;  Surgeon: Learta Codding, MD;  Location: [facility];  Service: Cardiovascular;  Laterality: Bilateral;   PERIPHERAL VASCULAR THROMBECTOMY Left 01/20/2022   Procedure: PERIPHERAL VASCULAR THROMBECTOMY Left Femoral vein;  Surgeon: Annice Needy, MD;  Location: [facility];  Service: Vascular;  Laterality: Left;    Family History  Problem Relation Age of Onset   Hypertension Mother    Hypertension Sister    Hypertension Brother     No Known Allergies     Latest Ref Rng & Units 02/01/2022    8:07 AM 01/26/2022    5:36 AM 01/25/2022    5:37 AM  CBC  WBC 4.0 - 10.5 K/uL 6.9  5.3  4.8   Hemoglobin 12.0 - 15.0 g/dL 19.6  9.2  9.3   Hematocrit 36.0 - 46.0 % 36.5  29.8  31.7   Platelets 150 - 400 K/uL 514  466  474       CMP     Component Value Date/Time   NA 139 02/01/2022 0807   NA 134 (L) 04/24/2013 1557   K 3.7 02/01/2022 0807   K 4.4 04/24/2013 1557   CL 105 02/01/2022 0807   CL 105 04/24/2013 1557   CO2 24 02/01/2022 0807   CO2 24 04/24/2013 1557   GLUCOSE 128 (H) 02/01/2022 0807   GLUCOSE 103 (H) 04/24/2013 1557   BUN 13 02/01/2022 0807   BUN 7 04/24/2013 1557   CREATININE 0.66 02/01/2022 0807   CREATININE 0.51 (L) 04/24/2013 1557   CALCIUM 10.3 02/01/2022 0807   CALCIUM 9.1 04/24/2013 1557   PROT 5.6 (L) 01/26/2022 0536   PROT 8.1 04/24/2013 1557   ALBUMIN 2.2 (L) 01/26/2022 0536   ALBUMIN 4.1 04/24/2013 1557   AST 26 01/26/2022 0536   AST 43 (H) 04/24/2013 1557   ALT 23 01/26/2022 0536   ALT 29 04/24/2013 1557   ALKPHOS 102 01/26/2022 0536   ALKPHOS 81 04/24/2013 1557   BILITOT 0.4 01/26/2022 0536   BILITOT 0.4 04/24/2013 1557   GFRNONAA >60 02/01/2022 0807   GFRNONAA >60 04/24/2013 1557   GFRAA >60 03/09/2020 0438   GFRAA >60 04/24/2013 1557     VAS [place] ABI WITH/WO TBI  Result Date:  02/08/2022  LOWER EXTREMITY DOPPLER STUDY Patient Name:  Jocelyn [name]  Date of Exam:   02/05/2022 Medical Rec #: [id number]       Accession #:    [id number] Date of Birth: Nov 04, 1980        Patient  Gender: F Patient Age:   63 years Exam Location:  [facility] Procedure:      VAS [place] ABI WITH/WO TBI Referring Phys: [name] --------------------------------------------------------------------------------  Indications: Peripheral artery disease.  Vascular Interventions: 01/11/2020: Aortogram and selective Left Lower Extremity                         Angiogram including selective Imaging of the Posterior                         Tibial Artery. PTA of the Left PTA and Tibioperoneal                         trunk. PTA of the Left Popliteal Artery and SFA.                         Mechanical Thrombectomy of the Left SFA, Popliteal                         Artery, Tibioperoneal trunk and Posterior Tibial Artery.                         Viabahn covered stent placement to the most distal SFA,                         Popliteal Artery and Tibioperoneal Trunk.                          01/18/2022: Left Lower Extremity Angiogram. Mechanical                         Thrombectomy of the Left SFA, Popliteal                         Artery,Tibioperoneal Trunk and Proximal Posterior Tibial                         Artery with the Penumbra CAT 6 device. PTA of the Left                         PTA and Tibioperoneal Trunk trunk with 2.5 mm diameter                         by 30 cm length angioplasty Balloon. PTA of the Left SFA                         and Popliteal Artery with 2 inflations with a 5 mm                         diameter by 30 cm length Lutonix drug coated angioplasty                         balloon.                          01/20/2022: Lt AKA. Comparison Study: 01/23/2020 Performing Technologist: Debbe Bales RVS  Examination Guidelines: A complete evaluation includes at minimum, Doppler waveform  signals and systolic  blood pressure reading at the level of bilateral brachial, anterior tibial, and posterior tibial arteries, when vessel segments are accessible. Bilateral testing is considered an integral part of a complete examination. Photoelectric Plethysmograph (PPG) waveforms and toe systolic pressure readings are included as required and additional duplex testing as needed. Limited examinations for reoccurring indications may be performed as noted.  ABI Findings: +---------+------------------+-----+--------+--------+ Right    Rt Pressure (mmHg)IndexWaveformComment  +---------+------------------+-----+--------+--------+ Brachial 157                                     +---------+------------------+-----+--------+--------+ ATA      85                0.54 biphasic         +---------+------------------+-----+--------+--------+ PTA      112               0.71 biphasic         +---------+------------------+-----+--------+--------+ Great Toe86                0.55 Abnormal         +---------+------------------+-----+--------+--------+ +--------+------------------+-----+--------+-------+ Left    Lt Pressure (mmHg)IndexWaveformComment +--------+------------------+-----+--------+-------+ [id number]                                    +--------+------------------+-----+--------+-------+ ATA                                    Lt AKA  +--------+------------------+-----+--------+-------+ +-------+-----------+-----------+------------+------------+ ABI/TBIToday's ABIToday's TBIPrevious ABIPrevious TBI +-------+-----------+-----------+------------+------------+ Right  .71        .55        .87         .65          +-------+-----------+-----------+------------+------------+ Left   Lt AKA                1.10                     +-------+-----------+-----------+------------+------------+ Compared to prior study on 01/23/2020.  Summary: Right: Resting right ankle-brachial index  indicates moderate right lower extremity arterial disease. The right toe-brachial index is abnormal. Left: Lt AKA. *See table(s) above for measurements and observations.   Electronically signed by [name] MD on 02/08/2022 at 4:07:27 PM.    Final        Assessment & Plan:   1. S/P AKA (above knee amputation) unilateral, left (HCC) The patient will continue with physical therapy.  Patient will follow-up if there are issues with her residual limb. 2. Primary hypertension Continue antihypertensive medications as already ordered, these medications have been reviewed and there are no changes at this time.   3. Peripheral arterial disease with history of revascularization (HCC)  Recommend:  The patient has evidence of atherosclerosis of the lower extremities with claudication.  The patient does not voice lifestyle limiting changes at this point in time.  Noninvasive studies do not suggest clinically significant change.  No invasive studies, angiography or surgery at this time The patient should continue walking and begin a more formal exercise program.  The patient should continue antiplatelet therapy and aggressive treatment of the lipid abnormalities  No changes in the patient's medications at this time  Continued surveillance is indicated as atherosclerosis  is likely to progress with time.    The patient will continue follow up with noninvasive studies as ordered.  The patient follow-up in 6 months or sooner if issues arise.   Current Outpatient Medications on File Prior to Visit  Medication Sig Dispense Refill   acetaminophen (TYLENOL) 325 MG tablet Take 1-2 tablets (325-650 mg total) by mouth every 4 (four) hours as needed for mild pain.     amLODipine (NORVASC) 10 MG tablet Take 1 tablet (10 mg total) by mouth daily. 30 tablet 0   apixaban (ELIQUIS) 5 MG TABS tablet Take 1 tablet (5 mg total) by mouth 2 (two) times daily. 60 tablet 1   gabapentin (NEURONTIN) 600 MG tablet Take 1  tablet (600 mg total) by mouth 3 (three) times daily. 90 tablet 0   methocarbamol (ROBAXIN) 500 MG tablet Take 1 tablet (500 mg total) by mouth every 6 (six) hours as needed for muscle spasms. 60 tablet 0   mupirocin ointment (BACTROBAN) 2 % Apply 1 Application topically daily. 30 g 2   nicotine (NICODERM CQ - DOSED IN MG/24 HOURS) 21 mg/24hr patch Place 1 patch (21 mg total) onto the skin daily. 28 patch 0   oxyCODONE (OXY IR/ROXICODONE) 5 MG immediate release tablet Take 1 tablet (5 mg total) by mouth every 6 (six) hours as needed for severe pain. 28 tablet 0   potassium chloride SA (KLOR-CON M) 20 MEQ tablet Take 1 tablet (20 mEq total) by mouth daily. 30 tablet 0   traZODone (DESYREL) 50 MG tablet Take 0.5-1 tablets (25-50 mg total) by mouth at bedtime as needed for sleep. 10 tablet 0   Ferrous Sulfate (IRON) 325 (65 Fe) MG TABS Take 1 tablet (325 mg total) by mouth daily. 60 tablet 0   hydrochlorothiazide (HYDRODIURIL) 25 MG tablet Take 1 tablet (25 mg total) by mouth daily. 30 tablet 0   No current facility-administered medications on file prior to visit.    There are no Patient Instructions on file for this visit. No follow-ups on file.   Georgiana Spinner, NP

## 2022-07-20 ENCOUNTER — Ambulatory Visit: Payer: Medicaid Other | Attending: Nurse Practitioner | Admitting: Physical Therapy

## 2022-07-20 DIAGNOSIS — R2689 Other abnormalities of gait and mobility: Secondary | ICD-10-CM | POA: Diagnosis not present

## 2022-07-20 DIAGNOSIS — M6281 Muscle weakness (generalized): Secondary | ICD-10-CM | POA: Diagnosis present

## 2022-07-20 DIAGNOSIS — Z89612 Acquired absence of left leg above knee: Secondary | ICD-10-CM | POA: Diagnosis present

## 2022-07-20 DIAGNOSIS — R269 Unspecified abnormalities of gait and mobility: Secondary | ICD-10-CM | POA: Diagnosis present

## 2022-07-20 NOTE — Therapy (Addendum)
OUTPATIENT PHYSICAL THERAPY LOWER EXTREMITY EVALUATION   Patient Name: Jocelyn [name] MRN: [id number] DOB:19-Dec-1980, 42 y.o., female Today's Date: 07/20/2022  END OF SESSION:  PT End of Session - 07/20/22 0756     Visit Number 1    Number of Visits 17    Date for PT Re-Evaluation 09/14/22    PT Start Time 0756    PT Stop Time 0911    PT Time Calculation (min) 75 min             Past Medical History:  Diagnosis Date   IDA (iron deficiency anemia) 12/17/2019   Past Surgical History:  Procedure Laterality Date   AMPUTATION Left 01/20/2022   Procedure: AMPUTATION ABOVE KNEE;  Surgeon: Annice Needy, MD;  Location: [facility];  Service: Vascular;  Laterality: Left;   APPENDECTOMY     LOWER EXTREMITY ANGIOGRAPHY Left 01/11/2020   Procedure: Lower Extremity Angiography;  Surgeon: Annice Needy, MD;  Location: [facility];  Service: Cardiovascular;  Laterality: Left;   LOWER EXTREMITY ANGIOGRAPHY Left 03/06/2020   Procedure: Lower Extremity Angiography;  Surgeon: Annice Needy, MD;  Location: [facility];  Service: Cardiovascular;  Laterality: Left;   LOWER EXTREMITY ANGIOGRAPHY Left 03/07/2020   Procedure: Lower Extremity Angiography;  Surgeon: Annice Needy, MD;  Location: [facility];  Service: Cardiovascular;  Laterality: Left;   LOWER EXTREMITY ANGIOGRAPHY Left 01/06/2022   Procedure: Lower Extremity Angiography;  Surgeon: Annice Needy, MD;  Location: [facility];  Service: Cardiovascular;  Laterality: Left;   LOWER EXTREMITY ANGIOGRAPHY Left 01/07/2022   Procedure: Lower Extremity Angiography;  Surgeon: Annice Needy, MD;  Location: [facility];  Service: Cardiovascular;  Laterality: Left;   LOWER EXTREMITY ANGIOGRAPHY Left 01/18/2022   Procedure: Lower Extremity Angiography;  Surgeon: Annice Needy, MD;  Location: [facility];  Service: Cardiovascular;  Laterality: Left;   LOWER EXTREMITY INTERVENTION Bilateral 01/17/2022   Procedure:  LOWER EXTREMITY INTERVENTION;  Surgeon: Learta Codding, MD;  Location: [facility];  Service: Cardiovascular;  Laterality: Bilateral;   PERIPHERAL VASCULAR THROMBECTOMY Left 01/20/2022   Procedure: PERIPHERAL VASCULAR THROMBECTOMY Left Femoral vein;  Surgeon: Annice Needy, MD;  Location: [facility];  Service: Vascular;  Laterality: Left;   Patient Active Problem List   Diagnosis Date Noted   S/P AKA (above knee amputation) unilateral, left (HCC) 01/25/2022   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   Acute urinary retention 01/19/2022   Fever 01/18/2022   Acute deep vein thrombosis (DVT) of left femoral vein (HCC) 01/17/2022   Hypomagnesemia 01/06/2022   Hyponatremia 01/06/2022   Critical limb ischemia of left lower extremity (HCC) 01/10/2020   Diabetes mellitus without complication (HCC) 12/17/2019   IDA (iron deficiency anemia) 12/17/2019   HTN (hypertension) 12/17/2019    PCP: [facility]  REFERRING PROVIDER: Georgiana Spinner, NP  REFERRING DIAG: S/P AKA (above knee amputation) unilateral, left (HCC)  THERAPY DIAG:  S/P AKA (above knee amputation) unilateral, left (HCC) - Plan: PT plan of care cert/re-cert  Muscle weakness (generalized) - Plan: PT plan of care cert/re-cert  Gait difficulty - Plan: PT plan of care cert/re-cert  Rationale for Evaluation and Treatment: Rehabilitation  ONSET DATE: 01/2023  SUBJECTIVE:   SUBJECTIVE STATEMENT: Pt. Reports having increase blood clots after receiving Covid vaccine 3 years ago. Pt. Had multiple surgeries resulting in L AKA on 01/20/22.  Pt. Received prosthetic leg 1 month ago from [facility].  Pt. Was delayed with receiving leg secondary to insurance issues.  Pt. Has not walked with prosthetic leg at this time.  No c/o pain but phantom limb symptoms.  Pt. Has not been compliant with donning shrinker or HEP.    PERTINENT HISTORY: 1. S/P AKA (above knee amputation) unilateral, left ([place]) The patient will continue  with physical therapy.  Patient will follow-up if there are issues with her residual limb. 2. Primary hypertension Continue antihypertensive medications as already ordered, these medications have been reviewed and there are no changes at this time.    3. Peripheral arterial disease with history of revascularization (HCC)  Recommend:   The patient has evidence of atherosclerosis of the lower extremities with claudication.  The patient does not voice lifestyle limiting changes at this point in time.   Noninvasive studies do not suggest clinically significant change.   No invasive studies, angiography or surgery at this time The patient should continue walking and begin a more formal exercise program.  The patient should continue antiplatelet therapy and aggressive treatment of the lipid abnormalities   No changes in the patient's medications at this time   Continued surveillance is indicated as atherosclerosis is likely to progress with time.     The patient will continue follow up with noninvasive studies as ordered.  The patient follow-up in 6 months or sooner if issues arise.           Current Outpatient Medications on File Prior to Visit  Medication Sig Dispense Refill   acetaminophen (TYLENOL) 325 MG tablet Take 1-2 tablets (325-650 mg total) by mouth every 4 (four) hours as needed for mild pain.       amLODipine (NORVASC) 10 MG tablet Take 1 tablet (10 mg total) by mouth daily. 30 tablet 0   apixaban (ELIQUIS) 5 MG TABS tablet Take 1 tablet (5 mg total) by mouth 2 (two) times daily. 60 tablet 1   gabapentin (NEURONTIN) 600 MG tablet Take 1 tablet (600 mg total) by mouth 3 (three) times daily. 90 tablet 0   methocarbamol (ROBAXIN) 500 MG tablet Take 1 tablet (500 mg total) by mouth every 6 (six) hours as needed for muscle spasms. 60 tablet 0   mupirocin ointment (BACTROBAN) 2 % Apply 1 Application topically daily. 30 g 2   nicotine (NICODERM CQ - DOSED IN MG/24 HOURS) 21 mg/24hr  patch Place 1 patch (21 mg total) onto the skin daily. 28 patch 0   oxyCODONE (OXY IR/ROXICODONE) 5 MG immediate release tablet Take 1 tablet (5 mg total) by mouth every 6 (six) hours as needed for severe pain. 28 tablet 0   potassium chloride SA (KLOR-CON M) 20 MEQ tablet Take 1 tablet (20 mEq total) by mouth daily. 30 tablet 0   traZODone (DESYREL) 50 MG tablet Take 0.5-1 tablets (25-50 mg total) by mouth at bedtime as needed for sleep. 10 tablet 0   Ferrous Sulfate (IRON) 325 (65 Fe) MG TABS Take 1 tablet (325 mg total) by mouth daily. 60 tablet 0   hydrochlorothiazide (HYDRODIURIL) 25 MG tablet Take 1 tablet (25 mg total) by mouth daily. 30 tablet 0    No current facility-administered medications on file prior to visit.      There are no Patient Instructions on file for this visit. No follow-ups on file.     Kris Hartmann, NP PAIN:  Are you having pain? No  PRECAUTIONS: Fall  WEIGHT BEARING RESTRICTIONS: No  FALLS:  Has patient fallen  in last 6 months? 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  Pt. Has applied for disability (pending).    PLOF: Independent  PATIENT GOALS: Return to walking with less assistance  NEXT MD VISIT: PRN  OBJECTIVE:   PATIENT SURVEYS:  FOTO initial 46/ goal 36  COGNITION: Overall cognitive status: Within functional limits for tasks assessed     SENSATION: WFL  EDEMA:  Circumferential: 2" superior to template: R 47 cm/ L 51.5 cm.  Recommended shrinker use  MUSCLE LENGTH: Hamstrings: Right WNL; Left WNL  POSTURE: rounded shoulders, esp. When ambulating with RW  PALPATION: No tenderness/ good incision healing.    LOWER EXTREMITY ROM:  Good L LE AROM (all planes).  Prone L hip extension 25 deg.  B UE AROM WNL  LOWER EXTREMITY MMT:  B LE strength  grossly 5/5 MMT except L hip adduction/ extension 4/5 MMT.  B UE strength 5/5 MMT.   FUNCTIONAL TESTS:  5 times sit to stand: TBD 6 minute walk test: TBD  GAIT: Distance walked: In clinic/ //-bars Assistive device utilized: Walker - 2 wheeled Level of assistance: CGA Comments: pt. Ambulates in //-bars with cuing to increase a more consistent BOS/ recip. Step pattern with L heel strike to enforce L knee control/ wt. Bearing.  Pt. Ambulates at home without prosthetic leg and use of RW household distances.     TODAY'S TREATMENT:                                                                                                                              DATE: 07/20/22  Evaluation/ gait training in //-bars/ see HEP    PATIENT EDUCATION:  Education details: Access Code: [email or web address] Person educated: Patient Education method: Explanation, Demonstration, and Handouts Education comprehension: verbalized understanding and returned demonstration  HOME EXERCISE PROGRAM: Access Code: [email or web address] URL: [email or web address] Date: 07/20/2022 Prepared by: Dorene Grebe  Exercises - Side Stepping with Counter Support  - 1 x daily - 7 x weekly - 2 sets - 10 reps - Standing Hip Extension with Counter Support  - 1 x daily - 7 x weekly - 2 sets - 10 reps - Supine Single Leg Bridge with Sound Leg (AKA)  - 1 x daily - 7 x weekly - 2 sets - 10 reps - Prone Hip Extension with Residual Limb (AKA)  - 1 x daily - 7 x weekly - 2 sets - 10 reps - Sidelying Hip Abduction with Flexion and Extension (AKA)  - 1 x daily - 7 x weekly - 2 sets - 10 reps  ASSESSMENT:  CLINICAL IMPRESSION: Patient is a 42 y.o. female who was seen today for physical therapy evaluation and treatment for L AKA.  Pt. Presents with L hip/ gluteal muscle weakness and limitations in donning/  doffing prosthetic leg.  Pt. Requires CGA to ambulate short distances in //-bars with cuing.  Pt. Reports no pain and hoping to return to more  independent ambulation.  Pt. Will benefit from skilled PT services to increase LE strength to improve independence and safety with mod. I ambulation.    OBJECTIVE IMPAIRMENTS: Abnormal gait, decreased activity tolerance, decreased balance, decreased endurance, decreased mobility, difficulty walking, decreased ROM, decreased strength, improper body mechanics, postural dysfunction, and pain.   ACTIVITY LIMITATIONS: carrying, lifting, bending, squatting, stairs, transfers, toileting, dressing, and locomotion level  PARTICIPATION LIMITATIONS: cleaning, laundry, driving, shopping, and community activity  PERSONAL FACTORS: Age, Education, and Past/current experiences are also affecting patient's functional outcome.   REHAB POTENTIAL: Good  CLINICAL DECISION MAKING: Evolving/moderate complexity  EVALUATION COMPLEXITY: Moderate   GOALS: Goals reviewed with patient? Yes  SHORT TERM GOALS: Target date: 08/17/22 Pt. Able to don/doff L prosthetic leg independently with proper use of ply socks to improve fit/ use of prosthetic leg.   Baseline:  requires assist Goal status: INITIAL   LONG TERM GOALS: Target date: 09/14/22  Pt. Will increase FOTO to 57 to improve functional mobility.   Baseline:  initial 46.   Goal status: INITIAL  2.  Pt. Able to ambulate community distances with use of prosthetic leg/ mod. I with least assistive device to improve functional mobility.   Baseline:  CGA for short distances in //-bars.   Goal status: INITIAL  3.  Pt. Able to ascend/ descend 2 flights of stairs with mod. I and use of single handrail safely.   Baseline: TBD Goal status: INITIAL   PLAN:  PT FREQUENCY: 2x/week  PT DURATION: 8 weeks  PLANNED INTERVENTIONS: Therapeutic exercises, Therapeutic activity, Neuromuscular re-education, Balance training, Gait training, Patient/Family education, Self Care, Joint mobilization, Stair training, Prosthetic training, scar mobilization, Manual therapy, and  Re-evaluation  PLAN FOR NEXT SESSION: Gait and stair training/ focus on donning/ doffing prosthetic leg more independently   Cammie Mcgee, PT, DPT # [phone] 07/20/2022, 3:47 PM

## 2022-07-22 ENCOUNTER — Encounter: Payer: Medicaid Other | Admitting: Physical Therapy

## 2022-07-26 ENCOUNTER — Ambulatory Visit: Payer: Medicaid Other | Admitting: Physical Therapy

## 2022-07-26 DIAGNOSIS — R269 Unspecified abnormalities of gait and mobility: Secondary | ICD-10-CM

## 2022-07-26 DIAGNOSIS — Z89612 Acquired absence of left leg above knee: Secondary | ICD-10-CM

## 2022-07-26 DIAGNOSIS — M6281 Muscle weakness (generalized): Secondary | ICD-10-CM

## 2022-07-26 NOTE — Therapy (Signed)
OUTPATIENT PHYSICAL THERAPY LOWER EXTREMITY TREATMENT   Patient Name: Jocelyn [name] MRN: [id number] DOB:[birth year], 42 y.o., female Today's Date: 07/26/2022  END OF SESSION:  PT End of Session - 07/26/22 0939     Visit Number 2    Number of Visits 17    Date for PT Re-Evaluation 09/14/22    PT Start Time 0939    PT Stop Time 1028    PT Time Calculation (min) 49 min             Past Medical History:  Diagnosis Date   IDA (iron deficiency anemia) 12/17/2019   Past Surgical History:  Procedure Laterality Date   AMPUTATION Left 01/20/2022   Procedure: AMPUTATION ABOVE KNEE;  Surgeon: Algernon Huxley, MD;  Location: [facility];  Service: Vascular;  Laterality: Left;   APPENDECTOMY     LOWER EXTREMITY ANGIOGRAPHY Left 01/11/2020   Procedure: Lower Extremity Angiography;  Surgeon: Algernon Huxley, MD;  Location: [place] [facility];  Service: Cardiovascular;  Laterality: Left;   LOWER EXTREMITY ANGIOGRAPHY Left 03/06/2020   Procedure: Lower Extremity Angiography;  Surgeon: Algernon Huxley, MD;  Location: [place] [facility];  Service: Cardiovascular;  Laterality: Left;   LOWER EXTREMITY ANGIOGRAPHY Left 03/07/2020   Procedure: Lower Extremity Angiography;  Surgeon: Algernon Huxley, MD;  Location: [place] [facility];  Service: Cardiovascular;  Laterality: Left;   LOWER EXTREMITY ANGIOGRAPHY Left 01/06/2022   Procedure: Lower Extremity Angiography;  Surgeon: Algernon Huxley, MD;  Location: [place] [facility];  Service: Cardiovascular;  Laterality: Left;   LOWER EXTREMITY ANGIOGRAPHY Left 01/07/2022   Procedure: Lower Extremity Angiography;  Surgeon: Algernon Huxley, MD;  Location: [place] [facility];  Service: Cardiovascular;  Laterality: Left;   LOWER EXTREMITY ANGIOGRAPHY Left 01/18/2022   Procedure: Lower Extremity Angiography;  Surgeon: Algernon Huxley, MD;  Location: [place] [facility];  Service: Cardiovascular;  Laterality: Left;   LOWER EXTREMITY INTERVENTION Bilateral 01/17/2022   Procedure:  LOWER EXTREMITY INTERVENTION;  Surgeon: Zara Chess, MD;  Location: [place] [facility];  Service: Cardiovascular;  Laterality: Bilateral;   PERIPHERAL VASCULAR THROMBECTOMY Left 01/20/2022   Procedure: PERIPHERAL VASCULAR THROMBECTOMY Left Femoral vein;  Surgeon: Algernon Huxley, MD;  Location: [facility];  Service: Vascular;  Laterality: Left;   Patient Active Problem List   Diagnosis Date Noted   S/P AKA (above knee amputation) unilateral, left ([place]) 01/25/2022   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[place]) 01/19/2022   Acute urinary retention 01/19/2022   Fever 01/18/2022   Acute deep vein thrombosis (DVT) of left femoral vein ([place]) 01/17/2022   Hypomagnesemia 01/06/2022   Hyponatremia 01/06/2022   Critical limb ischemia of left lower extremity ([place]) 01/10/2020   Diabetes mellitus without complication ([place]) 51/08/5850   IDA (iron deficiency anemia) 12/17/2019   HTN (hypertension) 12/17/2019    [facility]P: [facility] [place]  REFERRING PROVIDER: Kris Hartmann, NP  REFERRING DIAG: S/P AKA (above knee amputation) unilateral, left ([place])  THERAPY DIAG:  S/P AKA (above knee amputation) unilateral, left ([facility])  Muscle weakness (generalized)  Gait difficulty  Rationale for Evaluation and Treatment: Rehabilitation  ONSET DATE: 01/2023  SUBJECTIVE:   SUBJECTIVE STATEMENT: Pt. Reports having increase blood clots after receiving Covid vaccine 3 years ago. Pt. Had multiple surgeries resulting in L AKA on 01/20/22.  Pt. Received prosthetic leg 1 month ago from [facility].  Pt. Was delayed with receiving leg secondary to insurance issues.  Pt. Has not walked with prosthetic leg at  this time.  No c/o pain but phantom limb symptoms.  Pt. Has not been compliant with donning shrinker or HEP.    PERTINENT HISTORY: 1. S/P AKA (above knee amputation) unilateral, left (H[facility]) The patient will continue with physical therapy.  Patient will follow-up if there are issues with her residual limb. 2. Primary  hypertension Continue antihypertensive medications as already ordered, these medications have been reviewed and there are no changes at this time.    3. Peripheral arterial disease with history of revascularization ([facility])  Recommend:   The patient has evidence of atherosclerosis of the lower extremities with claudication.  The patient does not voice lifestyle limiting changes at this point in time.   Noninvasive studies do not suggest clinically significant change.   No invasive studies, angiography or surgery at this time The patient should continue walking and begin a more formal exercise program.  The patient should continue antiplatelet therapy and aggressive treatment of the lipid abnormalities   No changes in the patient's medications at this time   Continued surveillance is indicated as atherosclerosis is likely to progress with time.     The patient will continue follow up with noninvasive studies as ordered.  The patient follow-up in 6 months or sooner if issues arise.           Current Outpatient Medications on File Prior to Visit  Medication Sig Dispense Refill   acetaminophen (TYLENOL) 325 MG tablet Take 1-2 tablets (325-650 mg total) by mouth every 4 (four) hours as needed for mild pain.       amLODipine (NORVASC) 10 MG tablet Take 1 tablet (10 mg total) by mouth daily. 30 tablet 0   apixaban (ELIQUIS) 5 MG TABS tablet Take 1 tablet (5 mg total) by mouth 2 (two) times daily. 60 tablet 1   gabapentin (NEURONTIN) 600 MG tablet Take 1 tablet (600 mg total) by mouth 3 (three) times daily. 90 tablet 0   methocarbamol (ROBAXIN) 500 MG tablet Take 1 tablet (500 mg total) by mouth every 6 (six) hours as needed for muscle spasms. 60 tablet 0   mupirocin ointment (BACTROBAN) 2 % Apply 1 Application topically daily. 30 g 2   nicotine (NICODERM CQ - DOSED IN MG/24 HOURS) 21 mg/24hr patch Place 1 patch (21 mg total) onto the skin daily. 28 patch 0   oxyCODONE (OXY IR/ROXICODONE) 5 MG  immediate release tablet Take 1 tablet (5 mg total) by mouth every 6 (six) hours as needed for severe pain. 28 tablet 0   potassium chloride SA (KLOR-CON M) 20 MEQ tablet Take 1 tablet (20 mEq total) by mouth daily. 30 tablet 0   traZODone (DESYREL) 50 MG tablet Take 0.5-1 tablets (25-50 mg total) by mouth at bedtime as needed for sleep. 10 tablet 0   Ferrous Sulfate (IRON) 325 (65 Fe) MG TABS Take 1 tablet (325 mg total) by mouth daily. 60 tablet 0   hydrochlorothiazide (HYDRODIURIL) 25 MG tablet Take 1 tablet (25 mg total) by mouth daily. 30 tablet 0    No current facility-administered medications on file prior to visit.      There are no Patient Instructions on file for this visit. No follow-ups on file.     Georgiana Spinner, NP PAIN:  Are you having pain? No  PRECAUTIONS: Fall  WEIGHT BEARING RESTRICTIONS: No  FALLS:  Has patient fallen in last 6 months? 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  Pt. Has applied for disability (pending).    PLOF: Independent  PATIENT GOALS: Return to walking with less assistance  NEXT MD VISIT: PRN  OBJECTIVE:   PATIENT SURVEYS:  FOTO initial 46/ goal 16  COGNITION: Overall cognitive status: Within functional limits for tasks assessed     SENSATION: WFL  EDEMA:  Circumferential: 2" superior to template: R 47 cm/ L 51.5 cm.  Recommended shrinker use  MUSCLE LENGTH: Hamstrings: Right WNL; Left WNL  POSTURE: rounded shoulders, esp. When ambulating with RW  PALPATION: No tenderness/ good incision healing.    LOWER EXTREMITY ROM:  Good L LE AROM (all planes).  Prone L hip extension 25 deg.  B UE AROM WNL  LOWER EXTREMITY MMT:  B LE strength grossly 5/5 MMT except L hip adduction/ extension 4/5 MMT.  B UE strength 5/5 MMT.   FUNCTIONAL  TESTS:  5 times sit to stand: TBD 6 minute walk test: TBD  GAIT: Distance walked: In clinic/ //-bars Assistive device utilized: Walker - 2 wheeled Level of assistance: CGA Comments: pt. Ambulates in //-bars with cuing to increase a more consistent BOS/ recip. Step pattern with L heel strike to enforce L knee control/ wt. Bearing.  Pt. Ambulates at home without prosthetic leg and use of RW household distances.     TODAY'S TREATMENT:                                                                                                                              DATE: 07/26/22  Subjective:  No c/o L residual limb pain.  Pt. Entered PT with leg donned and using RW to walk into PT gym.  Pt. Brought in ply sock bag.  Pt. Using 1 ply sock with gel liner today.  Pt. Reports she is wearing shrinker on residual limb around 2 hours/day.  Pt states she is wearing prosthetic leg 8 hours/ day.  PT discussed use of prosthetic leg on a daily basis and avoid prolonged use at this time.     Neuro. Mm.:  Walking in //-bars with B UE assist (forward/ backwards/ lateral)- 3 laps each.  Mirror feedback and CGA for safety.     5xSTS: 24.08 sec./ 17.55 sec. (No UE assist but SBA from SPT for safety)- 1 slight LOB with self-correction in //-bars.    Gait training:   Ambulate in //-bars progressing from B UE assist to R UE only with consistent recip. Gait pattern.   Cuing to increase step  Ascend/ descend stairs with step to gait with proper technique and B UE assist.  4 steps with B handrails x 4.    Ambulate in gym/ hallway with use of RW and CGA for cuing and safety.  1 episode of L knee bucking during stance phase of gait and pt. Able to correct with heavy  UE assist on RW and PT CGA.      PATIENT EDUCATION:  Education details: Access Code: [email or web address] Person educated: Patient Education method: Explanation, Demonstration, and Handouts Education comprehension: verbalized understanding and returned  demonstration  HOME EXERCISE PROGRAM: Access Code: [email or web address] URL: [email or web address] Date: 07/20/2022 Prepared by: Dorcas Carrow  Exercises - Side Stepping with Counter Support  - 1 x daily - 7 x weekly - 2 sets - 10 reps - Standing Hip Extension with Counter Support  - 1 x daily - 7 x weekly - 2 sets - 10 reps - Supine Single Leg Bridge with Sound Leg (AKA)  - 1 x daily - 7 x weekly - 2 sets - 10 reps - Prone Hip Extension with Residual Limb (AKA)  - 1 x daily - 7 x weekly - 2 sets - 10 reps - Sidelying Hip Abduction with Flexion and Extension (AKA)  - 1 x daily - 7 x weekly - 2 sets - 10 reps  ASSESSMENT:  CLINICAL IMPRESSION: Pt. Entered PT with marked improvement in gait pattern while using prosthetic leg.  Pt. Requires CGA to ambulate short distances in //-bars with cuing and able to progress to R UE use only in //-bar.  Pt. Reports no pain and hoping to return to more independent ambulation.  Pt. Demonstrates good understanding/ technique with step gait pattern at stairs with B UE assist required for safety.   Pt. Will benefit from skilled PT services to increase LE strength to improve independence and safety with mod. I ambulation.    OBJECTIVE IMPAIRMENTS: Abnormal gait, decreased activity tolerance, decreased balance, decreased endurance, decreased mobility, difficulty walking, decreased ROM, decreased strength, improper body mechanics, postural dysfunction, and pain.   ACTIVITY LIMITATIONS: carrying, lifting, bending, squatting, stairs, transfers, toileting, dressing, and locomotion level  PARTICIPATION LIMITATIONS: cleaning, laundry, driving, shopping, and community activity  PERSONAL FACTORS: Age, Education, and Past/current experiences are also affecting patient's functional outcome.   REHAB POTENTIAL: Good  CLINICAL DECISION MAKING: Evolving/moderate complexity  EVALUATION COMPLEXITY: Moderate   GOALS: Goals reviewed with patient? Yes  SHORT TERM  GOALS: Target date: 08/17/22 Pt. Able to don/doff L prosthetic leg independently with proper use of ply socks to improve fit/ use of prosthetic leg.   Baseline:  requires assist Goal status: INITIAL   LONG TERM GOALS: Target date: 09/14/22  Pt. Will increase FOTO to 57 to improve functional mobility.   Baseline:  initial 46.   Goal status: INITIAL  2.  Pt. Able to ambulate community distances with use of prosthetic leg/ mod. I with least assistive device to improve functional mobility.   Baseline:  CGA for short distances in //-bars.   Goal status: INITIAL  3.  Pt. Able to ascend/ descend 2 flights of stairs with mod. I and use of single handrail safely.   Baseline: TBD Goal status: INITIAL   PLAN:  PT FREQUENCY: 2x/week  PT DURATION: 8 weeks  PLANNED INTERVENTIONS: Therapeutic exercises, Therapeutic activity, Neuromuscular re-education, Balance training, Gait training, Patient/Family education, Self Care, Joint mobilization, Stair training, Prosthetic training, scar mobilization, Manual therapy, and Re-evaluation  PLAN FOR NEXT SESSION: Gait and stair training/ focus on donning/ doffing prosthetic leg more independently   Pura Spice, PT, DPT # [phone] 07/26/2022, 1:01 PM

## 2022-07-29 ENCOUNTER — Ambulatory Visit: Payer: Medicaid Other | Admitting: Physical Therapy

## 2022-07-29 DIAGNOSIS — Z89612 Acquired absence of left leg above knee: Secondary | ICD-10-CM

## 2022-07-29 DIAGNOSIS — M6281 Muscle weakness (generalized): Secondary | ICD-10-CM

## 2022-07-29 DIAGNOSIS — R269 Unspecified abnormalities of gait and mobility: Secondary | ICD-10-CM

## 2022-07-29 NOTE — Therapy (Signed)
OUTPATIENT PHYSICAL THERAPY LOWER EXTREMITY TREATMENT   Patient Name: Jocelyn [name] MRN: [id number] DOB:March 12, 1981, 42 y.o., female Today's Date: 07/29/2022  END OF SESSION:  PT End of Session - 07/29/22 0732     Visit Number 3    Number of Visits 17    Date for PT Re-Evaluation 09/14/22    PT Start Time 0733    PT Stop Time 0819    PT Time Calculation (min) 46 min    Equipment Utilized During Treatment Gait belt    Activity Tolerance Patient tolerated treatment well    Behavior During Therapy [facility] for tasks assessed/performed             Past Medical History:  Diagnosis Date   IDA (iron deficiency anemia) 12/17/2019   Past Surgical History:  Procedure Laterality Date   AMPUTATION Left 01/20/2022   Procedure: AMPUTATION ABOVE KNEE;  Surgeon: Algernon Huxley, MD;  Location: [facility];  Service: Vascular;  Laterality: Left;   APPENDECTOMY     LOWER EXTREMITY ANGIOGRAPHY Left 01/11/2020   Procedure: Lower Extremity Angiography;  Surgeon: Algernon Huxley, MD;  Location: [place] [facility];  Service: Cardiovascular;  Laterality: Left;   LOWER EXTREMITY ANGIOGRAPHY Left 03/06/2020   Procedure: Lower Extremity Angiography;  Surgeon: Algernon Huxley, MD;  Location: [place] [facility];  Service: Cardiovascular;  Laterality: Left;   LOWER EXTREMITY ANGIOGRAPHY Left 03/07/2020   Procedure: Lower Extremity Angiography;  Surgeon: Algernon Huxley, MD;  Location: [place] [facility];  Service: Cardiovascular;  Laterality: Left;   LOWER EXTREMITY ANGIOGRAPHY Left 01/06/2022   Procedure: Lower Extremity Angiography;  Surgeon: Algernon Huxley, MD;  Location: [place] [facility];  Service: Cardiovascular;  Laterality: Left;   LOWER EXTREMITY ANGIOGRAPHY Left 01/07/2022   Procedure: Lower Extremity Angiography;  Surgeon: Algernon Huxley, MD;  Location: [place] [facility];  Service: Cardiovascular;  Laterality: Left;   LOWER EXTREMITY ANGIOGRAPHY Left 01/18/2022   Procedure: Lower Extremity Angiography;   Surgeon: Algernon Huxley, MD;  Location: [place] [facility];  Service: Cardiovascular;  Laterality: Left;   LOWER EXTREMITY INTERVENTION Bilateral 01/17/2022   Procedure: LOWER EXTREMITY INTERVENTION;  Surgeon: Zara Chess, MD;  Location: [place] [facility];  Service: Cardiovascular;  Laterality: Bilateral;   PERIPHERAL VASCULAR THROMBECTOMY Left 01/20/2022   Procedure: PERIPHERAL VASCULAR THROMBECTOMY Left Femoral vein;  Surgeon: Algernon Huxley, MD;  Location: [facility];  Service: Vascular;  Laterality: Left;   Patient Active Problem List   Diagnosis Date Noted   S/P AKA (above knee amputation) unilateral, left ([place]) 01/25/2022   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[place]) 01/19/2022   Acute urinary retention 01/19/2022   Fever 01/18/2022   Acute deep vein thrombosis (DVT) of left femoral vein ([place]) 01/17/2022   Hypomagnesemia 01/06/2022   Hyponatremia 01/06/2022   Critical limb ischemia of left lower extremity ([place]) 01/10/2020   Diabetes mellitus without complication ([place]) 16/60/6301   IDA (iron deficiency anemia) 12/17/2019   HTN (hypertension) 12/17/2019    PCP: [facility] [place]  REFERRING PROVIDER: Kris Hartmann, NP  REFERRING DIAG: S/P AKA (above knee amputation) unilateral, left ([place])  THERAPY DIAG:  S/P AKA (above knee amputation) unilateral, left (HCC)  Muscle weakness (generalized)  Gait difficulty  Rationale for Evaluation and Treatment: Rehabilitation  ONSET DATE: 01/2023  SUBJECTIVE:   SUBJECTIVE STATEMENT: Pt. Reports having increase blood clots after receiving Covid vaccine 3 years ago. Pt. Had multiple surgeries resulting in L AKA on 01/20/22.  Pt. Received  prosthetic leg 1 month ago from [employer].  Pt. Was delayed with receiving leg secondary to insurance issues.  Pt. Has not walked with prosthetic leg at this time.  No c/o pain but phantom limb symptoms.  Pt. Has not been compliant with donning shrinker or HEP.    PERTINENT HISTORY: 1. S/P AKA (above knee  amputation) unilateral, left ([place]) The patient will continue with physical therapy.  Patient will follow-up if there are issues with her residual limb. 2. Primary hypertension Continue antihypertensive medications as already ordered, these medications have been reviewed and there are no changes at this time.    3. Peripheral arterial disease with history of revascularization (HCC)  Recommend:   The patient has evidence of atherosclerosis of the lower extremities with claudication.  The patient does not voice lifestyle limiting changes at this point in time.   Noninvasive studies do not suggest clinically significant change.   No invasive studies, angiography or surgery at this time The patient should continue walking and begin a more formal exercise program.  The patient should continue antiplatelet therapy and aggressive treatment of the lipid abnormalities   No changes in the patient's medications at this time   Continued surveillance is indicated as atherosclerosis is likely to progress with time.     The patient will continue follow up with noninvasive studies as ordered.  The patient follow-up in 6 months or sooner if issues arise.           Current Outpatient Medications on File Prior to Visit  Medication Sig Dispense Refill   acetaminophen (TYLENOL) 325 MG tablet Take 1-2 tablets (325-650 mg total) by mouth every 4 (four) hours as needed for mild pain.       amLODipine (NORVASC) 10 MG tablet Take 1 tablet (10 mg total) by mouth daily. 30 tablet 0   apixaban (ELIQUIS) 5 MG TABS tablet Take 1 tablet (5 mg total) by mouth 2 (two) times daily. 60 tablet 1   gabapentin (NEURONTIN) 600 MG tablet Take 1 tablet (600 mg total) by mouth 3 (three) times daily. 90 tablet 0   methocarbamol (ROBAXIN) 500 MG tablet Take 1 tablet (500 mg total) by mouth every 6 (six) hours as needed for muscle spasms. 60 tablet 0   mupirocin ointment (BACTROBAN) 2 % Apply 1 Application topically daily. 30 g 2    nicotine (NICODERM CQ - DOSED IN MG/24 HOURS) 21 mg/24hr patch Place 1 patch (21 mg total) onto the skin daily. 28 patch 0   oxyCODONE (OXY IR/ROXICODONE) 5 MG immediate release tablet Take 1 tablet (5 mg total) by mouth every 6 (six) hours as needed for severe pain. 28 tablet 0   potassium chloride SA (KLOR-CON M) 20 MEQ tablet Take 1 tablet (20 mEq total) by mouth daily. 30 tablet 0   traZODone (DESYREL) 50 MG tablet Take 0.5-1 tablets (25-50 mg total) by mouth at bedtime as needed for sleep. 10 tablet 0   Ferrous Sulfate (IRON) 325 (65 Fe) MG TABS Take 1 tablet (325 mg total) by mouth daily. 60 tablet 0   hydrochlorothiazide (HYDRODIURIL) 25 MG tablet Take 1 tablet (25 mg total) by mouth daily. 30 tablet 0    No current facility-administered medications on file prior to visit.      There are no Patient Instructions on file for this visit. No follow-ups on file.     Kris Hartmann, NP PAIN:  Are you having pain? No  PRECAUTIONS: Fall  WEIGHT BEARING RESTRICTIONS:  No  FALLS:  Has patient fallen in last 6 months? 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  Pt. Has applied for disability (pending).    PLOF: Independent  PATIENT GOALS: Return to walking with less assistance  NEXT MD VISIT: PRN  OBJECTIVE:   PATIENT SURVEYS:  FOTO initial 46/ goal 66  COGNITION: Overall cognitive status: Within functional limits for tasks assessed     SENSATION: WFL  EDEMA:  Circumferential: 2" superior to template: R 47 cm/ L 51.5 cm.  Recommended shrinker use  MUSCLE LENGTH: Hamstrings: Right WNL; Left WNL  POSTURE: rounded shoulders, esp. When ambulating with RW  PALPATION: No tenderness/ good incision healing.    LOWER EXTREMITY ROM:  Good L LE AROM (all planes).  Prone L hip extension 25  deg.  B UE AROM WNL  LOWER EXTREMITY MMT:  B LE strength grossly 5/5 MMT except L hip adduction/ extension 4/5 MMT.  B UE strength 5/5 MMT.   FUNCTIONAL TESTS:  5 times sit to stand: TBD 6 minute walk test: TBD  GAIT: Distance walked: In clinic/ //-bars Assistive device utilized: Walker - 2 wheeled Level of assistance: CGA Comments: pt. Ambulates in //-bars with cuing to increase a more consistent BOS/ recip. Step pattern with L heel strike to enforce L knee control/ wt. Bearing.  Pt. Ambulates at home without prosthetic leg and use of RW household distances.    1/16:  5xSTS: 24.08 sec./ 17.55 sec. (No UE assist but SBA from SPT for safety)- 1 slight LOB with self-correction in //-bars.     TODAY'S TREATMENT:                                                                                                                              DATE: 07/29/22  Subjective:  Pt. Arrived to PT with her leg donned and using RW to ambulate. No reports of discomfort or pain.  Pt. Reports having phantom limb pain in the mornings and evenings but it resolves after 45 min. - 1 hour. Pt. Using 1 ply sock with gel liner today.  Pt. Reports she is wearing shrinker on residual limb around 4+ hours/day.  Pt states she is wearing prosthetic leg 8 hours/ day. Pt. Cued during exercise to keep a wide BOS and assure locking out of the prosthetic during gait training. Pt. Progressing to less UE assist (1 hand) during gait training activities. Pt. Will continue to benefit from skilled PT services    Nustep L2 10 min B UE/LE (focus on L hip control/ midline position of L LE)- no issues reported.    Gait training:   Ambulate in //-bars progressing from B UE assist to R UE only with consistent recip. Gait pattern.   Cuing to increase step length on  R LE.  Lateral walking with light UE assist in //-bars.    Step ups in //-bars with 6" step on R LE with focus on decrease UE assist.   No LOB but CGA for safety/ cuing.       Ascend/ descend stairs with step to gait with proper technique and B UE assist.  4 steps with B handrails x 4.    Ambulate in gym/ hallway with use of RW and CGA for cuing and safety with no buckling of L knee mechanism. Pt. Cued to increase cadence. PT CGA.     PATIENT EDUCATION:  Education details: Access Code: [email or web address] Person educated: Patient Education method: Explanation, Demonstration, and Handouts Education comprehension: verbalized understanding and returned demonstration  HOME EXERCISE PROGRAM: Access Code: [email or web address] URL: [email or web address] Date: 07/20/2022 Prepared by: Dorcas Carrow  Exercises - Side Stepping with Counter Support  - 1 x daily - 7 x weekly - 2 sets - 10 reps - Standing Hip Extension with Counter Support  - 1 x daily - 7 x weekly - 2 sets - 10 reps - Supine Single Leg Bridge with Sound Leg (AKA)  - 1 x daily - 7 x weekly - 2 sets - 10 reps - Prone Hip Extension with Residual Limb (AKA)  - 1 x daily - 7 x weekly - 2 sets - 10 reps - Sidelying Hip Abduction with Flexion and Extension (AKA)  - 1 x daily - 7 x weekly - 2 sets - 10 reps  ASSESSMENT:  CLINICAL IMPRESSION: Pt. Entered PT with marked improvement in gait pattern while using prosthetic leg. Pt. Is very motivated to be independent in daily activities. Pt. Requires CGA to ambulate short distances in //-bars with cuing and able to progress to R UE use only in //-bar.  Pt. Reports no pain and hoping to return to more independent ambulation.  Pt. Demonstrates good understanding/ technique with step gait pattern at stairs with B UE assist required for safety. Pt. Requires some cuing to maintain wid BOS during gait training activities. Pt. Will benefit from skilled PT services to increase LE strength to improve independence and safety with mod. I ambulation.    OBJECTIVE IMPAIRMENTS: Abnormal gait, decreased activity tolerance, decreased balance, decreased endurance, decreased mobility,  difficulty walking, decreased ROM, decreased strength, improper body mechanics, postural dysfunction, and pain.   ACTIVITY LIMITATIONS: carrying, lifting, bending, squatting, stairs, transfers, toileting, dressing, and locomotion level  PARTICIPATION LIMITATIONS: cleaning, laundry, driving, shopping, and community activity  PERSONAL FACTORS: Age, Education, and Past/current experiences are also affecting patient's functional outcome.   REHAB POTENTIAL: Good  CLINICAL DECISION MAKING: Evolving/moderate complexity  EVALUATION COMPLEXITY: Moderate   GOALS: Goals reviewed with patient? Yes  SHORT TERM GOALS: Target date: 08/17/22 Pt. Able to don/doff L prosthetic leg independently with proper use of ply socks to improve fit/ use of prosthetic leg.   Baseline:  requires assist Goal status: INITIAL   LONG TERM GOALS: Target date: 09/14/22  Pt. Will increase FOTO to 57 to improve functional mobility.   Baseline:  initial 46.   Goal status: INITIAL  2.  Pt. Able to ambulate community distances with use of prosthetic leg/ mod. I with least assistive device to improve functional mobility.   Baseline:  CGA for short distances in //-bars.   Goal status: INITIAL  3.  Pt. Able to ascend/ descend 2 flights of stairs with mod. I and use of single handrail safely.   Baseline: TBD Goal status: INITIAL   PLAN:  PT FREQUENCY: 2x/week  PT DURATION: 8 weeks  PLANNED INTERVENTIONS: Therapeutic exercises, Therapeutic activity, Neuromuscular re-education, Balance training, Gait training, Patient/Family education, Self Care, Joint mobilization, Stair training, Prosthetic training, scar mobilization, Manual therapy, and Re-evaluation  PLAN FOR NEXT SESSION: Advance gait and stair training  Ieshia Hatcher B. Artis Flock, SPT Cammie Mcgee, PT, DPT # [phone] 07/29/2022, 12:27 PM

## 2022-08-02 ENCOUNTER — Ambulatory Visit: Payer: Medicaid Other | Admitting: Physical Therapy

## 2022-08-02 DIAGNOSIS — M6281 Muscle weakness (generalized): Secondary | ICD-10-CM

## 2022-08-02 DIAGNOSIS — Z89612 Acquired absence of left leg above knee: Secondary | ICD-10-CM | POA: Diagnosis not present

## 2022-08-02 DIAGNOSIS — R269 Unspecified abnormalities of gait and mobility: Secondary | ICD-10-CM

## 2022-08-02 NOTE — Therapy (Signed)
OUTPATIENT PHYSICAL THERAPY LOWER EXTREMITY TREATMENT   Patient Name: Jocelyn [name] MRN: [id number] DOB:1980-08-18, 42 y.o., female Today's Date: 08/02/2022  END OF SESSION:  PT End of Session - 08/02/22 1001     Visit Number 4    Number of Visits 17    Date for PT Re-Evaluation 09/14/22    PT Start Time 0934    PT Stop Time 1026    PT Time Calculation (min) 52 min    Equipment Utilized During Treatment Gait belt    Activity Tolerance Patient tolerated treatment well    Behavior During Therapy WFL for tasks assessed/performed             Past Medical History:  Diagnosis Date   IDA (iron deficiency anemia) 12/17/2019   Past Surgical History:  Procedure Laterality Date   AMPUTATION Left 01/20/2022   Procedure: AMPUTATION ABOVE KNEE;  Surgeon: Annice Needy, MD;  Location: [facility];  Service: Vascular;  Laterality: Left;   APPENDECTOMY     LOWER EXTREMITY ANGIOGRAPHY Left 01/11/2020   Procedure: Lower Extremity Angiography;  Surgeon: Annice Needy, MD;  Location: [facility];  Service: Cardiovascular;  Laterality: Left;   LOWER EXTREMITY ANGIOGRAPHY Left 03/06/2020   Procedure: Lower Extremity Angiography;  Surgeon: Annice Needy, MD;  Location: [facility];  Service: Cardiovascular;  Laterality: Left;   LOWER EXTREMITY ANGIOGRAPHY Left 03/07/2020   Procedure: Lower Extremity Angiography;  Surgeon: Annice Needy, MD;  Location: [facility];  Service: Cardiovascular;  Laterality: Left;   LOWER EXTREMITY ANGIOGRAPHY Left 01/06/2022   Procedure: Lower Extremity Angiography;  Surgeon: Annice Needy, MD;  Location: [facility];  Service: Cardiovascular;  Laterality: Left;   LOWER EXTREMITY ANGIOGRAPHY Left 01/07/2022   Procedure: Lower Extremity Angiography;  Surgeon: Annice Needy, MD;  Location: [facility];  Service: Cardiovascular;  Laterality: Left;   LOWER EXTREMITY ANGIOGRAPHY Left 01/18/2022   Procedure: Lower Extremity Angiography;   Surgeon: Annice Needy, MD;  Location: [facility];  Service: Cardiovascular;  Laterality: Left;   LOWER EXTREMITY INTERVENTION Bilateral 01/17/2022   Procedure: LOWER EXTREMITY INTERVENTION;  Surgeon: Learta Codding, MD;  Location: [facility];  Service: Cardiovascular;  Laterality: Bilateral;   PERIPHERAL VASCULAR THROMBECTOMY Left 01/20/2022   Procedure: PERIPHERAL VASCULAR THROMBECTOMY Left Femoral vein;  Surgeon: Annice Needy, MD;  Location: [facility];  Service: Vascular;  Laterality: Left;   Patient Active Problem List   Diagnosis Date Noted   S/P AKA (above knee amputation) unilateral, left (HCC) 01/25/2022   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   Acute urinary retention 01/19/2022   Fever 01/18/2022   Acute deep vein thrombosis (DVT) of left femoral vein (HCC) 01/17/2022   Hypomagnesemia 01/06/2022   Hyponatremia 01/06/2022   Critical limb ischemia of left lower extremity (HCC) 01/10/2020   Diabetes mellitus without complication (HCC) 12/17/2019   IDA (iron deficiency anemia) 12/17/2019   HTN (hypertension) 12/17/2019    PCP: [facility]  REFERRING PROVIDER: Georgiana Spinner, NP  REFERRING DIAG: S/P AKA (above knee amputation) unilateral, left (HCC)  THERAPY DIAG:  S/P AKA (above knee amputation) unilateral, left (HCC)  Muscle weakness (generalized)  Gait difficulty  Rationale for Evaluation and Treatment: Rehabilitation  ONSET DATE: 01/2023  SUBJECTIVE:   SUBJECTIVE STATEMENT: Pt. Reports having increase blood clots after receiving Covid vaccine 3 years ago. Pt. Had multiple surgeries resulting in L AKA on 01/20/22.  Pt. Received  prosthetic leg 1 month ago from [employer].  Pt. Was delayed with receiving leg secondary to insurance issues.  Pt. Has not walked with prosthetic leg at this time.  No c/o pain but phantom limb symptoms.  Pt. Has not been compliant with donning shrinker or HEP.    PERTINENT HISTORY: 1. S/P AKA (above knee  amputation) unilateral, left ([place]) The patient will continue with physical therapy.  Patient will follow-up if there are issues with her residual limb. 2. Primary hypertension Continue antihypertensive medications as already ordered, these medications have been reviewed and there are no changes at this time.    3. Peripheral arterial disease with history of revascularization (HCC)  Recommend:   The patient has evidence of atherosclerosis of the lower extremities with claudication.  The patient does not voice lifestyle limiting changes at this point in time.   Noninvasive studies do not suggest clinically significant change.   No invasive studies, angiography or surgery at this time The patient should continue walking and begin a more formal exercise program.  The patient should continue antiplatelet therapy and aggressive treatment of the lipid abnormalities   No changes in the patient's medications at this time   Continued surveillance is indicated as atherosclerosis is likely to progress with time.     The patient will continue follow up with noninvasive studies as ordered.  The patient follow-up in 6 months or sooner if issues arise.           Current Outpatient Medications on File Prior to Visit  Medication Sig Dispense Refill   acetaminophen (TYLENOL) 325 MG tablet Take 1-2 tablets (325-650 mg total) by mouth every 4 (four) hours as needed for mild pain.       amLODipine (NORVASC) 10 MG tablet Take 1 tablet (10 mg total) by mouth daily. 30 tablet 0   apixaban (ELIQUIS) 5 MG TABS tablet Take 1 tablet (5 mg total) by mouth 2 (two) times daily. 60 tablet 1   gabapentin (NEURONTIN) 600 MG tablet Take 1 tablet (600 mg total) by mouth 3 (three) times daily. 90 tablet 0   methocarbamol (ROBAXIN) 500 MG tablet Take 1 tablet (500 mg total) by mouth every 6 (six) hours as needed for muscle spasms. 60 tablet 0   mupirocin ointment (BACTROBAN) 2 % Apply 1 Application topically daily. 30 g 2    nicotine (NICODERM CQ - DOSED IN MG/24 HOURS) 21 mg/24hr patch Place 1 patch (21 mg total) onto the skin daily. 28 patch 0   oxyCODONE (OXY IR/ROXICODONE) 5 MG immediate release tablet Take 1 tablet (5 mg total) by mouth every 6 (six) hours as needed for severe pain. 28 tablet 0   potassium chloride SA (KLOR-CON M) 20 MEQ tablet Take 1 tablet (20 mEq total) by mouth daily. 30 tablet 0   traZODone (DESYREL) 50 MG tablet Take 0.5-1 tablets (25-50 mg total) by mouth at bedtime as needed for sleep. 10 tablet 0   Ferrous Sulfate (IRON) 325 (65 Fe) MG TABS Take 1 tablet (325 mg total) by mouth daily. 60 tablet 0   hydrochlorothiazide (HYDRODIURIL) 25 MG tablet Take 1 tablet (25 mg total) by mouth daily. 30 tablet 0    No current facility-administered medications on file prior to visit.      There are no Patient Instructions on file for this visit. No follow-ups on file.     Kris Hartmann, NP PAIN:  Are you having pain? No  PRECAUTIONS: Fall  WEIGHT BEARING RESTRICTIONS:  No  FALLS:  Has patient fallen in last 6 months? 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  Pt. Has applied for disability (pending).    PLOF: Independent  PATIENT GOALS: Return to walking with less assistance  NEXT MD VISIT: PRN  OBJECTIVE:   PATIENT SURVEYS:  FOTO initial 46/ goal 37  COGNITION: Overall cognitive status: Within functional limits for tasks assessed     SENSATION: WFL  EDEMA:  Circumferential: 2" superior to template: R 47 cm/ L 51.5 cm.  Recommended shrinker use  MUSCLE LENGTH: Hamstrings: Right WNL; Left WNL  POSTURE: rounded shoulders, esp. When ambulating with RW  PALPATION: No tenderness/ good incision healing.    LOWER EXTREMITY ROM:  Good L LE AROM (all planes).  Prone L hip extension 25  deg.  B UE AROM WNL  LOWER EXTREMITY MMT:  B LE strength grossly 5/5 MMT except L hip adduction/ extension 4/5 MMT.  B UE strength 5/5 MMT.   FUNCTIONAL TESTS:  5 times sit to stand: TBD 6 minute walk test: TBD  GAIT: Distance walked: In clinic/ //-bars Assistive device utilized: Walker - 2 wheeled Level of assistance: CGA Comments: pt. Ambulates in //-bars with cuing to increase a more consistent BOS/ recip. Step pattern with L heel strike to enforce L knee control/ wt. Bearing.  Pt. Ambulates at home without prosthetic leg and use of RW household distances.     TODAY'S TREATMENT:                                                                                                                              DATE: 08/02/22  Subjective:  No c/o L residual limb or incision site pain. Pt. Entered PT with leg donned and using RW to walk into PT gym. Pt. Used SPC throughout the treatment session. Pt. Using 1 ply sock with gel liner today.  Pt. Reports she is wearing shrinker on residual limb around 2 hours/day. Pt. States there is no feeling of air between the residual limb and prosthetic and she's experiencing no discomfort when wearing it. Pt states she is wearing prosthetic leg 6 hours/ day.  Pt. Reports wearing prosthetic to perform daily tasks such as cooking, dishes, and bathroom with no pain or discomfort. PT discussed slowly increasing use of prosthetic leg on a daily basis.     There Ex Warm up:  No charge NuStep L4 10 min. B UE/LE. No reports of pain.   Gait training: Walking in //-bars with B UE assist forward- 5 laps.  Mirror feedback and CGA for safety.      Ambulate in //-bars progressing from R UE assist to [facility] only with consistent recip. Gait pattern. Cuing to increase step. 5 laps.   Ambulate at agility ladder with [facility]  x 5 laps. Cue stride length with CGA.   Ambulate at agility ladder with [facility] while weaving in and out of cones. X 3 laps (down & back). CGA.     PATIENT  EDUCATION:  Education details: Access Code: [email or web address] Person educated: Patient Education method: Explanation, Demonstration, and Handouts Education comprehension: verbalized understanding and returned demonstration  HOME EXERCISE PROGRAM: Access Code: [email or web address] URL: [email or web address] Date: 07/20/2022 Prepared by: Dorcas Carrow  Exercises - Side Stepping with Counter Support  - 1 x daily - 7 x weekly - 2 sets - 10 reps - Standing Hip Extension with Counter Support  - 1 x daily - 7 x weekly - 2 sets - 10 reps - Supine Single Leg Bridge with Sound Leg (AKA)  - 1 x daily - 7 x weekly - 2 sets - 10 reps - Prone Hip Extension with Residual Limb (AKA)  - 1 x daily - 7 x weekly - 2 sets - 10 reps - Sidelying Hip Abduction with Flexion and Extension (AKA)  - 1 x daily - 7 x weekly - 2 sets - 10 reps  ASSESSMENT:  CLINICAL IMPRESSION: Pt. Entered PT with marked improvement in gait pattern while using prosthetic leg. Pt. Requires CGA to ambulate and has progressed to using a SPC. Pt. Was fitted to cane and educated on proper cane use and body mechanics when using SPC for ambulation. Pt. only had one episode of L leg buckling that required PT correction via gait belt. Pt. Was cued to have equal B stride length while assuring to lock out the prosthetic limb to avoid buckling. Pt. Cued to maintain equal weight distribution between UE/LE to avoid compensation of hips or back. Pt. Reports no pain and hoping to return to more independent ambulation.  Pt. Demonstrates good understanding/ technique with step gait pattern with SPC and gait belt required for safety. Pt. Will benefit from skilled PT services to increase LE strength to improve independence and safety with mod. I ambulation.    OBJECTIVE IMPAIRMENTS: Abnormal gait, decreased activity tolerance, decreased balance, decreased endurance, decreased mobility, difficulty walking, decreased ROM, decreased strength, improper body mechanics,  postural dysfunction, and pain.   ACTIVITY LIMITATIONS: carrying, lifting, bending, squatting, stairs, transfers, toileting, dressing, and locomotion level  PARTICIPATION LIMITATIONS: cleaning, laundry, driving, shopping, and community activity  PERSONAL FACTORS: Age, Education, and Past/current experiences are also affecting patient's functional outcome.   REHAB POTENTIAL: Good  CLINICAL DECISION MAKING: Evolving/moderate complexity  EVALUATION COMPLEXITY: Moderate   GOALS: Goals reviewed with patient? Yes  SHORT TERM GOALS: Target date: 08/17/22 Pt. Able to don/doff L prosthetic leg independently with proper use of ply socks to improve fit/ use of prosthetic leg.   Baseline:  requires assist Goal status: INITIAL   LONG TERM GOALS: Target date: 09/14/22  Pt. Will increase FOTO to 57 to improve functional mobility.   Baseline:  initial 46.   Goal status: INITIAL  2.  Pt. Able to ambulate community distances with use of prosthetic leg/ mod. I with least assistive device to improve functional mobility.   Baseline:  CGA for short distances in //-bars.   Goal status: INITIAL  3.  Pt. Able to ascend/ descend 2 flights of stairs with mod. I and use of single handrail safely.   Baseline: TBD Goal status: INITIAL   PLAN:  PT FREQUENCY: 2x/week  PT DURATION: 8 weeks  PLANNED INTERVENTIONS: Therapeutic exercises, Therapeutic activity, Neuromuscular re-education, Balance training, Gait training, Patient/Family education, Self Care, Joint mobilization, Stair training,  Prosthetic training, scar mobilization, Manual therapy, and Re-evaluation  PLAN FOR NEXT SESSION: Progress gait and stair training with SPC, work on more dynamic tasks with SPC               Elmore Hyslop B. Artis Flock, SPT Cammie Mcgee, PT, DPT # [phone] 08/02/2022, 4:09 PM

## 2022-08-04 ENCOUNTER — Ambulatory Visit: Payer: Medicaid Other | Admitting: Physical Therapy

## 2022-08-09 ENCOUNTER — Ambulatory Visit: Payer: Medicaid Other | Admitting: Physical Therapy

## 2022-08-09 DIAGNOSIS — Z89612 Acquired absence of left leg above knee: Secondary | ICD-10-CM

## 2022-08-09 DIAGNOSIS — R269 Unspecified abnormalities of gait and mobility: Secondary | ICD-10-CM

## 2022-08-09 DIAGNOSIS — M6281 Muscle weakness (generalized): Secondary | ICD-10-CM

## 2022-08-09 NOTE — Therapy (Signed)
OUTPATIENT PHYSICAL THERAPY LOWER EXTREMITY TREATMENT   Patient Name: Jocelyn [name] MRN: [id number] DOB:1981-06-25, 42 y.o., female Today's Date: 08/09/2022  END OF SESSION:  PT End of Session - 08/09/22 0945     Visit Number 5    Number of Visits 17    Date for PT Re-Evaluation 09/14/22    PT Start Time 0945    PT Stop Time 1033    PT Time Calculation (min) 48 min    Equipment Utilized During Treatment Gait belt    Activity Tolerance Patient tolerated treatment well    Behavior During Therapy WFL for tasks assessed/performed             Past Medical History:  Diagnosis Date   IDA (iron deficiency anemia) 12/17/2019   Past Surgical History:  Procedure Laterality Date   AMPUTATION Left 01/20/2022   Procedure: AMPUTATION ABOVE KNEE;  Surgeon: Algernon Huxley, MD;  Location: [facility];  Service: Vascular;  Laterality: Left;   APPENDECTOMY     LOWER EXTREMITY ANGIOGRAPHY Left 01/11/2020   Procedure: Lower Extremity Angiography;  Surgeon: Algernon Huxley, MD;  Location: [place] [facility];  Service: Cardiovascular;  Laterality: Left;   LOWER EXTREMITY ANGIOGRAPHY Left 03/06/2020   Procedure: Lower Extremity Angiography;  Surgeon: Algernon Huxley, MD;  Location: [place] [facility];  Service: Cardiovascular;  Laterality: Left;   LOWER EXTREMITY ANGIOGRAPHY Left 03/07/2020   Procedure: Lower Extremity Angiography;  Surgeon: Algernon Huxley, MD;  Location: [place] [facility];  Service: Cardiovascular;  Laterality: Left;   LOWER EXTREMITY ANGIOGRAPHY Left 01/06/2022   Procedure: Lower Extremity Angiography;  Surgeon: Algernon Huxley, MD;  Location: [place] [facility];  Service: Cardiovascular;  Laterality: Left;   LOWER EXTREMITY ANGIOGRAPHY Left 01/07/2022   Procedure: Lower Extremity Angiography;  Surgeon: Algernon Huxley, MD;  Location: [place] [facility];  Service: Cardiovascular;  Laterality: Left;   LOWER EXTREMITY ANGIOGRAPHY Left 01/18/2022   Procedure: Lower Extremity Angiography;   Surgeon: Algernon Huxley, MD;  Location: [place] [facility];  Service: Cardiovascular;  Laterality: Left;   LOWER EXTREMITY INTERVENTION Bilateral 01/17/2022   Procedure: LOWER EXTREMITY INTERVENTION;  Surgeon: Zara Chess, MD;  Location: [place] [facility];  Service: Cardiovascular;  Laterality: Bilateral;   PERIPHERAL VASCULAR THROMBECTOMY Left 01/20/2022   Procedure: PERIPHERAL VASCULAR THROMBECTOMY Left Femoral vein;  Surgeon: Algernon Huxley, MD;  Location: [facility];  Service: Vascular;  Laterality: Left;   Patient Active Problem List   Diagnosis Date Noted   S/P AKA (above knee amputation) unilateral, left ([place]) 01/25/2022   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[place]) 01/19/2022   Acute urinary retention 01/19/2022   Fever 01/18/2022   Acute deep vein thrombosis (DVT) of left femoral vein ([place]) 01/17/2022   Hypomagnesemia 01/06/2022   Hyponatremia 01/06/2022   Critical limb ischemia of left lower extremity ([place]) 01/10/2020   Diabetes mellitus without complication ([place]) 09/30/2246   IDA (iron deficiency anemia) 12/17/2019   HTN (hypertension) 12/17/2019    [facility] [place]  REFERRING PROVIDER: Kris Hartmann, NP  REFERRING DIAG: S/P AKA (above knee amputation) unilateral, left ([place])  THERAPY DIAG:  S/P AKA (above knee amputation) unilateral, left (H[facility])  Muscle weakness (generalized)  Gait difficulty  Rationale for Evaluation and Treatment: Rehabilitation  ONSET DATE: 01/2023  SUBJECTIVE:   SUBJECTIVE STATEMENT: Pt. Reports having increase blood clots after receiving Covid vaccine 3 years ago. Pt. Had multiple surgeries resulting in L AKA on 01/20/22.  Pt. Received  prosthetic leg 1 month ago from [employer].  Pt. Was delayed with receiving leg secondary to insurance issues.  Pt. Has not walked with prosthetic leg at this time.  No c/o pain but phantom limb symptoms.  Pt. Has not been compliant with donning shrinker or HEP.    PERTINENT HISTORY: 1. S/P AKA (above knee  amputation) unilateral, left ([place]) The patient will continue with physical therapy.  Patient will follow-up if there are issues with her residual limb. 2. Primary hypertension Continue antihypertensive medications as already ordered, these medications have been reviewed and there are no changes at this time.    3. Peripheral arterial disease with history of revascularization (HCC)  Recommend:   The patient has evidence of atherosclerosis of the lower extremities with claudication.  The patient does not voice lifestyle limiting changes at this point in time.   Noninvasive studies do not suggest clinically significant change.   No invasive studies, angiography or surgery at this time The patient should continue walking and begin a more formal exercise program.  The patient should continue antiplatelet therapy and aggressive treatment of the lipid abnormalities   No changes in the patient's medications at this time   Continued surveillance is indicated as atherosclerosis is likely to progress with time.     The patient will continue follow up with noninvasive studies as ordered.  The patient follow-up in 6 months or sooner if issues arise.           Current Outpatient Medications on File Prior to Visit  Medication Sig Dispense Refill   acetaminophen (TYLENOL) 325 MG tablet Take 1-2 tablets (325-650 mg total) by mouth every 4 (four) hours as needed for mild pain.       amLODipine (NORVASC) 10 MG tablet Take 1 tablet (10 mg total) by mouth daily. 30 tablet 0   apixaban (ELIQUIS) 5 MG TABS tablet Take 1 tablet (5 mg total) by mouth 2 (two) times daily. 60 tablet 1   gabapentin (NEURONTIN) 600 MG tablet Take 1 tablet (600 mg total) by mouth 3 (three) times daily. 90 tablet 0   methocarbamol (ROBAXIN) 500 MG tablet Take 1 tablet (500 mg total) by mouth every 6 (six) hours as needed for muscle spasms. 60 tablet 0   mupirocin ointment (BACTROBAN) 2 % Apply 1 Application topically daily. 30 g 2    nicotine (NICODERM CQ - DOSED IN MG/24 HOURS) 21 mg/24hr patch Place 1 patch (21 mg total) onto the skin daily. 28 patch 0   oxyCODONE (OXY IR/ROXICODONE) 5 MG immediate release tablet Take 1 tablet (5 mg total) by mouth every 6 (six) hours as needed for severe pain. 28 tablet 0   potassium chloride SA (KLOR-CON M) 20 MEQ tablet Take 1 tablet (20 mEq total) by mouth daily. 30 tablet 0   traZODone (DESYREL) 50 MG tablet Take 0.5-1 tablets (25-50 mg total) by mouth at bedtime as needed for sleep. 10 tablet 0   Ferrous Sulfate (IRON) 325 (65 Fe) MG TABS Take 1 tablet (325 mg total) by mouth daily. 60 tablet 0   hydrochlorothiazide (HYDRODIURIL) 25 MG tablet Take 1 tablet (25 mg total) by mouth daily. 30 tablet 0    No current facility-administered medications on file prior to visit.      There are no Patient Instructions on file for this visit. No follow-ups on file.     Kris Hartmann, NP PAIN:  Are you having pain? No  PRECAUTIONS: Fall  WEIGHT BEARING RESTRICTIONS:  No  FALLS:  Has patient fallen in last 6 months? 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  Pt. Has applied for disability (pending).    PLOF: Independent  PATIENT GOALS: Return to walking with less assistance  NEXT MD VISIT: PRN  OBJECTIVE:   PATIENT SURVEYS:  FOTO initial 46/ goal 52  COGNITION: Overall cognitive status: Within functional limits for tasks assessed     SENSATION: WFL  EDEMA:  Circumferential: 2" superior to template: R 47 cm/ L 51.5 cm.  Recommended shrinker use  MUSCLE LENGTH: Hamstrings: Right WNL; Left WNL  POSTURE: rounded shoulders, esp. When ambulating with RW  PALPATION: No tenderness/ good incision healing.    LOWER EXTREMITY ROM:  Good L LE AROM (all planes).  Prone L hip extension 25  deg.  B UE AROM WNL  LOWER EXTREMITY MMT:  B LE strength grossly 5/5 MMT except L hip adduction/ extension 4/5 MMT.  B UE strength 5/5 MMT.   FUNCTIONAL TESTS:  5 times sit to stand: TBD 6 minute walk test: TBD  GAIT: Distance walked: In clinic/ //-bars Assistive device utilized: Walker - 2 wheeled Level of assistance: CGA Comments: pt. Ambulates in //-bars with cuing to increase a more consistent BOS/ recip. Step pattern with L heel strike to enforce L knee control/ wt. Bearing.  Pt. Ambulates at home without prosthetic leg and use of RW household distances.     TODAY'S TREATMENT:                                                                                                                              DATE: 08/09/22  Subjective:  No c/o L residual limb pain.  Pt. Entered PT with leg donned and using RW to walk into PT gym. Pt. Used SPC throughout the treatment session. Pt. Using 2 ply sock with gel liner today and reported some feelings of looseness (pt. Sees prosthetic Dr. Wednesday).  Pt. Reports she is wearing shrinker on residual limb around 2 hours/day.  Pt. Reports wearing prosthetic to perform daily tasks such as cooking, dishes, and bathroom with no pain or discomfort.   There Ex Warm up:  No charge NuStep L4 5 min. B UE/LE. No reports of pain.   Gait training:  Ambulate in //-bars: (a couple instances of buckling that were corrected by pt.) -With R unilateral UE assist(light) forward- 5 laps.  Mirror feedback and CGA for safety.     -Progressing from R unilateral UE assist(light) to [facility] only with consistent recip. Gait pattern. Cuing to increase step. 5 laps.  - Blue mat pad placed between bars to increase challenge/change terrain. Frwd./lat. walking. Pt. Had increased challenge with lat. Walking. 3 laps each (down/back).   6" step training at staircase without an assistive device.  Pt. used light, unilateral R UE assist(R handle) for functional community ambulation. 5x  up/down. Pt. Had no buckling/LOB.   Ambulate at agility ladder with no assistive device x 5 laps(down/back). Cue stride length with CGA. 1 instance of buckling corrected via PT using gait belt.   PATIENT EDUCATION:  Education details: Access Code: [email or web address] Person educated: Patient Education method: Explanation, Demonstration, and Handouts Education comprehension: verbalized understanding and returned demonstration  HOME EXERCISE PROGRAM: Access Code: [email or web address] URL: [email or web address] Date: 07/20/2022 Prepared by: Dorene Grebe  Exercises - Side Stepping with Counter Support  - 1 x daily - 7 x weekly - 2 sets - 10 reps - Standing Hip Extension with Counter Support  - 1 x daily - 7 x weekly - 2 sets - 10 reps - Supine Single Leg Bridge with Sound Leg (AKA)  - 1 x daily - 7 x weekly - 2 sets - 10 reps - Prone Hip Extension with Residual Limb (AKA)  - 1 x daily - 7 x weekly - 2 sets - 10 reps - Sidelying Hip Abduction with Flexion and Extension (AKA)  - 1 x daily - 7 x weekly - 2 sets - 10 reps  ASSESSMENT:  CLINICAL IMPRESSION: Pt. Continues to make marked improvement in gait pattern while using prosthetic leg. Pt. Requires CGA with gait belt donned to ambulate when using a SPC and is independent with her RW.  Pt. Is able to correct herself 50% of the time in the case of buckling if she has UE support (//-bars or RW).  Pt. Requires PT assist in buckling correction if pt. Is ambulating without an UE assist. Pt. only had one episode of L leg buckling that required PT correction via gait belt. Pt. Was cued to have equal B stride length while assuring to lock out the prosthetic limb to avoid buckling. Pt. Cued to maintain equal weight distribution between UE/LE to avoid compensation of hips or back. Pt. Had increase difficulty with lat. Walking on blue mat pad due to increase single stance time on prosthetic limb. Pt. Reports no pain and hoping to return to more independent  ambulation.  Pt. Demonstrates good understanding/ technique with step gait pattern with SPC and gait belt required for safety. Pt. Will benefit from skilled PT services to increase LE strength to improve independence and safety with ambulation.    OBJECTIVE IMPAIRMENTS: Abnormal gait, decreased activity tolerance, decreased balance, decreased endurance, decreased mobility, difficulty walking, decreased ROM, decreased strength, improper body mechanics, postural dysfunction, and pain.   ACTIVITY LIMITATIONS: carrying, lifting, bending, squatting, stairs, transfers, toileting, dressing, and locomotion level  PARTICIPATION LIMITATIONS: cleaning, laundry, driving, shopping, and community activity  PERSONAL FACTORS: Age, Education, and Past/current experiences are also affecting patient's functional outcome.   REHAB POTENTIAL: Good  CLINICAL DECISION MAKING: Evolving/moderate complexity  EVALUATION COMPLEXITY: Moderate   GOALS: Goals reviewed with patient? Yes  SHORT TERM GOALS: Target date: 08/17/22 Pt. Able to don/doff L prosthetic leg independently with proper use of ply socks to improve fit/ use of prosthetic leg.   Baseline:  1/29: independent Goal status: Goal met   LONG TERM GOALS: Target date: 09/14/22  Pt. Will increase FOTO to 57 to improve functional mobility.   Baseline:  initial 46.   Goal status: INITIAL  2.  Pt. Able to ambulate community distances with use of prosthetic leg/ mod. I with least assistive device to improve functional mobility.   Baseline:  CGA for short distances in //-bars.   Goal status: INITIAL  3.  Pt. Able to ascend/ descend 2 flights of stairs with mod. I and use of single handrail safely.   Baseline: TBD Goal status: INITIAL   PLAN:  PT FREQUENCY: 2x/week  PT DURATION: 8 weeks  PLANNED INTERVENTIONS: Therapeutic exercises, Therapeutic activity, Neuromuscular re-education, Balance training, Gait training, Patient/Family education, Self Care,  Joint mobilization, Stair training, Prosthetic training, scar mobilization, Manual therapy, and Re-evaluation  PLAN FOR NEXT SESSION: Progress gait and stair training with SPC, work on more dynamic tasks with [place]               Lory Galan B. Rogers Blocker, SPT Pura Spice, PT, DPT # [id number] 08/09/2022, 3:00 PM

## 2022-08-11 ENCOUNTER — Ambulatory Visit: Payer: Medicaid Other | Admitting: Physical Therapy

## 2022-08-11 DIAGNOSIS — Z89612 Acquired absence of left leg above knee: Secondary | ICD-10-CM | POA: Diagnosis not present

## 2022-08-11 DIAGNOSIS — M6281 Muscle weakness (generalized): Secondary | ICD-10-CM

## 2022-08-11 DIAGNOSIS — R269 Unspecified abnormalities of gait and mobility: Secondary | ICD-10-CM

## 2022-08-11 NOTE — Therapy (Signed)
OUTPATIENT PHYSICAL THERAPY LOWER EXTREMITY TREATMENT   Patient Name: Jocelyn [name] MRN: [id number] DOB:[birth year], 42 y.o., female Today's Date: 08/11/2022  END OF SESSION:  PT End of Session - 08/11/22 1118     Visit Number 6    Number of Visits 17    Date for PT Re-Evaluation 09/14/22    PT Start Time 1117    PT Stop Time 1202    PT Time Calculation (min) 45 min    Equipment Utilized During Treatment Gait belt    Activity Tolerance Patient tolerated treatment well    Behavior During Therapy WFL for tasks assessed/performed             Past Medical History:  Diagnosis Date   IDA (iron deficiency anemia) 12/17/2019   Past Surgical History:  Procedure Laterality Date   AMPUTATION Left 01/20/2022   Procedure: AMPUTATION ABOVE KNEE;  Surgeon: Algernon Huxley, MD;  Location: [facility];  Service: Vascular;  Laterality: Left;   APPENDECTOMY     LOWER EXTREMITY ANGIOGRAPHY Left 01/11/2020   Procedure: Lower Extremity Angiography;  Surgeon: Algernon Huxley, MD;  Location: [place] [facility];  Service: Cardiovascular;  Laterality: Left;   LOWER EXTREMITY ANGIOGRAPHY Left 03/06/2020   Procedure: Lower Extremity Angiography;  Surgeon: Algernon Huxley, MD;  Location: [place] [facility];  Service: Cardiovascular;  Laterality: Left;   LOWER EXTREMITY ANGIOGRAPHY Left 03/07/2020   Procedure: Lower Extremity Angiography;  Surgeon: Algernon Huxley, MD;  Location: [place] [facility];  Service: Cardiovascular;  Laterality: Left;   LOWER EXTREMITY ANGIOGRAPHY Left 01/06/2022   Procedure: Lower Extremity Angiography;  Surgeon: Algernon Huxley, MD;  Location: [place] [facility];  Service: Cardiovascular;  Laterality: Left;   LOWER EXTREMITY ANGIOGRAPHY Left 01/07/2022   Procedure: Lower Extremity Angiography;  Surgeon: Algernon Huxley, MD;  Location: [place] [facility];  Service: Cardiovascular;  Laterality: Left;   LOWER EXTREMITY ANGIOGRAPHY Left 01/18/2022   Procedure: Lower Extremity Angiography;   Surgeon: Algernon Huxley, MD;  Location: [place] [facility];  Service: Cardiovascular;  Laterality: Left;   LOWER EXTREMITY INTERVENTION Bilateral 01/17/2022   Procedure: LOWER EXTREMITY INTERVENTION;  Surgeon: Zara Chess, MD;  Location: [place] [facility];  Service: Cardiovascular;  Laterality: Bilateral;   PERIPHERAL VASCULAR THROMBECTOMY Left 01/20/2022   Procedure: PERIPHERAL VASCULAR THROMBECTOMY Left Femoral vein;  Surgeon: Algernon Huxley, MD;  Location: [facility];  Service: Vascular;  Laterality: Left;   Patient Active Problem List   Diagnosis Date Noted   S/P AKA (above knee amputation) unilateral, left ([place]) 01/25/2022   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  REFERRING PROVIDER: Kris Hartmann, NP  REFERRING DIAG: S/P AKA (above knee amputation) unilateral, left ([place])  THERAPY DIAG:  S/P AKA (above knee amputation) unilateral, left ([facility])  Muscle weakness (generalized)  Gait difficulty  Rationale for Evaluation and Treatment: Rehabilitation  ONSET DATE: 01/2023  SUBJECTIVE:   SUBJECTIVE STATEMENT: Pt. Reports having increase blood clots after receiving Covid vaccine 3 years ago. Pt. Had multiple surgeries resulting in L AKA on 01/20/22.  Pt. Received  prosthetic leg 1 month ago from [employer].  Pt. Was delayed with receiving leg secondary to insurance issues.  Pt. Has not walked with prosthetic leg at this time.  No c/o pain but phantom limb symptoms.  Pt. Has not been compliant with donning shrinker or HEP.    PERTINENT HISTORY: 1. S/P AKA (above knee  amputation) unilateral, left ([place]) The patient will continue with physical therapy.  Patient will follow-up if there are issues with her residual limb. 2. Primary hypertension Continue antihypertensive medications as already ordered, these medications have been reviewed and there are no changes at this time.    3. Peripheral arterial disease with history of revascularization (HCC)  Recommend:   The patient has evidence of atherosclerosis of the lower extremities with claudication.  The patient does not voice lifestyle limiting changes at this point in time.   Noninvasive studies do not suggest clinically significant change.   No invasive studies, angiography or surgery at this time The patient should continue walking and begin a more formal exercise program.  The patient should continue antiplatelet therapy and aggressive treatment of the lipid abnormalities   No changes in the patient's medications at this time   Continued surveillance is indicated as atherosclerosis is likely to progress with time.     The patient will continue follow up with noninvasive studies as ordered.  The patient follow-up in 6 months or sooner if issues arise.           Current Outpatient Medications on File Prior to Visit  Medication Sig Dispense Refill   acetaminophen (TYLENOL) 325 MG tablet Take 1-2 tablets (325-650 mg total) by mouth every 4 (four) hours as needed for mild pain.       amLODipine (NORVASC) 10 MG tablet Take 1 tablet (10 mg total) by mouth daily. 30 tablet 0   apixaban (ELIQUIS) 5 MG TABS tablet Take 1 tablet (5 mg total) by mouth 2 (two) times daily. 60 tablet 1   gabapentin (NEURONTIN) 600 MG tablet Take 1 tablet (600 mg total) by mouth 3 (three) times daily. 90 tablet 0   methocarbamol (ROBAXIN) 500 MG tablet Take 1 tablet (500 mg total) by mouth every 6 (six) hours as needed for muscle spasms. 60 tablet 0   mupirocin ointment (BACTROBAN) 2 % Apply 1 Application topically daily. 30 g 2    nicotine (NICODERM CQ - DOSED IN MG/24 HOURS) 21 mg/24hr patch Place 1 patch (21 mg total) onto the skin daily. 28 patch 0   oxyCODONE (OXY IR/ROXICODONE) 5 MG immediate release tablet Take 1 tablet (5 mg total) by mouth every 6 (six) hours as needed for severe pain. 28 tablet 0   potassium chloride SA (KLOR-CON M) 20 MEQ tablet Take 1 tablet (20 mEq total) by mouth daily. 30 tablet 0   traZODone (DESYREL) 50 MG tablet Take 0.5-1 tablets (25-50 mg total) by mouth at bedtime as needed for sleep. 10 tablet 0   Ferrous Sulfate (IRON) 325 (65 Fe) MG TABS Take 1 tablet (325 mg total) by mouth daily. 60 tablet 0   hydrochlorothiazide (HYDRODIURIL) 25 MG tablet Take 1 tablet (25 mg total) by mouth daily. 30 tablet 0    No current facility-administered medications on file prior to visit.      There are no Patient Instructions on file for this visit. No follow-ups on file.     Kris Hartmann, NP PAIN:  Are you having pain? No  PRECAUTIONS: Fall  WEIGHT BEARING RESTRICTIONS:  No  FALLS:  Has patient fallen in last 6 months? 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  Pt. Has applied for disability (pending).    PLOF: Independent  PATIENT GOALS: Return to walking with less assistance  NEXT MD VISIT: PRN  OBJECTIVE:   PATIENT SURVEYS:  FOTO initial 46/ goal 2  COGNITION: Overall cognitive status: Within functional limits for tasks assessed     SENSATION: WFL  EDEMA:  Circumferential: 2" superior to template: R 47 cm/ L 51.5 cm.  Recommended shrinker use  MUSCLE LENGTH: Hamstrings: Right WNL; Left WNL  POSTURE: rounded shoulders, esp. When ambulating with RW  PALPATION: No tenderness/ good incision healing.    LOWER EXTREMITY ROM:  Good L LE AROM (all planes).  Prone L hip extension 25  deg.  B UE AROM WNL  LOWER EXTREMITY MMT:  B LE strength grossly 5/5 MMT except L hip adduction/ extension 4/5 MMT.  B UE strength 5/5 MMT.   FUNCTIONAL TESTS:  5 times sit to stand: TBD 6 minute walk test: TBD  GAIT: Distance walked: In clinic/ //-bars Assistive device utilized: Walker - 2 wheeled Level of assistance: CGA Comments: pt. Ambulates in //-bars with cuing to increase a more consistent BOS/ recip. Step pattern with L heel strike to enforce L knee control/ wt. Bearing.  Pt. Ambulates at home without prosthetic leg and use of RW household distances.     TODAY'S TREATMENT:                                                                                                                              DATE: 08/11/22  Subjective:  Pt. Arrived to PT with no complaints of pain or discomfort. Pt. Stated she went to see her prosthetic specialist earlier in the day and he adjusted the fit of her prosthetic. Pt. States her leg feels tighter now and gives her a feeling of more control. Pt. States ambulating in her home without any assistive device as well as being able to pick up her grand daughter with no complaints.  There Ex Warm up:  No charge NuStep L4 5 min. B UE/LE. No reports of pain.   Gait training:  Ambulate in //-bars: (a couple instances of buckling that were corrected by pt. Otherwise no UE assist/assistive device needed/used) - Mirror feedback and CGA for safety. :  -Frwd./Bkwd./lat. Walking progressing from [facility] only to independent walking. Pt. Beginning to demonstrates some consistent recip. Gait pattern. PT cuing to increase step length as she's able. 5 laps each.  - Blue mat pad placed between bars to increase challenge/change terrain. Frwd./lat. walking. 5 laps each (down/back).  - Frwd. Walking with the blue mat pad placed between //-bars with obstacles added underneath mat to mimic uneven terrain to increase  challenge/change terrain. 5 laps each (down/back).   PATIENT  EDUCATION:  Education details: Access Code: [email or web address] Person educated: Patient Education method: Explanation, Demonstration, and Handouts Education comprehension: verbalized understanding and returned demonstration  HOME EXERCISE PROGRAM: Access Code: [email or web address] URL: [email or web address] Date: 07/20/2022 Prepared by: Dorcas Carrow  Exercises - Side Stepping with Counter Support  - 1 x daily - 7 x weekly - 2 sets - 10 reps - Standing Hip Extension with Counter Support  - 1 x daily - 7 x weekly - 2 sets - 10 reps - Supine Single Leg Bridge with Sound Leg (AKA)  - 1 x daily - 7 x weekly - 2 sets - 10 reps - Prone Hip Extension with Residual Limb (AKA)  - 1 x daily - 7 x weekly - 2 sets - 10 reps - Sidelying Hip Abduction with Flexion and Extension (AKA)  - 1 x daily - 7 x weekly - 2 sets - 10 reps  ASSESSMENT:  CLINICAL IMPRESSION: Pt. Continues to make marked improvement in gait pattern while using prosthetic leg. Pt. Requires CGA with gait belt donned to ambulate but is completing PT sessions without an assistive device. Pt. Is increasing the amount of times she is able to correct herself in the case of buckling without the use of UE support. Pt. Did not have any episodes during gait today that required PT correction via gait belt. Pt. Was cued to have equal B stride length while assuring to lock out the prosthetic limb to avoid buckling. Pt. Cued to maintain equal weight distribution between UE/LE to avoid compensation of hips or back. Pt. Was challenged with uneven terrain today with only occasional instances of buckling that were pt. Corrected without PT assist. Pt. Reports no pain and hoping to return to more independent ambulation. Pt. Demonstrates good understanding/ technique with step gait pattern with SPC and gait belt required for safety. Pt. Will benefit from skilled PT services to increase LE strength to improve independence and safety with ambulation.    OBJECTIVE  IMPAIRMENTS: Abnormal gait, decreased activity tolerance, decreased balance, decreased endurance, decreased mobility, difficulty walking, decreased ROM, decreased strength, improper body mechanics, postural dysfunction, and pain.   ACTIVITY LIMITATIONS: carrying, lifting, bending, squatting, stairs, transfers, toileting, dressing, and locomotion level  PARTICIPATION LIMITATIONS: cleaning, laundry, driving, shopping, and community activity  PERSONAL FACTORS: Age, Education, and Past/current experiences are also affecting patient's functional outcome.   REHAB POTENTIAL: Good  CLINICAL DECISION MAKING: Evolving/moderate complexity  EVALUATION COMPLEXITY: Moderate   GOALS: Goals reviewed with patient? Yes  SHORT TERM GOALS: Target date: 08/17/22 Pt. Able to don/doff L prosthetic leg independently with proper use of ply socks to improve fit/ use of prosthetic leg.   Baseline:  1/29: independent Goal status: Goal met   LONG TERM GOALS: Target date: 09/14/22  Pt. Will increase FOTO to 57 to improve functional mobility.   Baseline:  initial 46.   Goal status: INITIAL  2.  Pt. Able to ambulate community distances with use of prosthetic leg/ mod. I with least assistive device to improve functional mobility.   Baseline:  CGA for short distances in //-bars.   Goal status: INITIAL  3.  Pt. Able to ascend/ descend 2 flights of stairs with mod. I and use of single handrail safely.   Baseline: TBD Goal status: INITIAL   PLAN:  PT FREQUENCY: 2x/week  PT DURATION: 8 weeks  PLANNED INTERVENTIONS: Therapeutic exercises, Therapeutic activity, Neuromuscular re-education, Balance training, Gait training, Patient/Family education, Self Care, Joint  mobilization, Stair training, Prosthetic training, scar mobilization, Manual therapy, and Re-evaluation  PLAN FOR NEXT SESSION: Progress gait and stair training without assistive device, work on more dynamic tasks                Mclean Moya B. Rogers Blocker,  SPT Pura Spice, PT, DPT # [phone] 08/11/2022, 12:40 PM

## 2022-08-16 ENCOUNTER — Ambulatory Visit: Payer: Medicaid Other | Attending: Nurse Practitioner | Admitting: Physical Therapy

## 2022-08-16 DIAGNOSIS — Z89612 Acquired absence of left leg above knee: Secondary | ICD-10-CM | POA: Insufficient documentation

## 2022-08-16 DIAGNOSIS — M6281 Muscle weakness (generalized): Secondary | ICD-10-CM | POA: Insufficient documentation

## 2022-08-16 DIAGNOSIS — R269 Unspecified abnormalities of gait and mobility: Secondary | ICD-10-CM | POA: Insufficient documentation

## 2022-08-18 ENCOUNTER — Ambulatory Visit: Payer: Medicaid Other | Admitting: Physical Therapy

## 2022-08-18 DIAGNOSIS — M6281 Muscle weakness (generalized): Secondary | ICD-10-CM | POA: Diagnosis present

## 2022-08-18 DIAGNOSIS — Z89612 Acquired absence of left leg above knee: Secondary | ICD-10-CM

## 2022-08-18 DIAGNOSIS — R269 Unspecified abnormalities of gait and mobility: Secondary | ICD-10-CM

## 2022-08-18 NOTE — Therapy (Unsigned)
OUTPATIENT PHYSICAL THERAPY LOWER EXTREMITY TREATMENT   Patient Name: Jocelyn [name] MRN: [id number] DOB:01/26/1981, 42 y.o., female Today's Date: 08/18/2022  END OF SESSION:  PT End of Session - 08/18/22 0945     Visit Number 7    Number of Visits 17    Date for PT Re-Evaluation 09/14/22    PT Start Time 0943    PT Stop Time 1029    PT Time Calculation (min) 46 min    Equipment Utilized During Treatment Gait belt    Activity Tolerance Patient tolerated treatment well    Behavior During Therapy WFL for tasks assessed/performed             Past Medical History:  Diagnosis Date   IDA (iron deficiency anemia) 12/17/2019   Past Surgical History:  Procedure Laterality Date   AMPUTATION Left 01/20/2022   Procedure: AMPUTATION ABOVE KNEE;  Surgeon: Annice Needy, MD;  Location: [facility];  Service: Vascular;  Laterality: Left;   APPENDECTOMY     LOWER EXTREMITY ANGIOGRAPHY Left 01/11/2020   Procedure: Lower Extremity Angiography;  Surgeon: Annice Needy, MD;  Location: [facility];  Service: Cardiovascular;  Laterality: Left;   LOWER EXTREMITY ANGIOGRAPHY Left 03/06/2020   Procedure: Lower Extremity Angiography;  Surgeon: Annice Needy, MD;  Location: [facility];  Service: Cardiovascular;  Laterality: Left;   LOWER EXTREMITY ANGIOGRAPHY Left 03/07/2020   Procedure: Lower Extremity Angiography;  Surgeon: Annice Needy, MD;  Location: [facility];  Service: Cardiovascular;  Laterality: Left;   LOWER EXTREMITY ANGIOGRAPHY Left 01/06/2022   Procedure: Lower Extremity Angiography;  Surgeon: Annice Needy, MD;  Location: [facility];  Service: Cardiovascular;  Laterality: Left;   LOWER EXTREMITY ANGIOGRAPHY Left 01/07/2022   Procedure: Lower Extremity Angiography;  Surgeon: Annice Needy, MD;  Location: [facility];  Service: Cardiovascular;  Laterality: Left;   LOWER EXTREMITY ANGIOGRAPHY Left 01/18/2022   Procedure: Lower Extremity Angiography;   Surgeon: Annice Needy, MD;  Location: [facility];  Service: Cardiovascular;  Laterality: Left;   LOWER EXTREMITY INTERVENTION Bilateral 01/17/2022   Procedure: LOWER EXTREMITY INTERVENTION;  Surgeon: Learta Codding, MD;  Location: [facility];  Service: Cardiovascular;  Laterality: Bilateral;   PERIPHERAL VASCULAR THROMBECTOMY Left 01/20/2022   Procedure: PERIPHERAL VASCULAR THROMBECTOMY Left Femoral vein;  Surgeon: Annice Needy, MD;  Location: [facility];  Service: Vascular;  Laterality: Left;   Patient Active Problem List   Diagnosis Date Noted   S/P AKA (above knee amputation) unilateral, left (HCC) 01/25/2022   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   Acute urinary retention 01/19/2022   Fever 01/18/2022   Acute deep vein thrombosis (DVT) of left femoral vein (HCC) 01/17/2022   Hypomagnesemia 01/06/2022   Hyponatremia 01/06/2022   Critical limb ischemia of left lower extremity (HCC) 01/10/2020   Diabetes mellitus without complication (HCC) 12/17/2019   IDA (iron deficiency anemia) 12/17/2019   HTN (hypertension) 12/17/2019    PCP: [facility]  REFERRING PROVIDER: Georgiana Spinner, NP  REFERRING DIAG: S/P AKA (above knee amputation) unilateral, left (HCC)  THERAPY DIAG:  S/P AKA (above knee amputation) unilateral, left (HCC)  Muscle weakness (generalized)  Gait difficulty  Rationale for Evaluation and Treatment: Rehabilitation  ONSET DATE: 01/2023  SUBJECTIVE:   SUBJECTIVE STATEMENT: Pt. Reports having increase blood clots after receiving Covid vaccine 3 years ago. Pt. Had multiple surgeries resulting in L AKA on 01/20/22.  Pt. Received  prosthetic leg 1 month ago from [employer].  Pt. Was delayed with receiving leg secondary to insurance issues.  Pt. Has not walked with prosthetic leg at this time.  No c/o pain but phantom limb symptoms.  Pt. Has not been compliant with donning shrinker or HEP.    PERTINENT HISTORY: 1. S/P AKA (above knee  amputation) unilateral, left ([place]) The patient will continue with physical therapy.  Patient will follow-up if there are issues with her residual limb. 2. Primary hypertension Continue antihypertensive medications as already ordered, these medications have been reviewed and there are no changes at this time.    3. Peripheral arterial disease with history of revascularization (HCC)  Recommend:   The patient has evidence of atherosclerosis of the lower extremities with claudication.  The patient does not voice lifestyle limiting changes at this point in time.   Noninvasive studies do not suggest clinically significant change.   No invasive studies, angiography or surgery at this time The patient should continue walking and begin a more formal exercise program.  The patient should continue antiplatelet therapy and aggressive treatment of the lipid abnormalities   No changes in the patient's medications at this time   Continued surveillance is indicated as atherosclerosis is likely to progress with time.     The patient will continue follow up with noninvasive studies as ordered.  The patient follow-up in 6 months or sooner if issues arise.           Current Outpatient Medications on File Prior to Visit  Medication Sig Dispense Refill   acetaminophen (TYLENOL) 325 MG tablet Take 1-2 tablets (325-650 mg total) by mouth every 4 (four) hours as needed for mild pain.       amLODipine (NORVASC) 10 MG tablet Take 1 tablet (10 mg total) by mouth daily. 30 tablet 0   apixaban (ELIQUIS) 5 MG TABS tablet Take 1 tablet (5 mg total) by mouth 2 (two) times daily. 60 tablet 1   gabapentin (NEURONTIN) 600 MG tablet Take 1 tablet (600 mg total) by mouth 3 (three) times daily. 90 tablet 0   methocarbamol (ROBAXIN) 500 MG tablet Take 1 tablet (500 mg total) by mouth every 6 (six) hours as needed for muscle spasms. 60 tablet 0   mupirocin ointment (BACTROBAN) 2 % Apply 1 Application topically daily. 30 g 2    nicotine (NICODERM CQ - DOSED IN MG/24 HOURS) 21 mg/24hr patch Place 1 patch (21 mg total) onto the skin daily. 28 patch 0   oxyCODONE (OXY IR/ROXICODONE) 5 MG immediate release tablet Take 1 tablet (5 mg total) by mouth every 6 (six) hours as needed for severe pain. 28 tablet 0   potassium chloride SA (KLOR-CON M) 20 MEQ tablet Take 1 tablet (20 mEq total) by mouth daily. 30 tablet 0   traZODone (DESYREL) 50 MG tablet Take 0.5-1 tablets (25-50 mg total) by mouth at bedtime as needed for sleep. 10 tablet 0   Ferrous Sulfate (IRON) 325 (65 Fe) MG TABS Take 1 tablet (325 mg total) by mouth daily. 60 tablet 0   hydrochlorothiazide (HYDRODIURIL) 25 MG tablet Take 1 tablet (25 mg total) by mouth daily. 30 tablet 0    No current facility-administered medications on file prior to visit.      There are no Patient Instructions on file for this visit. No follow-ups on file.     Kris Hartmann, NP PAIN:  Are you having pain? No  PRECAUTIONS: Fall  WEIGHT BEARING RESTRICTIONS:  No  FALLS:  Has patient fallen in last 6 months? 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 Has applied for disability (pending).    PLOF: Independent  PATIENT GOALS: Return to walking with less assistance  NEXT MD VISIT: PRN  OBJECTIVE:   PATIENT SURVEYS:  FOTO initial 46/ goal 26  COGNITION: Overall cognitive status: Within functional limits for tasks assessed     SENSATION: WFL  EDEMA:  Circumferential: 2" superior to template: R 47 cm/ L 51.5 cm.  Recommended shrinker use  MUSCLE LENGTH: Hamstrings: Right WNL; Left WNL  POSTURE: rounded shoulders, esp. When ambulating with RW  PALPATION: No tenderness/ good incision healing.    LOWER EXTREMITY ROM:  Good L LE AROM (all planes).  Prone L hip extension 25  deg.  B UE AROM WNL  LOWER EXTREMITY MMT:  B LE strength grossly 5/5 MMT except L hip adduction/ extension 4/5 MMT.  B UE strength 5/5 MMT.   FUNCTIONAL TESTS:  5 times sit to stand: TBD 6 minute walk test: TBD  GAIT: Distance walked: In clinic/ //-bars Assistive device utilized: Walker - 2 wheeled Level of assistance: CGA Comments: pt. Ambulates in //-bars with cuing to increase a more consistent BOS/ recip. Step pattern with L heel strike to enforce L knee control/ wt. Bearing.  Pt. Ambulates at home without prosthetic leg and use of RW household distances.     TODAY'S TREATMENT:                                                                                                                              DATE: 08/18/22  Subjective:  Pt. Arrived to PT with prosthetic donned and no complaints of pain or discomfort. Pt. Stated she is happy with the way her prosthetic is currently fitting. Pt. Only wore her leg two days since the last visit due to some personal issues. Pt. Is able to complete all of her personal hygiene tasks independently with use of RW 50% of the time and without any UE assistance 50% of the time.     There Ex Warm up:  No charge NuStep L4 10 min. B UE/LE. Seat 9, arms 8. No reports of pain.   Gait training:  Ambulate in hallway with no assistive device with gait belt donned for safety x5 full laps. Pt. had some instances of prosthetic knee mechanism buckling requiring PT correction via gait belt.   Stair training - 1x5(up/down) with B. UE support & 1x5(up/down) with Unilateral UE support for community ambulation.   Agility ladder weaving between cones 1 lap(down/back) for training of fine movements of knee mechanism.  Agility ladder lateral stepping with ladder as cue for step length. 3 laps (down and back).   PATIENT EDUCATION:  Education details:  Access Code: [email or web address] Person educated: Patient Education method: Explanation, Demonstration, and Handouts Education  comprehension: verbalized understanding and returned demonstration  HOME EXERCISE PROGRAM: Access Code: [email or web address] URL: [email or web address] Date: 07/20/2022 Prepared by: Dorcas Carrow  Exercises - Side Stepping with Counter Support  - 1 x daily - 7 x weekly - 2 sets - 10 reps - Standing Hip Extension with Counter Support  - 1 x daily - 7 x weekly - 2 sets - 10 reps - Supine Single Leg Bridge with Sound Leg (AKA)  - 1 x daily - 7 x weekly - 2 sets - 10 reps - Prone Hip Extension with Residual Limb (AKA)  - 1 x daily - 7 x weekly - 2 sets - 10 reps - Sidelying Hip Abduction with Flexion and Extension (AKA)  - 1 x daily - 7 x weekly - 2 sets - 10 reps  ASSESSMENT:  CLINICAL IMPRESSION: Pt. Continues to show marked improvement in gait pattern while using prosthetic leg. Pt. Requires CGA with gait belt donned to ambulate but is completing PT sessions without an assistive device. Pt. Is increasing the amount of times she is able to correct herself in the case of buckling without the use of UE support. Pt. Had several episodes of her prosthetic knee mechanism buckling today that required PT correction via gait belt. PT is continuing to challenge pt. With more difficult dynamic tasks causing the increase in buckling. Pt. Was able to correct herself and avoid buckling about 50% of the time without PT assistance. Pt. Was cued to have equal B stride length while assuring to lock out the prosthetic limb to avoid buckling. Pt. Cued to maintain equal weight distribution between UE/LE to avoid compensation of hips or back. Pt. Was challenged with tedious maneuvering activities via cone weaving in agility ladder for training of fine movements of knee mechanism with no instances of buckling requiring correction.  Pt. Reports no pain and hoping to return to more independent ambulation. Pt. Is continuing to advance her gait at home and is using her SPC more than her RW. Pt. Demonstrates good  understanding/ technique with step gait pattern with SPC and gait belt required for safety. Pt. Will benefit from skilled PT services to increase LE strength to improve independence and safety with ambulation.    OBJECTIVE IMPAIRMENTS: Abnormal gait, decreased activity tolerance, decreased balance, decreased endurance, decreased mobility, difficulty walking, decreased ROM, decreased strength, improper body mechanics, postural dysfunction, and pain.   ACTIVITY LIMITATIONS: carrying, lifting, bending, squatting, stairs, transfers, toileting, dressing, and locomotion level  PARTICIPATION LIMITATIONS: cleaning, laundry, driving, shopping, and community activity  PERSONAL FACTORS: Age, Education, and Past/current experiences are also affecting patient's functional outcome.   REHAB POTENTIAL: Good  CLINICAL DECISION MAKING: Evolving/moderate complexity  EVALUATION COMPLEXITY: Moderate   GOALS: Goals reviewed with patient? Yes  SHORT TERM GOALS: Target date: 08/17/22 Pt. Able to don/doff L prosthetic leg independently with proper use of ply socks to improve fit/ use of prosthetic leg.   Baseline:  1/29: independent Goal status: Goal met   LONG TERM GOALS: Target date: 09/14/22  Pt. Will increase FOTO to 57 to improve functional mobility.   Baseline:  initial 46.   Goal status: INITIAL  2.  Pt. Able to ambulate community distances with use of prosthetic leg/ mod. I with least assistive device to improve functional mobility.   Baseline:  CGA for short distances in //-bars.   Goal status: INITIAL  3.  Pt. Able to ascend/ descend 2  flights of stairs with mod. I and use of single handrail safely.   Baseline: TBD Goal status: INITIAL   PLAN:  PT FREQUENCY: 2x/week  PT DURATION: 8 weeks  PLANNED INTERVENTIONS: Therapeutic exercises, Therapeutic activity, Neuromuscular re-education, Balance training, Gait training, Patient/Family education, Self Care, Joint mobilization, Stair training,  Prosthetic training, scar mobilization, Manual therapy, and Re-evaluation  PLAN FOR NEXT SESSION: Progress gait and stair training without assistive device, work on more dynamic tasks                Jan Olano B. Rogers Blocker, SPT Pura Spice, PT, DPT # [phone] 08/18/2022, 10:25 AM

## 2022-08-23 ENCOUNTER — Ambulatory Visit: Payer: Medicaid Other | Admitting: Physical Therapy

## 2022-08-25 ENCOUNTER — Encounter: Payer: Medicaid Other | Admitting: Physical Therapy

## 2022-08-30 ENCOUNTER — Ambulatory Visit: Payer: Medicaid Other | Admitting: Physical Therapy

## 2022-09-01 ENCOUNTER — Ambulatory Visit: Payer: Medicaid Other | Admitting: Physical Therapy

## 2022-09-06 ENCOUNTER — Ambulatory Visit: Payer: Medicaid Other | Admitting: Physical Therapy

## 2022-09-08 ENCOUNTER — Ambulatory Visit: Payer: Medicaid Other | Admitting: Physical Therapy

## 2022-10-21 ENCOUNTER — Ambulatory Visit: Payer: Medicaid Other | Attending: Nurse Practitioner | Admitting: Physical Therapy

## 2022-10-21 ENCOUNTER — Encounter: Payer: Self-pay | Admitting: Physical Therapy

## 2022-10-21 DIAGNOSIS — M6281 Muscle weakness (generalized): Secondary | ICD-10-CM | POA: Diagnosis present

## 2022-10-21 DIAGNOSIS — R269 Unspecified abnormalities of gait and mobility: Secondary | ICD-10-CM | POA: Diagnosis present

## 2022-10-21 DIAGNOSIS — Z89612 Acquired absence of left leg above knee: Secondary | ICD-10-CM | POA: Diagnosis present

## 2022-10-21 NOTE — Therapy (Signed)
OUTPATIENT PHYSICAL THERAPY LOWER EXTREMITY TREATMENT/ RECERTIFICATION   Patient Name: Jocelyn [name] MRN: [id number] DOB:Jun 30, 1981, 42 y.o., female Today's Date: 10/21/2022  END OF SESSION:  PT End of Session - 10/21/22 1818     Visit Number 8    Number of Visits 12    Date for PT Re-Evaluation 12/16/22    PT Start Time 1348    PT Stop Time 1434    PT Time Calculation (min) 46 min    Equipment Utilized During Treatment Gait belt    Activity Tolerance Patient tolerated treatment well    Behavior During Therapy WFL for tasks assessed/performed            Past Medical History:  Diagnosis Date   IDA (iron deficiency anemia) 12/17/2019   Past Surgical History:  Procedure Laterality Date   AMPUTATION Left 01/20/2022   Procedure: AMPUTATION ABOVE KNEE;  Surgeon: Annice Needy, MD;  Location: [facility];  Service: Vascular;  Laterality: Left;   APPENDECTOMY     LOWER EXTREMITY ANGIOGRAPHY Left 01/11/2020   Procedure: Lower Extremity Angiography;  Surgeon: Annice Needy, MD;  Location: [facility];  Service: Cardiovascular;  Laterality: Left;   LOWER EXTREMITY ANGIOGRAPHY Left 03/06/2020   Procedure: Lower Extremity Angiography;  Surgeon: Annice Needy, MD;  Location: [facility];  Service: Cardiovascular;  Laterality: Left;   LOWER EXTREMITY ANGIOGRAPHY Left 03/07/2020   Procedure: Lower Extremity Angiography;  Surgeon: Annice Needy, MD;  Location: [facility];  Service: Cardiovascular;  Laterality: Left;   LOWER EXTREMITY ANGIOGRAPHY Left 01/06/2022   Procedure: Lower Extremity Angiography;  Surgeon: Annice Needy, MD;  Location: [facility];  Service: Cardiovascular;  Laterality: Left;   LOWER EXTREMITY ANGIOGRAPHY Left 01/07/2022   Procedure: Lower Extremity Angiography;  Surgeon: Annice Needy, MD;  Location: [facility];  Service: Cardiovascular;  Laterality: Left;   LOWER EXTREMITY ANGIOGRAPHY Left 01/18/2022   Procedure: Lower Extremity  Angiography;  Surgeon: Annice Needy, MD;  Location: [facility];  Service: Cardiovascular;  Laterality: Left;   LOWER EXTREMITY INTERVENTION Bilateral 01/17/2022   Procedure: LOWER EXTREMITY INTERVENTION;  Surgeon: Learta Codding, MD;  Location: [facility];  Service: Cardiovascular;  Laterality: Bilateral;   PERIPHERAL VASCULAR THROMBECTOMY Left 01/20/2022   Procedure: PERIPHERAL VASCULAR THROMBECTOMY Left Femoral vein;  Surgeon: Annice Needy, MD;  Location: [facility];  Service: Vascular;  Laterality: Left;   Patient Active Problem List   Diagnosis Date Noted   S/P AKA (above knee amputation) unilateral, left 01/25/2022   Wet gangrene 01/19/2022   Acute urinary retention 01/19/2022   Fever 01/18/2022   Acute deep vein thrombosis (DVT) of left femoral vein 01/17/2022   Hypomagnesemia 01/06/2022   Hyponatremia 01/06/2022   Critical limb ischemia of left lower extremity 01/10/2020   Diabetes mellitus without complication 12/17/2019   IDA (iron deficiency anemia) 12/17/2019   HTN (hypertension) 12/17/2019    PCP: [facility]  REFERRING PROVIDER: Georgiana Spinner, NP  REFERRING DIAG: S/P AKA (above knee amputation) unilateral, left (HCC)  THERAPY DIAG:  S/P AKA (above knee amputation) unilateral, left  Muscle weakness (generalized)  Gait difficulty  Rationale for Evaluation and Treatment: Rehabilitation  ONSET DATE: 01/2023  SUBJECTIVE:   SUBJECTIVE STATEMENT: Pt. Reports having increase blood clots after receiving Covid vaccine 3 years ago. Pt. Had multiple surgeries resulting in L AKA on 01/20/22.  Pt. Received prosthetic leg 1 month ago from  [facility].  Pt. Was delayed with receiving leg secondary to insurance issues.  Pt. Has not walked with prosthetic leg at this time.  No c/o pain but phantom limb symptoms.  Pt. Has not been compliant with donning shrinker or HEP.    PERTINENT HISTORY: 1. S/P AKA (above knee amputation) unilateral, left  (HCC) The patient will continue with physical therapy.  Patient will follow-up if there are issues with her residual limb. 2. Primary hypertension Continue antihypertensive medications as already ordered, these medications have been reviewed and there are no changes at this time.    3. Peripheral arterial disease with history of revascularization (HCC)  Recommend:   The patient has evidence of atherosclerosis of the lower extremities with claudication.  The patient does not voice lifestyle limiting changes at this point in time.   Noninvasive studies do not suggest clinically significant change.   No invasive studies, angiography or surgery at this time The patient should continue walking and begin a more formal exercise program.  The patient should continue antiplatelet therapy and aggressive treatment of the lipid abnormalities   No changes in the patient's medications at this time   Continued surveillance is indicated as atherosclerosis is likely to progress with time.     The patient will continue follow up with noninvasive studies as ordered.  The patient follow-up in 6 months or sooner if issues arise.           Current Outpatient Medications on File Prior to Visit  Medication Sig Dispense Refill   acetaminophen (TYLENOL) 325 MG tablet Take 1-2 tablets (325-650 mg total) by mouth every 4 (four) hours as needed for mild pain.       amLODipine (NORVASC) 10 MG tablet Take 1 tablet (10 mg total) by mouth daily. 30 tablet 0   apixaban (ELIQUIS) 5 MG TABS tablet Take 1 tablet (5 mg total) by mouth 2 (two) times daily. 60 tablet 1   gabapentin (NEURONTIN) 600 MG tablet Take 1 tablet (600 mg total) by mouth 3 (three) times daily. 90 tablet 0   methocarbamol (ROBAXIN) 500 MG tablet Take 1 tablet (500 mg total) by mouth every 6 (six) hours as needed for muscle spasms. 60 tablet 0   mupirocin ointment (BACTROBAN) 2 % Apply 1 Application topically daily. 30 g 2   nicotine (NICODERM CQ -  DOSED IN MG/24 HOURS) 21 mg/24hr patch Place 1 patch (21 mg total) onto the skin daily. 28 patch 0   oxyCODONE (OXY IR/ROXICODONE) 5 MG immediate release tablet Take 1 tablet (5 mg total) by mouth every 6 (six) hours as needed for severe pain. 28 tablet 0   potassium chloride SA (KLOR-CON M) 20 MEQ tablet Take 1 tablet (20 mEq total) by mouth daily. 30 tablet 0   traZODone (DESYREL) 50 MG tablet Take 0.5-1 tablets (25-50 mg total) by mouth at bedtime as needed for sleep. 10 tablet 0   Ferrous Sulfate (IRON) 325 (65 Fe) MG TABS Take 1 tablet (325 mg total) by mouth daily. 60 tablet 0   hydrochlorothiazide (HYDRODIURIL) 25 MG tablet Take 1 tablet (25 mg total) by mouth daily. 30 tablet 0    No current facility-administered medications on file prior to visit.      There are no Patient Instructions on file for this visit. No follow-ups on file.     Georgiana Spinner, NP PAIN:  Are you having pain? No  PRECAUTIONS: Fall  WEIGHT BEARING RESTRICTIONS: No  FALLS:  Has patient  fallen in last 6 months? 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  Pt. Has applied for disability (pending).    PLOF: Independent  PATIENT GOALS: Return to walking with less assistance  NEXT MD VISIT: PRN  OBJECTIVE:   PATIENT SURVEYS:  FOTO initial 46/ goal 16  COGNITION: Overall cognitive status: Within functional limits for tasks assessed     SENSATION: WFL  EDEMA:  Circumferential: 2" superior to template: R 47 cm/ L 51.5 cm.  Recommended shrinker use  MUSCLE LENGTH: Hamstrings: Right WNL; Left WNL  POSTURE: rounded shoulders, esp. When ambulating with RW  PALPATION: No tenderness/ good incision healing.    LOWER EXTREMITY ROM:  Good L LE AROM (all planes).  Prone L hip extension 25 deg.  B UE AROM  WNL  LOWER EXTREMITY MMT:  B LE strength grossly 5/5 MMT except L hip adduction/ extension 4/5 MMT.  B UE strength 5/5 MMT.   FUNCTIONAL TESTS:  5 times sit to stand: TBD 6 minute walk test: TBD  GAIT: Distance walked: In clinic/ //-bars Assistive device utilized: Walker - 2 wheeled Level of assistance: CGA Comments: pt. Ambulates in //-bars with cuing to increase a more consistent BOS/ recip. Step pattern with L heel strike to enforce L knee control/ wt. Bearing.  Pt. Ambulates at home without prosthetic leg and use of RW household distances.     TODAY'S TREATMENT:                                                                                                                              DATE: 10/21/2022  Subjective:  Pt. States she has had a difficult time with living situation/ moving a couple times since last PT appt.  Pt. Currently living with grandma's house with daughter/ granddaughter.  Pts. Currently residence has 2 steps to enter and using RW due to no handrails.  Pt. Unable to drive at this time.  Pt. Reports 7+ falls over past month due to L knee mechanism buckling when walking fast/ not focused on wt. Bearing.  Pt. arrived to PT with prosthetic donned and no complaints of pain or discomfort.  Pt. Stated she is happy with the way her prosthetic is currently fitting.  Pt. Is able to complete all of her personal hygiene tasks independently with use of RW 50% of the time and without any UE assistance 50% of the time.  Pt. Became labile at start of PT tx. Session when describing current situation/ not being able to work.  Pt. Taking sleeping meds per MD to improve rest at night.  Pt. Returns to [employer] on 4/24.    There Ex Warm up:  No charge NuStep L4 10 min. B UE/LE. Seat 9, arms 8. No reports of pain.  Reassessment of goals (see below)- multiple attempts to stand without UE assist.   FOTO: 49 (goal of 57).    Gait training:  Ambulate in hallway with no assistive device with  gait belt donned for safety x5 full laps. Pt. had some instances of prosthetic knee mechanism buckling requiring PT correction via gait belt.  Pt. Has increase episodes of knee buckling when walking faster with less UE assist.   Stair training - 1x5(up/down) with B. UE support & 1x5(up/down) with Unilateral UE support for community ambulation.   Sit to stands with 1 UE assist and progressing to no UE assist.    Reviewed HEP  PATIENT EDUCATION:  Education details: Access Code: [email or web address] Person educated: Patient Education method: Explanation, Demonstration, and Handouts Education comprehension: verbalized understanding and returned demonstration  HOME EXERCISE PROGRAM: Access Code: [email or web address] URL: [email or web address] Date: 07/20/2022 Prepared by: Dorene GrebeMichael Kindle Strohmeier  Exercises - Side Stepping with Counter Support  - 1 x daily - 7 x weekly - 2 sets - 10 reps - Standing Hip Extension with Counter Support  - 1 x daily - 7 x weekly - 2 sets - 10 reps - Supine Single Leg Bridge with Sound Leg (AKA)  - 1 x daily - 7 x weekly - 2 sets - 10 reps - Prone Hip Extension with Residual Limb (AKA)  - 1 x daily - 7 x weekly - 2 sets - 10 reps - Sidelying Hip Abduction with Flexion and Extension (AKA)  - 1 x daily - 7 x weekly - 2 sets - 10 reps  ASSESSMENT:  CLINICAL IMPRESSION: Pt. Has had a difficult time returning to PT treatment sessions over past month.  Pt. Has fallen numerous times over past month with R knee bruise/ soreness reported.  Pt. Was able to correct herself and avoid buckling about 50% of the time without PT assistance. Pt. Was cued to have equal B stride length while assuring to lock out the prosthetic limb to avoid buckling. Pt. Cued to maintain equal weight distribution between UE/LE to avoid compensation of hips or back. Pt. Reports no pain and hoping to return to more independent ambulation. Pt. Is continuing to advance her gait at home and is using her SPC more than her  RW.  Pt. Demonstrates good understanding/ technique with step gait pattern with SPC and gait belt required for safety. Pt. Will benefit from skilled PT services to increase LE strength to improve independence and safety with ambulation.    OBJECTIVE IMPAIRMENTS: Abnormal gait, decreased activity tolerance, decreased balance, decreased endurance, decreased mobility, difficulty walking, decreased ROM, decreased strength, improper body mechanics, postural dysfunction, and pain.   ACTIVITY LIMITATIONS: carrying, lifting, bending, squatting, stairs, transfers, toileting, dressing, and locomotion level  PARTICIPATION LIMITATIONS: cleaning, laundry, driving, shopping, and community activity  PERSONAL FACTORS: Age, Education, and Past/current experiences are also affecting patient's functional outcome.   REHAB POTENTIAL: Good  CLINICAL DECISION MAKING: Evolving/moderate complexity  EVALUATION COMPLEXITY: Moderate   GOALS: Goals reviewed with patient? Yes  LONG TERM GOALS: Target date: 12/16/22  Pt. Will increase FOTO to 57 to improve functional mobility.   Baseline:  initial 46.  4/11: 49  Goal status: Not met  2.  Pt. Able to ambulate community distances with use of prosthetic leg/ mod. I with least assistive device to improve functional mobility.   Baseline:  CGA for short distances in //-bars.  4/11: amb. With mod. I and use of RW but numerous falls.  Goal status: Partially met  3.  Pt. Able to ascend/ descend 2 flights of stairs with mod. I and use of single handrail safely.   Baseline: TBD.  4/11: pt. Requires use of B UE assist and cuing to slow down.   Goal status: Partially met  4.  Pt. Will report no falls with household ambulation consistently for a month to improve safety/ mod. Independence at home.    Baseline: 7 falls in past month  Goal status: initial   PLAN:  PT FREQUENCY: Biweekly  PT DURATION: 8 weeks  PLANNED INTERVENTIONS: Therapeutic exercises, Therapeutic  activity, Neuromuscular re-education, Balance training, Gait training, Patient/Family education, Self Care, Joint mobilization, Stair training, Prosthetic training, scar mobilization, Manual therapy, and Re-evaluation  PLAN FOR NEXT SESSION: Progress gait and stair training without assistive device, work on more dynamic tasks                Cammie Mcgee, PT, DPT # [phone] 10/21/2022, 6:21 PM

## 2022-11-11 ENCOUNTER — Ambulatory Visit: Payer: 59 | Attending: Nurse Practitioner | Admitting: Physical Therapy

## 2022-11-11 DIAGNOSIS — R269 Unspecified abnormalities of gait and mobility: Secondary | ICD-10-CM | POA: Diagnosis present

## 2022-11-11 DIAGNOSIS — M6281 Muscle weakness (generalized): Secondary | ICD-10-CM

## 2022-11-11 DIAGNOSIS — Z89612 Acquired absence of left leg above knee: Secondary | ICD-10-CM

## 2022-11-11 NOTE — Therapy (Signed)
OUTPATIENT PHYSICAL THERAPY LOWER EXTREMITY TREATMENT   Patient Name: Jocelyn [name] MRN: [id number] DOB:[birth year], 42 y.o., female Today's Date: 11/11/2022  END OF SESSION:  PT End of Session - 11/11/22 1510     Visit Number 9    Number of Visits 12    Date for PT Re-Evaluation 12/16/22    PT Start Time 1510    Equipment Utilized During Treatment Gait belt    Activity Tolerance Patient tolerated treatment well    Behavior During Therapy [facility] for tasks assessed/performed            1510 to 1601 (51 minutes).    Past Medical History:  Diagnosis Date   IDA (iron deficiency anemia) 12/17/2019   Past Surgical History:  Procedure Laterality Date   AMPUTATION Left 01/20/2022   Procedure: AMPUTATION ABOVE KNEE;  Surgeon: Annice Needy, MD;  Location: [facility] ORS;  Service: Vascular;  Laterality: Left;   APPENDECTOMY     LOWER EXTREMITY ANGIOGRAPHY Left 01/11/2020   Procedure: Lower Extremity Angiography;  Surgeon: Annice Needy, MD;  Location: [facility];  Service: Cardiovascular;  Laterality: Left;   LOWER EXTREMITY ANGIOGRAPHY Left 03/06/2020   Procedure: Lower Extremity Angiography;  Surgeon: Annice Needy, MD;  Location: [facility];  Service: Cardiovascular;  Laterality: Left;   LOWER EXTREMITY ANGIOGRAPHY Left 03/07/2020   Procedure: Lower Extremity Angiography;  Surgeon: Annice Needy, MD;  Location: [facility];  Service: Cardiovascular;  Laterality: Left;   LOWER EXTREMITY ANGIOGRAPHY Left 01/06/2022   Procedure: Lower Extremity Angiography;  Surgeon: Annice Needy, MD;  Location: [facility];  Service: Cardiovascular;  Laterality: Left;   LOWER EXTREMITY ANGIOGRAPHY Left 01/07/2022   Procedure: Lower Extremity Angiography;  Surgeon: Annice Needy, MD;  Location: [facility];  Service: Cardiovascular;  Laterality: Left;   LOWER EXTREMITY ANGIOGRAPHY Left 01/18/2022   Procedure: Lower Extremity Angiography;  Surgeon: Annice Needy, MD;   Location: [facility];  Service: Cardiovascular;  Laterality: Left;   LOWER EXTREMITY INTERVENTION Bilateral 01/17/2022   Procedure: LOWER EXTREMITY INTERVENTION;  Surgeon: Learta Codding, MD;  Location: [facility];  Service: Cardiovascular;  Laterality: Bilateral;   PERIPHERAL VASCULAR THROMBECTOMY Left 01/20/2022   Procedure: PERIPHERAL VASCULAR THROMBECTOMY Left Femoral vein;  Surgeon: Annice Needy, MD;  Location: [facility];  Service: Vascular;  Laterality: Left;   Patient Active Problem List   Diagnosis Date Noted   S/P AKA (above knee amputation) unilateral, left (HCC) 01/25/2022   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   Acute urinary retention 01/19/2022   Fever 01/18/2022   Acute deep vein thrombosis (DVT) of left femoral vein (HCC) 01/17/2022   Hypomagnesemia 01/06/2022   Hyponatremia 01/06/2022   Critical limb ischemia of left lower extremity (HCC) 01/10/2020   Diabetes mellitus without complication (HCC) 12/17/2019   IDA (iron deficiency anemia) 12/17/2019   HTN (hypertension) 12/17/2019    PCP: [facility]  REFERRING PROVIDER: Georgiana Spinner, NP  REFERRING DIAG: S/P AKA (above knee amputation) unilateral, left (HCC)  THERAPY DIAG:  S/P AKA (above knee amputation) unilateral, left (HCC)  Muscle weakness (generalized)  Gait difficulty  Rationale for Evaluation and Treatment: Rehabilitation  ONSET DATE: 01/2023  SUBJECTIVE:   SUBJECTIVE STATEMENT: Pt. Reports having increase blood clots after receiving Covid vaccine 3 years ago. Pt. Had multiple surgeries resulting in L AKA on 01/20/22.  Pt. Received prosthetic leg 1 month ago from [facility].  Pt. Was delayed with receiving leg secondary to insurance issues.  Pt. Has not walked with prosthetic leg at this time.  No c/o pain but phantom limb symptoms.  Pt. Has not been compliant with donning shrinker or HEP.    PERTINENT HISTORY: 1. S/P AKA (above knee amputation) unilateral, left  (HCC) The patient will continue with physical therapy.  Patient will follow-up if there are issues with her residual limb. 2. Primary hypertension Continue antihypertensive medications as already ordered, these medications have been reviewed and there are no changes at this time.    3. Peripheral arterial disease with history of revascularization (HCC)  Recommend:   The patient has evidence of atherosclerosis of the lower extremities with claudication.  The patient does not voice lifestyle limiting changes at this point in time.   Noninvasive studies do not suggest clinically significant change.   No invasive studies, angiography or surgery at this time The patient should continue walking and begin a more formal exercise program.  The patient should continue antiplatelet therapy and aggressive treatment of the lipid abnormalities   No changes in the patient's medications at this time   Continued surveillance is indicated as atherosclerosis is likely to progress with time.     The patient will continue follow up with noninvasive studies as ordered.  The patient follow-up in 6 months or sooner if issues arise.           Current Outpatient Medications on File Prior to Visit  Medication Sig Dispense Refill   acetaminophen (TYLENOL) 325 MG tablet Take 1-2 tablets (325-650 mg total) by mouth every 4 (four) hours as needed for mild pain.       amLODipine (NORVASC) 10 MG tablet Take 1 tablet (10 mg total) by mouth daily. 30 tablet 0   apixaban (ELIQUIS) 5 MG TABS tablet Take 1 tablet (5 mg total) by mouth 2 (two) times daily. 60 tablet 1   gabapentin (NEURONTIN) 600 MG tablet Take 1 tablet (600 mg total) by mouth 3 (three) times daily. 90 tablet 0   methocarbamol (ROBAXIN) 500 MG tablet Take 1 tablet (500 mg total) by mouth every 6 (six) hours as needed for muscle spasms. 60 tablet 0   mupirocin ointment (BACTROBAN) 2 % Apply 1 Application topically daily. 30 g 2   nicotine (NICODERM CQ -  DOSED IN MG/24 HOURS) 21 mg/24hr patch Place 1 patch (21 mg total) onto the skin daily. 28 patch 0   oxyCODONE (OXY IR/ROXICODONE) 5 MG immediate release tablet Take 1 tablet (5 mg total) by mouth every 6 (six) hours as needed for severe pain. 28 tablet 0   potassium chloride SA (KLOR-CON M) 20 MEQ tablet Take 1 tablet (20 mEq total) by mouth daily. 30 tablet 0   traZODone (DESYREL) 50 MG tablet Take 0.5-1 tablets (25-50 mg total) by mouth at bedtime as needed for sleep. 10 tablet 0   Ferrous Sulfate (IRON) 325 (65 Fe) MG TABS Take 1 tablet (325 mg total) by mouth daily. 60 tablet 0   hydrochlorothiazide (HYDRODIURIL) 25 MG tablet Take 1 tablet (25 mg total) by mouth daily. 30 tablet 0    No current facility-administered medications on file prior to visit.      There are no Patient Instructions on file for this visit. No follow-ups on file.     Georgiana Spinner, NP PAIN:  Are you having pain? No  PRECAUTIONS: Fall  WEIGHT BEARING RESTRICTIONS: No  FALLS:  Has patient fallen in last  6 months? 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  Pt. Has applied for disability (pending).    PLOF: Independent  PATIENT GOALS: Return to walking with less assistance  NEXT MD VISIT: PRN  OBJECTIVE:   PATIENT SURVEYS:  FOTO initial 46/ goal 84  COGNITION: Overall cognitive status: Within functional limits for tasks assessed     SENSATION: WFL  EDEMA:  Circumferential: 2" superior to template: R 47 cm/ L 51.5 cm.  Recommended shrinker use  MUSCLE LENGTH: Hamstrings: Right WNL; Left WNL  POSTURE: rounded shoulders, esp. When ambulating with RW  PALPATION: No tenderness/ good incision healing.    LOWER EXTREMITY ROM:  Good L LE AROM (all planes).  Prone L hip extension 25 deg.  B UE AROM  WNL  LOWER EXTREMITY MMT:  B LE strength grossly 5/5 MMT except L hip adduction/ extension 4/5 MMT.  B UE strength 5/5 MMT.   FUNCTIONAL TESTS:  5 times sit to stand: TBD 6 minute walk test: TBD  GAIT: Distance walked: In clinic/ //-bars Assistive device utilized: Walker - 2 wheeled Level of assistance: CGA Comments: pt. Ambulates in //-bars with cuing to increase a more consistent BOS/ recip. Step pattern with L heel strike to enforce L knee control/ wt. Bearing.  Pt. Ambulates at home without prosthetic leg and use of RW household distances.    Pt. States she has had a difficult time with living situation/ moving a couple times since last PT appt.  Pt. Currently living with grandma's house with daughter/ granddaughter.  Pts. Currently residence has 2 steps to enter and using RW due to no handrails.  Pt. Unable to drive at this time.  Pt. Reports 7+ falls over past month due to L knee mechanism buckling when walking fast/ not focused on wt. Bearing.  Pt. arrived to PT with prosthetic donned and no complaints of pain or discomfort.  Pt. Stated she is happy with the way her prosthetic is currently fitting.  Pt. Is able to complete all of her personal hygiene tasks independently with use of RW 50% of the time and without any UE assistance 50% of the time.  Pt. Became labile at start of PT tx. Session when describing current situation/ not being able to work.  Pt. Taking sleeping meds per MD to improve rest at night.  Pt. Returns to [employer] on 4/24.    TODAY'S TREATMENT:                                                                                                                              DATE: 11/11/2022  Subjective:  Pt. Reports 1 fall since last PT tx. Session.  Pt. States she was putting RW in trunk of car at [employer] and when she  started to walk to passenger seat her knee buckled.  Pt. States the knee did not click and resulted in fall.  No injury reported.  Pt. Was able to  return to standing independently.  Pt. Reports minimal phantom limbs symptoms recently.      There Ex Warm up:  No charge NuStep L4 10 min. B UE/LE. Seat 9, arms 8. No reports of pain.   Gait training:  Ambulate in hallway with use of SPC with gait belt donned for safety x 3 full laps.  Pt. Demonstrates consistent 2-point gait pattern with good L knee mechanism control/ heel strike.  Pt. Has increase episodes/ fear of knee buckling when walking faster with less UE assist.   Walking in //-bars with R LE step ups/ L LE step downs with B UE assist and progress to no UE assist/ CGA for safety.    Stair training - 1x5(up/down) with B. UE support & 1x5(up/down) with Unilateral UE support for community ambulation.   Neuro. Mm.:  Sit to stands with 20 feet walking without assistive device/ turning and returning to chair 2x.     Turning CW/CCW in //-bars with light to no UE assist.    Lateral walking in //-bars with light UE assist progressing to no UE assist to L/R.  Mirror feedback for posture correction.    Reviewed HEP  PATIENT EDUCATION:  Education details: Access Code: [email or web address] Person educated: Patient Education method: Explanation, Demonstration, and Handouts Education comprehension: verbalized understanding and returned demonstration  HOME EXERCISE PROGRAM: Access Code: [email or web address] URL: [email or web address] Date: 07/20/2022 Prepared by: Dorene Grebe  Exercises - Side Stepping with Counter Support  - 1 x daily - 7 x weekly - 2 sets - 10 reps - Standing Hip Extension with Counter Support  - 1 x daily - 7 x weekly - 2 sets - 10 reps - Supine Single Leg Bridge with Sound Leg (AKA)  - 1 x daily - 7 x weekly - 2 sets - 10 reps - Prone Hip Extension with Residual Limb (AKA)  - 1 x daily - 7 x weekly - 2 sets - 10 reps - Sidelying Hip Abduction with Flexion and Extension (AKA)  - 1 x daily - 7 x weekly - 2 sets - 10 reps  ASSESSMENT:  CLINICAL IMPRESSION: Pt. Reports  no pain and hoping to return to more independent ambulation. Pt. Is continuing to advance her gait at home and is using her SPC more than her RW.  Pt. Challenged with step ups/ overs in //-bars with 1 UE assist and control of prosthetic leg.  Pt. Demonstrates good understanding/ technique with step gait pattern with SPC and gait belt required for safety. No LOB during tx. But pt. More hesitant during SPC/ no assistive device use.  Pt. Will benefit from skilled PT services to increase LE strength to improve independence and safety with ambulation.    OBJECTIVE IMPAIRMENTS: Abnormal gait, decreased activity tolerance, decreased balance, decreased endurance, decreased mobility, difficulty walking, decreased ROM, decreased strength, improper body mechanics, postural dysfunction, and pain.   ACTIVITY LIMITATIONS: carrying, lifting, bending, squatting, stairs, transfers, toileting, dressing, and locomotion level  PARTICIPATION LIMITATIONS: cleaning, laundry, driving, shopping, and community activity  PERSONAL FACTORS: Age, Education, and Past/current experiences are also affecting patient's functional outcome.   REHAB POTENTIAL: Good  CLINICAL DECISION MAKING: Evolving/moderate complexity  EVALUATION COMPLEXITY: Moderate   GOALS: Goals reviewed with patient? Yes  LONG TERM GOALS: Target date: 12/16/22  Pt. Will increase FOTO to 57 to  improve functional mobility.   Baseline:  initial 46.  4/11: 49  Goal status: Not met  2.  Pt. Able to ambulate community distances with use of prosthetic leg/ mod. I with least assistive device to improve functional mobility.   Baseline:  CGA for short distances in //-bars.  4/11: amb. With mod. I and use of RW but numerous falls.  Goal status: Partially met  3.  Pt. Able to ascend/ descend 2 flights of stairs with mod. I and use of single handrail safely.   Baseline: TBD.  4/11: pt. Requires use of B UE assist and cuing to slow down.   Goal status: Partially  met  4.  Pt. Will report no falls with household ambulation consistently for a month to improve safety/ mod. Independence at home.    Baseline: 7 falls in past month  Goal status: initial   PLAN:  PT FREQUENCY: Biweekly  PT DURATION: 8 weeks  PLANNED INTERVENTIONS: Therapeutic exercises, Therapeutic activity, Neuromuscular re-education, Balance training, Gait training, Patient/Family education, Self Care, Joint mobilization, Stair training, Prosthetic training, scar mobilization, Manual therapy, and Re-evaluation  PLAN FOR NEXT SESSION: Progress gait and stair training without assistive device, work on more dynamic tasks.  Outside walking.                Cammie Mcgee, PT, DPT # [phone] 11/11/2022, 3:11 PM

## 2022-11-25 ENCOUNTER — Ambulatory Visit: Payer: 59 | Admitting: Physical Therapy

## 2022-11-25 ENCOUNTER — Encounter: Payer: Self-pay | Admitting: Physical Therapy

## 2022-11-25 DIAGNOSIS — M6281 Muscle weakness (generalized): Secondary | ICD-10-CM

## 2022-11-25 DIAGNOSIS — Z89612 Acquired absence of left leg above knee: Secondary | ICD-10-CM

## 2022-11-25 DIAGNOSIS — R269 Unspecified abnormalities of gait and mobility: Secondary | ICD-10-CM

## 2022-11-25 NOTE — Therapy (Signed)
OUTPATIENT PHYSICAL THERAPY LOWER EXTREMITY TREATMENT Physical Therapy Progress Note   Dates of reporting period  07/20/22   to   11/25/22    Patient Name: ELLORY REARDEN MRN: [id number] DOB:Aug 16, 1980, 42 y.o., female Today's Date: 11/26/2022  END OF SESSION:  PT End of Session - 11/25/22 1518     Visit Number 10    Number of Visits 12    Date for PT Re-Evaluation 12/16/22    PT Start Time 1518    PT Stop Time 1605    PT Time Calculation (min) 47 min    Equipment Utilized During Treatment Gait belt    Activity Tolerance Patient tolerated treatment well    Behavior During Therapy WFL for tasks assessed/performed             Past Medical History:  Diagnosis Date   IDA (iron deficiency anemia) 12/17/2019   Past Surgical History:  Procedure Laterality Date   AMPUTATION Left 01/20/2022   Procedure: AMPUTATION ABOVE KNEE;  Surgeon: Annice Needy, MD;  Location: [facility];  Service: Vascular;  Laterality: Left;   APPENDECTOMY     LOWER EXTREMITY ANGIOGRAPHY Left 01/11/2020   Procedure: Lower Extremity Angiography;  Surgeon: Annice Needy, MD;  Location: [facility];  Service: Cardiovascular;  Laterality: Left;   LOWER EXTREMITY ANGIOGRAPHY Left 03/06/2020   Procedure: Lower Extremity Angiography;  Surgeon: Annice Needy, MD;  Location: [facility];  Service: Cardiovascular;  Laterality: Left;   LOWER EXTREMITY ANGIOGRAPHY Left 03/07/2020   Procedure: Lower Extremity Angiography;  Surgeon: Annice Needy, MD;  Location: [facility];  Service: Cardiovascular;  Laterality: Left;   LOWER EXTREMITY ANGIOGRAPHY Left 01/06/2022   Procedure: Lower Extremity Angiography;  Surgeon: Annice Needy, MD;  Location: [facility];  Service: Cardiovascular;  Laterality: Left;   LOWER EXTREMITY ANGIOGRAPHY Left 01/07/2022   Procedure: Lower Extremity Angiography;  Surgeon: Annice Needy, MD;  Location: [facility];  Service: Cardiovascular;  Laterality: Left;   LOWER  EXTREMITY ANGIOGRAPHY Left 01/18/2022   Procedure: Lower Extremity Angiography;  Surgeon: Annice Needy, MD;  Location: [facility];  Service: Cardiovascular;  Laterality: Left;   LOWER EXTREMITY INTERVENTION Bilateral 01/17/2022   Procedure: LOWER EXTREMITY INTERVENTION;  Surgeon: Learta Codding, MD;  Location: [facility];  Service: Cardiovascular;  Laterality: Bilateral;   PERIPHERAL VASCULAR THROMBECTOMY Left 01/20/2022   Procedure: PERIPHERAL VASCULAR THROMBECTOMY Left Femoral vein;  Surgeon: Annice Needy, MD;  Location: [facility];  Service: Vascular;  Laterality: Left;   Patient Active Problem List   Diagnosis Date Noted   S/P AKA (above knee amputation) unilateral, left (HCC) 01/25/2022   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   Acute urinary retention 01/19/2022   Fever 01/18/2022   Acute deep vein thrombosis (DVT) of left femoral vein (HCC) 01/17/2022   Hypomagnesemia 01/06/2022   Hyponatremia 01/06/2022   Critical limb ischemia of left lower extremity (HCC) 01/10/2020   Diabetes mellitus without complication (HCC) 12/17/2019   IDA (iron deficiency anemia) 12/17/2019   HTN (hypertension) 12/17/2019    PCP: [facility]  REFERRING PROVIDER: Georgiana Spinner, NP  REFERRING DIAG: S/P AKA (above knee amputation) unilateral, left (HCC)  THERAPY DIAG:  S/P AKA (above knee amputation) unilateral, left (HCC)  Muscle weakness (generalized)  Gait difficulty  Rationale for Evaluation and Treatment: Rehabilitation  ONSET DATE: 01/2023  SUBJECTIVE:   SUBJECTIVE STATEMENT: Pt. Reports having increase blood clots after  receiving Covid vaccine 3 years ago. Pt. Had multiple surgeries resulting in L AKA on 01/20/22.  Pt. Received prosthetic leg 1 month ago from [facility].  Pt. Was delayed with receiving leg secondary to insurance issues.  Pt. Has not walked with prosthetic leg at this time.  No c/o pain but phantom limb symptoms.  Pt. Has not been compliant  with donning shrinker or HEP.    PERTINENT HISTORY: 1. S/P AKA (above knee amputation) unilateral, left (HCC) The patient will continue with physical therapy.  Patient will follow-up if there are issues with her residual limb. 2. Primary hypertension Continue antihypertensive medications as already ordered, these medications have been reviewed and there are no changes at this time.    3. Peripheral arterial disease with history of revascularization (HCC)  Recommend:   The patient has evidence of atherosclerosis of the lower extremities with claudication.  The patient does not voice lifestyle limiting changes at this point in time.   Noninvasive studies do not suggest clinically significant change.   No invasive studies, angiography or surgery at this time The patient should continue walking and begin a more formal exercise program.  The patient should continue antiplatelet therapy and aggressive treatment of the lipid abnormalities   No changes in the patient's medications at this time   Continued surveillance is indicated as atherosclerosis is likely to progress with time.     The patient will continue follow up with noninvasive studies as ordered.  The patient follow-up in 6 months or sooner if issues arise.           Current Outpatient Medications on File Prior to Visit  Medication Sig Dispense Refill   acetaminophen (TYLENOL) 325 MG tablet Take 1-2 tablets (325-650 mg total) by mouth every 4 (four) hours as needed for mild pain.       amLODipine (NORVASC) 10 MG tablet Take 1 tablet (10 mg total) by mouth daily. 30 tablet 0   apixaban (ELIQUIS) 5 MG TABS tablet Take 1 tablet (5 mg total) by mouth 2 (two) times daily. 60 tablet 1   gabapentin (NEURONTIN) 600 MG tablet Take 1 tablet (600 mg total) by mouth 3 (three) times daily. 90 tablet 0   methocarbamol (ROBAXIN) 500 MG tablet Take 1 tablet (500 mg total) by mouth every 6 (six) hours as needed for muscle spasms. 60 tablet 0    mupirocin ointment (BACTROBAN) 2 % Apply 1 Application topically daily. 30 g 2   nicotine (NICODERM CQ - DOSED IN MG/24 HOURS) 21 mg/24hr patch Place 1 patch (21 mg total) onto the skin daily. 28 patch 0   oxyCODONE (OXY IR/ROXICODONE) 5 MG immediate release tablet Take 1 tablet (5 mg total) by mouth every 6 (six) hours as needed for severe pain. 28 tablet 0   potassium chloride SA (KLOR-CON M) 20 MEQ tablet Take 1 tablet (20 mEq total) by mouth daily. 30 tablet 0   traZODone (DESYREL) 50 MG tablet Take 0.5-1 tablets (25-50 mg total) by mouth at bedtime as needed for sleep. 10 tablet 0   Ferrous Sulfate (IRON) 325 (65 Fe) MG TABS Take 1 tablet (325 mg total) by mouth daily. 60 tablet 0   hydrochlorothiazide (HYDRODIURIL) 25 MG tablet Take 1 tablet (25 mg total) by mouth daily. 30 tablet 0    No current facility-administered medications on file prior to visit.      There are no Patient Instructions on file for this visit. No follow-ups on file.  Georgiana Spinner, NP PAIN:  Are you having pain? No  PRECAUTIONS: Fall  WEIGHT BEARING RESTRICTIONS: No  FALLS:  Has patient fallen in last 6 months? 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  Pt. Has applied for disability (pending).    PLOF: Independent  PATIENT GOALS: Return to walking with less assistance  NEXT MD VISIT: PRN  OBJECTIVE:   PATIENT SURVEYS:  FOTO initial 46/ goal 62  COGNITION: Overall cognitive status: Within functional limits for tasks assessed     SENSATION: WFL  EDEMA:  Circumferential: 2" superior to template: R 47 cm/ L 51.5 cm.  Recommended shrinker use  MUSCLE LENGTH: Hamstrings: Right WNL; Left WNL  POSTURE: rounded shoulders, esp. When ambulating with RW  PALPATION: No tenderness/ good incision healing.     LOWER EXTREMITY ROM:  Good L LE AROM (all planes).  Prone L hip extension 25 deg.  B UE AROM WNL  LOWER EXTREMITY MMT:  B LE strength grossly 5/5 MMT except L hip adduction/ extension 4/5 MMT.  B UE strength 5/5 MMT.   FUNCTIONAL TESTS:  5 times sit to stand: TBD 6 minute walk test: TBD  GAIT: Distance walked: In clinic/ //-bars Assistive device utilized: Walker - 2 wheeled Level of assistance: CGA Comments: pt. Ambulates in //-bars with cuing to increase a more consistent BOS/ recip. Step pattern with L heel strike to enforce L knee control/ wt. Bearing.  Pt. Ambulates at home without prosthetic leg and use of RW household distances.    Pt. States she has had a difficult time with living situation/ moving a couple times since last PT appt.  Pt. Currently living with grandma's house with daughter/ granddaughter.  Pts. Currently residence has 2 steps to enter and using RW due to no handrails.  Pt. Unable to drive at this time.  Pt. Reports 7+ falls over past month due to L knee mechanism buckling when walking fast/ not focused on wt. Bearing.  Pt. arrived to PT with prosthetic donned and no complaints of pain or discomfort.  Pt. Stated she is happy with the way her prosthetic is currently fitting.  Pt. Is able to complete all of her personal hygiene tasks independently with use of RW 50% of the time and without any UE assistance 50% of the time.  Pt. Became labile at start of PT tx. Session when describing current situation/ not being able to work.  Pt. Taking sleeping meds per MD to improve rest at night.  Pt. Returns to [employer] on 4/24.    TODAY'S TREATMENT:                                                                                                                              DATE: 11/26/2022  Subjective:  Pt. Reports no falls since last PT tx. Session.  Pt. Reports minimal phantom limb symptoms recently.  Pt. States phantom pain is a 3-4/10 at worst.  Pt. Has f/u with PCP next  week.  Pt. Has not heard from disability lawyer.     There Ex Warm up:  No charge  NuStep L4 10 min. B UE/LE. Seat 9, arms 8. No reports of pain.   Gait training:  Ambulate in hallway with use of SPC with gait belt donned for safety x 3 full laps.  Pt. Demonstrates consistent 2-point gait pattern with good L knee mechanism control/ heel strike.  Pt. Has increase episodes/ fear of knee buckling when walking faster with less UE assist.   Stair training - 1x5(up/down) with B. UE support & 1x5(up/down) with Unilateral UE support for community ambulation.   Amb. Outside on ramp/ curbs/ parking lot/ grass with use of SPC and SBA/CGA for safety and cuing.  Pt. Ambulates slow, controlled down ramp and over grass.    Neuro. Mm.:  Resisted gait 2BTB: forward/ backwards/ lateral with light UE assist for safety. 4x each direction.      Turning CW/CCW in //-bars with light to no UE assist.    Lateral walking in //-bars with light UE assist progressing to no UE assist to L/R.  Mirror feedback for posture correction.    Reviewed goals/ HEP  PATIENT EDUCATION:  Education details: Access Code: [email or web address] Person educated: Patient Education method: Explanation, Demonstration, and Handouts Education comprehension: verbalized understanding and returned demonstration  HOME EXERCISE PROGRAM: Access Code: [email or web address] URL: [email or web address] Date: 07/20/2022 Prepared by: Dorene Grebe  Exercises - Side Stepping with Counter Support  - 1 x daily - 7 x weekly - 2 sets - 10 reps - Standing Hip Extension with Counter Support  - 1 x daily - 7 x weekly - 2 sets - 10 reps - Supine Single Leg Bridge with Sound Leg (AKA)  - 1 x daily - 7 x weekly - 2 sets - 10 reps - Prone Hip Extension with Residual Limb (AKA)  - 1 x daily - 7 x weekly - 2 sets - 10 reps - Sidelying Hip Abduction with Flexion and Extension (AKA)  - 1 x daily - 7 x weekly - 2 sets - 10 reps  ASSESSMENT:  CLINICAL  IMPRESSION: Pt. Reports no pain currently with occasional increase in phantom limb symptoms.  Pt. Is continuing to advance her gait at home and is using her SPC more than her RW.  Pt. Challenged with resisted gait in //-bars and outside ambulation with use of SPC.  Pt. Planning to purchase her own [facility] for home/ outside use to wean off the RW.  Pt. Demonstrates good understanding/ technique with 2-point gait pattern with SPC and gait belt required for safety. No LOB during tx. But pt. More hesitant during SPC/ outside walking.  Pt. Will benefit from skilled PT services to increase LE strength to improve independence and safety with ambulation.    OBJECTIVE IMPAIRMENTS: Abnormal gait, decreased activity tolerance, decreased balance, decreased endurance, decreased mobility, difficulty walking, decreased ROM, decreased strength, improper body mechanics, postural dysfunction, and pain.   ACTIVITY LIMITATIONS: carrying, lifting, bending, squatting, stairs, transfers, toileting, dressing, and locomotion level  PARTICIPATION LIMITATIONS: cleaning, laundry, driving, shopping, and community activity  PERSONAL FACTORS: Age, Education, and Past/current experiences are also affecting patient's functional outcome.   REHAB POTENTIAL: Good  CLINICAL DECISION MAKING: Evolving/moderate complexity  EVALUATION COMPLEXITY: Moderate   GOALS: Goals reviewed  with patient? Yes  LONG TERM GOALS: Target date: 12/16/22  Pt. Will increase FOTO to 57 to improve functional mobility.   Baseline:  initial 46.  4/11: 49  Goal status: Not met  2.  Pt. Able to ambulate community distances with use of prosthetic leg/ mod. I with least assistive device to improve functional mobility.   Baseline:  CGA for short distances in //-bars.  4/11: amb. With mod. I and use of RW but numerous falls.  Goal status: Partially met  3.  Pt. Able to ascend/ descend 2 flights of stairs with mod. I and use of single handrail safely.    Baseline: TBD.  4/11: pt. Requires use of B UE assist and cuing to slow down.   Goal status: Partially met  4.  Pt. Will report no falls with household ambulation consistently for a month to improve safety/ mod. Independence at home.    Baseline: 7 falls in past month.  5/16: 1 fall since returning to PT  Goal status: On-going   PLAN:  PT FREQUENCY: Biweekly  PT DURATION: 8 weeks  PLANNED INTERVENTIONS: Therapeutic exercises, Therapeutic activity, Neuromuscular re-education, Balance training, Gait training, Patient/Family education, Self Care, Joint mobilization, Stair training, Prosthetic training, scar mobilization, Manual therapy, and Re-evaluation  PLAN FOR NEXT SESSION: Progress gait and stair training without assistive device, work on more dynamic tasks.  Outside walking.                Cammie Mcgee, PT, DPT # [phone] 11/26/2022, 7:45 PM

## 2022-11-29 DIAGNOSIS — R6889 Other general symptoms and signs: Secondary | ICD-10-CM | POA: Insufficient documentation

## 2022-12-09 ENCOUNTER — Ambulatory Visit: Payer: 59 | Admitting: Physical Therapy

## 2022-12-23 ENCOUNTER — Ambulatory Visit: Payer: Medicaid Other | Attending: Nurse Practitioner | Admitting: Physical Therapy

## 2022-12-23 ENCOUNTER — Encounter: Payer: Self-pay | Admitting: Physical Therapy

## 2022-12-23 DIAGNOSIS — M6281 Muscle weakness (generalized): Secondary | ICD-10-CM | POA: Insufficient documentation

## 2022-12-23 DIAGNOSIS — Z89612 Acquired absence of left leg above knee: Secondary | ICD-10-CM | POA: Diagnosis present

## 2022-12-23 DIAGNOSIS — R269 Unspecified abnormalities of gait and mobility: Secondary | ICD-10-CM | POA: Insufficient documentation

## 2022-12-23 NOTE — Therapy (Signed)
OUTPATIENT PHYSICAL THERAPY LOWER EXTREMITY TREATMENT/ RECERTIFICATION  Patient Name: ALDER TUBB MRN: [id number] DOB:Sep 16, 1980, 42 y.o., female Today's Date: 12/23/2022  END OF SESSION:  PT End of Session - 12/23/22 1510     Visit Number 11    Number of Visits 15    Date for PT Re-Evaluation 02/17/23    PT Start Time 1510    PT Stop Time 1603    PT Time Calculation (min) 53 min    Equipment Utilized During Treatment Gait belt    Activity Tolerance Patient tolerated treatment well    Behavior During Therapy WFL for tasks assessed/performed             Past Medical History:  Diagnosis Date   IDA (iron deficiency anemia) 12/17/2019   Past Surgical History:  Procedure Laterality Date   AMPUTATION Left 01/20/2022   Procedure: AMPUTATION ABOVE KNEE;  Surgeon: Annice Needy, MD;  Location: [facility];  Service: Vascular;  Laterality: Left;   APPENDECTOMY     LOWER EXTREMITY ANGIOGRAPHY Left 01/11/2020   Procedure: Lower Extremity Angiography;  Surgeon: Annice Needy, MD;  Location: [facility];  Service: Cardiovascular;  Laterality: Left;   LOWER EXTREMITY ANGIOGRAPHY Left 03/06/2020   Procedure: Lower Extremity Angiography;  Surgeon: Annice Needy, MD;  Location: [facility];  Service: Cardiovascular;  Laterality: Left;   LOWER EXTREMITY ANGIOGRAPHY Left 03/07/2020   Procedure: Lower Extremity Angiography;  Surgeon: Annice Needy, MD;  Location: [facility];  Service: Cardiovascular;  Laterality: Left;   LOWER EXTREMITY ANGIOGRAPHY Left 01/06/2022   Procedure: Lower Extremity Angiography;  Surgeon: Annice Needy, MD;  Location: [facility];  Service: Cardiovascular;  Laterality: Left;   LOWER EXTREMITY ANGIOGRAPHY Left 01/07/2022   Procedure: Lower Extremity Angiography;  Surgeon: Annice Needy, MD;  Location: [facility];  Service: Cardiovascular;  Laterality: Left;   LOWER EXTREMITY ANGIOGRAPHY Left 01/18/2022   Procedure: Lower Extremity  Angiography;  Surgeon: Annice Needy, MD;  Location: [facility];  Service: Cardiovascular;  Laterality: Left;   LOWER EXTREMITY INTERVENTION Bilateral 01/17/2022   Procedure: LOWER EXTREMITY INTERVENTION;  Surgeon: Learta Codding, MD;  Location: [facility];  Service: Cardiovascular;  Laterality: Bilateral;   PERIPHERAL VASCULAR THROMBECTOMY Left 01/20/2022   Procedure: PERIPHERAL VASCULAR THROMBECTOMY Left Femoral vein;  Surgeon: Annice Needy, MD;  Location: [facility];  Service: Vascular;  Laterality: Left;   Patient Active Problem List   Diagnosis Date Noted   S/P AKA (above knee amputation) unilateral, left (HCC) 01/25/2022   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   Acute urinary retention 01/19/2022   Fever 01/18/2022   Acute deep vein thrombosis (DVT) of left femoral vein (HCC) 01/17/2022   Hypomagnesemia 01/06/2022   Hyponatremia 01/06/2022   Critical limb ischemia of left lower extremity (HCC) 01/10/2020   Diabetes mellitus without complication (HCC) 12/17/2019   IDA (iron deficiency anemia) 12/17/2019   HTN (hypertension) 12/17/2019    PCP: [facility]  REFERRING PROVIDER: Georgiana Spinner, NP  REFERRING DIAG: S/P AKA (above knee amputation) unilateral, left (HCC)  THERAPY DIAG:  S/P AKA (above knee amputation) unilateral, left (HCC)  Muscle weakness (generalized)  Gait difficulty  Rationale for Evaluation and Treatment: Rehabilitation  ONSET DATE: 01/2023  SUBJECTIVE:   SUBJECTIVE STATEMENT: Pt. Reports having increase blood clots after receiving Covid vaccine 3 years ago. Pt. Had multiple surgeries resulting in L AKA on 01/20/22.  Pt. Received  prosthetic leg 1 month ago from [facility].  Pt. Was delayed with receiving leg secondary to insurance issues.  Pt. Has not walked with prosthetic leg at this time.  No c/o pain but phantom limb symptoms.  Pt. Has not been compliant with donning shrinker or HEP.    PERTINENT HISTORY: 1. S/P AKA  (above knee amputation) unilateral, left (HCC) The patient will continue with physical therapy.  Patient will follow-up if there are issues with her residual limb. 2. Primary hypertension Continue antihypertensive medications as already ordered, these medications have been reviewed and there are no changes at this time.    3. Peripheral arterial disease with history of revascularization (HCC)  Recommend:   The patient has evidence of atherosclerosis of the lower extremities with claudication.  The patient does not voice lifestyle limiting changes at this point in time.   Noninvasive studies do not suggest clinically significant change.   No invasive studies, angiography or surgery at this time The patient should continue walking and begin a more formal exercise program.  The patient should continue antiplatelet therapy and aggressive treatment of the lipid abnormalities   No changes in the patient's medications at this time   Continued surveillance is indicated as atherosclerosis is likely to progress with time.     The patient will continue follow up with noninvasive studies as ordered.  The patient follow-up in 6 months or sooner if issues arise.           Current Outpatient Medications on File Prior to Visit  Medication Sig Dispense Refill   acetaminophen (TYLENOL) 325 MG tablet Take 1-2 tablets (325-650 mg total) by mouth every 4 (four) hours as needed for mild pain.       amLODipine (NORVASC) 10 MG tablet Take 1 tablet (10 mg total) by mouth daily. 30 tablet 0   apixaban (ELIQUIS) 5 MG TABS tablet Take 1 tablet (5 mg total) by mouth 2 (two) times daily. 60 tablet 1   gabapentin (NEURONTIN) 600 MG tablet Take 1 tablet (600 mg total) by mouth 3 (three) times daily. 90 tablet 0   methocarbamol (ROBAXIN) 500 MG tablet Take 1 tablet (500 mg total) by mouth every 6 (six) hours as needed for muscle spasms. 60 tablet 0   mupirocin ointment (BACTROBAN) 2 % Apply 1 Application topically  daily. 30 g 2   nicotine (NICODERM CQ - DOSED IN MG/24 HOURS) 21 mg/24hr patch Place 1 patch (21 mg total) onto the skin daily. 28 patch 0   oxyCODONE (OXY IR/ROXICODONE) 5 MG immediate release tablet Take 1 tablet (5 mg total) by mouth every 6 (six) hours as needed for severe pain. 28 tablet 0   potassium chloride SA (KLOR-CON M) 20 MEQ tablet Take 1 tablet (20 mEq total) by mouth daily. 30 tablet 0   traZODone (DESYREL) 50 MG tablet Take 0.5-1 tablets (25-50 mg total) by mouth at bedtime as needed for sleep. 10 tablet 0   Ferrous Sulfate (IRON) 325 (65 Fe) MG TABS Take 1 tablet (325 mg total) by mouth daily. 60 tablet 0   hydrochlorothiazide (HYDRODIURIL) 25 MG tablet Take 1 tablet (25 mg total) by mouth daily. 30 tablet 0    No current facility-administered medications on file prior to visit.      There are no Patient Instructions on file for this visit. No follow-ups on file.     Georgiana Spinner, NP PAIN:  Are you having pain? No  PRECAUTIONS: Fall  WEIGHT BEARING RESTRICTIONS:  No  FALLS:  Has patient fallen in last 6 months? 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  Pt. Has applied for disability (pending).    PLOF: Independent  PATIENT GOALS: Return to walking with less assistance  NEXT MD VISIT: PRN  OBJECTIVE:   PATIENT SURVEYS:  FOTO initial 46/ goal 60  COGNITION: Overall cognitive status: Within functional limits for tasks assessed     SENSATION: WFL  EDEMA:  Circumferential: 2" superior to template: R 47 cm/ L 51.5 cm.  Recommended shrinker use  MUSCLE LENGTH: Hamstrings: Right WNL; Left WNL  POSTURE: rounded shoulders, esp. When ambulating with RW  PALPATION: No tenderness/ good incision healing.    LOWER EXTREMITY ROM:  Good L LE AROM (all planes).  Prone L hip  extension 25 deg.  B UE AROM WNL  LOWER EXTREMITY MMT:  B LE strength grossly 5/5 MMT except L hip adduction/ extension 4/5 MMT.  B UE strength 5/5 MMT.   FUNCTIONAL TESTS:  5 times sit to stand: TBD 6 minute walk test: TBD  GAIT: Distance walked: In clinic/ //-bars Assistive device utilized: Walker - 2 wheeled Level of assistance: CGA Comments: pt. Ambulates in //-bars with cuing to increase a more consistent BOS/ recip. Step pattern with L heel strike to enforce L knee control/ wt. Bearing.  Pt. Ambulates at home without prosthetic leg and use of RW household distances.    Pt. States she has had a difficult time with living situation/ moving a couple times since last PT appt.  Pt. Currently living with grandma's house with daughter/ granddaughter.  Pts. Currently residence has 2 steps to enter and using RW due to no handrails.  Pt. Unable to drive at this time.  Pt. Reports 7+ falls over past month due to L knee mechanism buckling when walking fast/ not focused on wt. Bearing.  Pt. arrived to PT with prosthetic donned and no complaints of pain or discomfort.  Pt. Stated she is happy with the way her prosthetic is currently fitting.  Pt. Is able to complete all of her personal hygiene tasks independently with use of RW 50% of the time and without any UE assistance 50% of the time.  Pt. Became labile at start of PT tx. Session when describing current situation/ not being able to work.  Pt. Taking sleeping meds per MD to improve rest at night.  Pt. Returns to [facility] on 4/24.    TODAY'S TREATMENT:                                                                                                                              DATE: 12/23/2022  Subjective:  Pt. Reports no falls since last PT tx. Session.  Pt. Reports minimal phantom limb symptoms recently.  Pt. Has not heard from disability lawyer.  Pt. Returns to vascular surgeon (Dr. Wyn Quaker) on 6/21.  Pt. Walking on gravel at home in driveway.  Pt.  Was able to walk from valet parking to elevators at [facility] with use of SPC/ good knee mechanism control.  Pt. States he dad was recently paralyzed in a MVA and is hoping to return home with nursing/ caregiver assist.  Pt. Mentioned that she may help him until home health nursing/ CNA can start.    There Ex Warm up:  No charge  NuStep L4 10 min. B UE/LE. Seat 9, arms 8. No reports of pain.   Neuro.mm.:  Ambulate in //-bars with Airex step ups/overs (R on Airex)- 3 laps.  Use of SPC only.    Stair training - ascending/ descending stairs with use of SPC on R and light L UE assist on handrail.   Good knee mechanism control.    Ambulate in hallway with use of SPC with gait belt donned for safety x 3 full laps.  Pt. Demonstrates consistent 2-point gait pattern with good L knee mechanism control/ heel strike.  Pt. Has increase episodes/ fear of knee buckling when walking faster with less UE assist.   Amb. Outside on ramp/ curbs/ parking lot/ grass with use of SPC and SBA/CGA for safety and cuing.  Pt. Ambulates slow, controlled down ramp and over grass.    Lateral walking in //-bars with light UE assist progressing to no UE assist to L/R (3 laps).  Mirror feedback for posture correction.    Reviewed goals/ HEP  PATIENT EDUCATION:  Education details: Access Code: [email or web address] Person educated: Patient Education method: Explanation, Demonstration, and Handouts Education comprehension: verbalized understanding and returned demonstration  HOME EXERCISE PROGRAM: Access Code: [email or web address] URL: [email or web address] Date: 07/20/2022 Prepared by: Dorene Grebe  Exercises - Side Stepping with Counter Support  - 1 x daily - 7 x weekly - 2 sets - 10 reps - Standing Hip Extension with Counter Support  - 1 x daily - 7 x weekly - 2 sets - 10 reps - Supine Single Leg Bridge with Sound Leg (AKA)  - 1 x daily - 7 x weekly - 2 sets - 10 reps - Prone Hip Extension with Residual Limb (AKA)   - 1 x daily - 7 x weekly - 2 sets - 10 reps - Sidelying Hip Abduction with Flexion and Extension (AKA)  - 1 x daily - 7 x weekly - 2 sets - 10 reps  ASSESSMENT:  CLINICAL IMPRESSION: Pt. Reports no pain currently with occasional increase in phantom limb symptoms.  Pt. Is continuing to advance her gait at home and is using her SPC more than her RW.  Pt. Challenged with outside ambulation with use of SPC down ramp/ grassy terrain.  Pt. Demonstrates good understanding/ technique with 2-point gait pattern with SPC and gait belt required for safety. No LOB during tx. But pt. More hesitant during SPC/ outside walking.  Pt. Has been walking with a slower, more controlled gait pattern.  Pt. Will benefit from skilled PT services to increase LE strength to improve independence and safety with ambulation.    OBJECTIVE IMPAIRMENTS: Abnormal gait, decreased activity tolerance, decreased balance, decreased endurance, decreased mobility, difficulty walking, decreased ROM, decreased strength, improper body mechanics, postural dysfunction, and pain.   ACTIVITY LIMITATIONS: carrying, lifting, bending, squatting, stairs, transfers, toileting, dressing, and locomotion level  PARTICIPATION LIMITATIONS: cleaning, laundry, driving, shopping, and community activity  PERSONAL FACTORS: Age, Education,  and Past/current experiences are also affecting patient's functional outcome.   REHAB POTENTIAL: Good  CLINICAL DECISION MAKING: Evolving/moderate complexity  EVALUATION COMPLEXITY: Moderate   GOALS: Goals reviewed with patient? Yes  LONG TERM GOALS: Target date: 02/17/23  Pt. Will increase FOTO to 57 to improve functional mobility.   Baseline:  initial 46.  4/11: 49.  6/13: 60  Goal status: Goal met  2.  Pt. Able to ambulate community distances with use of prosthetic leg/ mod. I with least assistive device to improve functional mobility.   Baseline:  CGA for short distances in //-bars.  4/11: amb. With mod. I  and use of RW but numerous falls. 6/13: more control, slower gait pattern Goal status: Partially met  3.  Pt. Able to ascend/ descend 2 flights of stairs with mod. I and use of single handrail safely.   Baseline: TBD.  4/11: pt. Requires use of B UE assist and cuing to slow down.  6/13: mod. Independent/ fearful Goal status: Partially met  4.  Pt. Will report no falls with household ambulation consistently for a month to improve safety/ mod. Independence at home.    Baseline: 7 falls in past month.  5/16: 1 fall since returning to PT.  6/13: no recent falls over past month.  Goal status: Partially met   PLAN:  PT FREQUENCY: Biweekly  PT DURATION: 8 weeks  PLANNED INTERVENTIONS: Therapeutic exercises, Therapeutic activity, Neuromuscular re-education, Balance training, Gait training, Patient/Family education, Self Care, Joint mobilization, Stair training, Prosthetic training, scar mobilization, Manual therapy, and Re-evaluation  PLAN FOR NEXT SESSION: Progress gait and stair training without assistive device, work on more dynamic tasks.  Outside walking.  Simulate pt. Assisting father with w/c transfers/ bed mobility               Cammie Mcgee, PT, DPT # [id number] 12/23/2022, 8:35 PM

## 2022-12-28 ENCOUNTER — Other Ambulatory Visit (INDEPENDENT_AMBULATORY_CARE_PROVIDER_SITE_OTHER): Payer: Self-pay | Admitting: Nurse Practitioner

## 2022-12-28 DIAGNOSIS — I739 Peripheral vascular disease, unspecified: Secondary | ICD-10-CM

## 2022-12-31 ENCOUNTER — Ambulatory Visit (INDEPENDENT_AMBULATORY_CARE_PROVIDER_SITE_OTHER): Payer: Medicaid Other

## 2022-12-31 ENCOUNTER — Ambulatory Visit (INDEPENDENT_AMBULATORY_CARE_PROVIDER_SITE_OTHER): Payer: MEDICAID | Admitting: Vascular Surgery

## 2022-12-31 DIAGNOSIS — I739 Peripheral vascular disease, unspecified: Secondary | ICD-10-CM

## 2022-12-31 DIAGNOSIS — Z9889 Other specified postprocedural states: Secondary | ICD-10-CM

## 2023-01-04 LAB — VAS US ABI WITH/WO TBI: Right ABI: 0.91

## 2023-01-06 ENCOUNTER — Encounter: Payer: Self-pay | Admitting: Physical Therapy

## 2023-01-06 ENCOUNTER — Ambulatory Visit: Payer: Medicaid Other

## 2023-01-06 DIAGNOSIS — M6281 Muscle weakness (generalized): Secondary | ICD-10-CM

## 2023-01-06 DIAGNOSIS — R269 Unspecified abnormalities of gait and mobility: Secondary | ICD-10-CM

## 2023-01-06 DIAGNOSIS — Z89612 Acquired absence of left leg above knee: Secondary | ICD-10-CM

## 2023-01-06 NOTE — Therapy (Signed)
OUTPATIENT PHYSICAL THERAPY LOWER EXTREMITY TREATMENT/ RECERTIFICATION  Patient Name: Jocelyn [name] MRN: [id number] DOB:06-10-1981, 42 y.o., female Today's Date: 01/06/2023  END OF SESSION:  PT End of Session - 01/06/23 1425     Visit Number 12    Number of Visits 15    Date for PT Re-Evaluation 02/17/23    PT Start Time 1426    PT Stop Time 1512    PT Time Calculation (min) 46 min    Equipment Utilized During Treatment Gait belt    Activity Tolerance Patient tolerated treatment well    Behavior During Therapy WFL for tasks assessed/performed              Past Medical History:  Diagnosis Date   IDA (iron deficiency anemia) 12/17/2019   Past Surgical History:  Procedure Laterality Date   AMPUTATION Left 01/20/2022   Procedure: AMPUTATION ABOVE KNEE;  Surgeon: Annice Needy, MD;  Location: [facility];  Service: Vascular;  Laterality: Left;   APPENDECTOMY     LOWER EXTREMITY ANGIOGRAPHY Left 01/11/2020   Procedure: Lower Extremity Angiography;  Surgeon: Annice Needy, MD;  Location: [facility];  Service: Cardiovascular;  Laterality: Left;   LOWER EXTREMITY ANGIOGRAPHY Left 03/06/2020   Procedure: Lower Extremity Angiography;  Surgeon: Annice Needy, MD;  Location: [facility];  Service: Cardiovascular;  Laterality: Left;   LOWER EXTREMITY ANGIOGRAPHY Left 03/07/2020   Procedure: Lower Extremity Angiography;  Surgeon: Annice Needy, MD;  Location: [facility];  Service: Cardiovascular;  Laterality: Left;   LOWER EXTREMITY ANGIOGRAPHY Left 01/06/2022   Procedure: Lower Extremity Angiography;  Surgeon: Annice Needy, MD;  Location: [facility];  Service: Cardiovascular;  Laterality: Left;   LOWER EXTREMITY ANGIOGRAPHY Left 01/07/2022   Procedure: Lower Extremity Angiography;  Surgeon: Annice Needy, MD;  Location: [facility];  Service: Cardiovascular;  Laterality: Left;   LOWER EXTREMITY ANGIOGRAPHY Left 01/18/2022   Procedure: Lower Extremity  Angiography;  Surgeon: Annice Needy, MD;  Location: [facility];  Service: Cardiovascular;  Laterality: Left;   LOWER EXTREMITY INTERVENTION Bilateral 01/17/2022   Procedure: LOWER EXTREMITY INTERVENTION;  Surgeon: Learta Codding, MD;  Location: [facility];  Service: Cardiovascular;  Laterality: Bilateral;   PERIPHERAL VASCULAR THROMBECTOMY Left 01/20/2022   Procedure: PERIPHERAL VASCULAR THROMBECTOMY Left Femoral vein;  Surgeon: Annice Needy, MD;  Location: [facility];  Service: Vascular;  Laterality: Left;   Patient Active Problem List   Diagnosis Date Noted   S/P AKA (above knee amputation) unilateral, left (HCC) 01/25/2022   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   Acute urinary retention 01/19/2022   Fever 01/18/2022   Acute deep vein thrombosis (DVT) of left femoral vein (HCC) 01/17/2022   Hypomagnesemia 01/06/2022   Hyponatremia 01/06/2022   Critical limb ischemia of left lower extremity (HCC) 01/10/2020   Diabetes mellitus without complication (HCC) 12/17/2019   IDA (iron deficiency anemia) 12/17/2019   HTN (hypertension) 12/17/2019    PCP: [facility]  REFERRING PROVIDER: Georgiana Spinner, NP  REFERRING DIAG: S/P AKA (above knee amputation) unilateral, left (HCC)  THERAPY DIAG:  S/P AKA (above knee amputation) unilateral, left (HCC)  Muscle weakness (generalized)  Gait difficulty  Rationale for Evaluation and Treatment: Rehabilitation  ONSET DATE: 01/2023  SUBJECTIVE:   SUBJECTIVE STATEMENT: Pt. Reports having increase blood clots after receiving Covid vaccine 3 years ago. Pt. Had multiple surgeries resulting in L AKA on 01/20/22.  Pt.  Received prosthetic leg 1 month ago from [employer].  Pt. Was delayed with receiving leg secondary to insurance issues.  Pt. Has not walked with prosthetic leg at this time.  No c/o pain but phantom limb symptoms.  Pt. Has not been compliant with donning shrinker or HEP.    PERTINENT HISTORY: 1. S/P AKA  (above knee amputation) unilateral, left (HCC) The patient will continue with physical therapy.  Patient will follow-up if there are issues with her residual limb. 2. Primary hypertension Continue antihypertensive medications as already ordered, these medications have been reviewed and there are no changes at this time.    3. Peripheral arterial disease with history of revascularization (HCC)  Recommend:   The patient has evidence of atherosclerosis of the lower extremities with claudication.  The patient does not voice lifestyle limiting changes at this point in time.   Noninvasive studies do not suggest clinically significant change.   No invasive studies, angiography or surgery at this time The patient should continue walking and begin a more formal exercise program.  The patient should continue antiplatelet therapy and aggressive treatment of the lipid abnormalities   No changes in the patient's medications at this time   Continued surveillance is indicated as atherosclerosis is likely to progress with time.     The patient will continue follow up with noninvasive studies as ordered.  The patient follow-up in 6 months or sooner if issues arise.           Current Outpatient Medications on File Prior to Visit  Medication Sig Dispense Refill   acetaminophen (TYLENOL) 325 MG tablet Take 1-2 tablets (325-650 mg total) by mouth every 4 (four) hours as needed for mild pain.       amLODipine (NORVASC) 10 MG tablet Take 1 tablet (10 mg total) by mouth daily. 30 tablet 0   apixaban (ELIQUIS) 5 MG TABS tablet Take 1 tablet (5 mg total) by mouth 2 (two) times daily. 60 tablet 1   gabapentin (NEURONTIN) 600 MG tablet Take 1 tablet (600 mg total) by mouth 3 (three) times daily. 90 tablet 0   methocarbamol (ROBAXIN) 500 MG tablet Take 1 tablet (500 mg total) by mouth every 6 (six) hours as needed for muscle spasms. 60 tablet 0   mupirocin ointment (BACTROBAN) 2 % Apply 1 Application topically  daily. 30 g 2   nicotine (NICODERM CQ - DOSED IN MG/24 HOURS) 21 mg/24hr patch Place 1 patch (21 mg total) onto the skin daily. 28 patch 0   oxyCODONE (OXY IR/ROXICODONE) 5 MG immediate release tablet Take 1 tablet (5 mg total) by mouth every 6 (six) hours as needed for severe pain. 28 tablet 0   potassium chloride SA (KLOR-CON M) 20 MEQ tablet Take 1 tablet (20 mEq total) by mouth daily. 30 tablet 0   traZODone (DESYREL) 50 MG tablet Take 0.5-1 tablets (25-50 mg total) by mouth at bedtime as needed for sleep. 10 tablet 0   Ferrous Sulfate (IRON) 325 (65 Fe) MG TABS Take 1 tablet (325 mg total) by mouth daily. 60 tablet 0   hydrochlorothiazide (HYDRODIURIL) 25 MG tablet Take 1 tablet (25 mg total) by mouth daily. 30 tablet 0    No current facility-administered medications on file prior to visit.      There are no Patient Instructions on file for this visit. No follow-ups on file.     Georgiana Spinner, NP PAIN:  Are you having pain? No  PRECAUTIONS: Fall  WEIGHT BEARING  RESTRICTIONS: No  FALLS:  Has patient fallen in last 6 months? 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  Pt. Has applied for disability (pending).    PLOF: Independent  PATIENT GOALS: Return to walking with less assistance  NEXT MD VISIT: PRN  OBJECTIVE:   PATIENT SURVEYS:  FOTO initial 46/ goal 76  COGNITION: Overall cognitive status: Within functional limits for tasks assessed     SENSATION: WFL  EDEMA:  Circumferential: 2" superior to template: R 47 cm/ L 51.5 cm.  Recommended shrinker use  MUSCLE LENGTH: Hamstrings: Right WNL; Left WNL  POSTURE: rounded shoulders, esp. When ambulating with RW  PALPATION: No tenderness/ good incision healing.    LOWER EXTREMITY ROM:  Good L LE AROM (all planes).  Prone L hip  extension 25 deg.  B UE AROM WNL  LOWER EXTREMITY MMT:  B LE strength grossly 5/5 MMT except L hip adduction/ extension 4/5 MMT.  B UE strength 5/5 MMT.   FUNCTIONAL TESTS:  5 times sit to stand: TBD 6 minute walk test: TBD  GAIT: Distance walked: In clinic/ //-bars Assistive device utilized: Marlyne Totaro - 2 wheeled Level of assistance: CGA Comments: pt. Ambulates in //-bars with cuing to increase a more consistent BOS/ recip. Step pattern with L heel strike to enforce L knee control/ wt. Bearing.  Pt. Ambulates at home without prosthetic leg and use of RW household distances.    Pt. States she has had a difficult time with living situation/ moving a couple times since last PT appt.  Pt. Currently living with grandma's house with daughter/ granddaughter.  Pts. Currently residence has 2 steps to enter and using RW due to no handrails.  Pt. Unable to drive at this time.  Pt. Reports 7+ falls over past month due to L knee mechanism buckling when walking fast/ not focused on wt. Bearing.  Pt. arrived to PT with prosthetic donned and no complaints of pain or discomfort.  Pt. Stated she is happy with the way her prosthetic is currently fitting.  Pt. Is able to complete all of her personal hygiene tasks independently with use of RW 50% of the time and without any UE assistance 50% of the time.  Pt. Became labile at start of PT tx. Session when describing current situation/ not being able to work.  Pt. Taking sleeping meds per MD to improve rest at night.  Pt. Returns to [facility] on 4/24.    TODAY'S TREATMENT:                                                                                                                              DATE: 01/06/2023  Subjective:  Patient reports 1 fall 2 days ago while walking to the dumpster. Saw Kathlene November (prosthetist) yesterday and  was remeasured for a new prosthetic leg. Her dad has not returned home but is planning on returning home on the 9th.     There Ex Warm up:  No  charge  NuStep L4 8 min. B UE/LE. Seat 9, arms 8. No reports of pain.   Neuro.mm.:  Ambulate in //-bars with Airex step ups/overs (R on Airex)- 3 laps.  Use of SPC only.    Stair training - ascending/ descending stairs with use of SPC on R and light L UE assist on handrail.   Good knee mechanism control.    Ambulate in hallway with use of SPC with gait belt donned for safety x 3 full laps.  Pt. Demonstrates consistent 2-point gait pattern with good L knee mechanism control/ heel strike.  Pt. Has increase episodes/ fear of knee buckling when walking faster with less UE assist.   Amb. Outside on ramp/ curbs/ parking lot/ grass with use of SPC and SBA/CGA for safety and cuing.  Pt. Ambulates slow, controlled down ramp and over grass.    Lateral walking in //-bars with light UE assist progressing to no UE assist to L/R (3 laps).  Mirror feedback for posture correction.     PATIENT EDUCATION:  Education details: Access Code: [email or web address] Person educated: Patient Education method: Explanation, Demonstration, and Handouts Education comprehension: verbalized understanding and returned demonstration  HOME EXERCISE PROGRAM: Access Code: [email or web address] URL: [email or web address] Date: 07/20/2022 Prepared by: Dorene Grebe  Exercises - Side Stepping with Counter Support  - 1 x daily - 7 x weekly - 2 sets - 10 reps - Standing Hip Extension with Counter Support  - 1 x daily - 7 x weekly - 2 sets - 10 reps - Supine Single Leg Bridge with Sound Leg (AKA)  - 1 x daily - 7 x weekly - 2 sets - 10 reps - Prone Hip Extension with Residual Limb (AKA)  - 1 x daily - 7 x weekly - 2 sets - 10 reps - Sidelying Hip Abduction with Flexion and Extension (AKA)  - 1 x daily - 7 x weekly - 2 sets - 10 reps  ASSESSMENT:  CLINICAL IMPRESSION:   Patient denies pain at beginning of session but experiencing pinching in upper thigh. Continues to utilize [facility] for gait with improving independence with use of SPC.  Patient's ultimate goal is to ambulate with no AD. Session focused on ambulation with no AD in // bars and outdoor ambulation with use of SPC and gait belt for safety. Continues to be hesitant with outdoor walking.  Patient will benefit from skilled PT services to increase LE strength to improve independence and safety with ambulation.      OBJECTIVE IMPAIRMENTS: Abnormal gait, decreased activity tolerance, decreased balance, decreased endurance, decreased mobility, difficulty walking, decreased ROM, decreased strength, improper body mechanics, postural dysfunction, and pain.   ACTIVITY LIMITATIONS: carrying, lifting, bending, squatting, stairs, transfers, toileting, dressing, and locomotion level  PARTICIPATION LIMITATIONS: cleaning, laundry, driving, shopping, and community activity  PERSONAL FACTORS: Age, Education, and Past/current experiences are also affecting patient's functional outcome.   REHAB POTENTIAL: Good  CLINICAL DECISION MAKING: Evolving/moderate complexity  EVALUATION COMPLEXITY: Moderate   GOALS: Goals reviewed with patient? Yes  LONG TERM GOALS: Target date: 02/17/23  Pt. Will increase FOTO to 57 to improve functional mobility.   Baseline:  initial 46.  4/11: 49.  6/13: 60  Goal status: Goal met  2.  Pt. Able to ambulate community distances with use of prosthetic leg/ mod. I with  least assistive device to improve functional mobility.   Baseline:  CGA for short distances in //-bars.  4/11: amb. With mod. I and use of RW but numerous falls. 6/13: more control, slower gait pattern Goal status: Partially met  3.  Pt. Able to ascend/ descend 2 flights of stairs with mod. I and use of single handrail safely.   Baseline: TBD.  4/11: pt. Requires use of B UE assist and cuing to slow down.  6/13: mod. Independent/ fearful Goal status: Partially met  4.  Pt. Will report no falls with household ambulation consistently for a month to improve safety/ mod. Independence at home.     Baseline: 7 falls in past month.  5/16: 1 fall since returning to PT.  6/13: no recent falls over past month.  Goal status: Partially met   PLAN:  PT FREQUENCY: Biweekly  PT DURATION: 8 weeks  PLANNED INTERVENTIONS: Therapeutic exercises, Therapeutic activity, Neuromuscular re-education, Balance training, Gait training, Patient/Family education, Self Care, Joint mobilization, Stair training, Prosthetic training, scar mobilization, Manual therapy, and Re-evaluation  PLAN FOR NEXT SESSION: Progress gait and stair training without assistive device, work on more dynamic tasks.  Outside walking.  Simulate pt. Assisting father with w/c transfers/ bed mobility               Maylon Peppers, PT, DPT Physical Therapist - [facility]  [facility]  01/06/2023, 2:25 PM

## 2023-01-20 ENCOUNTER — Encounter: Payer: Self-pay | Admitting: Physical Therapy

## 2023-01-20 ENCOUNTER — Ambulatory Visit: Payer: 59 | Attending: Nurse Practitioner

## 2023-01-20 DIAGNOSIS — M6281 Muscle weakness (generalized): Secondary | ICD-10-CM

## 2023-01-20 DIAGNOSIS — R269 Unspecified abnormalities of gait and mobility: Secondary | ICD-10-CM | POA: Diagnosis present

## 2023-01-20 DIAGNOSIS — Z89612 Acquired absence of left leg above knee: Secondary | ICD-10-CM

## 2023-01-20 NOTE — Therapy (Signed)
OUTPATIENT PHYSICAL THERAPY LOWER EXTREMITY TREATMENT  Patient Name: Jocelyn [name] MRN: [id number] DOB:[birth year], 42 y.o., female Today's Date: 01/20/2023  END OF SESSION:  PT End of Session - 01/20/23 1504     Visit Number 13    Number of Visits 15    Date for PT Re-Evaluation 02/17/23    PT Start Time 1515    PT Stop Time 1559    PT Time Calculation (min) 44 min    Equipment Utilized During Treatment Gait belt    Activity Tolerance Patient tolerated treatment well    Behavior During Therapy WFL for tasks assessed/performed              Past Medical History:  Diagnosis Date   IDA (iron deficiency anemia) 12/17/2019   Past Surgical History:  Procedure Laterality Date   AMPUTATION Left 01/20/2022   Procedure: AMPUTATION ABOVE KNEE;  Surgeon: Annice Needy, MD;  Location: [facility];  Service: Vascular;  Laterality: Left;   APPENDECTOMY     LOWER EXTREMITY ANGIOGRAPHY Left 01/11/2020   Procedure: Lower Extremity Angiography;  Surgeon: Annice Needy, MD;  Location: [facility];  Service: Cardiovascular;  Laterality: Left;   LOWER EXTREMITY ANGIOGRAPHY Left 03/06/2020   Procedure: Lower Extremity Angiography;  Surgeon: Annice Needy, MD;  Location: [facility];  Service: Cardiovascular;  Laterality: Left;   LOWER EXTREMITY ANGIOGRAPHY Left 03/07/2020   Procedure: Lower Extremity Angiography;  Surgeon: Annice Needy, MD;  Location: [facility];  Service: Cardiovascular;  Laterality: Left;   LOWER EXTREMITY ANGIOGRAPHY Left 01/06/2022   Procedure: Lower Extremity Angiography;  Surgeon: Annice Needy, MD;  Location: [facility];  Service: Cardiovascular;  Laterality: Left;   LOWER EXTREMITY ANGIOGRAPHY Left 01/07/2022   Procedure: Lower Extremity Angiography;  Surgeon: Annice Needy, MD;  Location: [facility];  Service: Cardiovascular;  Laterality: Left;   LOWER EXTREMITY ANGIOGRAPHY Left 01/18/2022   Procedure: Lower Extremity Angiography;   Surgeon: Annice Needy, MD;  Location: [facility];  Service: Cardiovascular;  Laterality: Left;   LOWER EXTREMITY INTERVENTION Bilateral 01/17/2022   Procedure: LOWER EXTREMITY INTERVENTION;  Surgeon: Learta Codding, MD;  Location: [facility];  Service: Cardiovascular;  Laterality: Bilateral;   PERIPHERAL VASCULAR THROMBECTOMY Left 01/20/2022   Procedure: PERIPHERAL VASCULAR THROMBECTOMY Left Femoral vein;  Surgeon: Annice Needy, MD;  Location: [facility];  Service: Vascular;  Laterality: Left;   Patient Active Problem List   Diagnosis Date Noted   S/P AKA (above knee amputation) unilateral, left (HCC) 01/25/2022   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   Acute urinary retention 01/19/2022   Fever 01/18/2022   Acute deep vein thrombosis (DVT) of left femoral vein (HCC) 01/17/2022   Hypomagnesemia 01/06/2022   Hyponatremia 01/06/2022   Critical limb ischemia of left lower extremity (HCC) 01/10/2020   Diabetes mellitus without complication (HCC) 12/17/2019   IDA (iron deficiency anemia) 12/17/2019   HTN (hypertension) 12/17/2019    PCP: [facility]  REFERRING PROVIDER: Georgiana Spinner, NP  REFERRING DIAG: S/P AKA (above knee amputation) unilateral, left (HCC)  THERAPY DIAG:  S/P AKA (above knee amputation) unilateral, left (HCC)  Muscle weakness (generalized)  Gait difficulty  Rationale for Evaluation and Treatment: Rehabilitation  ONSET DATE: 01/2023  SUBJECTIVE:   SUBJECTIVE STATEMENT: Pt. Reports having increase blood clots after receiving Covid vaccine 3 years ago. Pt. Had multiple surgeries resulting in L AKA on 01/20/22.  Pt. Received  prosthetic leg 1 month ago from [facility].  Pt. Was delayed with receiving leg secondary to insurance issues.  Pt. Has not walked with prosthetic leg at this time.  No c/o pain but phantom limb symptoms.  Pt. Has not been compliant with donning shrinker or HEP.    PERTINENT HISTORY: 1. S/P AKA (above knee  amputation) unilateral, left (HCC) The patient will continue with physical therapy.  Patient will follow-up if there are issues with her residual limb. 2. Primary hypertension Continue antihypertensive medications as already ordered, these medications have been reviewed and there are no changes at this time.    3. Peripheral arterial disease with history of revascularization (HCC)  Recommend:   The patient has evidence of atherosclerosis of the lower extremities with claudication.  The patient does not voice lifestyle limiting changes at this point in time.   Noninvasive studies do not suggest clinically significant change.   No invasive studies, angiography or surgery at this time The patient should continue walking and begin a more formal exercise program.  The patient should continue antiplatelet therapy and aggressive treatment of the lipid abnormalities   No changes in the patient's medications at this time   Continued surveillance is indicated as atherosclerosis is likely to progress with time.     The patient will continue follow up with noninvasive studies as ordered.  The patient follow-up in 6 months or sooner if issues arise.           Current Outpatient Medications on File Prior to Visit  Medication Sig Dispense Refill   acetaminophen (TYLENOL) 325 MG tablet Take 1-2 tablets (325-650 mg total) by mouth every 4 (four) hours as needed for mild pain.       amLODipine (NORVASC) 10 MG tablet Take 1 tablet (10 mg total) by mouth daily. 30 tablet 0   apixaban (ELIQUIS) 5 MG TABS tablet Take 1 tablet (5 mg total) by mouth 2 (two) times daily. 60 tablet 1   gabapentin (NEURONTIN) 600 MG tablet Take 1 tablet (600 mg total) by mouth 3 (three) times daily. 90 tablet 0   methocarbamol (ROBAXIN) 500 MG tablet Take 1 tablet (500 mg total) by mouth every 6 (six) hours as needed for muscle spasms. 60 tablet 0   mupirocin ointment (BACTROBAN) 2 % Apply 1 Application topically daily. 30 g 2    nicotine (NICODERM CQ - DOSED IN MG/24 HOURS) 21 mg/24hr patch Place 1 patch (21 mg total) onto the skin daily. 28 patch 0   oxyCODONE (OXY IR/ROXICODONE) 5 MG immediate release tablet Take 1 tablet (5 mg total) by mouth every 6 (six) hours as needed for severe pain. 28 tablet 0   potassium chloride SA (KLOR-CON M) 20 MEQ tablet Take 1 tablet (20 mEq total) by mouth daily. 30 tablet 0   traZODone (DESYREL) 50 MG tablet Take 0.5-1 tablets (25-50 mg total) by mouth at bedtime as needed for sleep. 10 tablet 0   Ferrous Sulfate (IRON) 325 (65 Fe) MG TABS Take 1 tablet (325 mg total) by mouth daily. 60 tablet 0   hydrochlorothiazide (HYDRODIURIL) 25 MG tablet Take 1 tablet (25 mg total) by mouth daily. 30 tablet 0    No current facility-administered medications on file prior to visit.      There are no Patient Instructions on file for this visit. No follow-ups on file.     Georgiana Spinner, NP PAIN:  Are you having pain? No  PRECAUTIONS: Fall  WEIGHT BEARING RESTRICTIONS:  No  FALLS:  Has patient fallen in last 6 months? 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  Pt. Has applied for disability (pending).    PLOF: Independent  PATIENT GOALS: Return to walking with less assistance  NEXT MD VISIT: PRN  OBJECTIVE:   PATIENT SURVEYS:  FOTO initial 46/ goal 79  COGNITION: Overall cognitive status: Within functional limits for tasks assessed     SENSATION: WFL  EDEMA:  Circumferential: 2" superior to template: R 47 cm/ L 51.5 cm.  Recommended shrinker use  MUSCLE LENGTH: Hamstrings: Right WNL; Left WNL  POSTURE: rounded shoulders, esp. When ambulating with RW  PALPATION: No tenderness/ good incision healing.    LOWER EXTREMITY ROM:  Good L LE AROM (all planes).  Prone L hip extension 25  deg.  B UE AROM WNL  LOWER EXTREMITY MMT:  B LE strength grossly 5/5 MMT except L hip adduction/ extension 4/5 MMT.  B UE strength 5/5 MMT.   FUNCTIONAL TESTS:  5 times sit to stand: TBD 6 minute walk test: TBD  GAIT: Distance walked: In clinic/ //-bars Assistive device utilized: Amberlin Utke - 2 wheeled Level of assistance: CGA Comments: pt. Ambulates in //-bars with cuing to increase a more consistent BOS/ recip. Step pattern with L heel strike to enforce L knee control/ wt. Bearing.  Pt. Ambulates at home without prosthetic leg and use of RW household distances.    Pt. States she has had a difficult time with living situation/ moving a couple times since last PT appt.  Pt. Currently living with grandma's house with daughter/ granddaughter.  Pts. Currently residence has 2 steps to enter and using RW due to no handrails.  Pt. Unable to drive at this time.  Pt. Reports 7+ falls over past month due to L knee mechanism buckling when walking fast/ not focused on wt. Bearing.  Pt. arrived to PT with prosthetic donned and no complaints of pain or discomfort.  Pt. Stated she is happy with the way her prosthetic is currently fitting.  Pt. Is able to complete all of her personal hygiene tasks independently with use of RW 50% of the time and without any UE assistance 50% of the time.  Pt. Became labile at start of PT tx. Session when describing current situation/ not being able to work.  Pt. Taking sleeping meds per MD to improve rest at night.  Pt. Returns to [facility] on 4/24.   TODAY'S TREATMENT:                                                                                                                              DATE: 01/20/2023  Subjective: Patient reports no falls since last session. Reports her dad came home on July 9th and has currently been doing  all aid related tasks as unable to obtain [facility] aide until ~September.  There Ex Warm up:  No charge  NuStep L4 8 min. B UE/LE. Seat 9, arms 8. No  reports of pain.   Neuro.mm.:  Ambulate in //-bars with Airex step ups/overs (R on Airex)- 3 laps.  No AD.    Stair training 4 steps x 2- ascending/ descending stairs with use of SPC on R and light L UE assist on handrail.   Good knee mechanism control.    Lateral/fwd walking in agility ladder with SPC in hand but not utilizing (3 laps each direction).    PATIENT EDUCATION:  Education details: Access Code: [email or web address] Person educated: Patient Education method: Explanation, Demonstration, and Handouts Education comprehension: verbalized understanding and returned demonstration  HOME EXERCISE PROGRAM: Access Code: [email or web address] URL: [email or web address] Date: 07/20/2022 Prepared by: Dorene Grebe  Exercises - Side Stepping with Counter Support  - 1 x daily - 7 x weekly - 2 sets - 10 reps - Standing Hip Extension with Counter Support  - 1 x daily - 7 x weekly - 2 sets - 10 reps - Supine Single Leg Bridge with Sound Leg (AKA)  - 1 x daily - 7 x weekly - 2 sets - 10 reps - Prone Hip Extension with Residual Limb (AKA)  - 1 x daily - 7 x weekly - 2 sets - 10 reps - Sidelying Hip Abduction with Flexion and Extension (AKA)  - 1 x daily - 7 x weekly - 2 sets - 10 reps  ASSESSMENT:  CLINICAL IMPRESSION:   Patient denies pain at beginning of session but experiencing pinching in upper thigh. Continues to utilize [facility] for gait with improving independence with use of SPC. Patient's ultimate goal is to ambulate with no AD. Session focused on ambulation with no AD fwd/lateral as well as stair training.  Patient will benefit from skilled PT services to increase LE strength to improve independence and safety with ambulation.      OBJECTIVE IMPAIRMENTS: Abnormal gait, decreased activity tolerance, decreased balance, decreased endurance, decreased mobility, difficulty walking, decreased ROM, decreased strength, improper body mechanics, postural dysfunction, and pain.   ACTIVITY LIMITATIONS:  carrying, lifting, bending, squatting, stairs, transfers, toileting, dressing, and locomotion level  PARTICIPATION LIMITATIONS: cleaning, laundry, driving, shopping, and community activity  PERSONAL FACTORS: Age, Education, and Past/current experiences are also affecting patient's functional outcome.   REHAB POTENTIAL: Good  CLINICAL DECISION MAKING: Evolving/moderate complexity  EVALUATION COMPLEXITY: Moderate   GOALS: Goals reviewed with patient? Yes  LONG TERM GOALS: Target date: 02/17/23  Pt. Will increase FOTO to 57 to improve functional mobility.   Baseline:  initial 46.  4/11: 49.  6/13: 60  Goal status: Goal met  2.  Pt. Able to ambulate community distances with use of prosthetic leg/ mod. I with least assistive device to improve functional mobility.   Baseline:  CGA for short distances in //-bars.  4/11: amb. With mod. I and use of RW but numerous falls. 6/13: more control, slower gait pattern Goal status: Partially met  3.  Pt. Able to ascend/ descend 2 flights of stairs with mod. I and use of single handrail safely.   Baseline: TBD.  4/11: pt. Requires use of B UE assist and cuing to slow down.  6/13: mod. Independent/ fearful Goal status: Partially met  4.  Pt. Will report no falls with household ambulation consistently for a month to improve safety/ mod. Independence at home.    Baseline: 7 falls in  past month.  5/16: 1 fall since returning to PT.  6/13: no recent falls over past month.  Goal status: Partially met   PLAN:  PT FREQUENCY: Biweekly  PT DURATION: 8 weeks  PLANNED INTERVENTIONS: Therapeutic exercises, Therapeutic activity, Neuromuscular re-education, Balance training, Gait training, Patient/Family education, Self Care, Joint mobilization, Stair training, Prosthetic training, scar mobilization, Manual therapy, and Re-evaluation  PLAN FOR NEXT SESSION: Progress gait and stair training without assistive device, work on more dynamic tasks.  Outside  walking.  Simulate pt. Assisting father with w/c transfers/ bed mobility               Maylon Peppers, PT, DPT Physical Therapist - [facility]  [facility]  01/20/2023, 3:05 PM

## 2023-02-03 ENCOUNTER — Ambulatory Visit: Payer: 59 | Admitting: Physical Therapy

## 2023-02-17 ENCOUNTER — Ambulatory Visit: Payer: Medicaid Other | Admitting: Physical Therapy

## 2023-02-25 ENCOUNTER — Ambulatory Visit: Payer: Medicaid Other | Attending: Nurse Practitioner | Admitting: Physical Therapy

## 2023-02-25 ENCOUNTER — Encounter: Payer: Self-pay | Admitting: Physical Therapy

## 2023-02-25 DIAGNOSIS — M6281 Muscle weakness (generalized): Secondary | ICD-10-CM | POA: Diagnosis present

## 2023-02-25 DIAGNOSIS — Z89612 Acquired absence of left leg above knee: Secondary | ICD-10-CM | POA: Insufficient documentation

## 2023-02-25 DIAGNOSIS — R269 Unspecified abnormalities of gait and mobility: Secondary | ICD-10-CM | POA: Diagnosis present

## 2023-02-25 NOTE — Therapy (Addendum)
OUTPATIENT PHYSICAL THERAPY LOWER EXTREMITY TREATMENT/ RECERTIFICATION  Patient Name: Jocelyn [name] MRN: [id number] DOB:01/27/1981, 42 y.o., female Today's Date: 02/25/2023  END OF SESSION:  PT End of Session - 02/25/23 0853     Visit Number 14    Number of Visits 22    Date for PT Re-Evaluation 04/22/23    PT Start Time 0854    PT Stop Time 0946    PT Time Calculation (min) 52 min    Equipment Utilized During Treatment Gait belt    Activity Tolerance Patient tolerated treatment well    Behavior During Therapy WFL for tasks assessed/performed              Past Medical History:  Diagnosis Date   IDA (iron deficiency anemia) 12/17/2019   Past Surgical History:  Procedure Laterality Date   AMPUTATION Left 01/20/2022   Procedure: AMPUTATION ABOVE KNEE;  Surgeon: Annice Needy, MD;  Location: [facility];  Service: Vascular;  Laterality: Left;   APPENDECTOMY     LOWER EXTREMITY ANGIOGRAPHY Left 01/11/2020   Procedure: Lower Extremity Angiography;  Surgeon: Annice Needy, MD;  Location: [facility];  Service: Cardiovascular;  Laterality: Left;   LOWER EXTREMITY ANGIOGRAPHY Left 03/06/2020   Procedure: Lower Extremity Angiography;  Surgeon: Annice Needy, MD;  Location: [facility];  Service: Cardiovascular;  Laterality: Left;   LOWER EXTREMITY ANGIOGRAPHY Left 03/07/2020   Procedure: Lower Extremity Angiography;  Surgeon: Annice Needy, MD;  Location: [facility];  Service: Cardiovascular;  Laterality: Left;   LOWER EXTREMITY ANGIOGRAPHY Left 01/06/2022   Procedure: Lower Extremity Angiography;  Surgeon: Annice Needy, MD;  Location: [facility];  Service: Cardiovascular;  Laterality: Left;   LOWER EXTREMITY ANGIOGRAPHY Left 01/07/2022   Procedure: Lower Extremity Angiography;  Surgeon: Annice Needy, MD;  Location: [facility];  Service: Cardiovascular;  Laterality: Left;   LOWER EXTREMITY ANGIOGRAPHY Left 01/18/2022   Procedure: Lower Extremity  Angiography;  Surgeon: Annice Needy, MD;  Location: [facility];  Service: Cardiovascular;  Laterality: Left;   LOWER EXTREMITY INTERVENTION Bilateral 01/17/2022   Procedure: LOWER EXTREMITY INTERVENTION;  Surgeon: Learta Codding, MD;  Location: [facility];  Service: Cardiovascular;  Laterality: Bilateral;   PERIPHERAL VASCULAR THROMBECTOMY Left 01/20/2022   Procedure: PERIPHERAL VASCULAR THROMBECTOMY Left Femoral vein;  Surgeon: Annice Needy, MD;  Location: [facility];  Service: Vascular;  Laterality: Left;   Patient Active Problem List   Diagnosis Date Noted   S/P AKA (above knee amputation) unilateral, left (HCC) 01/25/2022   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   Acute urinary retention 01/19/2022   Fever 01/18/2022   Acute deep vein thrombosis (DVT) of left femoral vein (HCC) 01/17/2022   Hypomagnesemia 01/06/2022   Hyponatremia 01/06/2022   Critical limb ischemia of left lower extremity (HCC) 01/10/2020   Diabetes mellitus without complication (HCC) 12/17/2019   IDA (iron deficiency anemia) 12/17/2019   HTN (hypertension) 12/17/2019    PCP: [facility]  REFERRING PROVIDER: Georgiana Spinner, NP  REFERRING DIAG: S/P AKA (above knee amputation) unilateral, left (HCC)  THERAPY DIAG:  S/P AKA (above knee amputation) unilateral, left (HCC)  Muscle weakness (generalized)  Gait difficulty  Rationale for Evaluation and Treatment: Rehabilitation  ONSET DATE: 01/2023  SUBJECTIVE:   SUBJECTIVE STATEMENT: Pt. Reports having increase blood clots after receiving Covid vaccine 3 years ago. Pt. Had multiple surgeries resulting in L AKA on 01/20/22.  Pt.  Received prosthetic leg 1 month ago from [facility].  Pt. Was delayed with receiving leg secondary to insurance issues.  Pt. Has not walked with prosthetic leg at this time.  No c/o pain but phantom limb symptoms.  Pt. Has not been compliant with donning shrinker or HEP.    PERTINENT HISTORY: 1. S/P AKA  (above knee amputation) unilateral, left (HCC) The patient will continue with physical therapy.  Patient will follow-up if there are issues with her residual limb. 2. Primary hypertension Continue antihypertensive medications as already ordered, these medications have been reviewed and there are no changes at this time.    3. Peripheral arterial disease with history of revascularization (HCC)  Recommend:   The patient has evidence of atherosclerosis of the lower extremities with claudication.  The patient does not voice lifestyle limiting changes at this point in time.   Noninvasive studies do not suggest clinically significant change.   No invasive studies, angiography or surgery at this time The patient should continue walking and begin a more formal exercise program.  The patient should continue antiplatelet therapy and aggressive treatment of the lipid abnormalities   No changes in the patient's medications at this time   Continued surveillance is indicated as atherosclerosis is likely to progress with time.     The patient will continue follow up with noninvasive studies as ordered.  The patient follow-up in 6 months or sooner if issues arise.           Current Outpatient Medications on File Prior to Visit  Medication Sig Dispense Refill   acetaminophen (TYLENOL) 325 MG tablet Take 1-2 tablets (325-650 mg total) by mouth every 4 (four) hours as needed for mild pain.       amLODipine (NORVASC) 10 MG tablet Take 1 tablet (10 mg total) by mouth daily. 30 tablet 0   apixaban (ELIQUIS) 5 MG TABS tablet Take 1 tablet (5 mg total) by mouth 2 (two) times daily. 60 tablet 1   gabapentin (NEURONTIN) 600 MG tablet Take 1 tablet (600 mg total) by mouth 3 (three) times daily. 90 tablet 0   methocarbamol (ROBAXIN) 500 MG tablet Take 1 tablet (500 mg total) by mouth every 6 (six) hours as needed for muscle spasms. 60 tablet 0   mupirocin ointment (BACTROBAN) 2 % Apply 1 Application topically  daily. 30 g 2   nicotine (NICODERM CQ - DOSED IN MG/24 HOURS) 21 mg/24hr patch Place 1 patch (21 mg total) onto the skin daily. 28 patch 0   oxyCODONE (OXY IR/ROXICODONE) 5 MG immediate release tablet Take 1 tablet (5 mg total) by mouth every 6 (six) hours as needed for severe pain. 28 tablet 0   potassium chloride SA (KLOR-CON M) 20 MEQ tablet Take 1 tablet (20 mEq total) by mouth daily. 30 tablet 0   traZODone (DESYREL) 50 MG tablet Take 0.5-1 tablets (25-50 mg total) by mouth at bedtime as needed for sleep. 10 tablet 0   Ferrous Sulfate (IRON) 325 (65 Fe) MG TABS Take 1 tablet (325 mg total) by mouth daily. 60 tablet 0   hydrochlorothiazide (HYDRODIURIL) 25 MG tablet Take 1 tablet (25 mg total) by mouth daily. 30 tablet 0    No current facility-administered medications on file prior to visit.      There are no Patient Instructions on file for this visit. No follow-ups on file.     Georgiana Spinner, NP PAIN:  Are you having pain? No  PRECAUTIONS: Fall  WEIGHT BEARING  RESTRICTIONS: No  FALLS:  Has patient fallen in last 6 months? 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  Pt. Has applied for disability (pending).    PLOF: Independent  PATIENT GOALS: Return to walking with less assistance  NEXT MD VISIT: PRN  OBJECTIVE:   PATIENT SURVEYS:  FOTO initial 46/ goal 67  COGNITION: Overall cognitive status: Within functional limits for tasks assessed     SENSATION: WFL  EDEMA:  Circumferential: 2" superior to template: R 47 cm/ L 51.5 cm.  Recommended shrinker use  MUSCLE LENGTH: Hamstrings: Right WNL; Left WNL  POSTURE: rounded shoulders, esp. When ambulating with RW  PALPATION: No tenderness/ good incision healing.    LOWER EXTREMITY ROM:  Good L LE AROM (all planes).  Prone L hip  extension 25 deg.  B UE AROM WNL  LOWER EXTREMITY MMT:  B LE strength grossly 5/5 MMT except L hip adduction/ extension 4/5 MMT.  B UE strength 5/5 MMT.   FUNCTIONAL TESTS:  5 times sit to stand: TBD 6 minute walk test: TBD  GAIT: Distance walked: In clinic/ //-bars Assistive device utilized: Walker - 2 wheeled Level of assistance: CGA Comments: pt. Ambulates in //-bars with cuing to increase a more consistent BOS/ recip. Step pattern with L heel strike to enforce L knee control/ wt. Bearing.  Pt. Ambulates at home without prosthetic leg and use of RW household distances.    Pt. States she has had a difficult time with living situation/ moving a couple times since last PT appt.  Pt. Currently living with grandma's house with daughter/ granddaughter.  Pts. Currently residence has 2 steps to enter and using RW due to no handrails.  Pt. Unable to drive at this time.  Pt. Reports 7+ falls over past month due to L knee mechanism buckling when walking fast/ not focused on wt. Bearing.  Pt. arrived to PT with prosthetic donned and no complaints of pain or discomfort.  Pt. Stated she is happy with the way her prosthetic is currently fitting.  Pt. Is able to complete all of her personal hygiene tasks independently with use of RW 50% of the time and without any UE assistance 50% of the time.  Pt. Became labile at start of PT tx. Session when describing current situation/ not being able to work.  Pt. Taking sleeping meds per MD to improve rest at night.  Pt. Returns to [facility] on 4/24.   TODAY'S TREATMENT:                                                                                                                              DATE: 02/25/2023  Subjective: Patient reports a couple falls since last session over past month.  Pt.   Pt. Has been assisting  dad at home with transfers and states she is challenged.  Pts. Dad has returned to hospital due to medical issues on 8/14.    There Ex Warm up:  No  charge  NuStep L5 10 min. B UE/LE. Seat 9, arms 8. No reports of pain.   Neuro.mm.:  Ambulate in //-bars with Airex step ups/overs (R on Airex)- 3 laps.  No AD.    Walking in //-bars with 6" hurdles focusing on L hip flexion/ step length and heel strike to promote L LE wt. Bearing/ stance phase during R swing through phase of gait.  4 laps.    Stair climbing 4 steps x 2- ascending/ descending stairs with use of SPC on R and light L UE assist on handrail.   Good knee mechanism control.    Lateral/fwd walking in //-bars with no UE assist (3 laps each direction).    NBOS/ tandem stance with EO and EC (CGA for safety/ cuing).  Airex: NBOS/ tandem stance with EO and EC.  Good balance/ L LE wt. Bearing.    Walking outside on ramps/ grassy terrain with use of SPC working on balance/ consistent step pattern.    PATIENT EDUCATION:  Education details: Access Code: [email or web address] Person educated: Patient Education method: Explanation, Demonstration, and Handouts Education comprehension: verbalized understanding and returned demonstration  HOME EXERCISE PROGRAM: Access Code: [email or web address] URL: [email or web address] Date: 07/20/2022 Prepared by: Dorene Grebe  Exercises - Side Stepping with Counter Support  - 1 x daily - 7 x weekly - 2 sets - 10 reps - Standing Hip Extension with Counter Support  - 1 x daily - 7 x weekly - 2 sets - 10 reps - Supine Single Leg Bridge with Sound Leg (AKA)  - 1 x daily - 7 x weekly - 2 sets - 10 reps - Prone Hip Extension with Residual Limb (AKA)  - 1 x daily - 7 x weekly - 2 sets - 10 reps - Sidelying Hip Abduction with Flexion and Extension (AKA)  - 1 x daily - 7 x weekly - 2 sets - 10 reps  ASSESSMENT:  CLINICAL IMPRESSION:   Pt. Has been limited by "pinching" in groin/ hip with prosthetic leg and is currently being fit by Judy Pimple for new prosthetic leg.  Pt. continues to utilize [facility] for gait with improving independence, esp. With outside/ uneven  terrain. Patient's ultimate goal is to ambulate with no AD. Session focused on ambulation with no AD fwd/lateral as well as stair training.  Good static balance in //-bars with addition of Airex pad.  Pt. will benefit from continued skilled PT services once new prosthetic leg is fit to increase LE strength to improve independence and safety with ambulation.      OBJECTIVE IMPAIRMENTS: Abnormal gait, decreased activity tolerance, decreased balance, decreased endurance, decreased mobility, difficulty walking, decreased ROM, decreased strength, improper body mechanics, postural dysfunction, and pain.   ACTIVITY LIMITATIONS: carrying, lifting, bending, squatting, stairs, transfers, toileting, dressing, and locomotion level  PARTICIPATION LIMITATIONS: cleaning, laundry, driving, shopping, and community activity  PERSONAL FACTORS: Age, Education, and Past/current experiences are also affecting patient's functional outcome.   REHAB POTENTIAL: Good  CLINICAL DECISION MAKING: Evolving/moderate complexity  EVALUATION COMPLEXITY: Moderate   GOALS: Goals reviewed with patient? Yes  LONG TERM GOALS: Target date: 04/22/23  Pt. Will increase FOTO to 57 to improve functional mobility.   Baseline:  initial 46.  4/11: 49.  6/13: 60  Goal status: Goal met  2.  Pt. Able to ambulate  community distances with use of prosthetic leg/ mod. I with least assistive device to improve functional mobility.   Baseline:  CGA for short distances in //-bars.  4/11: amb. With mod. I and use of RW but numerous falls. 6/13: more control, slower gait pattern.  8/16: mod. I with use of SPC.   Goal status: Goal met  3.  Pt. Able to ascend/ descend 2 flights of stairs with mod. I and use of single handrail safely.   Baseline: TBD.  4/11: pt. Requires use of B UE assist and cuing to slow down.  6/13: mod. Independent/ fearful Goal status: Partially met  4.  Pt. Will report no falls with household ambulation consistently for  a month to improve safety/ mod. Independence at home.    Baseline: 7 falls in past month.  5/16: 1 fall since returning to PT.  6/13: no recent falls over past month.  8/16: pt. Has recent fall in home while walking to bathroom.    Goal status: Partially met   PLAN:  PT FREQUENCY: Biweekly  PT DURATION: 8 weeks  PLANNED INTERVENTIONS: Therapeutic exercises, Therapeutic activity, Neuromuscular re-education, Balance training, Gait training, Patient/Family education, Self Care, Joint mobilization, Stair training, Prosthetic training, scar mobilization, Manual therapy, and Re-evaluation  PLAN FOR NEXT SESSION:  reassess new prosthetic leg.                 Cammie Mcgee, PT, DPT # [phone] Physical Therapist - [facility]  02/25/2023, 10:26 AM

## 2023-03-17 ENCOUNTER — Other Ambulatory Visit (INDEPENDENT_AMBULATORY_CARE_PROVIDER_SITE_OTHER): Payer: Self-pay | Admitting: Nurse Practitioner

## 2023-03-17 ENCOUNTER — Telehealth (INDEPENDENT_AMBULATORY_CARE_PROVIDER_SITE_OTHER): Payer: Self-pay

## 2023-03-17 DIAGNOSIS — Z89612 Acquired absence of left leg above knee: Secondary | ICD-10-CM

## 2023-03-17 NOTE — Telephone Encounter (Signed)
Patient called requesting a referral back to Physical Therapy   Please advise

## 2023-03-17 NOTE — Telephone Encounter (Signed)
Referral placed.

## 2023-03-18 ENCOUNTER — Encounter (INDEPENDENT_AMBULATORY_CARE_PROVIDER_SITE_OTHER): Payer: Self-pay

## 2023-03-18 NOTE — Telephone Encounter (Signed)
Mychart Message sent.

## 2023-03-24 ENCOUNTER — Ambulatory Visit: Payer: Medicaid Other | Attending: Nurse Practitioner | Admitting: Physical Therapy

## 2023-03-24 DIAGNOSIS — R269 Unspecified abnormalities of gait and mobility: Secondary | ICD-10-CM | POA: Diagnosis present

## 2023-03-24 DIAGNOSIS — Z89612 Acquired absence of left leg above knee: Secondary | ICD-10-CM | POA: Diagnosis present

## 2023-03-24 DIAGNOSIS — M6281 Muscle weakness (generalized): Secondary | ICD-10-CM | POA: Insufficient documentation

## 2023-03-25 ENCOUNTER — Encounter: Payer: Self-pay | Admitting: Physical Therapy

## 2023-03-25 NOTE — Therapy (Signed)
OUTPATIENT PHYSICAL THERAPY LOWER EXTREMITY TREATMENT  Patient Name: Jocelyn [name] MRN: [id number] DOB:1980/10/09, 42 y.o., female Today's Date: 03/25/2023  END OF SESSION:  PT End of Session - 03/25/23 1006     Visit Number 15    Number of Visits 22    Date for PT Re-Evaluation 04/22/23    PT Start Time 1107    PT Stop Time 1158    PT Time Calculation (min) 51 min    Equipment Utilized During Treatment Gait belt    Activity Tolerance Patient tolerated treatment well    Behavior During Therapy WFL for tasks assessed/performed              Past Medical History:  Diagnosis Date   IDA (iron deficiency anemia) 12/17/2019   Past Surgical History:  Procedure Laterality Date   AMPUTATION Left 01/20/2022   Procedure: AMPUTATION ABOVE KNEE;  Surgeon: Annice Needy, MD;  Location: [facility];  Service: Vascular;  Laterality: Left;   APPENDECTOMY     LOWER EXTREMITY ANGIOGRAPHY Left 01/11/2020   Procedure: Lower Extremity Angiography;  Surgeon: Annice Needy, MD;  Location: [facility];  Service: Cardiovascular;  Laterality: Left;   LOWER EXTREMITY ANGIOGRAPHY Left 03/06/2020   Procedure: Lower Extremity Angiography;  Surgeon: Annice Needy, MD;  Location: [facility];  Service: Cardiovascular;  Laterality: Left;   LOWER EXTREMITY ANGIOGRAPHY Left 03/07/2020   Procedure: Lower Extremity Angiography;  Surgeon: Annice Needy, MD;  Location: [facility];  Service: Cardiovascular;  Laterality: Left;   LOWER EXTREMITY ANGIOGRAPHY Left 01/06/2022   Procedure: Lower Extremity Angiography;  Surgeon: Annice Needy, MD;  Location: [facility];  Service: Cardiovascular;  Laterality: Left;   LOWER EXTREMITY ANGIOGRAPHY Left 01/07/2022   Procedure: Lower Extremity Angiography;  Surgeon: Annice Needy, MD;  Location: [facility];  Service: Cardiovascular;  Laterality: Left;   LOWER EXTREMITY ANGIOGRAPHY Left 01/18/2022   Procedure: Lower Extremity Angiography;   Surgeon: Annice Needy, MD;  Location: [facility];  Service: Cardiovascular;  Laterality: Left;   LOWER EXTREMITY INTERVENTION Bilateral 01/17/2022   Procedure: LOWER EXTREMITY INTERVENTION;  Surgeon: Learta Codding, MD;  Location: [facility];  Service: Cardiovascular;  Laterality: Bilateral;   PERIPHERAL VASCULAR THROMBECTOMY Left 01/20/2022   Procedure: PERIPHERAL VASCULAR THROMBECTOMY Left Femoral vein;  Surgeon: Annice Needy, MD;  Location: [facility];  Service: Vascular;  Laterality: Left;   Patient Active Problem List   Diagnosis Date Noted   S/P AKA (above knee amputation) unilateral, left (HCC) 01/25/2022   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   Acute urinary retention 01/19/2022   Fever 01/18/2022   Acute deep vein thrombosis (DVT) of left femoral vein (HCC) 01/17/2022   Hypomagnesemia 01/06/2022   Hyponatremia 01/06/2022   Critical limb ischemia of left lower extremity (HCC) 01/10/2020   Diabetes mellitus without complication (HCC) 12/17/2019   IDA (iron deficiency anemia) 12/17/2019   HTN (hypertension) 12/17/2019    PCP: [facility]  REFERRING PROVIDER: Georgiana Spinner, NP  REFERRING DIAG: S/P AKA (above knee amputation) unilateral, left (HCC)  THERAPY DIAG:  S/P AKA (above knee amputation) unilateral, left (HCC)  Muscle weakness (generalized)  Gait difficulty  Rationale for Evaluation and Treatment: Rehabilitation  ONSET DATE: 01/2023  SUBJECTIVE:   SUBJECTIVE STATEMENT: Pt. Reports having increase blood clots after receiving Covid vaccine 3 years ago. Pt. Had multiple surgeries resulting in L AKA on 01/20/22.  Pt. Received  prosthetic leg 1 month ago from [employer].  Pt. Was delayed with receiving leg secondary to insurance issues.  Pt. Has not walked with prosthetic leg at this time.  No c/o pain but phantom limb symptoms.  Pt. Has not been compliant with donning shrinker or HEP.    PERTINENT HISTORY: 1. S/P AKA (above knee  amputation) unilateral, left (HCC) The patient will continue with physical therapy.  Patient will follow-up if there are issues with her residual limb. 2. Primary hypertension Continue antihypertensive medications as already ordered, these medications have been reviewed and there are no changes at this time.    3. Peripheral arterial disease with history of revascularization (HCC)  Recommend:   The patient has evidence of atherosclerosis of the lower extremities with claudication.  The patient does not voice lifestyle limiting changes at this point in time.   Noninvasive studies do not suggest clinically significant change.   No invasive studies, angiography or surgery at this time The patient should continue walking and begin a more formal exercise program.  The patient should continue antiplatelet therapy and aggressive treatment of the lipid abnormalities   No changes in the patient's medications at this time   Continued surveillance is indicated as atherosclerosis is likely to progress with time.     The patient will continue follow up with noninvasive studies as ordered.  The patient follow-up in 6 months or sooner if issues arise.           Current Outpatient Medications on File Prior to Visit  Medication Sig Dispense Refill   acetaminophen (TYLENOL) 325 MG tablet Take 1-2 tablets (325-650 mg total) by mouth every 4 (four) hours as needed for mild pain.       amLODipine (NORVASC) 10 MG tablet Take 1 tablet (10 mg total) by mouth daily. 30 tablet 0   apixaban (ELIQUIS) 5 MG TABS tablet Take 1 tablet (5 mg total) by mouth 2 (two) times daily. 60 tablet 1   gabapentin (NEURONTIN) 600 MG tablet Take 1 tablet (600 mg total) by mouth 3 (three) times daily. 90 tablet 0   methocarbamol (ROBAXIN) 500 MG tablet Take 1 tablet (500 mg total) by mouth every 6 (six) hours as needed for muscle spasms. 60 tablet 0   mupirocin ointment (BACTROBAN) 2 % Apply 1 Application topically daily. 30 g 2    nicotine (NICODERM CQ - DOSED IN MG/24 HOURS) 21 mg/24hr patch Place 1 patch (21 mg total) onto the skin daily. 28 patch 0   oxyCODONE (OXY IR/ROXICODONE) 5 MG immediate release tablet Take 1 tablet (5 mg total) by mouth every 6 (six) hours as needed for severe pain. 28 tablet 0   potassium chloride SA (KLOR-CON M) 20 MEQ tablet Take 1 tablet (20 mEq total) by mouth daily. 30 tablet 0   traZODone (DESYREL) 50 MG tablet Take 0.5-1 tablets (25-50 mg total) by mouth at bedtime as needed for sleep. 10 tablet 0   Ferrous Sulfate (IRON) 325 (65 Fe) MG TABS Take 1 tablet (325 mg total) by mouth daily. 60 tablet 0   hydrochlorothiazide (HYDRODIURIL) 25 MG tablet Take 1 tablet (25 mg total) by mouth daily. 30 tablet 0    No current facility-administered medications on file prior to visit.      There are no Patient Instructions on file for this visit. No follow-ups on file.     Georgiana Spinner, NP PAIN:  Are you having pain? No  PRECAUTIONS: Fall  WEIGHT BEARING RESTRICTIONS:  No  FALLS:  Has patient fallen in last 6 months? 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  Pt. Has applied for disability (pending).    PLOF: Independent  PATIENT GOALS: Return to walking with less assistance  NEXT MD VISIT: PRN  OBJECTIVE:   PATIENT SURVEYS:  FOTO initial 46/ goal 12  COGNITION: Overall cognitive status: Within functional limits for tasks assessed     SENSATION: WFL  EDEMA:  Circumferential: 2" superior to template: R 47 cm/ L 51.5 cm.  Recommended shrinker use  MUSCLE LENGTH: Hamstrings: Right WNL; Left WNL  POSTURE: rounded shoulders, esp. When ambulating with RW  PALPATION: No tenderness/ good incision healing.    LOWER EXTREMITY ROM:  Good L LE AROM (all planes).  Prone L hip extension 25  deg.  B UE AROM WNL  LOWER EXTREMITY MMT:  B LE strength grossly 5/5 MMT except L hip adduction/ extension 4/5 MMT.  B UE strength 5/5 MMT.   FUNCTIONAL TESTS:  5 times sit to stand: TBD 6 minute walk test: TBD  GAIT: Distance walked: In clinic/ //-bars Assistive device utilized: Walker - 2 wheeled Level of assistance: CGA Comments: pt. Ambulates in //-bars with cuing to increase a more consistent BOS/ recip. Step pattern with L heel strike to enforce L knee control/ wt. Bearing.  Pt. Ambulates at home without prosthetic leg and use of RW household distances.     TODAY'S TREATMENT:                                                                                                                              DATE: 03/25/2023  Subjective: Pt. Received new prosthetic leg and pt. Is happy with tighter fit/ suction.  No c/o pain at this time.  Pt. Has been busy with caring for father and watching granddaughter during the day.  Pt. Is hoping to get a CNA to assist with dad during the day.    Reassessment of prosthetic fit/ leg length in standing.    Neuro.mm.:  Ambulate in //-bars (forward/backwards/lateral)- 3 laps each with focus on consistent step pattern/ length.  Good prosthetic knee control.  Added 6" hurdles step overs in //-bars with 1 UE assist progressing to no UE assist.  Pt. Cued to prevent hip circumduction/ focus on hip flexion.    Stair climbing 4 steps x 2- ascending/ descending stairs with use of SPC on R and light L UE assist on handrail.   Good knee mechanism control.    Standing weight shifting in //-bars with no UE assist.    Walking outside on parking lot/ sidewalk with SBA/CGA for safety/ cuing with no assistive device.  Walking up/down ramps and on grassy terrain with use of SPC working on balance/ consistent step pattern.  Pt.  Stepped up on curb into grass at end of tx. And while putting wt. On L prosthetic leg knee buckled and PT provided max A to prevent falling.  Pt.  Returned to upright posture and walked through grass to car.      PATIENT EDUCATION:  Education details: Access Code: [email or web address] Person educated: Patient Education method: Explanation, Demonstration, and Handouts Education comprehension: verbalized understanding and returned demonstration  HOME EXERCISE PROGRAM: Access Code: [email or web address] URL: [email or web address] Date: 07/20/2022 Prepared by: Dorene Grebe  Exercises - Side Stepping with Counter Support  - 1 x daily - 7 x weekly - 2 sets - 10 reps - Standing Hip Extension with Counter Support  - 1 x daily - 7 x weekly - 2 sets - 10 reps - Supine Single Leg Bridge with Sound Leg (AKA)  - 1 x daily - 7 x weekly - 2 sets - 10 reps - Prone Hip Extension with Residual Limb (AKA)  - 1 x daily - 7 x weekly - 2 sets - 10 reps - Sidelying Hip Abduction with Flexion and Extension (AKA)  - 1 x daily - 7 x weekly - 2 sets - 10 reps  ASSESSMENT:  CLINICAL IMPRESSION:   Pt. continues to utilize [facility] for gait with improving independence, esp. With outside/ uneven terrain. Patient's ultimate goal is to ambulate with no AD. Session focused on ambulation with no AD fwd/lateral as well as stair training.  Good static balance in //-bars and pt. Benefits from fit of new prosthetic leg. Pt. Had a near fall outside while stepping up on curb into grass and L knee buckled requiring max. A for safety.  Pt. will benefit from continued skilled PT services once new prosthetic leg is fit to increase LE strength to improve independence and safety with ambulation.      OBJECTIVE IMPAIRMENTS: Abnormal gait, decreased activity tolerance, decreased balance, decreased endurance, decreased mobility, difficulty walking, decreased ROM, decreased strength, improper body mechanics, postural dysfunction, and pain.   ACTIVITY LIMITATIONS: carrying, lifting, bending, squatting, stairs, transfers, toileting, dressing, and locomotion level  PARTICIPATION LIMITATIONS:  cleaning, laundry, driving, shopping, and community activity  PERSONAL FACTORS: Age, Education, and Past/current experiences are also affecting patient's functional outcome.   REHAB POTENTIAL: Good  CLINICAL DECISION MAKING: Evolving/moderate complexity  EVALUATION COMPLEXITY: Moderate   GOALS: Goals reviewed with patient? Yes  LONG TERM GOALS: Target date: 04/22/23  Pt. Will increase FOTO to 57 to improve functional mobility.   Baseline:  initial 46.  4/11: 49.  6/13: 60  Goal status: Goal met  2.  Pt. Able to ambulate community distances with use of prosthetic leg/ mod. I with least assistive device to improve functional mobility.   Baseline:  CGA for short distances in //-bars.  4/11: amb. With mod. I and use of RW but numerous falls. 6/13: more control, slower gait pattern.  8/16: mod. I with use of SPC.   Goal status: Goal met  3.  Pt. Able to ascend/ descend 2 flights of stairs with mod. I and use of single handrail safely.   Baseline: TBD.  4/11: pt. Requires use of B UE assist and cuing to slow down.  6/13: mod. Independent/ fearful Goal status: Partially met  4.  Pt. Will report no falls with household ambulation consistently for a month to improve safety/ mod. Independence at home.    Baseline: 7 falls in past month.  5/16: 1 fall since returning to PT.  6/13: no recent falls over past month.  8/16: pt. Has recent fall in home while walking to bathroom.    Goal status: Partially met   PLAN:  PT FREQUENCY: 1x/week to biweekly  PT DURATION: 8 weeks  PLANNED INTERVENTIONS: Therapeutic exercises, Therapeutic activity, Neuromuscular re-education, Balance training, Gait training, Patient/Family education, Self Care, Joint mobilization, Stair training, Prosthetic training, scar mobilization, Manual therapy, and Re-evaluation  PLAN FOR NEXT SESSION:  Curb training/ walking on grassy terrain                Cammie Mcgee, PT, DPT # [id number] Physical Therapist - [facility]  03/25/2023, 10:08 AM

## 2023-04-01 ENCOUNTER — Ambulatory Visit: Payer: Medicaid Other | Admitting: Physical Therapy

## 2023-04-01 DIAGNOSIS — R269 Unspecified abnormalities of gait and mobility: Secondary | ICD-10-CM

## 2023-04-01 DIAGNOSIS — Z89612 Acquired absence of left leg above knee: Secondary | ICD-10-CM | POA: Diagnosis not present

## 2023-04-01 DIAGNOSIS — M6281 Muscle weakness (generalized): Secondary | ICD-10-CM

## 2023-04-02 NOTE — Therapy (Signed)
OUTPATIENT PHYSICAL THERAPY LOWER EXTREMITY TREATMENT  Patient Name: Jocelyn [name] MRN: [id number] DOB:August 11, 1980, 42 y.o., female Today's Date: 04/02/2023  END OF SESSION:  PT End of Session - 04/02/23 1600     Visit Number 16    Number of Visits 22    Date for PT Re-Evaluation 04/22/23    PT Start Time 0858    PT Stop Time 0947    PT Time Calculation (min) 49 min    Equipment Utilized During Treatment Gait belt    Activity Tolerance Patient tolerated treatment well    Behavior During Therapy WFL for tasks assessed/performed              Past Medical History:  Diagnosis Date   IDA (iron deficiency anemia) 12/17/2019   Past Surgical History:  Procedure Laterality Date   AMPUTATION Left 01/20/2022   Procedure: AMPUTATION ABOVE KNEE;  Surgeon: Annice Needy, MD;  Location: [facility];  Service: Vascular;  Laterality: Left;   APPENDECTOMY     LOWER EXTREMITY ANGIOGRAPHY Left 01/11/2020   Procedure: Lower Extremity Angiography;  Surgeon: Annice Needy, MD;  Location: [facility];  Service: Cardiovascular;  Laterality: Left;   LOWER EXTREMITY ANGIOGRAPHY Left 03/06/2020   Procedure: Lower Extremity Angiography;  Surgeon: Annice Needy, MD;  Location: [facility];  Service: Cardiovascular;  Laterality: Left;   LOWER EXTREMITY ANGIOGRAPHY Left 03/07/2020   Procedure: Lower Extremity Angiography;  Surgeon: Annice Needy, MD;  Location: [facility];  Service: Cardiovascular;  Laterality: Left;   LOWER EXTREMITY ANGIOGRAPHY Left 01/06/2022   Procedure: Lower Extremity Angiography;  Surgeon: Annice Needy, MD;  Location: [facility];  Service: Cardiovascular;  Laterality: Left;   LOWER EXTREMITY ANGIOGRAPHY Left 01/07/2022   Procedure: Lower Extremity Angiography;  Surgeon: Annice Needy, MD;  Location: [facility];  Service: Cardiovascular;  Laterality: Left;   LOWER EXTREMITY ANGIOGRAPHY Left 01/18/2022   Procedure: Lower Extremity Angiography;   Surgeon: Annice Needy, MD;  Location: [facility];  Service: Cardiovascular;  Laterality: Left;   LOWER EXTREMITY INTERVENTION Bilateral 01/17/2022   Procedure: LOWER EXTREMITY INTERVENTION;  Surgeon: Learta Codding, MD;  Location: [facility];  Service: Cardiovascular;  Laterality: Bilateral;   PERIPHERAL VASCULAR THROMBECTOMY Left 01/20/2022   Procedure: PERIPHERAL VASCULAR THROMBECTOMY Left Femoral vein;  Surgeon: Annice Needy, MD;  Location: [facility];  Service: Vascular;  Laterality: Left;   Patient Active Problem List   Diagnosis Date Noted   S/P AKA (above knee amputation) unilateral, left (HCC) 01/25/2022   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   Acute urinary retention 01/19/2022   Fever 01/18/2022   Acute deep vein thrombosis (DVT) of left femoral vein (HCC) 01/17/2022   Hypomagnesemia 01/06/2022   Hyponatremia 01/06/2022   Critical limb ischemia of left lower extremity (HCC) 01/10/2020   Diabetes mellitus without complication (HCC) 12/17/2019   IDA (iron deficiency anemia) 12/17/2019   HTN (hypertension) 12/17/2019    PCP: [facility]  REFERRING PROVIDER: Georgiana Spinner, NP  REFERRING DIAG: S/P AKA (above knee amputation) unilateral, left (HCC)  THERAPY DIAG:  S/P AKA (above knee amputation) unilateral, left (HCC)  Muscle weakness (generalized)  Gait difficulty  Rationale for Evaluation and Treatment: Rehabilitation  ONSET DATE: 01/2023  SUBJECTIVE:   SUBJECTIVE STATEMENT: Pt. Reports having increase blood clots after receiving Covid vaccine 3 years ago. Pt. Had multiple surgeries resulting in L AKA on 01/20/22.  Pt. Received  prosthetic leg 1 month ago from [facility].  Pt. Was delayed with receiving leg secondary to insurance issues.  Pt. Has not walked with prosthetic leg at this time.  No c/o pain but phantom limb symptoms.  Pt. Has not been compliant with donning shrinker or HEP.    PERTINENT HISTORY: 1. S/P AKA (above knee  amputation) unilateral, left (HCC) The patient will continue with physical therapy.  Patient will follow-up if there are issues with her residual limb. 2. Primary hypertension Continue antihypertensive medications as already ordered, these medications have been reviewed and there are no changes at this time.    3. Peripheral arterial disease with history of revascularization (HCC)  Recommend:   The patient has evidence of atherosclerosis of the lower extremities with claudication.  The patient does not voice lifestyle limiting changes at this point in time.   Noninvasive studies do not suggest clinically significant change.   No invasive studies, angiography or surgery at this time The patient should continue walking and begin a more formal exercise program.  The patient should continue antiplatelet therapy and aggressive treatment of the lipid abnormalities   No changes in the patient's medications at this time   Continued surveillance is indicated as atherosclerosis is likely to progress with time.     The patient will continue follow up with noninvasive studies as ordered.  The patient follow-up in 6 months or sooner if issues arise.           Current Outpatient Medications on File Prior to Visit  Medication Sig Dispense Refill   acetaminophen (TYLENOL) 325 MG tablet Take 1-2 tablets (325-650 mg total) by mouth every 4 (four) hours as needed for mild pain.       amLODipine (NORVASC) 10 MG tablet Take 1 tablet (10 mg total) by mouth daily. 30 tablet 0   apixaban (ELIQUIS) 5 MG TABS tablet Take 1 tablet (5 mg total) by mouth 2 (two) times daily. 60 tablet 1   gabapentin (NEURONTIN) 600 MG tablet Take 1 tablet (600 mg total) by mouth 3 (three) times daily. 90 tablet 0   methocarbamol (ROBAXIN) 500 MG tablet Take 1 tablet (500 mg total) by mouth every 6 (six) hours as needed for muscle spasms. 60 tablet 0   mupirocin ointment (BACTROBAN) 2 % Apply 1 Application topically daily. 30 g 2    nicotine (NICODERM CQ - DOSED IN MG/24 HOURS) 21 mg/24hr patch Place 1 patch (21 mg total) onto the skin daily. 28 patch 0   oxyCODONE (OXY IR/ROXICODONE) 5 MG immediate release tablet Take 1 tablet (5 mg total) by mouth every 6 (six) hours as needed for severe pain. 28 tablet 0   potassium chloride SA (KLOR-CON M) 20 MEQ tablet Take 1 tablet (20 mEq total) by mouth daily. 30 tablet 0   traZODone (DESYREL) 50 MG tablet Take 0.5-1 tablets (25-50 mg total) by mouth at bedtime as needed for sleep. 10 tablet 0   Ferrous Sulfate (IRON) 325 (65 Fe) MG TABS Take 1 tablet (325 mg total) by mouth daily. 60 tablet 0   hydrochlorothiazide (HYDRODIURIL) 25 MG tablet Take 1 tablet (25 mg total) by mouth daily. 30 tablet 0    No current facility-administered medications on file prior to visit.      There are no Patient Instructions on file for this visit. No follow-ups on file.     Georgiana Spinner, NP PAIN:  Are you having pain? No  PRECAUTIONS: Fall  WEIGHT BEARING RESTRICTIONS:  No  FALLS:  Has patient fallen in last 6 months? 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  Pt. Has applied for disability (pending).    PLOF: Independent  PATIENT GOALS: Return to walking with less assistance  NEXT MD VISIT: PRN  OBJECTIVE:   PATIENT SURVEYS:  FOTO initial 46/ goal 31  COGNITION: Overall cognitive status: Within functional limits for tasks assessed     SENSATION: WFL  EDEMA:  Circumferential: 2" superior to template: R 47 cm/ L 51.5 cm.  Recommended shrinker use  MUSCLE LENGTH: Hamstrings: Right WNL; Left WNL  POSTURE: rounded shoulders, esp. When ambulating with RW  PALPATION: No tenderness/ good incision healing.    LOWER EXTREMITY ROM:  Good L LE AROM (all planes).  Prone L hip extension 25  deg.  B UE AROM WNL  LOWER EXTREMITY MMT:  B LE strength grossly 5/5 MMT except L hip adduction/ extension 4/5 MMT.  B UE strength 5/5 MMT.   FUNCTIONAL TESTS:  5 times sit to stand: TBD 6 minute walk test: TBD  GAIT: Distance walked: In clinic/ //-bars Assistive device utilized: Walker - 2 wheeled Level of assistance: CGA Comments: pt. Ambulates in //-bars with cuing to increase a more consistent BOS/ recip. Step pattern with L heel strike to enforce L knee control/ wt. Bearing.  Pt. Ambulates at home without prosthetic leg and use of RW household distances.     TODAY'S TREATMENT:                                                                                                                              DATE: 04/02/2023  Subjective: Pt. Has been really active over past week taking care of dad during the day/ watching granddaughter. No falls reported.  Pt. Wearing prosthetic leg for the majority of the day.     Reassessment of prosthetic fit/ leg length in standing.    Neuro.mm.:  Ambulate in //-bars (forward/backwards/lateral)- 3 laps each with focus on consistent step pattern/ length.  Good prosthetic knee control.  Added Airex pads to challenged balance with B UE assist progressing to 1UE assist.  Pt. Cued to prevent hip circumduction/ focus on hip flexion.    Outside stair climbing ascending/ descending stairs with use of SPC on R and light L UE assist on handrail.   Good knee mechanism control.    Ambulating up side hill/ varying terrain with CGA and cuing to maintaining proper BOS/ step pattern/ heel strike.  1 LOB but pt. Able to self-correct independently.    Walking outside on parking lot/ sidewalk with SBA/CGA for safety/ cuing with [facility] and no assistive device.  Walking up/down ramps and on grassy terrain with use of SPC working on balance/ consistent step pattern.  Focused on ascending/ descending  curbs and grassy terrain.      PATIENT EDUCATION:  Education details:  Access Code: [email or web address] Person educated: Patient Education method: Explanation, Demonstration, and Handouts Education comprehension: verbalized understanding and returned demonstration  HOME EXERCISE PROGRAM: Access Code: [email or web address] URL: [email or web address] Date: 07/20/2022 Prepared by: Dorene Grebe  Exercises - Side Stepping with Counter Support  - 1 x daily - 7 x weekly - 2 sets - 10 reps - Standing Hip Extension with Counter Support  - 1 x daily - 7 x weekly - 2 sets - 10 reps - Supine Single Leg Bridge with Sound Leg (AKA)  - 1 x daily - 7 x weekly - 2 sets - 10 reps - Prone Hip Extension with Residual Limb (AKA)  - 1 x daily - 7 x weekly - 2 sets - 10 reps - Sidelying Hip Abduction with Flexion and Extension (AKA)  - 1 x daily - 7 x weekly - 2 sets - 10 reps  ASSESSMENT:  CLINICAL IMPRESSION:   Pt. continues to utilize [facility] for gait with improving independence, esp. With outside/ uneven terrain. Patient's ultimate goal is to ambulate with no AD. Session focused on ambulation with no AD fwd/lateral as well as stair training.  Good static balance in //-bars and pt. Benefits from fit of new prosthetic leg. Pt. Progressing well with higher dynamic balance on outside terrain.  Pt. Had 1 LOB during tx. But able to self-correct with use of SPC.  Pt. will benefit from continued skilled PT services once new prosthetic leg is fit to increase LE strength to improve independence and safety with ambulation.      OBJECTIVE IMPAIRMENTS: Abnormal gait, decreased activity tolerance, decreased balance, decreased endurance, decreased mobility, difficulty walking, decreased ROM, decreased strength, improper body mechanics, postural dysfunction, and pain.   ACTIVITY LIMITATIONS: carrying, lifting, bending, squatting, stairs, transfers, toileting, dressing, and locomotion level  PARTICIPATION LIMITATIONS: cleaning, laundry, driving, shopping, and community activity  PERSONAL FACTORS: Age,  Education, and Past/current experiences are also affecting patient's functional outcome.   REHAB POTENTIAL: Good  CLINICAL DECISION MAKING: Evolving/moderate complexity  EVALUATION COMPLEXITY: Moderate   GOALS: Goals reviewed with patient? Yes  LONG TERM GOALS: Target date: 04/22/23  Pt. Will increase FOTO to 57 to improve functional mobility.   Baseline:  initial 46.  4/11: 49.  6/13: 60  Goal status: Goal met  2.  Pt. Able to ambulate community distances with use of prosthetic leg/ mod. I with least assistive device to improve functional mobility.   Baseline:  CGA for short distances in //-bars.  4/11: amb. With mod. I and use of RW but numerous falls. 6/13: more control, slower gait pattern.  8/16: mod. I with use of SPC.   Goal status: Goal met  3.  Pt. Able to ascend/ descend 2 flights of stairs with mod. I and use of single handrail safely.   Baseline: TBD.  4/11: pt. Requires use of B UE assist and cuing to slow down.  6/13: mod. Independent/ fearful Goal status: Partially met  4.  Pt. Will report no falls with household ambulation consistently for a month to improve safety/ mod. Independence at home.    Baseline: 7 falls in past month.  5/16: 1 fall since returning to PT.  6/13: no recent falls over past month.  8/16: pt. Has recent fall in home while walking to bathroom.    Goal status: Partially met   PLAN:  PT FREQUENCY: 1x/week to biweekly  PT DURATION: 8 weeks  PLANNED INTERVENTIONS: Therapeutic exercises, Therapeutic activity, Neuromuscular re-education, Balance training, Gait training, Patient/Family education, Self Care, Joint mobilization, Stair training, Prosthetic training, scar mobilization, Manual therapy, and Re-evaluation  PLAN FOR NEXT SESSION:  Curb training/ walking on grassy terrain                Cammie Mcgee, PT, DPT # [phone] Physical Therapist - [facility]  04/02/2023, 4:01 PM

## 2023-04-14 ENCOUNTER — Ambulatory Visit: Payer: Medicaid Other | Attending: Family Medicine | Admitting: Physical Therapy

## 2023-04-14 DIAGNOSIS — Z89612 Acquired absence of left leg above knee: Secondary | ICD-10-CM | POA: Diagnosis present

## 2023-04-14 DIAGNOSIS — M6281 Muscle weakness (generalized): Secondary | ICD-10-CM | POA: Diagnosis present

## 2023-04-14 DIAGNOSIS — R269 Unspecified abnormalities of gait and mobility: Secondary | ICD-10-CM | POA: Insufficient documentation

## 2023-04-16 ENCOUNTER — Encounter: Payer: Self-pay | Admitting: Physical Therapy

## 2023-04-16 NOTE — Therapy (Signed)
OUTPATIENT PHYSICAL THERAPY LOWER EXTREMITY TREATMENT  Patient Name: Jocelyn [name] MRN: [id number] DOB:1980-10-10, 42 y.o., female Today's Date: 04/14/2023  END OF SESSION:  PT End of Session - 04/16/23 1402     Visit Number 17    Number of Visits 22    Date for PT Re-Evaluation 04/22/23    PT Start Time 1055    PT Stop Time 1156    PT Time Calculation (min) 61 min    Equipment Utilized During Treatment Gait belt    Activity Tolerance Patient tolerated treatment well    Behavior During Therapy WFL for tasks assessed/performed              Past Medical History:  Diagnosis Date   IDA (iron deficiency anemia) 12/17/2019   Past Surgical History:  Procedure Laterality Date   AMPUTATION Left 01/20/2022   Procedure: AMPUTATION ABOVE KNEE;  Surgeon: Annice Needy, MD;  Location: [facility];  Service: Vascular;  Laterality: Left;   APPENDECTOMY     LOWER EXTREMITY ANGIOGRAPHY Left 01/11/2020   Procedure: Lower Extremity Angiography;  Surgeon: Annice Needy, MD;  Location: [facility];  Service: Cardiovascular;  Laterality: Left;   LOWER EXTREMITY ANGIOGRAPHY Left 03/06/2020   Procedure: Lower Extremity Angiography;  Surgeon: Annice Needy, MD;  Location: [facility];  Service: Cardiovascular;  Laterality: Left;   LOWER EXTREMITY ANGIOGRAPHY Left 03/07/2020   Procedure: Lower Extremity Angiography;  Surgeon: Annice Needy, MD;  Location: [facility];  Service: Cardiovascular;  Laterality: Left;   LOWER EXTREMITY ANGIOGRAPHY Left 01/06/2022   Procedure: Lower Extremity Angiography;  Surgeon: Annice Needy, MD;  Location: [facility];  Service: Cardiovascular;  Laterality: Left;   LOWER EXTREMITY ANGIOGRAPHY Left 01/07/2022   Procedure: Lower Extremity Angiography;  Surgeon: Annice Needy, MD;  Location: [facility];  Service: Cardiovascular;  Laterality: Left;   LOWER EXTREMITY ANGIOGRAPHY Left 01/18/2022   Procedure: Lower Extremity Angiography;   Surgeon: Annice Needy, MD;  Location: [facility];  Service: Cardiovascular;  Laterality: Left;   LOWER EXTREMITY INTERVENTION Bilateral 01/17/2022   Procedure: LOWER EXTREMITY INTERVENTION;  Surgeon: Learta Codding, MD;  Location: [facility];  Service: Cardiovascular;  Laterality: Bilateral;   PERIPHERAL VASCULAR THROMBECTOMY Left 01/20/2022   Procedure: PERIPHERAL VASCULAR THROMBECTOMY Left Femoral vein;  Surgeon: Annice Needy, MD;  Location: [facility];  Service: Vascular;  Laterality: Left;   Patient Active Problem List   Diagnosis Date Noted   S/P AKA (above knee amputation) unilateral, left (HCC) 01/25/2022   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   Acute urinary retention 01/19/2022   Fever 01/18/2022   Acute deep vein thrombosis (DVT) of left femoral vein (HCC) 01/17/2022   Hypomagnesemia 01/06/2022   Hyponatremia 01/06/2022   Critical limb ischemia of left lower extremity (HCC) 01/10/2020   Diabetes mellitus without complication (HCC) 12/17/2019   IDA (iron deficiency anemia) 12/17/2019   HTN (hypertension) 12/17/2019    PCP: [facility]  REFERRING PROVIDER: Georgiana Spinner, NP  REFERRING DIAG: S/P AKA (above knee amputation) unilateral, left (HCC)  THERAPY DIAG:  S/P AKA (above knee amputation) unilateral, left (HCC)  Muscle weakness (generalized)  Gait difficulty  Rationale for Evaluation and Treatment: Rehabilitation  ONSET DATE: 01/2023  SUBJECTIVE:   SUBJECTIVE STATEMENT: Pt. Reports having increase blood clots after receiving Covid vaccine 3 years ago. Pt. Had multiple surgeries resulting in L AKA on 01/20/22.  Pt. Received  prosthetic leg 1 month ago from [facility].  Pt. Was delayed with receiving leg secondary to insurance issues.  Pt. Has not walked with prosthetic leg at this time.  No c/o pain but phantom limb symptoms.  Pt. Has not been compliant with donning shrinker or HEP.    PERTINENT HISTORY: 1. S/P AKA (above knee  amputation) unilateral, left (HCC) The patient will continue with physical therapy.  Patient will follow-up if there are issues with her residual limb. 2. Primary hypertension Continue antihypertensive medications as already ordered, these medications have been reviewed and there are no changes at this time.    3. Peripheral arterial disease with history of revascularization (HCC)  Recommend:   The patient has evidence of atherosclerosis of the lower extremities with claudication.  The patient does not voice lifestyle limiting changes at this point in time.   Noninvasive studies do not suggest clinically significant change.   No invasive studies, angiography or surgery at this time The patient should continue walking and begin a more formal exercise program.  The patient should continue antiplatelet therapy and aggressive treatment of the lipid abnormalities   No changes in the patient's medications at this time   Continued surveillance is indicated as atherosclerosis is likely to progress with time.     The patient will continue follow up with noninvasive studies as ordered.  The patient follow-up in 6 months or sooner if issues arise.           Current Outpatient Medications on File Prior to Visit  Medication Sig Dispense Refill   acetaminophen (TYLENOL) 325 MG tablet Take 1-2 tablets (325-650 mg total) by mouth every 4 (four) hours as needed for mild pain.       amLODipine (NORVASC) 10 MG tablet Take 1 tablet (10 mg total) by mouth daily. 30 tablet 0   apixaban (ELIQUIS) 5 MG TABS tablet Take 1 tablet (5 mg total) by mouth 2 (two) times daily. 60 tablet 1   gabapentin (NEURONTIN) 600 MG tablet Take 1 tablet (600 mg total) by mouth 3 (three) times daily. 90 tablet 0   methocarbamol (ROBAXIN) 500 MG tablet Take 1 tablet (500 mg total) by mouth every 6 (six) hours as needed for muscle spasms. 60 tablet 0   mupirocin ointment (BACTROBAN) 2 % Apply 1 Application topically daily. 30 g 2    nicotine (NICODERM CQ - DOSED IN MG/24 HOURS) 21 mg/24hr patch Place 1 patch (21 mg total) onto the skin daily. 28 patch 0   oxyCODONE (OXY IR/ROXICODONE) 5 MG immediate release tablet Take 1 tablet (5 mg total) by mouth every 6 (six) hours as needed for severe pain. 28 tablet 0   potassium chloride SA (KLOR-CON M) 20 MEQ tablet Take 1 tablet (20 mEq total) by mouth daily. 30 tablet 0   traZODone (DESYREL) 50 MG tablet Take 0.5-1 tablets (25-50 mg total) by mouth at bedtime as needed for sleep. 10 tablet 0   Ferrous Sulfate (IRON) 325 (65 Fe) MG TABS Take 1 tablet (325 mg total) by mouth daily. 60 tablet 0   hydrochlorothiazide (HYDRODIURIL) 25 MG tablet Take 1 tablet (25 mg total) by mouth daily. 30 tablet 0    No current facility-administered medications on file prior to visit.      There are no Patient Instructions on file for this visit. No follow-ups on file.     Georgiana Spinner, NP PAIN:  Are you having pain? No  PRECAUTIONS: Fall  WEIGHT BEARING RESTRICTIONS:  No  FALLS:  Has patient fallen in last 6 months? 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  Pt. Has applied for disability (pending).    PLOF: Independent  PATIENT GOALS: Return to walking with less assistance  NEXT MD VISIT: PRN  OBJECTIVE:   PATIENT SURVEYS:  FOTO initial 46/ goal 20  COGNITION: Overall cognitive status: Within functional limits for tasks assessed     SENSATION: WFL  EDEMA:  Circumferential: 2" superior to template: R 47 cm/ L 51.5 cm.  Recommended shrinker use  MUSCLE LENGTH: Hamstrings: Right WNL; Left WNL  POSTURE: rounded shoulders, esp. When ambulating with RW  PALPATION: No tenderness/ good incision healing.    LOWER EXTREMITY ROM:  Good L LE AROM (all planes).  Prone L hip extension 25  deg.  B UE AROM WNL  LOWER EXTREMITY MMT:  B LE strength grossly 5/5 MMT except L hip adduction/ extension 4/5 MMT.  B UE strength 5/5 MMT.   FUNCTIONAL TESTS:  5 times sit to stand: TBD 6 minute walk test: TBD  GAIT: Distance walked: In clinic/ //-bars Assistive device utilized: Walker - 2 wheeled Level of assistance: CGA Comments: pt. Ambulates in //-bars with cuing to increase a more consistent BOS/ recip. Step pattern with L heel strike to enforce L knee control/ wt. Bearing.  Pt. Ambulates at home without prosthetic leg and use of RW household distances.     TODAY'S TREATMENT:                                                                                                                              DATE: 04/14/23  Subjective: Pt. Has been really active over past week taking care of dad during the day/ watching granddaughter. Pts. Dad now has a caregiver for part of the day which has allowed her to rest leg/ remove prosthetic leg.  Pt. Did have a fall in kitchen this morning.  No injury and able to return to stand independently.        Neuro.mm.:  Ambulate in //-bars (forward/backwards/lateral)- 3 laps each with focus on consistent step pattern/ length.  Good prosthetic knee control.  Added Airex pads to challenged balance with B UE assist progressing to 1UE assist.  Pt. Cued to prevent hip circumduction/ focus on hip flexion.    Outside stair climbing ascending/ descending stairs with use of SPC on R and light L UE assist on handrail.   Good knee mechanism control.    Ambulating up side hill/ varying terrain with CGA and cuing to maintaining proper BOS/ step pattern/ heel strike.  No LOB and did not use SPC.  Pt. Fearful of falling on hip but did well with CGA.    Walking outside on parking lot/ sidewalk with SBA/CGA for safety/  cuing with [facility] and no assistive device.  Walking up/down ramps and on grassy terrain with use of SPC working on balance/ consistent step pattern.  Focused  on ascending/ descending curbs and grassy terrain.      Standing functional reaching with cones (challenged with increase distance)  PATIENT EDUCATION:  Education details: Access Code: [email or web address] Person educated: Patient Education method: Explanation, Demonstration, and Handouts Education comprehension: verbalized understanding and returned demonstration  HOME EXERCISE PROGRAM: Access Code: [email or web address] URL: [email or web address] Date: 07/20/2022 Prepared by: Dorene Grebe  Exercises - Side Stepping with Counter Support  - 1 x daily - 7 x weekly - 2 sets - 10 reps - Standing Hip Extension with Counter Support  - 1 x daily - 7 x weekly - 2 sets - 10 reps - Supine Single Leg Bridge with Sound Leg (AKA)  - 1 x daily - 7 x weekly - 2 sets - 10 reps - Prone Hip Extension with Residual Limb (AKA)  - 1 x daily - 7 x weekly - 2 sets - 10 reps - Sidelying Hip Abduction with Flexion and Extension (AKA)  - 1 x daily - 7 x weekly - 2 sets - 10 reps  ASSESSMENT:  CLINICAL IMPRESSION:   Pt. continues to utilize [facility] for gait with improving independence, esp. With outside/ uneven terrain. Patient's ultimate goal is to ambulate with no AD.  Pt. Demonstrates ability to climb small hill at back of PT clinic without use of SPC but hesitant/ fearful of falling.  Session focused on ambulation with no AD fwd/lateral as well as stair training.  Good static balance in //-bars and pt. Benefits from fit of new prosthetic leg. Pt. Progressing well with higher dynamic balance on outside terrain.  Pt. Has 1 more PT tx. Session scheduled.  Pt. will benefit from continued skilled PT services once new prosthetic leg is fit to increase LE strength to improve independence and safety with ambulation.      OBJECTIVE IMPAIRMENTS: Abnormal gait, decreased activity tolerance, decreased balance, decreased endurance, decreased mobility, difficulty walking, decreased ROM, decreased strength, improper body mechanics,  postural dysfunction, and pain.   ACTIVITY LIMITATIONS: carrying, lifting, bending, squatting, stairs, transfers, toileting, dressing, and locomotion level  PARTICIPATION LIMITATIONS: cleaning, laundry, driving, shopping, and community activity  PERSONAL FACTORS: Age, Education, and Past/current experiences are also affecting patient's functional outcome.   REHAB POTENTIAL: Good  CLINICAL DECISION MAKING: Evolving/moderate complexity  EVALUATION COMPLEXITY: Moderate   GOALS: Goals reviewed with patient? Yes  LONG TERM GOALS: Target date: 04/22/23  Pt. Will increase FOTO to 57 to improve functional mobility.   Baseline:  initial 46.  4/11: 49.  6/13: 60  Goal status: Goal met  2.  Pt. Able to ambulate community distances with use of prosthetic leg/ mod. I with least assistive device to improve functional mobility.   Baseline:  CGA for short distances in //-bars.  4/11: amb. With mod. I and use of RW but numerous falls. 6/13: more control, slower gait pattern.  8/16: mod. I with use of SPC.   Goal status: Goal met  3.  Pt. Able to ascend/ descend 2 flights of stairs with mod. I and use of single handrail safely.   Baseline: TBD.  4/11: pt. Requires use of B UE assist and cuing to slow down.  6/13: mod. Independent/ fearful Goal status: Partially met  4.  Pt. Will report no falls with household ambulation consistently for a month to improve safety/ mod. Independence at home.  Baseline: 7 falls in past month.  5/16: 1 fall since returning to PT.  6/13: no recent falls over past month.  8/16: pt. Has recent fall in home while walking to bathroom.    Goal status: Partially met   PLAN:  PT FREQUENCY: 1x/week to biweekly  PT DURATION: 8 weeks  PLANNED INTERVENTIONS: Therapeutic exercises, Therapeutic activity, Neuromuscular re-education, Balance training, Gait training, Patient/Family education, Self Care, Joint mobilization, Stair training, Prosthetic training, scar  mobilization, Manual therapy, and Re-evaluation  PLAN FOR NEXT SESSION:  Curb training/ walking on grassy terrain.  Probable discharge next visit               Cammie Mcgee, PT, DPT # [phone] Physical Therapist - [facility]  04/16/2023, 2:03 PM

## 2023-04-21 ENCOUNTER — Ambulatory Visit: Payer: Medicaid Other

## 2023-04-21 DIAGNOSIS — Z89612 Acquired absence of left leg above knee: Secondary | ICD-10-CM

## 2023-04-21 DIAGNOSIS — R269 Unspecified abnormalities of gait and mobility: Secondary | ICD-10-CM

## 2023-04-21 DIAGNOSIS — M6281 Muscle weakness (generalized): Secondary | ICD-10-CM

## 2023-04-21 NOTE — Therapy (Signed)
OUTPATIENT PHYSICAL THERAPY LOWER EXTREMITY DISCHARGE  Patient Name: Jocelyn [name] TOELLE MRN: [id number] DOB:1981/05/05, 42 y.o., female Today's Date: 04/14/2023  END OF SESSION:   PT End of Session - 04/21/23 1559     Visit Number 18    Number of Visits 22    Date for PT Re-Evaluation 04/22/23    PT Start Time 1600    PT Stop Time 1625    PT Time Calculation (min) 25 min    Equipment Utilized During Treatment Gait belt    Activity Tolerance Patient tolerated treatment well    Behavior During Therapy WFL for tasks assessed/performed               Past Medical History:  Diagnosis Date   IDA (iron deficiency anemia) 12/17/2019   Past Surgical History:  Procedure Laterality Date   AMPUTATION Left 01/20/2022   Procedure: AMPUTATION ABOVE KNEE;  Surgeon: Annice Needy, MD;  Location: [facility];  Service: Vascular;  Laterality: Left;   APPENDECTOMY     LOWER EXTREMITY ANGIOGRAPHY Left 01/11/2020   Procedure: Lower Extremity Angiography;  Surgeon: Annice Needy, MD;  Location: [facility];  Service: Cardiovascular;  Laterality: Left;   LOWER EXTREMITY ANGIOGRAPHY Left 03/06/2020   Procedure: Lower Extremity Angiography;  Surgeon: Annice Needy, MD;  Location: [facility];  Service: Cardiovascular;  Laterality: Left;   LOWER EXTREMITY ANGIOGRAPHY Left 03/07/2020   Procedure: Lower Extremity Angiography;  Surgeon: Annice Needy, MD;  Location: [facility];  Service: Cardiovascular;  Laterality: Left;   LOWER EXTREMITY ANGIOGRAPHY Left 01/06/2022   Procedure: Lower Extremity Angiography;  Surgeon: Annice Needy, MD;  Location: [facility];  Service: Cardiovascular;  Laterality: Left;   LOWER EXTREMITY ANGIOGRAPHY Left 01/07/2022   Procedure: Lower Extremity Angiography;  Surgeon: Annice Needy, MD;  Location: [facility];  Service: Cardiovascular;  Laterality: Left;   LOWER EXTREMITY ANGIOGRAPHY Left 01/18/2022   Procedure: Lower Extremity Angiography;   Surgeon: Annice Needy, MD;  Location: [facility];  Service: Cardiovascular;  Laterality: Left;   LOWER EXTREMITY INTERVENTION Bilateral 01/17/2022   Procedure: LOWER EXTREMITY INTERVENTION;  Surgeon: Learta Codding, MD;  Location: [facility];  Service: Cardiovascular;  Laterality: Bilateral;   PERIPHERAL VASCULAR THROMBECTOMY Left 01/20/2022   Procedure: PERIPHERAL VASCULAR THROMBECTOMY Left Femoral vein;  Surgeon: Annice Needy, MD;  Location: [facility];  Service: Vascular;  Laterality: Left;   Patient Active Problem List   Diagnosis Date Noted   S/P AKA (above knee amputation) unilateral, left (HCC) 01/25/2022   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   Acute urinary retention 01/19/2022   Fever 01/18/2022   Acute deep vein thrombosis (DVT) of left femoral vein (HCC) 01/17/2022   Hypomagnesemia 01/06/2022   Hyponatremia 01/06/2022   Critical limb ischemia of left lower extremity (HCC) 01/10/2020   Diabetes mellitus without complication (HCC) 12/17/2019   IDA (iron deficiency anemia) 12/17/2019   HTN (hypertension) 12/17/2019    PCP: [facility]  REFERRING PROVIDER: Georgiana Spinner, NP  REFERRING DIAG: S/P AKA (above knee amputation) unilateral, left (HCC)  THERAPY DIAG:  S/P AKA (above knee amputation) unilateral, left (HCC)  Muscle weakness (generalized)  Gait difficulty  Rationale for Evaluation and Treatment: Rehabilitation  ONSET DATE: 01/2023  SUBJECTIVE:   SUBJECTIVE STATEMENT: Pt. Reports having increase blood clots after receiving Covid vaccine 3 years ago. Pt. Had multiple surgeries resulting in L AKA on 01/20/22.  Pt. Received prosthetic leg 1 month ago from [facility].  Pt. Was delayed with receiving leg secondary to insurance issues.  Pt. Has not walked with prosthetic leg at this time.  No c/o pain but phantom limb symptoms.  Pt. Has not been compliant with donning shrinker or HEP.    PERTINENT HISTORY: 1. S/P AKA (above knee  amputation) unilateral, left (HCC) The patient will continue with physical therapy.  Patient will follow-up if there are issues with her residual limb. 2. Primary hypertension Continue antihypertensive medications as already ordered, these medications have been reviewed and there are no changes at this time.    3. Peripheral arterial disease with history of revascularization (HCC)  Recommend:   The patient has evidence of atherosclerosis of the lower extremities with claudication.  The patient does not voice lifestyle limiting changes at this point in time.   Noninvasive studies do not suggest clinically significant change.   No invasive studies, angiography or surgery at this time The patient should continue walking and begin a more formal exercise program.  The patient should continue antiplatelet therapy and aggressive treatment of the lipid abnormalities   No changes in the patient's medications at this time   Continued surveillance is indicated as atherosclerosis is likely to progress with time.     The patient will continue follow up with noninvasive studies as ordered.  The patient follow-up in 6 months or sooner if issues arise.           Current Outpatient Medications on File Prior to Visit  Medication Sig Dispense Refill   acetaminophen (TYLENOL) 325 MG tablet Take 1-2 tablets (325-650 mg total) by mouth every 4 (four) hours as needed for mild pain.       amLODipine (NORVASC) 10 MG tablet Take 1 tablet (10 mg total) by mouth daily. 30 tablet 0   apixaban (ELIQUIS) 5 MG TABS tablet Take 1 tablet (5 mg total) by mouth 2 (two) times daily. 60 tablet 1   gabapentin (NEURONTIN) 600 MG tablet Take 1 tablet (600 mg total) by mouth 3 (three) times daily. 90 tablet 0   methocarbamol (ROBAXIN) 500 MG tablet Take 1 tablet (500 mg total) by mouth every 6 (six) hours as needed for muscle spasms. 60 tablet 0   mupirocin ointment (BACTROBAN) 2 % Apply 1 Application topically daily. 30 g 2    nicotine (NICODERM CQ - DOSED IN MG/24 HOURS) 21 mg/24hr patch Place 1 patch (21 mg total) onto the skin daily. 28 patch 0   oxyCODONE (OXY IR/ROXICODONE) 5 MG immediate release tablet Take 1 tablet (5 mg total) by mouth every 6 (six) hours as needed for severe pain. 28 tablet 0   potassium chloride SA (KLOR-CON M) 20 MEQ tablet Take 1 tablet (20 mEq total) by mouth daily. 30 tablet 0   traZODone (DESYREL) 50 MG tablet Take 0.5-1 tablets (25-50 mg total) by mouth at bedtime as needed for sleep. 10 tablet 0   Ferrous Sulfate (IRON) 325 (65 Fe) MG TABS Take 1 tablet (325 mg total) by mouth daily. 60 tablet 0   hydrochlorothiazide (HYDRODIURIL) 25 MG tablet Take 1 tablet (25 mg total) by mouth daily. 30 tablet 0    No current facility-administered medications on file prior to visit.      There are no Patient Instructions on file for this visit. No follow-ups on file.     Georgiana Spinner, NP PAIN:  Are you having pain? No  PRECAUTIONS: Fall  WEIGHT  BEARING RESTRICTIONS: No  FALLS:  Has patient fallen in last 6 months? 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  Pt. Has applied for disability (pending).    PLOF: Independent  PATIENT GOALS: Return to walking with less assistance  NEXT MD VISIT: PRN  OBJECTIVE:   PATIENT SURVEYS:  FOTO initial 46/ goal 71  COGNITION: Overall cognitive status: Within functional limits for tasks assessed     SENSATION: WFL  EDEMA:  Circumferential: 2" superior to template: R 47 cm/ L 51.5 cm.  Recommended shrinker use  MUSCLE LENGTH: Hamstrings: Right WNL; Left WNL  POSTURE: rounded shoulders, esp. When ambulating with RW  PALPATION: No tenderness/ good incision healing.    LOWER EXTREMITY ROM:  Good L LE AROM (all planes).  Prone L hip extension 25  deg.  B UE AROM WNL  LOWER EXTREMITY MMT:  B LE strength grossly 5/5 MMT except L hip adduction/ extension 4/5 MMT.  B UE strength 5/5 MMT.   FUNCTIONAL TESTS:  5 times sit to stand: TBD 6 minute walk test: TBD  GAIT: Distance walked: In clinic/ //-bars Assistive device utilized: Diquan Kassis - 2 wheeled Level of assistance: CGA Comments: pt. Ambulates in //-bars with cuing to increase a more consistent BOS/ recip. Step pattern with L heel strike to enforce L knee control/ wt. Bearing.  Pt. Ambulates at home without prosthetic leg and use of RW household distances.     TODAY'S TREATMENT:                                                                                                                              DATE: 04/14/23  Subjective: Pt. Has been really active over past week taking care of dad during the day/ watching granddaughter. Patient feels as though she has improved slightly since beginning therapy.       FOTO: 45 - decrease from initial FOTO, however patient downplaying improvement as patient wanting to continue to improve to be independent with no AD with the assistance of PT.   Stair negotiation goal - patient able to complete modI with use of rails    Neuro.mm.: Walking outside on parking lot/ sidewalk with SBA/CGA for safety/ cuing with [facility] and no assistive device.  Walking up/down ramps and on grassy terrain with use of SPC working on balance/ consistent step pattern.  Focused on ascending/ descending curbs and grassy terrain.        PATIENT EDUCATION:  Education details: Access Code: [email or web address] Person educated: Patient Education method: Explanation, Demonstration, and Handouts Education comprehension: verbalized understanding and returned demonstration  HOME EXERCISE PROGRAM: Access Code: [email or web address] URL: [email or web address] Date: 07/20/2022 Prepared by: Dorene Grebe  Exercises - Side Stepping with Counter Support  - 1 x daily - 7 x weekly - 2 sets  -  10 reps - Standing Hip Extension with Counter Support  - 1 x daily - 7 x weekly - 2 sets - 10 reps - Supine Single Leg Bridge with Sound Leg (AKA)  - 1 x daily - 7 x weekly - 2 sets - 10 reps - Prone Hip Extension with Residual Limb (AKA)  - 1 x daily - 7 x weekly - 2 sets - 10 reps - Sidelying Hip Abduction with Flexion and Extension (AKA)  - 1 x daily - 7 x weekly - 2 sets - 10 reps  ASSESSMENT:  CLINICAL IMPRESSION:   Patient has made significant improvement with gait since initial evaluation. Has transitioned from utilizing // bars to RW to now [facility]. Continues to be considered high fall risk as patient continues to fall with falls typically occurring with patient looking up to perform a task or look around for granddaughter. She has progressed to higher level balance activities on outside terrain. Continues to be fearful but improved. Able to negotiate stairs with single rail with ease. Patient reluctant for discharge as she feels she has more room for improvement. Discussed need to continue HEP and gait training on various terrain to improve balance and mobility. Patient in agreement at end of session for discharge. She will be following up with prosthetist at end of the month for prosthetic readjustment. Patient to be discharged from PT services at this time.     OBJECTIVE IMPAIRMENTS: Abnormal gait, decreased activity tolerance, decreased balance, decreased endurance, decreased mobility, difficulty walking, decreased ROM, decreased strength, improper body mechanics, postural dysfunction, and pain.   ACTIVITY LIMITATIONS: carrying, lifting, bending, squatting, stairs, transfers, toileting, dressing, and locomotion level  PARTICIPATION LIMITATIONS: cleaning, laundry, driving, shopping, and community activity  PERSONAL FACTORS: Age, Education, and Past/current experiences are also affecting patient's functional outcome.   REHAB POTENTIAL: Good  CLINICAL DECISION MAKING: Evolving/moderate  complexity  EVALUATION COMPLEXITY: Moderate   GOALS: Goals reviewed with patient? Yes  LONG TERM GOALS: Target date: 04/22/23  Pt. Will increase FOTO to 57 to improve functional mobility.   Baseline:  initial 46.  4/11: 49.  6/13: 60  Goal status: Goal met  2.  Pt. Able to ambulate community distances with use of prosthetic leg/ mod. I with least assistive device to improve functional mobility.   Baseline:  CGA for short distances in //-bars.  4/11: amb. With mod. I and use of RW but numerous falls. 6/13: more control, slower gait pattern.  8/16: mod. I with use of SPC.   Goal status: Goal met  3.  Pt. Able to ascend/ descend 2 flights of stairs with mod. I and use of single handrail safely.   Baseline: TBD.  4/11: pt. Requires use of B UE assist and cuing to slow down.  6/13: mod. Independent/ fearful 10/10: modI with use of single rail  Goal status: Goal met   4.  Pt. Will report no falls with household ambulation consistently for a month to improve safety/ mod. Independence at home.    Baseline: 7 falls in past month.  5/16: 1 fall since returning to PT.  6/13: no recent falls over past month.  8/16: pt. Has recent fall in home while walking to bathroom. 10/10: fall on 10/3 with patient reporting she falls when she looks up to do something and does not lock her knee out  Goal status: Partially met   PLAN:  PT FREQUENCY: 1x/week to biweekly  PT DURATION: 8 weeks  PLANNED INTERVENTIONS: Therapeutic exercises, Therapeutic  activity, Neuromuscular re-education, Balance training, Gait training, Patient/Family education, Self Care, Joint mobilization, Stair training, Prosthetic training, scar mobilization, Manual therapy, and Re-evaluation  PLAN FOR NEXT SESSION:  Discharge               Maylon Peppers, PT, DPT  Physical Therapist - [facility]  [facility]  04/21/2023, 4:56 PM

## 2023-04-22 ENCOUNTER — Ambulatory Visit: Payer: Medicaid Other | Admitting: Physical Therapy

## 2023-05-02 DIAGNOSIS — M25561 Pain in right knee: Secondary | ICD-10-CM | POA: Insufficient documentation

## 2023-05-03 ENCOUNTER — Other Ambulatory Visit: Payer: Self-pay | Admitting: Nurse Practitioner

## 2023-05-03 DIAGNOSIS — Z1231 Encounter for screening mammogram for malignant neoplasm of breast: Secondary | ICD-10-CM

## 2023-06-29 ENCOUNTER — Other Ambulatory Visit (INDEPENDENT_AMBULATORY_CARE_PROVIDER_SITE_OTHER): Payer: Self-pay | Admitting: Nurse Practitioner

## 2023-06-29 DIAGNOSIS — Z9889 Other specified postprocedural states: Secondary | ICD-10-CM

## 2023-07-01 ENCOUNTER — Ambulatory Visit (INDEPENDENT_AMBULATORY_CARE_PROVIDER_SITE_OTHER): Payer: Medicaid Other | Admitting: Nurse Practitioner

## 2023-07-01 ENCOUNTER — Encounter (INDEPENDENT_AMBULATORY_CARE_PROVIDER_SITE_OTHER): Payer: Self-pay

## 2023-07-01 ENCOUNTER — Ambulatory Visit (INDEPENDENT_AMBULATORY_CARE_PROVIDER_SITE_OTHER): Payer: Medicaid Other

## 2023-07-01 DIAGNOSIS — Z9889 Other specified postprocedural states: Secondary | ICD-10-CM

## 2023-07-01 DIAGNOSIS — I739 Peripheral vascular disease, unspecified: Secondary | ICD-10-CM | POA: Diagnosis not present

## 2023-07-05 LAB — VAS US ABI WITH/WO TBI: Right ABI: 0.74

## 2023-07-18 ENCOUNTER — Ambulatory Visit: Payer: Medicaid Other | Admitting: Physical Therapy

## 2023-07-20 ENCOUNTER — Encounter: Payer: Medicaid Other | Admitting: Physical Therapy

## 2023-07-21 ENCOUNTER — Ambulatory Visit: Payer: Medicaid Other | Attending: Orthopedic Surgery | Admitting: Physical Therapy

## 2023-07-21 DIAGNOSIS — M6281 Muscle weakness (generalized): Secondary | ICD-10-CM | POA: Insufficient documentation

## 2023-07-21 DIAGNOSIS — G8929 Other chronic pain: Secondary | ICD-10-CM | POA: Insufficient documentation

## 2023-07-21 DIAGNOSIS — M25561 Pain in right knee: Secondary | ICD-10-CM | POA: Insufficient documentation

## 2023-07-21 DIAGNOSIS — R269 Unspecified abnormalities of gait and mobility: Secondary | ICD-10-CM | POA: Insufficient documentation

## 2023-07-21 DIAGNOSIS — Z89612 Acquired absence of left leg above knee: Secondary | ICD-10-CM | POA: Diagnosis present

## 2023-07-22 ENCOUNTER — Encounter: Payer: Self-pay | Admitting: Physical Therapy

## 2023-07-22 NOTE — Therapy (Signed)
 OUTPATIENT PHYSICAL THERAPY LOWER EXTREMITY EVALUATION   Patient Name: Jocelyn [name] MRN: [id number] DOB:Jun 09, 1981, 43 y.o., female Today's Date: 07/22/2023  END OF SESSION:  PT End of Session - 07/22/23 1800     Visit Number 1    Number of Visits 12    Date for PT Re-Evaluation 09/01/23    PT Start Time 1549    PT Stop Time 1648    PT Time Calculation (min) 59 min    Equipment Utilized During Treatment Gait belt    Activity Tolerance Patient tolerated treatment well    Behavior During Therapy WFL for tasks assessed/performed             Past Medical History:  Diagnosis Date   IDA (iron  deficiency anemia) 12/17/2019   Past Surgical History:  Procedure Laterality Date   AMPUTATION Left 01/20/2022   Procedure: AMPUTATION ABOVE KNEE;  Surgeon: Marea Selinda RAMAN, MD;  Location: [facility];  Service: Vascular;  Laterality: Left;   APPENDECTOMY     LOWER EXTREMITY ANGIOGRAPHY Left 01/11/2020   Procedure: Lower Extremity Angiography;  Surgeon: Marea Selinda RAMAN, MD;  Location: [facility];  Service: Cardiovascular;  Laterality: Left;   LOWER EXTREMITY ANGIOGRAPHY Left 03/06/2020   Procedure: Lower Extremity Angiography;  Surgeon: Marea Selinda RAMAN, MD;  Location: [facility];  Service: Cardiovascular;  Laterality: Left;   LOWER EXTREMITY ANGIOGRAPHY Left 03/07/2020   Procedure: Lower Extremity Angiography;  Surgeon: Marea Selinda RAMAN, MD;  Location: [facility];  Service: Cardiovascular;  Laterality: Left;   LOWER EXTREMITY ANGIOGRAPHY Left 01/06/2022   Procedure: Lower Extremity Angiography;  Surgeon: Marea Selinda RAMAN, MD;  Location: [facility];  Service: Cardiovascular;  Laterality: Left;   LOWER EXTREMITY ANGIOGRAPHY Left 01/07/2022   Procedure: Lower Extremity Angiography;  Surgeon: Marea Selinda RAMAN, MD;  Location: [facility];  Service: Cardiovascular;  Laterality: Left;   LOWER EXTREMITY ANGIOGRAPHY Left 01/18/2022   Procedure: Lower Extremity Angiography;   Surgeon: Marea Selinda RAMAN, MD;  Location: [facility];  Service: Cardiovascular;  Laterality: Left;   LOWER EXTREMITY INTERVENTION Bilateral 01/17/2022   Procedure: LOWER EXTREMITY INTERVENTION;  Surgeon: Clarice Martin, MD;  Location: [facility];  Service: Cardiovascular;  Laterality: Bilateral;   PERIPHERAL VASCULAR THROMBECTOMY Left 01/20/2022   Procedure: PERIPHERAL VASCULAR THROMBECTOMY Left Femoral vein;  Surgeon: Marea Selinda RAMAN, MD;  Location: [facility];  Service: Vascular;  Laterality: Left;   Patient Active Problem List   Diagnosis Date Noted   S/P AKA (above knee amputation) unilateral, left (HCC) 01/25/2022   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   Acute urinary retention 01/19/2022   Fever 01/18/2022   Acute deep vein thrombosis (DVT) of left femoral vein (HCC) 01/17/2022   Hypomagnesemia 01/06/2022   Hyponatremia 01/06/2022   Critical limb ischemia of left lower extremity (HCC) 01/10/2020   Diabetes mellitus without complication (HCC) 12/17/2019   IDA (iron  deficiency anemia) 12/17/2019   HTN (hypertension) 12/17/2019   REFERRING PROVIDER: Kathlynn Sharper, MD  REFERRING DIAG: Primary osteoarthritis of right knee  THERAPY DIAG:  Chronic pain of right knee  Muscle weakness (generalized)  Gait difficulty  S/P AKA (above knee amputation) unilateral, left (HCC)  Rationale for Evaluation and Treatment: Rehabilitation  ONSET DATE: Chronic  SUBJECTIVE:   SUBJECTIVE STATEMENT: Pt. Reports acute on chronic R knee pain after falling in November.  Pt. Reports 2/10 R knee pain currently at rest and 7/10 at worst with increase walking/ activity.  Pt. Received cortisone injection in R knee with benefit.  Pt. Known well to PT clinic after complete PT for L AKA/ prosthetic training.    PERTINENT HISTORY: The patient is a 43 year old female who presents for evaluation of right leg pain.  She underwent a left above-knee amputation and she as advised her right knee pain was due  to the inability to fully bear weight on the left leg. She continues to experience swelling and instability in her knee, leading to her referral here.   The patient also experiences numbness in her right foot which extends to her leg, which she attributes to prolonged standing. She was not going to therapy but he has been discharged. The patient has not received a cortisone injection and is not diabetic.  She developed blood clots in the left lower extremity after the COVID-19 vaccine.  The patient is a smoker.   I have reviewed past medical, surgical, social and family history, and allergies as documented in the EMR.  Past Medical History: Past Medical History:  Diagnosis Date  Hyperlipidemia  Hypertension   Past Surgical History: History reviewed. No pertinent surgical history.  Past Family History: History reviewed. No pertinent family history.  Medications: Current Outpatient Medications  Medication Sig Dispense Refill  acetaminophen  (TYLENOL ) 325 MG tablet Take 650 mg by mouth every 4 (four) hours as needed for Pain  amLODIPine  (NORVASC ) 10 MG tablet Take 1 tablet by mouth once daily  apixaban  (ELIQUIS ) 5 mg tablet Take 5 mg by mouth 2 (two) times daily  atorvastatin (LIPITOR) 20 MG tablet Take 1 tablet by mouth once daily  ferrous sulfate  325 (65 FE) MG tablet Take 325 mg by mouth once daily  gabapentin  (NEURONTIN ) 600 MG tablet Take 600 mg by mouth 3 (three) times daily  hydroCHLOROthiazide  (HYDRODIURIL ) 25 MG tablet Take 1 tablet by mouth once daily   Current Facility-Administered Medications  Medication Dose Route Frequency Provider Last Rate Last Admin  betamethasone acetate-betamethasone sodium phosphate  (CELESTONE) injection 6 mg 6 mg Intra-articular Once Menz, Wassim Kirksey Joseph, MD  BUPivacaine HCl (MARCAINE) 0.5 % injection 2 mL 2 mL Intra-articular Once Menz, Yanin Muhlestein Joseph, MD   Allergies: No Known Allergies   Body mass index is 26.97 kg/m.  Review of  Systems: A comprehensive 14 point ROS was performed, reviewed, and the pertinent orthopaedic findings are documented in the HPI.  Vitals:  05/23/23 0821  BP: 136/78    General Physical Examination:   General/Constitutional: No apparent distress: well-nourished and well developed. Eyes: Pupils equal, round with synchronous movement. Lungs: Clear to auscultation HEENT: Normal Vascular: No edema, swelling or tenderness, except as noted in detailed exam. Cardiac: Heart rate and rhythm is regular. Integumentary: No impressive skin lesions present, except as noted in detailed exam. Neuro/Psych: Normal mood and affect, oriented to person, place, and time.  Musculoskeletal: Patellofemoral crepitation is present in the medial compartment of the right knee. Varus deformity is passively correctable. No Baker cyst is present. Right hip has 60 degrees internal rotation and 30 degrees of external rotation.  Radiographs:  AP, lateral, and oblique x-rays of the right knee were ordered and personally reviewed today. These show medial compartment moderate osteoarthritis with joint space narrowing and spurring medially. On the lateral view, spurring posteriorly on the femoral and tibial condyles. Significant patellofemoral degenerative change, lateral facet arthritis, and spurring are present. Varus deformity is observed.  X-ray Impression  Moderate right knee medial and patellofemoral degenerative changes.   Assessment: ICD-10-CM  1. Primary osteoarthritis of  right knee M17.11  2. Acute pain of right knee M25.561  3. Diabetes mellitus without complication (CMS/HHS-HCC) E11.9   Plan:  The patient has clinical findings of moderate right knee medial and patellofemoral degenerative changes.  The patient's symptoms and x-ray findings indicate moderate right knee medial and patellofemoral degenerative changes. A cortisone injection will be administered today. If the cortisone injection does not provide  relief, alternative treatment options will be considered. Physical therapy to build up muscle strength was discussed as a potential treatment to help alleviate pain and improve knee stability.  Knee Injection: The skin was first prepped with chlorhexidine  inferolaterally. Ethyl Chloride spray was then applied. A 25 gauge needle was inserted and 1 cc Betamethasone and 3 cc Marcaine was injected. The patient tolerated this well. A Band-Aid was applied.  PAIN:  Are you having pain? Yes: NPRS scale: 2/10 Pain location: R knee (medial aspect) Pain description: aching/ sharp Aggravating factors: standing/ walking Relieving factors: meds/ injection  PRECAUTIONS: Fall  RED FLAGS: None   WEIGHT BEARING RESTRICTIONS: No  FALLS:  Has patient fallen in last 6 months? Yes. 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  Pt. Has applied for disability (pending)- denied 3 times.    PLOF: Requires assistive device for independence  PATIENT GOALS: Increase R knee strength/ improve pain-free mobility.    NEXT MD VISIT: PRN  OBJECTIVE:  Note: Objective measures were completed at Evaluation unless otherwise noted.  DIAGNOSTIC FINDINGS:  Narrative  05/23/2023 2:05 PM EST   AP, lateral, and oblique x-rays of the right knee were ordered and personally reviewed today. These show medial compartment moderate osteoarthritis with joint space narrowing and spurring medially. On the lateral view, spurring posteriorly on the femoral and tibial condyles. Significant patellofemoral degenerative change, lateral facet arthritis, and spurring are present. Varus deformity is observed.  X-ray Impression Moderate right knee medial and patellofemoral degenerative changes.     PATIENT SURVEYS:   FOTO initial 45/ goal 32  COGNITION: Overall cognitive status: Within functional limits for tasks assessed     SENSATION: WFL  EDEMA:  No swelling noted.  Unable to compare to L knee secondary to AKA.    MUSCLE LENGTH: Hamstrings: Right WFL; Left WFL  POSTURE: rounded shoulders  PALPATION: (+) R medial knee joint line tenderness  LOWER EXTREMITY ROM:  Active ROM Right eval Left eval  Hip flexion    Hip extension    Hip abduction    Hip adduction    Hip internal rotation    Hip external rotation    Knee flexion 126 deg.   Knee extension 0 deg.    Ankle dorsiflexion    Ankle plantarflexion    Ankle inversion    Ankle eversion     (Blank rows = not tested)  LOWER EXTREMITY MMT:  MMT Right eval Left eval  Hip flexion 4/5 4-/5  Hip extension    Hip abduction 4/5 4/5  Hip adduction    Hip internal rotation    Hip external rotation    Knee flexion 4/5 (pain)   Knee extension 4/5   Ankle dorsiflexion 5/5   Ankle plantarflexion    Ankle inversion    Ankle eversion     (Blank rows = not tested)  LOWER EXTREMITY SPECIAL TESTS:  Knee special tests: Anterior drawer test: negative and McMurray's test: negative  FUNCTIONAL TESTS:  5 times sit to stand: TBD  GAIT: Distance walked: in clinic Assistive device utilized: Single point cane Level of assistance: Modified independence Comments: L prosthetic leg with good knee mechanism control.  R knee varus during stance phase of gait with slight R antalgic gait.  Increase c/o R knee discomfort with increase distance walked.                                                                                                                                TREATMENT DATE: 07/21/23  See evaluation/ Issued HEP ([email or web address])    PATIENT EDUCATION:  Education details: Reviewed HEP Person educated: Patient Education method: Explanation, Demonstration, and Handouts Education comprehension: verbalized understanding and returned  demonstration  HOME EXERCISE PROGRAM: Access Code: [email or web address] URL: [email or web address] Date: 07/21/2023 Prepared by: Ozell Sero  Exercises - Straight Leg Raise  - 1 x daily - 7 x weekly - 2 sets - 20 reps - Sidelying Hip Abduction  - 1 x daily - 7 x weekly - 2 sets - 20 reps - Supine Quad Set  - 1 x daily - 7 x weekly - 2 sets - 10 reps - Seated March with Resistance  - 1 x daily - 7 x weekly - 2 sets - 10 reps - Seated Hip Abduction with Resistance  - 1 x daily - 7 x weekly - 2 sets - 10 reps  ASSESSMENT:  CLINICAL IMPRESSION: Patient is a pleasant 43 y.o. female who was seen today for physical therapy evaluation and treatment for R knee pain.  Pt. Presents with good R knee ROM but B hip/ R knee strength deficits.  Pt. Has (+) tenderness along R medial knee with palpation and antalgic gait pattern.  Pt. Will benefit from skilled PT services to increase R LE strength and improve pain-free mobility with standing/ walking tasks.   OBJECTIVE IMPAIRMENTS: Abnormal gait, decreased activity tolerance, decreased balance, decreased endurance, decreased mobility, difficulty walking, decreased strength, hypomobility, improper body mechanics, and pain.   ACTIVITY LIMITATIONS: standing, squatting, stairs, transfers, locomotion level, and caring for others  PARTICIPATION LIMITATIONS: cleaning and community activity  PERSONAL FACTORS: Fitness and Past/current experiences are also affecting patient's functional outcome.   REHAB POTENTIAL: Good  CLINICAL DECISION MAKING: Evolving/moderate complexity  EVALUATION COMPLEXITY: Moderate   GOALS: Goals reviewed with patient? Yes  SHORT TERM GOALS: Target date: 08/11/23 Pt. Independent with HEP to increase R hip/knee strength 1/2 muscle grade to improve pain-free mobility.   Baseline:  see above Goal status: INITIAL  LONG TERM GOALS: Target date: 09/01/23  Pt. Will increase FOTO to 55 to improve pain-free mobility.   Baseline:  initial 45 Goal status: INITIAL  2.  Pt. Able to ambulate community distance with use of SPC and no increase c/o R knee pain.   Baseline: 7/10 R knee pain with walking Goal status: INITIAL  3.  Pt. Able to stand from chair with no increase c/o R knee pain to improve pain-free mobility.  Baseline: marked increase in R knee pain.   Goal status: INITIAL   PLAN:  PT FREQUENCY: 2x/week  PT DURATION: 6 weeks  PLANNED INTERVENTIONS: 97110-Therapeutic exercises, 97530- Therapeutic activity, [id number]- Neuromuscular re-education, 97535- Self Care, [id number]- Manual therapy, [phone]- Gait training, [id number]- Electrical stimulation (unattended), Patient/Family education, Balance training, Stair training, Dry Needling, Joint mobilization, Cryotherapy, and Moist heat  PLAN FOR NEXT SESSION: reassess HEP/ progress quad strengthening.    Ozell JAYSON Sero, PT, DPT # [phone] 07/22/2023, 6:03 PM

## 2023-07-25 ENCOUNTER — Ambulatory Visit: Payer: Medicaid Other | Admitting: Physical Therapy

## 2023-07-26 ENCOUNTER — Encounter (INDEPENDENT_AMBULATORY_CARE_PROVIDER_SITE_OTHER): Payer: Self-pay | Admitting: Nurse Practitioner

## 2023-07-26 ENCOUNTER — Ambulatory Visit (INDEPENDENT_AMBULATORY_CARE_PROVIDER_SITE_OTHER): Payer: Medicaid Other | Admitting: Nurse Practitioner

## 2023-07-26 VITALS — BP 139/92 | HR 94 | Resp 16 | Wt 189.0 lb

## 2023-07-26 DIAGNOSIS — I739 Peripheral vascular disease, unspecified: Secondary | ICD-10-CM | POA: Diagnosis not present

## 2023-07-26 DIAGNOSIS — Z9889 Other specified postprocedural states: Secondary | ICD-10-CM

## 2023-07-26 DIAGNOSIS — Z89612 Acquired absence of left leg above knee: Secondary | ICD-10-CM

## 2023-07-26 DIAGNOSIS — I10 Essential (primary) hypertension: Secondary | ICD-10-CM

## 2023-07-26 MED ORDER — PREGABALIN 75 MG PO CAPS
75.0000 mg | ORAL_CAPSULE | Freq: Two times a day (BID) | ORAL | 0 refills | Status: DC
Start: 2023-07-26 — End: 2023-08-10

## 2023-07-26 NOTE — Progress Notes (Signed)
 Subjective:    Patient ID: Jocelyn [name], female    DOB: 16-May-1981, 43 y.o.   MRN: [id number] Chief Complaint  Patient presents with   Follow-up    Ultrasound results    The patient returns to the office for followup regarding atherosclerotic changes of the lower extremities and review of the noninvasive studies.   There have been no interval changes in lower extremity symptoms. No interval shortening of the patient's claudication distance or development of rest pain symptoms. No new ulcers or wounds have occurred since the last visit.  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  There have been no significant changes to the patient's overall health care.  The patient denies amaurosis fugax or recent TIA symptoms. There are no documented recent neurological changes noted. There is no history of DVT, PE or superficial thrombophlebitis. The patient denies recent episodes of angina or shortness of breath.   ABI Rt=0.74 and Lt=aka  (previous ABI's Rt=0.91 and Lt=aka) Duplex ultrasound of the tibial vessels shows monophasic waveforms    Review of Systems  Cardiovascular:  Positive for leg swelling.  Musculoskeletal:  Positive for arthralgias.  All other systems reviewed and are negative.      Objective:   Physical Exam Vitals reviewed.  HENT:     Head: Normocephalic.  Cardiovascular:     Rate and Rhythm: Normal rate.  Pulmonary:     Effort: Pulmonary effort is normal.  Skin:    General: Skin is warm and dry.  Neurological:     Mental Status: She is alert and oriented to person, place, and time.  Psychiatric:        Mood and Affect: Mood normal.        Behavior: Behavior normal.        Thought Content: Thought content normal.        Judgment: Judgment normal.     BP (!) 139/92   Pulse 94   Resp 16   Wt 189 lb (85.7 kg)   BMI 31.45 kg/m   Past Medical History:  Diagnosis Date   IDA (iron   deficiency anemia) 12/17/2019    Social History   Socioeconomic History   Marital status: Legally Separated    Spouse name: Not on file   Number of children: Not on file   Years of education: Not on file   Highest education level: Not on file  Occupational History   Not on file  Tobacco Use   Smoking status: Every Day    Current packs/day: 0.50    Types: Cigarettes   Smokeless tobacco: Never  Vaping Use   Vaping status: Never Used  Substance and Sexual Activity   Alcohol use: Yes   Drug use: Never   Sexual activity: Not Currently  Other Topics Concern   Not on file  Social History Narrative   Not on file   Social Drivers of Health   Financial Resource Strain: Not on file  Food Insecurity: Not on file  Transportation Needs: Not on file  Physical Activity: Not on file  Stress: Not on file  Social Connections: Not on file  Intimate Partner Violence: Not on file    Past Surgical History:  Procedure Laterality Date   AMPUTATION Left 01/20/2022   Procedure: AMPUTATION ABOVE KNEE;  Surgeon: Marea Selinda RAMAN, MD;  Location: [facility];  Service: Vascular;  Laterality: Left;   APPENDECTOMY     LOWER EXTREMITY  ANGIOGRAPHY Left 01/11/2020   Procedure: Lower Extremity Angiography;  Surgeon: Marea Selinda RAMAN, MD;  Location: [facility];  Service: Cardiovascular;  Laterality: Left;   LOWER EXTREMITY ANGIOGRAPHY Left 03/06/2020   Procedure: Lower Extremity Angiography;  Surgeon: Marea Selinda RAMAN, MD;  Location: [facility];  Service: Cardiovascular;  Laterality: Left;   LOWER EXTREMITY ANGIOGRAPHY Left 03/07/2020   Procedure: Lower Extremity Angiography;  Surgeon: Marea Selinda RAMAN, MD;  Location: [facility];  Service: Cardiovascular;  Laterality: Left;   LOWER EXTREMITY ANGIOGRAPHY Left 01/06/2022   Procedure: Lower Extremity Angiography;  Surgeon: Marea Selinda RAMAN, MD;  Location: [facility];  Service: Cardiovascular;  Laterality: Left;   LOWER EXTREMITY ANGIOGRAPHY  Left 01/07/2022   Procedure: Lower Extremity Angiography;  Surgeon: Marea Selinda RAMAN, MD;  Location: [facility];  Service: Cardiovascular;  Laterality: Left;   LOWER EXTREMITY ANGIOGRAPHY Left 01/18/2022   Procedure: Lower Extremity Angiography;  Surgeon: Marea Selinda RAMAN, MD;  Location: [facility];  Service: Cardiovascular;  Laterality: Left;   LOWER EXTREMITY INTERVENTION Bilateral 01/17/2022   Procedure: LOWER EXTREMITY INTERVENTION;  Surgeon: Clarice Martin, MD;  Location: [facility];  Service: Cardiovascular;  Laterality: Bilateral;   PERIPHERAL VASCULAR THROMBECTOMY Left 01/20/2022   Procedure: PERIPHERAL VASCULAR THROMBECTOMY Left Femoral vein;  Surgeon: Marea Selinda RAMAN, MD;  Location: [facility];  Service: Vascular;  Laterality: Left;    Family History  Problem Relation Age of Onset   Hypertension Mother    Hypertension Sister    Hypertension Brother     No Known Allergies     Latest Ref Rng & Units 02/01/2022    8:07 AM 01/26/2022    5:36 AM 01/25/2022    5:37 AM  CBC  WBC 4.0 - 10.5 K/uL 6.9  5.3  4.8   Hemoglobin 12.0 - 15.0 g/dL 89.0  9.2  9.3   Hematocrit 36.0 - 46.0 % 36.5  29.8  31.7   Platelets 150 - 400 K/uL 514  466  474       CMP     Component Value Date/Time   NA 139 02/01/2022 0807   NA 134 (L) 04/24/2013 1557   K 3.7 02/01/2022 0807   K 4.4 04/24/2013 1557   CL 105 02/01/2022 0807   CL 105 04/24/2013 1557   CO2 24 02/01/2022 0807   CO2 24 04/24/2013 1557   GLUCOSE 128 (H) 02/01/2022 0807   GLUCOSE 103 (H) 04/24/2013 1557   BUN 13 02/01/2022 0807   BUN 7 04/24/2013 1557   CREATININE 0.66 02/01/2022 0807   CREATININE 0.51 (L) 04/24/2013 1557   CALCIUM 10.3 02/01/2022 0807   CALCIUM 9.1 04/24/2013 1557   PROT 5.6 (L) 01/26/2022 0536   PROT 8.1 04/24/2013 1557   ALBUMIN 2.2 (L) 01/26/2022 0536   ALBUMIN 4.1 04/24/2013 1557   AST 26 01/26/2022 0536   AST 43 (H) 04/24/2013 1557   ALT 23 01/26/2022 0536   ALT 29 04/24/2013 1557    ALKPHOS 102 01/26/2022 0536   ALKPHOS 81 04/24/2013 1557   BILITOT 0.4 01/26/2022 0536   BILITOT 0.4 04/24/2013 1557   GFRNONAA >60 02/01/2022 0807   GFRNONAA >60 04/24/2013 1557     VAS US  ABI WITH/WO TBI Result Date: 07/05/2023  LOWER EXTREMITY DOPPLER STUDY Patient Name:  CHARIAH BAILEY  Date of Exam:   07/01/2023 Medical Rec #: [id number]       Accession #:  [id number] Date of Birth: 1980-09-10        Patient Gender: F Patient Age:   73 years Exam Location:  [facility] Procedure:      VAS US  ABI WITH/WO TBI Referring Phys: ORVIN DARING --------------------------------------------------------------------------------  Indications: Peripheral artery disease. High Risk Factors: Hypertension.  Vascular Interventions: 01/11/2020: Aortogram and selective Left Lower Extremity                         Angiogram including selective Imaging of the Posterior                         Tibial Artery. PTA of the Left PTA and Tibioperoneal                         trunk. PTA of the Left Popliteal Artery and SFA.                         Mechanical Thrombectomy of the Left SFA, Popliteal                         Artery, Tibioperoneal trunk and Posterior Tibial Artery.                         Viabahn covered stent placement to the most distal SFA,                         Popliteal Artery and Tibioperoneal Trunk.                          01/18/2022: Left Lower Extremity Angiogram. Mechanical                         Thrombectomy of the Left SFA, Popliteal                         Artery,Tibioperoneal Trunk and Proximal Posterior Tibial                         Artery with the Penumbra CAT 6 device. PTA of the Left                         PTA and Tibioperoneal Trunk trunk with 2.5 mm diameter                         by 30 cm length angioplasty Balloon. PTA of the Left SFA                         and Popliteal Artery with 2 inflations with a 5 mm                         diameter by 30 cm length Lutonix drug coated  angioplasty                         balloon.                          01/20/2022: Lt AKA. Performing  Technologist: Donnice Charnley RVT  Examination Guidelines: A complete evaluation includes at minimum, Doppler waveform signals and systolic blood pressure reading at the level of bilateral brachial, anterior tibial, and posterior tibial arteries, when vessel segments are accessible. Bilateral testing is considered an integral part of a complete examination. Photoelectric Plethysmograph (PPG) waveforms and toe systolic pressure readings are included as required and additional duplex testing as needed. Limited examinations for reoccurring indications may be performed as noted.  ABI Findings: +---------+------------------+-----+--------+--------+ Right    Rt Pressure (mmHg)IndexWaveformComment  +---------+------------------+-----+--------+--------+ Brachial 130                                     +---------+------------------+-----+--------+--------+ PTA      61                0.46                  +---------+------------------+-----+--------+--------+ DP       98                0.74                  +---------+------------------+-----+--------+--------+ Great Toe92                0.69                  +---------+------------------+-----+--------+--------+ +--------+------------------+-----+--------+-------+ Left    Lt Pressure (mmHg)IndexWaveformComment +--------+------------------+-----+--------+-------+ [id number]                                    +--------+------------------+-----+--------+-------+ +-------+-----------+-----------+------------+------------+ ABI/TBIToday's ABIToday's TBIPrevious ABIPrevious TBI +-------+-----------+-----------+------------+------------+ Right  0.74       0.69       0.91        0.68         +-------+-----------+-----------+------------+------------+ Left   AKA                   AKA                       +-------+-----------+-----------+------------+------------+ Right ABIs appear decreased compared to prior study on 12/31/2022. ABI decreased compared to prior study however is stable compare to studies performed on 07/02/2022 (0.69) and 02/05/2022 (0.71)  Summary: Right: Resting right ankle-brachial index indicates moderate right lower extremity arterial disease. The right toe-brachial index is abnormal. *See table(s) above for measurements and observations.  Electronically signed by Selinda Gu MD on 07/05/2023 at 7:55:43 AM.    Final        Assessment & Plan:   1. Peripheral arterial disease with history of revascularization (HCC) (Primary) Today the patient's noninvasive studies show decreased ABIs but when reviewing the previous studies from about a year ago they are consistent with those studies and the patient has had intervention.  I suspect that the most recent studies in June may have been falsely elevated.  At this time she has mild claudication symptoms but her bigger issues with her knee.  At this point we do not recommend any intervention but if the patient ultimately does need intervention on her knee we may need to have a discussion Prior to relates to wound healing.  2. 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  We will change to  Lyrica  and see how this does.  Will consider increasing or adding an agent if it is not helpful in the next few weeks  3. Primary hypertension Continue antihypertensive medications as already ordered, these medications have been reviewed and there are no changes at this time.   Current Outpatient Medications on File Prior to Visit  Medication Sig Dispense Refill   acetaminophen  (TYLENOL ) 325 MG tablet Take 1-2 tablets (325-650 mg total) by mouth every 4 (four) hours as needed for mild pain.     amLODipine  (NORVASC ) 10 MG tablet Take 1 tablet (10 mg total) by mouth daily.  30 tablet 0   apixaban  (ELIQUIS ) 5 MG TABS tablet Take 1 tablet (5 mg total) by mouth 2 (two) times daily. 60 tablet 1   atorvastatin (LIPITOR) 20 MG tablet Take 20 mg by mouth daily.     Ferrous Sulfate  (IRON ) 325 (65 Fe) MG TABS Take 1 tablet (325 mg total) by mouth daily. 60 tablet 0   hydrochlorothiazide  (HYDRODIURIL ) 25 MG tablet Take 1 tablet (25 mg total) by mouth daily. 30 tablet 0   methocarbamol  (ROBAXIN ) 500 MG tablet Take 1 tablet (500 mg total) by mouth every 6 (six) hours as needed for muscle spasms. (Patient not taking: Reported on 07/26/2023) 60 tablet 0   mupirocin  ointment (BACTROBAN ) 2 % Apply 1 Application topically daily. (Patient not taking: Reported on 07/26/2023) 30 g 2   nicotine  (NICODERM CQ  - DOSED IN MG/24 HOURS) 21 mg/24hr patch Place 1 patch (21 mg total) onto the skin daily. (Patient not taking: Reported on 07/26/2023) 28 patch 0   oxyCODONE  (OXY IR/ROXICODONE ) 5 MG immediate release tablet Take 1 tablet (5 mg total) by mouth every 6 (six) hours as needed for severe pain. (Patient not taking: Reported on 07/26/2023) 28 tablet 0   potassium chloride  SA (KLOR-CON  M) 20 MEQ tablet Take 1 tablet (20 mEq total) by mouth daily. (Patient not taking: Reported on 07/26/2023) 30 tablet 0   traZODone  (DESYREL ) 50 MG tablet Take 0.5-1 tablets (25-50 mg total) by mouth at bedtime as needed for sleep. (Patient not taking: Reported on 07/26/2023) 10 tablet 0   No current facility-administered medications on file prior to visit.    There are no Patient Instructions on file for this visit. No follow-ups on file.   Briante Loveall E Teghan Philbin, NP

## 2023-07-27 ENCOUNTER — Encounter: Payer: Medicaid Other | Admitting: Physical Therapy

## 2023-08-01 ENCOUNTER — Encounter: Payer: Self-pay | Admitting: Physical Therapy

## 2023-08-01 ENCOUNTER — Ambulatory Visit: Payer: Medicaid Other | Admitting: Physical Therapy

## 2023-08-01 DIAGNOSIS — R269 Unspecified abnormalities of gait and mobility: Secondary | ICD-10-CM

## 2023-08-01 DIAGNOSIS — Z89612 Acquired absence of left leg above knee: Secondary | ICD-10-CM

## 2023-08-01 DIAGNOSIS — M25561 Pain in right knee: Secondary | ICD-10-CM | POA: Diagnosis not present

## 2023-08-01 DIAGNOSIS — G8929 Other chronic pain: Secondary | ICD-10-CM

## 2023-08-01 DIAGNOSIS — M6281 Muscle weakness (generalized): Secondary | ICD-10-CM

## 2023-08-01 NOTE — Therapy (Signed)
OUTPATIENT PHYSICAL THERAPY LOWER EXTREMITY TREATMENT  Patient Name: Jocelyn [name] MRN: [id number] DOB:1981/06/01, 43 y.o., female Today's Date: 08/01/2023  END OF SESSION:  PT End of Session - 08/01/23 0852     Visit Number 2    Number of Visits 12    Date for PT Re-Evaluation 09/01/23    PT Start Time 0852    PT Stop Time 0944    PT Time Calculation (min) 52 min    Equipment Utilized During Treatment Gait belt    Activity Tolerance Patient tolerated treatment well    Behavior During Therapy [place] for tasks assessed/performed            Past Medical History:  Diagnosis Date   IDA (iron deficiency anemia) 12/17/2019   Past Surgical History:  Procedure Laterality Date   AMPUTATION Left 01/20/2022   Procedure: AMPUTATION ABOVE KNEE;  Surgeon: Annice Needy, MD;  Location: [facility];  Service: Vascular;  Laterality: Left;   APPENDECTOMY     LOWER EXTREMITY ANGIOGRAPHY Left 01/11/2020   Procedure: Lower Extremity Angiography;  Surgeon: Annice Needy, MD;  Location: [facility];  Service: Cardiovascular;  Laterality: Left;   LOWER EXTREMITY ANGIOGRAPHY Left 03/06/2020   Procedure: Lower Extremity Angiography;  Surgeon: Annice Needy, MD;  Location: [facility];  Service: Cardiovascular;  Laterality: Left;   LOWER EXTREMITY ANGIOGRAPHY Left 03/07/2020   Procedure: Lower Extremity Angiography;  Surgeon: Annice Needy, MD;  Location: [facility];  Service: Cardiovascular;  Laterality: Left;   LOWER EXTREMITY ANGIOGRAPHY Left 01/06/2022   Procedure: Lower Extremity Angiography;  Surgeon: Annice Needy, MD;  Location: [facility];  Service: Cardiovascular;  Laterality: Left;   LOWER EXTREMITY ANGIOGRAPHY Left 01/07/2022   Procedure: Lower Extremity Angiography;  Surgeon: Annice Needy, MD;  Location: [facility];  Service: Cardiovascular;  Laterality: Left;   LOWER EXTREMITY ANGIOGRAPHY Left 01/18/2022   Procedure: Lower Extremity Angiography;  Surgeon:  Annice Needy, MD;  Location: [facility];  Service: Cardiovascular;  Laterality: Left;   LOWER EXTREMITY INTERVENTION Bilateral 01/17/2022   Procedure: LOWER EXTREMITY INTERVENTION;  Surgeon: Learta Codding, MD;  Location: [facility];  Service: Cardiovascular;  Laterality: Bilateral;   PERIPHERAL VASCULAR THROMBECTOMY Left 01/20/2022   Procedure: PERIPHERAL VASCULAR THROMBECTOMY Left Femoral vein;  Surgeon: Annice Needy, MD;  Location: [facility];  Service: Vascular;  Laterality: Left;   Patient Active Problem List   Diagnosis Date Noted   S/P AKA (above knee amputation) unilateral, left (HCC) 01/25/2022   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   Acute urinary retention 01/19/2022   Fever 01/18/2022   Acute deep vein thrombosis (DVT) of left femoral vein (HCC) 01/17/2022   Hypomagnesemia 01/06/2022   Hyponatremia 01/06/2022   Critical limb ischemia of left lower extremity (HCC) 01/10/2020   Diabetes mellitus without complication (HCC) 12/17/2019   IDA (iron deficiency anemia) 12/17/2019   HTN (hypertension) 12/17/2019   REFERRING PROVIDER: Kennedy Bucker, MD  REFERRING DIAG: Primary osteoarthritis of right knee  THERAPY DIAG:  Chronic pain of right knee  Muscle weakness (generalized)  Gait difficulty  S/P AKA (above knee amputation) unilateral, left (HCC)  Rationale for Evaluation and Treatment: Rehabilitation  ONSET DATE: Chronic  SUBJECTIVE:   SUBJECTIVE STATEMENT: Pt. Reports acute on chronic R knee pain after falling in November.  Pt. Reports 2/10 R knee pain currently at rest and 7/10 at worst with increase walking/ activity.  Pt. Received  cortisone injection in R knee with benefit.  Pt. Known well to PT clinic after complete PT for L AKA/ prosthetic training.    PERTINENT HISTORY: The patient is a 43 year old female who presents for evaluation of right leg pain.  She underwent a left above-knee amputation and she as advised her right knee pain was due to the  inability to fully bear weight on the left leg. She continues to experience swelling and instability in her knee, leading to her referral here.   The patient also experiences numbness in her right foot which extends to her leg, which she attributes to prolonged standing. She was not going to therapy but he has been discharged. The patient has not received a cortisone injection and is not diabetic.  She developed blood clots in the left lower extremity after the COVID-19 vaccine.  The patient is a smoker.   I have reviewed past medical, surgical, social and family history, and allergies as documented in the EMR.  Past Medical History: Past Medical History:  Diagnosis Date  Hyperlipidemia  Hypertension   Past Surgical History: History reviewed. No pertinent surgical history.  Past Family History: History reviewed. No pertinent family history.  Medications: Current Outpatient Medications  Medication Sig Dispense Refill  acetaminophen (TYLENOL) 325 MG tablet Take 650 mg by mouth every 4 (four) hours as needed for Pain  amLODIPine (NORVASC) 10 MG tablet Take 1 tablet by mouth once daily  apixaban (ELIQUIS) 5 mg tablet Take 5 mg by mouth 2 (two) times daily  atorvastatin (LIPITOR) 20 MG tablet Take 1 tablet by mouth once daily  ferrous sulfate 325 (65 FE) MG tablet Take 325 mg by mouth once daily  gabapentin (NEURONTIN) 600 MG tablet Take 600 mg by mouth 3 (three) times daily  hydroCHLOROthiazide (HYDRODIURIL) 25 MG tablet Take 1 tablet by mouth once daily   Current Facility-Administered Medications  Medication Dose Route Frequency Provider Last Rate Last Admin  betamethasone acetate-betamethasone sodium phosphate (CELESTONE) injection 6 mg 6 mg Intra-articular Once Marlena Clipper, MD  BUPivacaine HCl (MARCAINE) 0.5 % injection 2 mL 2 mL Intra-articular Once Marlena Clipper, MD   Allergies: No Known Allergies   Body mass index is 26.97 kg/m.  Review of Systems: A  comprehensive 14 point ROS was performed, reviewed, and the pertinent orthopaedic findings are documented in the HPI.  Vitals:  05/23/23 0821  BP: 136/78    General Physical Examination:   General/Constitutional: No apparent distress: well-nourished and well developed. Eyes: Pupils equal, round with synchronous movement. Lungs: Clear to auscultation HEENT: Normal Vascular: No edema, swelling or tenderness, except as noted in detailed exam. Cardiac: Heart rate and rhythm is regular. Integumentary: No impressive skin lesions present, except as noted in detailed exam. Neuro/Psych: Normal mood and affect, oriented to person, place, and time.  Musculoskeletal: Patellofemoral crepitation is present in the medial compartment of the right knee. Varus deformity is passively correctable. No Baker cyst is present. Right hip has 60 degrees internal rotation and 30 degrees of external rotation.  Radiographs:  AP, lateral, and oblique x-rays of the right knee were ordered and personally reviewed today. These show medial compartment moderate osteoarthritis with joint space narrowing and spurring medially. On the lateral view, spurring posteriorly on the femoral and tibial condyles. Significant patellofemoral degenerative change, lateral facet arthritis, and spurring are present. Varus deformity is observed.  X-ray Impression  Moderate right knee medial and patellofemoral degenerative changes.   Assessment: ICD-10-CM  1. Primary osteoarthritis of right knee  M17.11  2. Acute pain of right knee M25.561  3. Diabetes mellitus without complication (CMS/HHS-HCC) E11.9   Plan:  The patient has clinical findings of moderate right knee medial and patellofemoral degenerative changes.  The patient's symptoms and x-ray findings indicate moderate right knee medial and patellofemoral degenerative changes. A cortisone injection will be administered today. If the cortisone injection does not provide relief,  alternative treatment options will be considered. Physical therapy to build up muscle strength was discussed as a potential treatment to help alleviate pain and improve knee stability.  Knee Injection: The skin was first prepped with chlorhexidine inferolaterally. Ethyl Chloride spray was then applied. A 25 gauge needle was inserted and 1 cc Betamethasone and 3 cc Marcaine was injected. The patient tolerated this well. A Band-Aid was applied.  PAIN:  Are you having pain? Yes: NPRS scale: 2/10 Pain location: R knee (medial aspect) Pain description: aching/ sharp Aggravating factors: standing/ walking Relieving factors: meds/ injection  PRECAUTIONS: Fall  RED FLAGS: None   WEIGHT BEARING RESTRICTIONS: No  FALLS:  Has patient fallen in last 6 months? Yes. 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  Pt. Has applied for disability (pending)- denied 3 times.    PLOF: Requires assistive device for independence  PATIENT GOALS: Increase R knee strength/ improve pain-free mobility.    NEXT MD VISIT: PRN  OBJECTIVE:  Note: Objective measures were completed at Evaluation unless otherwise noted.  DIAGNOSTIC FINDINGS:  Narrative  05/23/2023 2:05 PM EST   AP, lateral, and oblique x-rays of the right knee were ordered and personally reviewed today. These show medial compartment moderate osteoarthritis with joint space narrowing and spurring medially. On the lateral view, spurring posteriorly on the femoral and tibial condyles. Significant patellofemoral degenerative change, lateral facet arthritis, and spurring are present. Varus deformity is observed.  X-ray Impression Moderate right knee medial and patellofemoral degenerative changes.     PATIENT SURVEYS:  FOTO  initial 45/ goal 61  COGNITION: Overall cognitive status: Within functional limits for tasks assessed     SENSATION: WFL  EDEMA:  No swelling noted.  Unable to compare to L knee secondary to AKA.    MUSCLE LENGTH: Hamstrings: Right WFL; Left WFL  POSTURE: rounded shoulders  PALPATION: (+) R medial knee joint line tenderness  LOWER EXTREMITY ROM:  Active ROM Right eval Left eval  Hip flexion    Hip extension    Hip abduction    Hip adduction    Hip internal rotation    Hip external rotation    Knee flexion 126 deg.   Knee extension 0 deg.    Ankle dorsiflexion    Ankle plantarflexion    Ankle inversion    Ankle eversion     (Blank rows = not tested)  LOWER EXTREMITY MMT:  MMT Right eval Left eval  Hip flexion 4/5 4-/5  Hip extension    Hip abduction 4/5 4/5  Hip adduction    Hip internal rotation    Hip external rotation    Knee flexion 4/5 (pain)   Knee extension 4/5   Ankle dorsiflexion 5/5   Ankle plantarflexion    Ankle inversion    Ankle eversion     (Blank rows = not tested)  LOWER EXTREMITY SPECIAL TESTS:  Knee  special tests: Anterior drawer test: negative and McMurray's test: negative  FUNCTIONAL TESTS:  5 times sit to stand: TBD  GAIT: Distance walked: in clinic Assistive device utilized: Single point cane Level of assistance: Modified independence Comments: L prosthetic leg with good knee mechanism control.  R knee varus during stance phase of gait with slight R antalgic gait.  Increase c/o R knee discomfort with increase distance walked.                                                                                                                                TREATMENT DATE: 08/01/2023  Subjective:  Pt. Reports 6/10 R knee pain (joint line) prior to tx. Session.  Pt. Started taking Lyrica since last Friday and MD reports the medication may take 2 weeks to work.  Pt. Scheduled to return to MD in 6 months.     There.ex.:  Nustep L4  B UE/LE for 10 minutes.  Discussed weekend activities/ HEP.    Reviewed HEP/ pt. Requires verbal cuing for proper technique.    Manual tx.:  Seated R knee AP/PA mobs (grade III-IV) 3x20 seconds.  Seated R knee distraction at edge of table (static holds/ pain tolerable).    Supine R hamstring 60 deg. To 78 deg. (Contract-relax technique used to increase ROM).    Supine medial/lateral glides (grade II-III) at R proximal tibia 3x20 sec.  R hip/knee LAD (guarded movement).    PATIENT EDUCATION:  Education details: Reviewed HEP Person educated: Patient Education method: Explanation, Demonstration, and Handouts Education comprehension: verbalized understanding and returned demonstration  HOME EXERCISE PROGRAM: Access Code: [email or web address] URL: [email or web address] Date: 07/21/2023 Prepared by: Dorene Grebe  Exercises - Straight Leg Raise  - 1 x daily - 7 x weekly - 2 sets - 20 reps - Sidelying Hip Abduction  - 1 x daily - 7 x weekly - 2 sets - 20 reps - Supine Quad Set  - 1 x daily - 7 x weekly - 2 sets - 10 reps - Seated March with Resistance  - 1 x daily - 7 x weekly - 2 sets - 10 reps - Seated Hip Abduction with Resistance  - 1 x daily - 7 x weekly - 2 sets - 10 reps  ASSESSMENT:  CLINICAL IMPRESSION: Pt. Presents with good R knee ROM but B hip/ R knee strength deficits.  Pt. Has (+) tenderness along R medial knee with palpation and antalgic gait pattern.  Pt. Limited with R knee manual tx. In seated/supine position due to R knee pain/ muscle guarding.  Pt. Instructed in the importance of completing HEP on a consistent basis.  Pt. Will benefit from skilled PT services to increase R LE strength and improve pain-free mobility with standing/ walking tasks.   OBJECTIVE IMPAIRMENTS: Abnormal gait, decreased activity tolerance, decreased balance, decreased endurance, decreased mobility, difficulty walking, decreased strength, hypomobility, improper body mechanics, and pain.    ACTIVITY LIMITATIONS: standing, squatting, stairs,  transfers, locomotion level, and caring for others  PARTICIPATION LIMITATIONS: cleaning and community activity  PERSONAL FACTORS: Fitness and Past/current experiences are also affecting patient's functional outcome.   REHAB POTENTIAL: Good  CLINICAL DECISION MAKING: Evolving/moderate complexity  EVALUATION COMPLEXITY: Moderate   GOALS: Goals reviewed with patient? Yes  SHORT TERM GOALS: Target date: 08/11/23 Pt. Independent with HEP to increase R hip/knee strength 1/2 muscle grade to improve pain-free mobility.   Baseline:  see above Goal status: INITIAL  LONG TERM GOALS: Target date: 09/01/23  Pt. Will increase FOTO to 55 to improve pain-free mobility.   Baseline: initial 45 Goal status: INITIAL  2.  Pt. Able to ambulate community distance with use of SPC and no increase c/o R knee pain.   Baseline: 7/10 R knee pain with walking Goal status: INITIAL  3.  Pt. Able to stand from chair with no increase c/o R knee pain to improve pain-free mobility.  Baseline: marked increase in R knee pain.   Goal status: INITIAL   PLAN:  PT FREQUENCY: 2x/week  PT DURATION: 6 weeks  PLANNED INTERVENTIONS: 97110-Therapeutic exercises, 97530- Therapeutic activity, [id number]- Neuromuscular re-education, 97535- Self Care, 29562- Manual therapy, [id number]- Gait training, 97014- Electrical stimulation (unattended), Patient/Family education, Balance training, Stair training, Dry Needling, Joint mobilization, Cryotherapy, and Moist heat  PLAN FOR NEXT SESSION: reassess HEP/ progress quad strengthening.    Cammie Mcgee, PT, DPT # [phone] 08/01/2023, 10:58 AM

## 2023-08-03 ENCOUNTER — Encounter: Payer: Medicaid Other | Admitting: Physical Therapy

## 2023-08-08 ENCOUNTER — Ambulatory Visit: Payer: Medicaid Other | Admitting: Physical Therapy

## 2023-08-10 ENCOUNTER — Telehealth (INDEPENDENT_AMBULATORY_CARE_PROVIDER_SITE_OTHER): Payer: Self-pay

## 2023-08-10 ENCOUNTER — Other Ambulatory Visit (INDEPENDENT_AMBULATORY_CARE_PROVIDER_SITE_OTHER): Payer: Self-pay | Admitting: Nurse Practitioner

## 2023-08-10 ENCOUNTER — Encounter: Payer: Medicaid Other | Admitting: Physical Therapy

## 2023-08-10 MED ORDER — PREGABALIN 150 MG PO CAPS: 150.0000 mg | ORAL_CAPSULE | Freq: Two times a day (BID) | ORAL | 0 refills | Status: DC

## 2023-08-10 NOTE — Telephone Encounter (Signed)
Patient left a message requesting for increase dosage for Pregabalin to help with pain. Please Advise

## 2023-08-10 NOTE — Telephone Encounter (Signed)
I have upped it to 150 mg twice daily.  She should take this for the next two weeks and call us if she's still having pain and we can discuss whether to increase again or add something else

## 2023-08-10 NOTE — Telephone Encounter (Signed)
Patient notified with medical recommendations and verbalized understanding

## 2023-08-22 ENCOUNTER — Ambulatory Visit: Payer: Medicaid Other | Attending: Orthopedic Surgery | Admitting: Physical Therapy

## 2023-08-22 ENCOUNTER — Encounter: Payer: Self-pay | Admitting: Physical Therapy

## 2023-08-22 DIAGNOSIS — R269 Unspecified abnormalities of gait and mobility: Secondary | ICD-10-CM | POA: Diagnosis present

## 2023-08-22 DIAGNOSIS — G8929 Other chronic pain: Secondary | ICD-10-CM | POA: Diagnosis present

## 2023-08-22 DIAGNOSIS — M6281 Muscle weakness (generalized): Secondary | ICD-10-CM | POA: Diagnosis present

## 2023-08-22 DIAGNOSIS — M25561 Pain in right knee: Secondary | ICD-10-CM | POA: Diagnosis present

## 2023-08-22 DIAGNOSIS — Z89612 Acquired absence of left leg above knee: Secondary | ICD-10-CM | POA: Diagnosis present

## 2023-08-22 NOTE — Therapy (Signed)
 OUTPATIENT PHYSICAL THERAPY LOWER EXTREMITY TREATMENT  Patient Name: Jocelyn [name] MRN: [id number] DOB:12-19-1980, 43 y.o., female Today's Date: 08/22/2023  END OF SESSION:  PT End of Session - 08/22/23 1101     Visit Number 3    Number of Visits 12    Date for PT Re-Evaluation 09/01/23    PT Start Time 1111    PT Stop Time 1159    PT Time Calculation (min) 48 min    Equipment Utilized During Treatment Gait belt    Activity Tolerance Patient tolerated treatment well    Behavior During Therapy WFL for tasks assessed/performed            Past Medical History:  Diagnosis Date   IDA (iron  deficiency anemia) 12/17/2019   Past Surgical History:  Procedure Laterality Date   AMPUTATION Left 01/20/2022   Procedure: AMPUTATION ABOVE KNEE;  Surgeon: Celso College, MD;  Location: [facility];  Service: Vascular;  Laterality: Left;   APPENDECTOMY     LOWER EXTREMITY ANGIOGRAPHY Left 01/11/2020   Procedure: Lower Extremity Angiography;  Surgeon: Celso College, MD;  Location: [facility];  Service: Cardiovascular;  Laterality: Left;   LOWER EXTREMITY ANGIOGRAPHY Left 03/06/2020   Procedure: Lower Extremity Angiography;  Surgeon: Celso College, MD;  Location: [facility];  Service: Cardiovascular;  Laterality: Left;   LOWER EXTREMITY ANGIOGRAPHY Left 03/07/2020   Procedure: Lower Extremity Angiography;  Surgeon: Celso College, MD;  Location: [facility];  Service: Cardiovascular;  Laterality: Left;   LOWER EXTREMITY ANGIOGRAPHY Left 01/06/2022   Procedure: Lower Extremity Angiography;  Surgeon: Celso College, MD;  Location: [facility];  Service: Cardiovascular;  Laterality: Left;   LOWER EXTREMITY ANGIOGRAPHY Left 01/07/2022   Procedure: Lower Extremity Angiography;  Surgeon: Celso College, MD;  Location: [facility];  Service: Cardiovascular;  Laterality: Left;   LOWER EXTREMITY ANGIOGRAPHY Left 01/18/2022   Procedure: Lower Extremity Angiography;  Surgeon:  Celso College, MD;  Location: [facility];  Service: Cardiovascular;  Laterality: Left;   LOWER EXTREMITY INTERVENTION Bilateral 01/17/2022   Procedure: LOWER EXTREMITY INTERVENTION;  Surgeon: Guss Legacy, MD;  Location: [facility];  Service: Cardiovascular;  Laterality: Bilateral;   PERIPHERAL VASCULAR THROMBECTOMY Left 01/20/2022   Procedure: PERIPHERAL VASCULAR THROMBECTOMY Left Femoral vein;  Surgeon: Celso College, MD;  Location: [facility];  Service: Vascular;  Laterality: Left;   Patient Active Problem List   Diagnosis Date Noted   S/P AKA (above knee amputation) unilateral, left (HCC) 01/25/2022   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   Acute urinary retention 01/19/2022   Fever 01/18/2022   Acute deep vein thrombosis (DVT) of left femoral vein (HCC) 01/17/2022   Hypomagnesemia 01/06/2022   Hyponatremia 01/06/2022   Critical limb ischemia of left lower extremity (HCC) 01/10/2020   Diabetes mellitus without complication (HCC) 12/17/2019   IDA (iron  deficiency anemia) 12/17/2019   HTN (hypertension) 12/17/2019   REFERRING PROVIDER: Molli Angelucci, MD  REFERRING DIAG: Primary osteoarthritis of right knee  THERAPY DIAG:  Chronic pain of right knee  Muscle weakness (generalized)  Gait difficulty  S/P AKA (above knee amputation) unilateral, left (HCC)  Rationale for Evaluation and Treatment: Rehabilitation  ONSET DATE: Chronic  SUBJECTIVE:   SUBJECTIVE STATEMENT: Pt. Reports acute on chronic R knee pain after falling in November.  Pt. Reports 2/10 R knee pain currently at rest and 7/10 at worst with increase walking/ activity.  Pt. Received  cortisone injection in R knee with benefit.  Pt. Known well to PT clinic after complete PT for L AKA/ prosthetic training.    PERTINENT HISTORY: The patient is a 43 year old female who presents for evaluation of right leg pain.  She underwent a left above-knee amputation and she as advised her right knee pain was due to the  inability to fully bear weight on the left leg. She continues to experience swelling and instability in her knee, leading to her referral here.   The patient also experiences numbness in her right foot which extends to her leg, which she attributes to prolonged standing. She was not going to therapy but he has been discharged. The patient has not received a cortisone injection and is not diabetic.  She developed blood clots in the left lower extremity after the COVID-19 vaccine.  The patient is a smoker.   I have reviewed past medical, surgical, social and family history, and allergies as documented in the EMR.  Past Medical History: Past Medical History:  Diagnosis Date  Hyperlipidemia  Hypertension   Past Surgical History: History reviewed. No pertinent surgical history.  Past Family History: History reviewed. No pertinent family history.  Medications: Current Outpatient Medications  Medication Sig Dispense Refill  acetaminophen  (TYLENOL ) 325 MG tablet Take 650 mg by mouth every 4 (four) hours as needed for Pain  amLODIPine  (NORVASC ) 10 MG tablet Take 1 tablet by mouth once daily  apixaban  (ELIQUIS ) 5 mg tablet Take 5 mg by mouth 2 (two) times daily  atorvastatin (LIPITOR) 20 MG tablet Take 1 tablet by mouth once daily  ferrous sulfate  325 (65 FE) MG tablet Take 325 mg by mouth once daily  gabapentin  (NEURONTIN ) 600 MG tablet Take 600 mg by mouth 3 (three) times daily  hydroCHLOROthiazide  (HYDRODIURIL ) 25 MG tablet Take 1 tablet by mouth once daily   Current Facility-Administered Medications  Medication Dose Route Frequency Provider Last Rate Last Admin  betamethasone acetate-betamethasone sodium phosphate  (CELESTONE) injection 6 mg 6 mg Intra-articular Once Menz, Malai Lady Joseph, MD  BUPivacaine HCl (MARCAINE) 0.5 % injection 2 mL 2 mL Intra-articular Once Menz, Myrian Botello Joseph, MD   Allergies: No Known Allergies   Body mass index is 26.97 kg/m.  Review of Systems: A  comprehensive 14 point ROS was performed, reviewed, and the pertinent orthopaedic findings are documented in the HPI.  Vitals:  05/23/23 0821  BP: 136/78    General Physical Examination:   General/Constitutional: No apparent distress: well-nourished and well developed. Eyes: Pupils equal, round with synchronous movement. Lungs: Clear to auscultation HEENT: Normal Vascular: No edema, swelling or tenderness, except as noted in detailed exam. Cardiac: Heart rate and rhythm is regular. Integumentary: No impressive skin lesions present, except as noted in detailed exam. Neuro/Psych: Normal mood and affect, oriented to person, place, and time.  Musculoskeletal: Patellofemoral crepitation is present in the medial compartment of the right knee. Varus deformity is passively correctable. No Baker cyst is present. Right hip has 60 degrees internal rotation and 30 degrees of external rotation.  Radiographs:  AP, lateral, and oblique x-rays of the right knee were ordered and personally reviewed today. These show medial compartment moderate osteoarthritis with joint space narrowing and spurring medially. On the lateral view, spurring posteriorly on the femoral and tibial condyles. Significant patellofemoral degenerative change, lateral facet arthritis, and spurring are present. Varus deformity is observed.  X-ray Impression  Moderate right knee medial and patellofemoral degenerative changes.   Assessment: ICD-10-CM  1. Primary osteoarthritis of right knee  M17.11  2. Acute pain of right knee M25.561  3. Diabetes mellitus without complication (CMS/HHS-HCC) E11.9   Plan:  The patient has clinical findings of moderate right knee medial and patellofemoral degenerative changes.  The patient's symptoms and x-ray findings indicate moderate right knee medial and patellofemoral degenerative changes. A cortisone injection will be administered today. If the cortisone injection does not provide relief,  alternative treatment options will be considered. Physical therapy to build up muscle strength was discussed as a potential treatment to help alleviate pain and improve knee stability.  Knee Injection: The skin was first prepped with chlorhexidine  inferolaterally. Ethyl Chloride spray was then applied. A 25 gauge needle was inserted and 1 cc Betamethasone and 3 cc Marcaine was injected. The patient tolerated this well. A Band-Aid was applied.  PAIN:  Are you having pain? Yes: NPRS scale: 2/10 Pain location: R knee (medial aspect) Pain description: aching/ sharp Aggravating factors: standing/ walking Relieving factors: meds/ injection  PRECAUTIONS: Fall  RED FLAGS: None   WEIGHT BEARING RESTRICTIONS: No  FALLS:  Has patient fallen in last 6 months? Yes. 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  Pt. Has applied for disability (pending)- denied 3 times.    PLOF: Requires assistive device for independence  PATIENT GOALS: Increase R knee strength/ improve pain-free mobility.    NEXT MD VISIT: PRN  OBJECTIVE:  Note: Objective measures were completed at Evaluation unless otherwise noted.  DIAGNOSTIC FINDINGS:  Narrative  05/23/2023 2:05 PM EST   AP, lateral, and oblique x-rays of the right knee were ordered and personally reviewed today. These show medial compartment moderate osteoarthritis with joint space narrowing and spurring medially. On the lateral view, spurring posteriorly on the femoral and tibial condyles. Significant patellofemoral degenerative change, lateral facet arthritis, and spurring are present. Varus deformity is observed.  X-ray Impression Moderate right knee medial and patellofemoral degenerative changes.     PATIENT SURVEYS:  FOTO  initial 45/ goal 43  COGNITION: Overall cognitive status: Within functional limits for tasks assessed     SENSATION: WFL  EDEMA:  No swelling noted.  Unable to compare to L knee secondary to AKA.    MUSCLE LENGTH: Hamstrings: Right WFL; Left WFL  POSTURE: rounded shoulders  PALPATION: (+) R medial knee joint line tenderness  LOWER EXTREMITY ROM:  Active ROM Right eval Left eval  Hip flexion    Hip extension    Hip abduction    Hip adduction    Hip internal rotation    Hip external rotation    Knee flexion 126 deg.   Knee extension 0 deg.    Ankle dorsiflexion    Ankle plantarflexion    Ankle inversion    Ankle eversion     (Blank rows = not tested)  LOWER EXTREMITY MMT:  MMT Right eval Left eval  Hip flexion 4/5 4-/5  Hip extension    Hip abduction 4/5 4/5  Hip adduction    Hip internal rotation    Hip external rotation    Knee flexion 4/5 (pain)   Knee extension 4/5   Ankle dorsiflexion 5/5   Ankle plantarflexion    Ankle inversion    Ankle eversion     (Blank rows = not tested)  LOWER EXTREMITY SPECIAL TESTS:  Knee  special tests: Anterior drawer test: negative and McMurray's test: negative  FUNCTIONAL TESTS:  5 times sit to stand: TBD  GAIT: Distance walked: in clinic Assistive device utilized: Single point cane Level of assistance: Modified independence Comments: L prosthetic leg with good knee mechanism control.  R knee varus during stance phase of gait with slight R antalgic gait.  Increase c/o R knee discomfort with increase distance walked.                                                                                                                                TREATMENT DATE: 08/22/2023  Subjective:  Pt. reports 2/10 R knee pain (joint line) prior to tx. Session.  Pt. Has continued taking Lyrica  with some benefit reported.  Pt. Took 1 Meloxicam yesterday secondary to pain and states she had a marked decrease in pain.  Pt. Has fallen 2x  since last PT visit while walking in home.  Pt. Scheduled to return to MD in 5 months.   There.ex.:  Nustep L4 B UE/LE for 10 minutes.  Discussed weekend activities/ HEP.    Reviewed HEP and added seated hamstring stretches (handout given)/ pt. Requires verbal cuing for proper technique.    Manual tx.:  Seated R knee AP/PA mobs (grade III-IV) 3x20 seconds.  Seated R knee distraction at edge of table (static holds/ pain tolerable).    Supine R hamstring To 80 deg. (Contract-relax technique used to increase ROM).    Supine medial/lateral glides (grade II-III) at R proximal tibia 3x20 sec.  R hip/knee LAD (guarded movement).    PATIENT EDUCATION:  Education details: Reviewed HEP Person educated: Patient Education method: Explanation, Demonstration, and Handouts Education comprehension: verbalized understanding and returned demonstration  HOME EXERCISE PROGRAM: Access Code: [email or web address] URL: [email or web address] Date: 07/21/2023 Prepared by: Hazeline Lister  Exercises - Straight Leg Raise  - 1 x daily - 7 x weekly - 2 sets - 20 reps - Sidelying Hip Abduction  - 1 x daily - 7 x weekly - 2 sets - 20 reps - Supine Quad Set  - 1 x daily - 7 x weekly - 2 sets - 10 reps - Seated March with Resistance  - 1 x daily - 7 x weekly - 2 sets - 10 reps - Seated Hip Abduction with Resistance  - 1 x daily - 7 x weekly - 2 sets - 10 reps  Added seated hamstring stretches on 08/22/23.      ASSESSMENT:  CLINICAL IMPRESSION: Pt. Presents with good R knee ROM but B hip/ R knee strength deficits.  Pt. Has (+) tenderness along R medial knee with palpation during manual tx.  Pt. Limited with R knee manual tx. In seated/supine position due to R knee pain/ muscle guarding.  Pt. Instructed in the importance of completing HEP on a consistent basis and PT added seated hamstring stretches.  Pt. Will benefit from skilled PT services to increase R LE strength  and improve pain-free mobility with standing/  walking tasks.   OBJECTIVE IMPAIRMENTS: Abnormal gait, decreased activity tolerance, decreased balance, decreased endurance, decreased mobility, difficulty walking, decreased strength, hypomobility, improper body mechanics, and pain.   ACTIVITY LIMITATIONS: standing, squatting, stairs, transfers, locomotion level, and caring for others  PARTICIPATION LIMITATIONS: cleaning and community activity  PERSONAL FACTORS: Fitness and Past/current experiences are also affecting patient's functional outcome.   REHAB POTENTIAL: Good  CLINICAL DECISION MAKING: Evolving/moderate complexity  EVALUATION COMPLEXITY: Moderate   GOALS: Goals reviewed with patient? Yes  SHORT TERM GOALS: Target date: 08/11/23 Pt. Independent with HEP to increase R hip/knee strength 1/2 muscle grade to improve pain-free mobility.   Baseline:  see above Goal status: On-going  LONG TERM GOALS: Target date: 09/01/23  Pt. Will increase FOTO to 55 to improve pain-free mobility.   Baseline: initial 45 Goal status: INITIAL  2.  Pt. Able to ambulate community distance with use of SPC and no increase c/o R knee pain.   Baseline: 7/10 R knee pain with walking Goal status: INITIAL  3.  Pt. Able to stand from chair with no increase c/o R knee pain to improve pain-free mobility.  Baseline: marked increase in R knee pain.   Goal status: INITIAL   PLAN:  PT FREQUENCY: 2x/week  PT DURATION: 6 weeks  PLANNED INTERVENTIONS: 97110-Therapeutic exercises, 97530- Therapeutic activity, [id number]- Neuromuscular re-education, 97535- Self Care, 16109- Manual therapy, [phone]- Gait training, 97014- Electrical stimulation (unattended), Patient/Family education, Balance training, Stair training, Dry Needling, Joint mobilization, Cryotherapy, and Moist heat  PLAN FOR NEXT SESSION: progress quad strengthening.    Lendell Quarry, PT, DPT # [id number] 08/22/2023, 12:17 PM

## 2023-08-26 ENCOUNTER — Ambulatory Visit
Admission: RE | Admit: 2023-08-26 | Discharge: 2023-08-26 | Disposition: A | Discharge status: Home / Self Care | Payer: Medicaid Other | Source: Ambulatory Visit | Attending: Nurse Practitioner | Admitting: Nurse Practitioner

## 2023-08-26 DIAGNOSIS — Z1231 Encounter for screening mammogram for malignant neoplasm of breast: Secondary | ICD-10-CM | POA: Diagnosis present

## 2023-09-02 ENCOUNTER — Ambulatory Visit: Payer: Medicaid Other | Admitting: Physical Therapy

## 2023-09-07 ENCOUNTER — Ambulatory Visit: Payer: Medicaid Other | Admitting: Physical Therapy

## 2023-09-07 ENCOUNTER — Encounter: Payer: Self-pay | Admitting: Physical Therapy

## 2023-09-07 DIAGNOSIS — M6281 Muscle weakness (generalized): Secondary | ICD-10-CM

## 2023-09-07 DIAGNOSIS — Z89612 Acquired absence of left leg above knee: Secondary | ICD-10-CM

## 2023-09-07 DIAGNOSIS — M25561 Pain in right knee: Secondary | ICD-10-CM | POA: Diagnosis not present

## 2023-09-07 DIAGNOSIS — R269 Unspecified abnormalities of gait and mobility: Secondary | ICD-10-CM

## 2023-09-07 DIAGNOSIS — G8929 Other chronic pain: Secondary | ICD-10-CM

## 2023-09-07 NOTE — Therapy (Signed)
 OUTPATIENT PHYSICAL THERAPY LOWER EXTREMITY TREATMENT/ RECERTIFICATION  Patient Name: Jocelyn [name] MRN: [id number] DOB:07-Dec-1980, 43 y.o., female Today's Date: 09/07/2023  END OF SESSION:  PT End of Session - 09/07/23 0816     Visit Number 4    Number of Visits 8    Date for PT Re-Evaluation 10/05/23    PT Start Time 0810    PT Stop Time 0858    PT Time Calculation (min) 48 min    Equipment Utilized During Treatment Gait belt    Activity Tolerance Patient tolerated treatment well    Behavior During Therapy WFL for tasks assessed/performed            Past Medical History:  Diagnosis Date   IDA (iron deficiency anemia) 12/17/2019   Past Surgical History:  Procedure Laterality Date   AMPUTATION Left 01/20/2022   Procedure: AMPUTATION ABOVE KNEE;  Surgeon: Annice Needy, MD;  Location: [facility];  Service: Vascular;  Laterality: Left;   APPENDECTOMY     LOWER EXTREMITY ANGIOGRAPHY Left 01/11/2020   Procedure: Lower Extremity Angiography;  Surgeon: Annice Needy, MD;  Location: [facility];  Service: Cardiovascular;  Laterality: Left;   LOWER EXTREMITY ANGIOGRAPHY Left 03/06/2020   Procedure: Lower Extremity Angiography;  Surgeon: Annice Needy, MD;  Location: [facility];  Service: Cardiovascular;  Laterality: Left;   LOWER EXTREMITY ANGIOGRAPHY Left 03/07/2020   Procedure: Lower Extremity Angiography;  Surgeon: Annice Needy, MD;  Location: [facility];  Service: Cardiovascular;  Laterality: Left;   LOWER EXTREMITY ANGIOGRAPHY Left 01/06/2022   Procedure: Lower Extremity Angiography;  Surgeon: Annice Needy, MD;  Location: [facility];  Service: Cardiovascular;  Laterality: Left;   LOWER EXTREMITY ANGIOGRAPHY Left 01/07/2022   Procedure: Lower Extremity Angiography;  Surgeon: Annice Needy, MD;  Location: [facility];  Service: Cardiovascular;  Laterality: Left;   LOWER EXTREMITY ANGIOGRAPHY Left 01/18/2022   Procedure: Lower Extremity  Angiography;  Surgeon: Annice Needy, MD;  Location: [facility];  Service: Cardiovascular;  Laterality: Left;   LOWER EXTREMITY INTERVENTION Bilateral 01/17/2022   Procedure: LOWER EXTREMITY INTERVENTION;  Surgeon: Learta Codding, MD;  Location: [facility];  Service: Cardiovascular;  Laterality: Bilateral;   PERIPHERAL VASCULAR THROMBECTOMY Left 01/20/2022   Procedure: PERIPHERAL VASCULAR THROMBECTOMY Left Femoral vein;  Surgeon: Annice Needy, MD;  Location: [facility];  Service: Vascular;  Laterality: Left;   Patient Active Problem List   Diagnosis Date Noted   S/P AKA (above knee amputation) unilateral, left (HCC) 01/25/2022   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   Acute urinary retention 01/19/2022   Fever 01/18/2022   Acute deep vein thrombosis (DVT) of left femoral vein (HCC) 01/17/2022   Hypomagnesemia 01/06/2022   Hyponatremia 01/06/2022   Critical limb ischemia of left lower extremity (HCC) 01/10/2020   Diabetes mellitus without complication (HCC) 12/17/2019   IDA (iron deficiency anemia) 12/17/2019   HTN (hypertension) 12/17/2019   REFERRING PROVIDER: Kennedy Bucker, MD  REFERRING DIAG: Primary osteoarthritis of right knee  THERAPY DIAG:  Chronic pain of right knee  Muscle weakness (generalized)  Gait difficulty  S/P AKA (above knee amputation) unilateral, left (HCC)  Rationale for Evaluation and Treatment: Rehabilitation  ONSET DATE: Chronic  SUBJECTIVE:   SUBJECTIVE STATEMENT: Pt. Reports acute on chronic R knee pain after falling in November.  Pt. Reports 2/10 R knee pain currently at rest and 7/10 at worst with increase walking/ activity.  Pt.  Received cortisone injection in R knee with benefit.  Pt. Known well to PT clinic after complete PT for L AKA/ prosthetic training.    PERTINENT HISTORY: The patient is a 43 year old female who presents for evaluation of right leg pain.  She underwent a left above-knee amputation and she as advised her right knee  pain was due to the inability to fully bear weight on the left leg. She continues to experience swelling and instability in her knee, leading to her referral here.   The patient also experiences numbness in her right foot which extends to her leg, which she attributes to prolonged standing. She was not going to therapy but he has been discharged. The patient has not received a cortisone injection and is not diabetic.  She developed blood clots in the left lower extremity after the COVID-19 vaccine.  The patient is a smoker.   I have reviewed past medical, surgical, social and family history, and allergies as documented in the EMR.  Past Medical History: Past Medical History:  Diagnosis Date  Hyperlipidemia  Hypertension   Past Surgical History: History reviewed. No pertinent surgical history.  Past Family History: History reviewed. No pertinent family history.  Medications: Current Outpatient Medications  Medication Sig Dispense Refill  acetaminophen (TYLENOL) 325 MG tablet Take 650 mg by mouth every 4 (four) hours as needed for Pain  amLODIPine (NORVASC) 10 MG tablet Take 1 tablet by mouth once daily  apixaban (ELIQUIS) 5 mg tablet Take 5 mg by mouth 2 (two) times daily  atorvastatin (LIPITOR) 20 MG tablet Take 1 tablet by mouth once daily  ferrous sulfate 325 (65 FE) MG tablet Take 325 mg by mouth once daily  gabapentin (NEURONTIN) 600 MG tablet Take 600 mg by mouth 3 (three) times daily  hydroCHLOROthiazide (HYDRODIURIL) 25 MG tablet Take 1 tablet by mouth once daily   Current Facility-Administered Medications  Medication Dose Route Frequency Provider Last Rate Last Admin  betamethasone acetate-betamethasone sodium phosphate (CELESTONE) injection 6 mg 6 mg Intra-articular Once Marlena Clipper, MD  BUPivacaine HCl (MARCAINE) 0.5 % injection 2 mL 2 mL Intra-articular Once Marlena Clipper, MD   Allergies: No Known Allergies   Body mass index is 26.97  kg/m.  Review of Systems: A comprehensive 14 point ROS was performed, reviewed, and the pertinent orthopaedic findings are documented in the HPI.  Vitals:  05/23/23 0821  BP: 136/78    General Physical Examination:   General/Constitutional: No apparent distress: well-nourished and well developed. Eyes: Pupils equal, round with synchronous movement. Lungs: Clear to auscultation HEENT: Normal Vascular: No edema, swelling or tenderness, except as noted in detailed exam. Cardiac: Heart rate and rhythm is regular. Integumentary: No impressive skin lesions present, except as noted in detailed exam. Neuro/Psych: Normal mood and affect, oriented to person, place, and time.  Musculoskeletal: Patellofemoral crepitation is present in the medial compartment of the right knee. Varus deformity is passively correctable. No Baker cyst is present. Right hip has 60 degrees internal rotation and 30 degrees of external rotation.  Radiographs:  AP, lateral, and oblique x-rays of the right knee were ordered and personally reviewed today. These show medial compartment moderate osteoarthritis with joint space narrowing and spurring medially. On the lateral view, spurring posteriorly on the femoral and tibial condyles. Significant patellofemoral degenerative change, lateral facet arthritis, and spurring are present. Varus deformity is observed.  X-ray Impression  Moderate right knee medial and patellofemoral degenerative changes.   Assessment: ICD-10-CM  1. Primary osteoarthritis of right  knee M17.11  2. Acute pain of right knee M25.561  3. Diabetes mellitus without complication (CMS/HHS-HCC) E11.9   Plan:  The patient has clinical findings of moderate right knee medial and patellofemoral degenerative changes.  The patient's symptoms and x-ray findings indicate moderate right knee medial and patellofemoral degenerative changes. A cortisone injection will be administered today. If the cortisone  injection does not provide relief, alternative treatment options will be considered. Physical therapy to build up muscle strength was discussed as a potential treatment to help alleviate pain and improve knee stability.  Knee Injection: The skin was first prepped with chlorhexidine inferolaterally. Ethyl Chloride spray was then applied. A 25 gauge needle was inserted and 1 cc Betamethasone and 3 cc Marcaine was injected. The patient tolerated this well. A Band-Aid was applied.  PAIN:  Are you having pain? Yes: NPRS scale: 2/10 Pain location: R knee (medial aspect) Pain description: aching/ sharp Aggravating factors: standing/ walking Relieving factors: meds/ injection  PRECAUTIONS: Fall  RED FLAGS: None   WEIGHT BEARING RESTRICTIONS: No  FALLS:  Has patient fallen in last 6 months? Yes. 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  Pt. Has applied for disability (pending)- denied 3 times.    PLOF: Requires assistive device for independence  PATIENT GOALS: Increase R knee strength/ improve pain-free mobility.    NEXT MD VISIT: PRN  OBJECTIVE:  Note: Objective measures were completed at Evaluation unless otherwise noted.  DIAGNOSTIC FINDINGS:  Narrative  05/23/2023 2:05 PM EST   AP, lateral, and oblique x-rays of the right knee were ordered and personally reviewed today. These show medial compartment moderate osteoarthritis with joint space narrowing and spurring medially. On the lateral view, spurring posteriorly on the femoral and tibial condyles. Significant patellofemoral degenerative change, lateral facet arthritis, and spurring are present. Varus deformity is observed.  X-ray Impression Moderate right knee medial and patellofemoral degenerative  changes.     PATIENT SURVEYS:  FOTO initial 45/ goal 5  COGNITION: Overall cognitive status: Within functional limits for tasks assessed     SENSATION: WFL  EDEMA:  No swelling noted.  Unable to compare to L knee secondary to AKA.    MUSCLE LENGTH: Hamstrings: Right WFL; Left WFL  POSTURE: rounded shoulders  PALPATION: (+) R medial knee joint line tenderness  LOWER EXTREMITY ROM:  Active ROM Right eval Left eval  Hip flexion    Hip extension    Hip abduction    Hip adduction    Hip internal rotation    Hip external rotation    Knee flexion 126 deg.   Knee extension 0 deg.    Ankle dorsiflexion    Ankle plantarflexion    Ankle inversion    Ankle eversion     (Blank rows = not tested)  LOWER EXTREMITY MMT:  MMT Right eval Left eval  Hip flexion 4/5 4-/5  Hip extension    Hip abduction 4/5 4/5  Hip adduction    Hip internal rotation    Hip external rotation    Knee flexion 4/5 (pain)   Knee extension 4/5   Ankle dorsiflexion 5/5   Ankle plantarflexion    Ankle inversion    Ankle eversion     (Blank rows = not tested)  LOWER EXTREMITY SPECIAL TESTS:  Knee special tests: Anterior drawer test: negative and McMurray's test: negative  FUNCTIONAL TESTS:  5 times sit to stand: TBD  GAIT: Distance walked: in clinic Assistive device utilized: Single point cane Level of assistance: Modified independence Comments: L prosthetic leg with good knee mechanism control.  R knee varus during stance phase of gait with slight R antalgic gait.  Increase c/o R knee discomfort with increase distance walked.                                                                                                                                TREATMENT DATE: 09/07/2023  Subjective:  Pt. reports 2/10 R knee pain (joint line) prior to tx. Session.  Pt. Has continued taking Lyrica with some benefit reported.  No falls reported since last tx. Session.  Pt. Scheduled to have an  injection in R knee today with Dr. Rosita Kea at 10am.    There.ex.:  Nustep L4 B UE/LE for 10 minutes.  Discussed R knee symptoms and walking at home.     Supine SLR/ bridging 10x each.  Slight quad lag on R LE.    Reviewed HEP (handouts).    Manual tx.:  Seated R knee AP/PA mobs (grade III-IV) 3x20 seconds.  Seated R knee distraction at edge of table (static holds/ pain tolerable).  Supine R proximal tibia AP mobs (grade IV) 3x20 sec.    Supine R hamstring To 80 deg. (Contract-relax technique used to increase ROM).  Supine R hip stretches (all planes)- focus on IR/ER.    Supine R hip/knee LAD (more relaxed with distraction today).     PATIENT EDUCATION:  Education details: Reviewed HEP Person educated: Patient Education method: Explanation, Demonstration, and Handouts Education comprehension: verbalized understanding and returned demonstration  HOME EXERCISE PROGRAM: Access Code: [email or web address] URL: [email or web address] Date: 07/21/2023 Prepared by: Dorene Grebe  Exercises - Straight Leg Raise  - 1 x daily - 7 x weekly - 2 sets - 20 reps - Sidelying Hip Abduction  - 1 x daily - 7 x weekly - 2 sets - 20 reps - Supine Quad Set  - 1 x daily - 7 x weekly - 2 sets - 10 reps - Seated March with Resistance  - 1 x daily - 7 x weekly - 2 sets - 10 reps - Seated Hip Abduction with Resistance  - 1 x daily - 7 x weekly - 2 sets - 10 reps  Added seated hamstring stretches on 08/22/23.      ASSESSMENT:  CLINICAL IMPRESSION: Pt. Presents with good R knee ROM but B hip/ R knee strength deficits.  Minimal tenderness along R medial knee with palpation during manual tx.  Pt. Limited with R knee manual tx. In seated/supine position due to R knee pain/ muscle guarding.  Pt. Instructed in the importance of completing HEP on a consistent basis.  Pt. Will benefit from skilled PT services to increase R LE strength and improve  pain-free mobility with standing/ walking tasks.   OBJECTIVE  IMPAIRMENTS: Abnormal gait, decreased activity tolerance, decreased balance, decreased endurance, decreased mobility, difficulty walking, decreased strength, hypomobility, improper body mechanics, and pain.   ACTIVITY LIMITATIONS: standing, squatting, stairs, transfers, locomotion level, and caring for others  PARTICIPATION LIMITATIONS: cleaning and community activity  PERSONAL FACTORS: Fitness and Past/current experiences are also affecting patient's functional outcome.   REHAB POTENTIAL: Good  CLINICAL DECISION MAKING: Evolving/moderate complexity  EVALUATION COMPLEXITY: Moderate   GOALS: Goals reviewed with patient? Yes  LONG TERM GOALS: Target date: 10/05/23  Pt. Independent with HEP to increase R hip/knee strength 1/2 muscle grade to improve pain-free mobility.   Baseline:  see above Goal status: Partially met  2.  Pt. Able to ambulate community distance with use of SPC and no increase c/o R knee pain.   Baseline: 7/10 R knee pain with walking Goal status: Partially met  3.  Pt. Able to stand from chair with no increase c/o R knee pain to improve pain-free mobility.  Baseline: marked increase in R knee pain.   Goal status: Not met  PLAN:  PT FREQUENCY: 2x/week  PT DURATION: 6 weeks  PLANNED INTERVENTIONS: 97110-Therapeutic exercises, 97530- Therapeutic activity, [id number]- Neuromuscular re-education, 97535- Self Care, [id number]- Manual therapy, [phone]- Gait training, [id number]- Electrical stimulation (unattended), Patient/Family education, Balance training, Stair training, Dry Needling, Joint mobilization, Cryotherapy, and Moist heat  PLAN FOR NEXT SESSION:  progress quad strengthening.    Cammie Mcgee, PT, DPT # [phone] 09/07/2023, 9:18 AM

## 2023-09-14 ENCOUNTER — Ambulatory Visit: Payer: Medicaid Other | Admitting: Physical Therapy

## 2023-09-19 ENCOUNTER — Other Ambulatory Visit (INDEPENDENT_AMBULATORY_CARE_PROVIDER_SITE_OTHER): Payer: Self-pay | Admitting: Nurse Practitioner

## 2023-09-21 ENCOUNTER — Ambulatory Visit: Payer: Medicaid Other | Attending: Orthopedic Surgery | Admitting: Physical Therapy

## 2023-09-21 ENCOUNTER — Encounter: Payer: Self-pay | Admitting: Physical Therapy

## 2023-09-21 DIAGNOSIS — M25561 Pain in right knee: Secondary | ICD-10-CM | POA: Insufficient documentation

## 2023-09-21 DIAGNOSIS — Z89612 Acquired absence of left leg above knee: Secondary | ICD-10-CM | POA: Insufficient documentation

## 2023-09-21 DIAGNOSIS — G8929 Other chronic pain: Secondary | ICD-10-CM | POA: Insufficient documentation

## 2023-09-21 DIAGNOSIS — M6281 Muscle weakness (generalized): Secondary | ICD-10-CM | POA: Diagnosis present

## 2023-09-21 DIAGNOSIS — R269 Unspecified abnormalities of gait and mobility: Secondary | ICD-10-CM | POA: Insufficient documentation

## 2023-09-21 NOTE — Therapy (Signed)
 OUTPATIENT PHYSICAL THERAPY LOWER EXTREMITY TREATMENT  Patient Name: Jocelyn [name] MRN: [id number] DOB:20-Feb-1981, 43 y.o., female Today's Date: 09/21/2023  END OF SESSION:  PT End of Session - 09/21/23 0805     Visit Number 5    Number of Visits 8    Date for PT Re-Evaluation 10/05/23    PT Start Time 0805    PT Stop Time 0845    PT Time Calculation (min) 40 min    Equipment Utilized During Treatment Gait belt    Activity Tolerance Patient tolerated treatment well    Behavior During Therapy WFL for tasks assessed/performed            Past Medical History:  Diagnosis Date   IDA (iron deficiency anemia) 12/17/2019   Past Surgical History:  Procedure Laterality Date   AMPUTATION Left 01/20/2022   Procedure: AMPUTATION ABOVE KNEE;  Surgeon: Annice Needy, MD;  Location: [facility];  Service: Vascular;  Laterality: Left;   APPENDECTOMY     LOWER EXTREMITY ANGIOGRAPHY Left 01/11/2020   Procedure: Lower Extremity Angiography;  Surgeon: Annice Needy, MD;  Location: [facility];  Service: Cardiovascular;  Laterality: Left;   LOWER EXTREMITY ANGIOGRAPHY Left 03/06/2020   Procedure: Lower Extremity Angiography;  Surgeon: Annice Needy, MD;  Location: [facility];  Service: Cardiovascular;  Laterality: Left;   LOWER EXTREMITY ANGIOGRAPHY Left 03/07/2020   Procedure: Lower Extremity Angiography;  Surgeon: Annice Needy, MD;  Location: [facility];  Service: Cardiovascular;  Laterality: Left;   LOWER EXTREMITY ANGIOGRAPHY Left 01/06/2022   Procedure: Lower Extremity Angiography;  Surgeon: Annice Needy, MD;  Location: [facility];  Service: Cardiovascular;  Laterality: Left;   LOWER EXTREMITY ANGIOGRAPHY Left 01/07/2022   Procedure: Lower Extremity Angiography;  Surgeon: Annice Needy, MD;  Location: [facility];  Service: Cardiovascular;  Laterality: Left;   LOWER EXTREMITY ANGIOGRAPHY Left 01/18/2022   Procedure: Lower Extremity Angiography;  Surgeon:  Annice Needy, MD;  Location: [facility];  Service: Cardiovascular;  Laterality: Left;   LOWER EXTREMITY INTERVENTION Bilateral 01/17/2022   Procedure: LOWER EXTREMITY INTERVENTION;  Surgeon: Learta Codding, MD;  Location: [facility];  Service: Cardiovascular;  Laterality: Bilateral;   PERIPHERAL VASCULAR THROMBECTOMY Left 01/20/2022   Procedure: PERIPHERAL VASCULAR THROMBECTOMY Left Femoral vein;  Surgeon: Annice Needy, MD;  Location: [facility];  Service: Vascular;  Laterality: Left;   Patient Active Problem List   Diagnosis Date Noted   S/P AKA (above knee amputation) unilateral, left (HCC) 01/25/2022   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   Acute urinary retention 01/19/2022   Fever 01/18/2022   Acute deep vein thrombosis (DVT) of left femoral vein (HCC) 01/17/2022   Hypomagnesemia 01/06/2022   Hyponatremia 01/06/2022   Critical limb ischemia of left lower extremity (HCC) 01/10/2020   Diabetes mellitus without complication (HCC) 12/17/2019   IDA (iron deficiency anemia) 12/17/2019   HTN (hypertension) 12/17/2019   REFERRING PROVIDER: Kennedy Bucker, MD  REFERRING DIAG: Primary osteoarthritis of right knee  THERAPY DIAG:  Chronic pain of right knee  Muscle weakness (generalized)  Gait difficulty  S/P AKA (above knee amputation) unilateral, left (HCC)  Rationale for Evaluation and Treatment: Rehabilitation  ONSET DATE: Chronic  SUBJECTIVE:   SUBJECTIVE STATEMENT: Pt. Reports acute on chronic R knee pain after falling in November.  Pt. Reports 2/10 R knee pain currently at rest and 7/10 at worst with increase walking/ activity.  Pt. Received  cortisone injection in R knee with benefit.  Pt. Known well to PT clinic after complete PT for L AKA/ prosthetic training.    PERTINENT HISTORY: The patient is a 43 year old female who presents for evaluation of right leg pain.  She underwent a left above-knee amputation and she as advised her right knee pain was due to the  inability to fully bear weight on the left leg. She continues to experience swelling and instability in her knee, leading to her referral here.   The patient also experiences numbness in her right foot which extends to her leg, which she attributes to prolonged standing. She was not going to therapy but he has been discharged. The patient has not received a cortisone injection and is not diabetic.  She developed blood clots in the left lower extremity after the COVID-19 vaccine.  The patient is a smoker.   I have reviewed past medical, surgical, social and family history, and allergies as documented in the EMR.  Past Medical History: Past Medical History:  Diagnosis Date  Hyperlipidemia  Hypertension   Past Surgical History: History reviewed. No pertinent surgical history.  Past Family History: History reviewed. No pertinent family history.  Medications: Current Outpatient Medications  Medication Sig Dispense Refill  acetaminophen (TYLENOL) 325 MG tablet Take 650 mg by mouth every 4 (four) hours as needed for Pain  amLODIPine (NORVASC) 10 MG tablet Take 1 tablet by mouth once daily  apixaban (ELIQUIS) 5 mg tablet Take 5 mg by mouth 2 (two) times daily  atorvastatin (LIPITOR) 20 MG tablet Take 1 tablet by mouth once daily  ferrous sulfate 325 (65 FE) MG tablet Take 325 mg by mouth once daily  gabapentin (NEURONTIN) 600 MG tablet Take 600 mg by mouth 3 (three) times daily  hydroCHLOROthiazide (HYDRODIURIL) 25 MG tablet Take 1 tablet by mouth once daily   Current Facility-Administered Medications  Medication Dose Route Frequency Provider Last Rate Last Admin  betamethasone acetate-betamethasone sodium phosphate (CELESTONE) injection 6 mg 6 mg Intra-articular Once Marlena Clipper, MD  BUPivacaine HCl (MARCAINE) 0.5 % injection 2 mL 2 mL Intra-articular Once Marlena Clipper, MD   Allergies: No Known Allergies   Body mass index is 26.97 kg/m.  Review of Systems: A  comprehensive 14 point ROS was performed, reviewed, and the pertinent orthopaedic findings are documented in the HPI.  Vitals:  05/23/23 0821  BP: 136/78   General Physical Examination:   General/Constitutional: No apparent distress: well-nourished and well developed. Eyes: Pupils equal, round with synchronous movement. Lungs: Clear to auscultation HEENT: Normal Vascular: No edema, swelling or tenderness, except as noted in detailed exam. Cardiac: Heart rate and rhythm is regular. Integumentary: No impressive skin lesions present, except as noted in detailed exam. Neuro/Psych: Normal mood and affect, oriented to person, place, and time.  Musculoskeletal: Patellofemoral crepitation is present in the medial compartment of the right knee. Varus deformity is passively correctable. No Baker cyst is present. Right hip has 60 degrees internal rotation and 30 degrees of external rotation.  Radiographs:  AP, lateral, and oblique x-rays of the right knee were ordered and personally reviewed today. These show medial compartment moderate osteoarthritis with joint space narrowing and spurring medially. On the lateral view, spurring posteriorly on the femoral and tibial condyles. Significant patellofemoral degenerative change, lateral facet arthritis, and spurring are present. Varus deformity is observed.  X-ray Impression  Moderate right knee medial and patellofemoral degenerative changes.   Assessment: ICD-10-CM  1. Primary osteoarthritis of right knee M17.11  2. Acute pain of right knee M25.561  3. Diabetes mellitus without complication (CMS/HHS-HCC) E11.9   Plan:  The patient has clinical findings of moderate right knee medial and patellofemoral degenerative changes.  The patient's symptoms and x-ray findings indicate moderate right knee medial and patellofemoral degenerative changes. A cortisone injection will be administered today. If the cortisone injection does not provide relief,  alternative treatment options will be considered. Physical therapy to build up muscle strength was discussed as a potential treatment to help alleviate pain and improve knee stability.  Knee Injection: The skin was first prepped with chlorhexidine inferolaterally. Ethyl Chloride spray was then applied. A 25 gauge needle was inserted and 1 cc Betamethasone and 3 cc Marcaine was injected. The patient tolerated this well. A Band-Aid was applied.  PAIN:  Are you having pain? Yes: NPRS scale: 2/10 Pain location: R knee (medial aspect) Pain description: aching/ sharp Aggravating factors: standing/ walking Relieving factors: meds/ injection  PRECAUTIONS: Fall  RED FLAGS: None   WEIGHT BEARING RESTRICTIONS: No  FALLS:  Has patient fallen in last 6 months? Yes. 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  Pt. Has applied for disability (pending)- denied 3 times.    PLOF: Requires assistive device for independence  PATIENT GOALS: Increase R knee strength/ improve pain-free mobility.    NEXT MD VISIT: PRN  OBJECTIVE:  Note: Objective measures were completed at Evaluation unless otherwise noted.  DIAGNOSTIC FINDINGS:  Narrative  05/23/2023 2:05 PM EST   AP, lateral, and oblique x-rays of the right knee were ordered and personally reviewed today. These show medial compartment moderate osteoarthritis with joint space narrowing and spurring medially. On the lateral view, spurring posteriorly on the femoral and tibial condyles. Significant patellofemoral degenerative change, lateral facet arthritis, and spurring are present. Varus deformity is observed.  X-ray Impression Moderate right knee medial and patellofemoral degenerative changes.    PATIENT SURVEYS:  FOTO  initial 45/ goal 4  COGNITION: Overall cognitive status: Within functional limits for tasks assessed     SENSATION: WFL  EDEMA:  No swelling noted.  Unable to compare to L knee secondary to AKA.    MUSCLE LENGTH: Hamstrings: Right WFL; Left WFL  POSTURE: rounded shoulders  PALPATION: (+) R medial knee joint line tenderness  LOWER EXTREMITY ROM:  Active ROM Right eval Left eval  Hip flexion    Hip extension    Hip abduction    Hip adduction    Hip internal rotation    Hip external rotation    Knee flexion 126 deg.   Knee extension 0 deg.    Ankle dorsiflexion    Ankle plantarflexion    Ankle inversion    Ankle eversion     (Blank rows = not tested)  LOWER EXTREMITY MMT:  MMT Right eval Left eval  Hip flexion 4/5 4-/5  Hip extension    Hip abduction 4/5 4/5  Hip adduction    Hip internal rotation    Hip external rotation    Knee flexion 4/5 (pain)   Knee extension 4/5   Ankle dorsiflexion 5/5   Ankle plantarflexion    Ankle inversion    Ankle eversion     (Blank rows = not tested)  LOWER EXTREMITY SPECIAL TESTS:  Knee special tests: Anterior  drawer test: negative and McMurray's test: negative  FUNCTIONAL TESTS:  5 times sit to stand: TBD  GAIT: Distance walked: in clinic Assistive device utilized: Single point cane Level of assistance: Modified independence Comments: L prosthetic leg with good knee mechanism control.  R knee varus during stance phase of gait with slight R antalgic gait.  Increase c/o R knee discomfort with increase distance walked.                                                                                                                                TREATMENT DATE: 09/21/2023  Subjective:  Pt. Had injection in R knee since last PT tx. Session and reports overall improvement (0/10 pain at this time).  Pt. States MD will only do 1 more injection in R knee in future and then pt. Would need Synvisc injections (no covered by  Medicaid).  Pt. Has continued taking Lyrica with some benefit reported.  No falls reported since last tx. Session.    There.ex.:  Nustep L4 B UE/LE for 10 minutes.  Discussed R knee symptoms and walking at home.  Walking in //-bars without use of UE and focus on consistent step length/ heel strike.  Pt. Has difficulty with head position and looks down to watch legs.  Standing hip abduction/ hip extension 20x each.  Standing wt. Shifting L/R with no UE assist (no R knee pain reported).    Seated R knee flexion/ extension manual isometrics (moderate resistance)- no crepitus in R knee during isometric.  Increase R knee/patella crepitus during LAQ.  Good patellar tracking.      Discussed HEP.    Manual tx.:  Seated R knee AP/PA mobs (grade III-IV) 3x20 seconds.  Seated R knee distraction at edge of table (static holds/ pain tolerable).    Seated R hamstring stretches.  Seated R hip stretches (all planes)- focus on IR/ER.    STM to R distal quad/hamstring (no joint line tenderness)h  PATIENT EDUCATION:  Education details: Reviewed HEP Person educated: Patient Education method: Explanation, Demonstration, and Handouts Education comprehension: verbalized understanding and returned demonstration  HOME EXERCISE PROGRAM: Access Code: [email or web address] URL: [email or web address] Date: 07/21/2023 Prepared by: Dorene Grebe  Exercises - Straight Leg Raise  - 1 x daily - 7 x weekly - 2 sets - 20 reps - Sidelying Hip Abduction  - 1 x daily - 7 x weekly - 2 sets - 20 reps - Supine Quad Set  - 1 x daily - 7 x weekly - 2 sets - 10 reps - Seated March with Resistance  - 1 x daily - 7 x weekly - 2 sets - 10 reps - Seated Hip Abduction with Resistance  - 1 x daily - 7 x weekly - 2 sets - 10 reps  Added seated hamstring stretches on 08/22/23.      ASSESSMENT:  CLINICAL IMPRESSION: Pt. Presents with good R knee ROM but B hip/ R knee strength deficits.  No tenderness along R medial knee with  palpation during manual tx.  Pt. Instructed in the importance of completing HEP on a consistent basis.  Pt. Demonstrates good technique/ recip. Pattern while walking with no assistive device today and no R knee pain.  Pt. Will benefit from skilled PT services to increase R LE strength and improve pain-free mobility with standing/ walking tasks.   OBJECTIVE IMPAIRMENTS: Abnormal gait, decreased activity tolerance, decreased balance, decreased endurance, decreased mobility, difficulty walking, decreased strength, hypomobility, improper body mechanics, and pain.   ACTIVITY LIMITATIONS: standing, squatting, stairs, transfers, locomotion level, and caring for others  PARTICIPATION LIMITATIONS: cleaning and community activity  PERSONAL FACTORS: Fitness and Past/current experiences are also affecting patient's functional outcome.   REHAB POTENTIAL: Good  CLINICAL DECISION MAKING: Evolving/moderate complexity  EVALUATION COMPLEXITY: Moderate   GOALS: Goals reviewed with patient? Yes  LONG TERM GOALS: Target date: 10/05/23  Pt. Independent with HEP to increase R hip/knee strength 1/2 muscle grade to improve pain-free mobility.   Baseline:  see above Goal status: Partially met  2.  Pt. Able to ambulate community distance with use of SPC and no increase c/o R knee pain.   Baseline: 7/10 R knee pain with walking Goal status: Partially met  3.  Pt. Able to stand from chair with no increase c/o R knee pain to improve pain-free mobility.  Baseline: marked increase in R knee pain.   Goal status: Not met  PLAN:  PT FREQUENCY: 2x/week  PT DURATION: 6 weeks  PLANNED INTERVENTIONS: 97110-Therapeutic exercises, 97530- Therapeutic activity, [id number]- Neuromuscular re-education, 97535- Self Care, [id number]- Manual therapy, [phone]- Gait training, [id number]- Electrical stimulation (unattended), Patient/Family education, Balance training, Stair training, Dry Needling, Joint mobilization, Cryotherapy, and Moist  heat  PLAN FOR NEXT SESSION:  progress quad strengthening.    Cammie Mcgee, PT, DPT # [phone] 09/21/2023, 8:44 AM

## 2023-09-28 ENCOUNTER — Encounter: Payer: Self-pay | Admitting: Physical Therapy

## 2023-09-28 ENCOUNTER — Ambulatory Visit: Payer: Medicaid Other | Admitting: Physical Therapy

## 2023-09-28 DIAGNOSIS — R269 Unspecified abnormalities of gait and mobility: Secondary | ICD-10-CM

## 2023-09-28 DIAGNOSIS — M25561 Pain in right knee: Secondary | ICD-10-CM | POA: Diagnosis not present

## 2023-09-28 DIAGNOSIS — G8929 Other chronic pain: Secondary | ICD-10-CM

## 2023-09-28 DIAGNOSIS — M6281 Muscle weakness (generalized): Secondary | ICD-10-CM

## 2023-09-28 DIAGNOSIS — Z89612 Acquired absence of left leg above knee: Secondary | ICD-10-CM

## 2023-09-28 NOTE — Therapy (Signed)
 OUTPATIENT PHYSICAL THERAPY LOWER EXTREMITY TREATMENT  Patient Name: Jocelyn [name] MRN: [id number] DOB:October 17, 1980, 43 y.o., female Today's Date: 09/28/2023  END OF SESSION:  PT End of Session - 09/28/23 0824     Visit Number 6    Number of Visits 8    Date for PT Re-Evaluation 10/05/23    PT Start Time 0822    PT Stop Time 0846    PT Time Calculation (min) 24 min    Equipment Utilized During Treatment Gait belt    Activity Tolerance Patient tolerated treatment well    Behavior During Therapy [facility] for tasks assessed/performed            Past Medical History:  Diagnosis Date   IDA (iron deficiency anemia) 12/17/2019   Past Surgical History:  Procedure Laterality Date   AMPUTATION Left 01/20/2022   Procedure: AMPUTATION ABOVE KNEE;  Surgeon: Annice Needy, MD;  Location: [facility];  Service: Vascular;  Laterality: Left;   APPENDECTOMY     LOWER EXTREMITY ANGIOGRAPHY Left 01/11/2020   Procedure: Lower Extremity Angiography;  Surgeon: Annice Needy, MD;  Location: [facility];  Service: Cardiovascular;  Laterality: Left;   LOWER EXTREMITY ANGIOGRAPHY Left 03/06/2020   Procedure: Lower Extremity Angiography;  Surgeon: Annice Needy, MD;  Location: [facility];  Service: Cardiovascular;  Laterality: Left;   LOWER EXTREMITY ANGIOGRAPHY Left 03/07/2020   Procedure: Lower Extremity Angiography;  Surgeon: Annice Needy, MD;  Location: [facility];  Service: Cardiovascular;  Laterality: Left;   LOWER EXTREMITY ANGIOGRAPHY Left 01/06/2022   Procedure: Lower Extremity Angiography;  Surgeon: Annice Needy, MD;  Location: [facility];  Service: Cardiovascular;  Laterality: Left;   LOWER EXTREMITY ANGIOGRAPHY Left 01/07/2022   Procedure: Lower Extremity Angiography;  Surgeon: Annice Needy, MD;  Location: [facility];  Service: Cardiovascular;  Laterality: Left;   LOWER EXTREMITY ANGIOGRAPHY Left 01/18/2022   Procedure: Lower Extremity Angiography;  Surgeon:  Annice Needy, MD;  Location: [facility];  Service: Cardiovascular;  Laterality: Left;   LOWER EXTREMITY INTERVENTION Bilateral 01/17/2022   Procedure: LOWER EXTREMITY INTERVENTION;  Surgeon: Learta Codding, MD;  Location: [facility];  Service: Cardiovascular;  Laterality: Bilateral;   PERIPHERAL VASCULAR THROMBECTOMY Left 01/20/2022   Procedure: PERIPHERAL VASCULAR THROMBECTOMY Left Femoral vein;  Surgeon: Annice Needy, MD;  Location: [facility];  Service: Vascular;  Laterality: Left;   Patient Active Problem List   Diagnosis Date Noted   S/P AKA (above knee amputation) unilateral, left (HCC) 01/25/2022   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   Acute urinary retention 01/19/2022   Fever 01/18/2022   Acute deep vein thrombosis (DVT) of left femoral vein (HCC) 01/17/2022   Hypomagnesemia 01/06/2022   Hyponatremia 01/06/2022   Critical limb ischemia of left lower extremity (HCC) 01/10/2020   Diabetes mellitus without complication (HCC) 12/17/2019   IDA (iron deficiency anemia) 12/17/2019   HTN (hypertension) 12/17/2019   REFERRING PROVIDER: Kennedy Bucker, MD  REFERRING DIAG: Primary osteoarthritis of right knee  THERAPY DIAG:  Chronic pain of right knee  Muscle weakness (generalized)  Gait difficulty  S/P AKA (above knee amputation) unilateral, left (HCC)  Rationale for Evaluation and Treatment: Rehabilitation  ONSET DATE: Chronic  SUBJECTIVE:   SUBJECTIVE STATEMENT: Pt. Reports acute on chronic R knee pain after falling in November.  Pt. Reports 2/10 R knee pain currently at rest and 7/10 at worst with increase walking/ activity.  Pt. Received  cortisone injection in R knee with benefit.  Pt. Known well to PT clinic after complete PT for L AKA/ prosthetic training.    PERTINENT HISTORY: The patient is a 43 year old female who presents for evaluation of right leg pain.  She underwent a left above-knee amputation and she as advised her right knee pain was due to the  inability to fully bear weight on the left leg. She continues to experience swelling and instability in her knee, leading to her referral here.   The patient also experiences numbness in her right foot which extends to her leg, which she attributes to prolonged standing. She was not going to therapy but he has been discharged. The patient has not received a cortisone injection and is not diabetic.  She developed blood clots in the left lower extremity after the COVID-19 vaccine.  The patient is a smoker.   I have reviewed past medical, surgical, social and family history, and allergies as documented in the EMR.  Past Medical History: Past Medical History:  Diagnosis Date  Hyperlipidemia  Hypertension   Past Surgical History: History reviewed. No pertinent surgical history.  Past Family History: History reviewed. No pertinent family history.  Medications: Current Outpatient Medications  Medication Sig Dispense Refill  acetaminophen (TYLENOL) 325 MG tablet Take 650 mg by mouth every 4 (four) hours as needed for Pain  amLODIPine (NORVASC) 10 MG tablet Take 1 tablet by mouth once daily  apixaban (ELIQUIS) 5 mg tablet Take 5 mg by mouth 2 (two) times daily  atorvastatin (LIPITOR) 20 MG tablet Take 1 tablet by mouth once daily  ferrous sulfate 325 (65 FE) MG tablet Take 325 mg by mouth once daily  gabapentin (NEURONTIN) 600 MG tablet Take 600 mg by mouth 3 (three) times daily  hydroCHLOROthiazide (HYDRODIURIL) 25 MG tablet Take 1 tablet by mouth once daily   Current Facility-Administered Medications  Medication Dose Route Frequency Provider Last Rate Last Admin  betamethasone acetate-betamethasone sodium phosphate (CELESTONE) injection 6 mg 6 mg Intra-articular Once Marlena Clipper, MD  BUPivacaine HCl (MARCAINE) 0.5 % injection 2 mL 2 mL Intra-articular Once Marlena Clipper, MD   Allergies: No Known Allergies   Body mass index is 26.97 kg/m.  Review of Systems: A  comprehensive 14 point ROS was performed, reviewed, and the pertinent orthopaedic findings are documented in the HPI.  Vitals:  05/23/23 0821  BP: 136/78   General Physical Examination:   General/Constitutional: No apparent distress: well-nourished and well developed. Eyes: Pupils equal, round with synchronous movement. Lungs: Clear to auscultation HEENT: Normal Vascular: No edema, swelling or tenderness, except as noted in detailed exam. Cardiac: Heart rate and rhythm is regular. Integumentary: No impressive skin lesions present, except as noted in detailed exam. Neuro/Psych: Normal mood and affect, oriented to person, place, and time.  Musculoskeletal: Patellofemoral crepitation is present in the medial compartment of the right knee. Varus deformity is passively correctable. No Baker cyst is present. Right hip has 60 degrees internal rotation and 30 degrees of external rotation.  Radiographs:  AP, lateral, and oblique x-rays of the right knee were ordered and personally reviewed today. These show medial compartment moderate osteoarthritis with joint space narrowing and spurring medially. On the lateral view, spurring posteriorly on the femoral and tibial condyles. Significant patellofemoral degenerative change, lateral facet arthritis, and spurring are present. Varus deformity is observed.  X-ray Impression  Moderate right knee medial and patellofemoral degenerative changes.   Assessment: ICD-10-CM  1. Primary osteoarthritis of right knee M17.11  2. Acute pain of right knee M25.561  3. Diabetes mellitus without complication (CMS/HHS-HCC) E11.9   Plan:  The patient has clinical findings of moderate right knee medial and patellofemoral degenerative changes.  The patient's symptoms and x-ray findings indicate moderate right knee medial and patellofemoral degenerative changes. A cortisone injection will be administered today. If the cortisone injection does not provide relief,  alternative treatment options will be considered. Physical therapy to build up muscle strength was discussed as a potential treatment to help alleviate pain and improve knee stability.  Knee Injection: The skin was first prepped with chlorhexidine inferolaterally. Ethyl Chloride spray was then applied. A 25 gauge needle was inserted and 1 cc Betamethasone and 3 cc Marcaine was injected. The patient tolerated this well. A Band-Aid was applied.  PAIN:  Are you having pain? Yes: NPRS scale: 2/10 Pain location: R knee (medial aspect) Pain description: aching/ sharp Aggravating factors: standing/ walking Relieving factors: meds/ injection  PRECAUTIONS: Fall  RED FLAGS: None   WEIGHT BEARING RESTRICTIONS: No  FALLS:  Has patient fallen in last 6 months? Yes. 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  Pt. Has applied for disability (pending)- denied 3 times.    PLOF: Requires assistive device for independence  PATIENT GOALS: Increase R knee strength/ improve pain-free mobility.    NEXT MD VISIT: PRN  OBJECTIVE:  Note: Objective measures were completed at Evaluation unless otherwise noted.  DIAGNOSTIC FINDINGS:  Narrative  05/23/2023 2:05 PM EST   AP, lateral, and oblique x-rays of the right knee were ordered and personally reviewed today. These show medial compartment moderate osteoarthritis with joint space narrowing and spurring medially. On the lateral view, spurring posteriorly on the femoral and tibial condyles. Significant patellofemoral degenerative change, lateral facet arthritis, and spurring are present. Varus deformity is observed.  X-ray Impression Moderate right knee medial and patellofemoral degenerative changes.    PATIENT SURVEYS:  FOTO  initial 45/ goal 19  COGNITION: Overall cognitive status: Within functional limits for tasks assessed     SENSATION: WFL  EDEMA:  No swelling noted.  Unable to compare to L knee secondary to AKA.    MUSCLE LENGTH: Hamstrings: Right WFL; Left WFL  POSTURE: rounded shoulders  PALPATION: (+) R medial knee joint line tenderness  LOWER EXTREMITY ROM:  Active ROM Right eval Left eval  Hip flexion    Hip extension    Hip abduction    Hip adduction    Hip internal rotation    Hip external rotation    Knee flexion 126 deg.   Knee extension 0 deg.    Ankle dorsiflexion    Ankle plantarflexion    Ankle inversion    Ankle eversion     (Blank rows = not tested)  LOWER EXTREMITY MMT:  MMT Right eval Left eval  Hip flexion 4/5 4-/5  Hip extension    Hip abduction 4/5 4/5  Hip adduction    Hip internal rotation    Hip external rotation    Knee flexion 4/5 (pain)   Knee extension 4/5   Ankle dorsiflexion 5/5   Ankle plantarflexion    Ankle inversion    Ankle eversion     (Blank rows = not tested)  LOWER EXTREMITY SPECIAL TESTS:  Knee special tests: Anterior  drawer test: negative and McMurray's test: negative  FUNCTIONAL TESTS:  5 times sit to stand: TBD  GAIT: Distance walked: in clinic Assistive device utilized: Single point cane Level of assistance: Modified independence Comments: L prosthetic leg with good knee mechanism control.  R knee varus during stance phase of gait with slight R antalgic gait.  Increase c/o R knee discomfort with increase distance walked.                                                                                                                                TREATMENT DATE: 09/28/2023  Subjective:  Pt. Reports no new complaints and no falls since last tx. Session.  Pt. States R knee has continued to feel better since injection.  Pt. Limited with tx. Time today secondary to mother has MD appt.     There.ex.:  Nustep L5 B UE/LE for  10 minutes.  Discussed R knee symptoms and walking at home.  Walking in //-bars without use of UE and focus on consistent step length/ heel strike.  Pt. Has difficulty with head position and looks down to watch legs.  Standing wt. Shifting L/R with no UE assist (no R knee pain reported).    Sit to stands from gray chair with good R quad control/ no knee pain.    Reviewed HEP.    PATIENT EDUCATION:  Education details: Reviewed HEP Person educated: Patient Education method: Explanation, Demonstration, and Handouts Education comprehension: verbalized understanding and returned demonstration  HOME EXERCISE PROGRAM: Access Code: [email or web address] URL: [email or web address] Date: 07/21/2023 Prepared by: Dorene Grebe  Exercises - Straight Leg Raise  - 1 x daily - 7 x weekly - 2 sets - 20 reps - Sidelying Hip Abduction  - 1 x daily - 7 x weekly - 2 sets - 20 reps - Supine Quad Set  - 1 x daily - 7 x weekly - 2 sets - 10 reps - Seated March with Resistance  - 1 x daily - 7 x weekly - 2 sets - 10 reps - Seated Hip Abduction with Resistance  - 1 x daily - 7 x weekly - 2 sets - 10 reps  Added seated hamstring stretches on 08/22/23.      ASSESSMENT:  CLINICAL IMPRESSION: Pt. Presents with good R knee ROM but B hip/ R knee strength deficits.  No R knee pain or tenderness noted with palpation during Nustep.  Pt. Instructed in the importance of completing HEP on a consistent basis.  Pt. Demonstrates good technique/ recip. Pattern while walking with no assistive device today and no R knee pain.  Pt. Will benefit from skilled PT services to increase R LE strength and improve pain-free mobility with standing/ walking tasks.   OBJECTIVE IMPAIRMENTS: Abnormal gait, decreased activity tolerance, decreased balance, decreased endurance, decreased mobility, difficulty walking, decreased strength, hypomobility, improper body mechanics, and pain.   ACTIVITY LIMITATIONS: standing, squatting, stairs,  transfers, locomotion level, and caring for  others  PARTICIPATION LIMITATIONS: cleaning and community activity  PERSONAL FACTORS: Fitness and Past/current experiences are also affecting patient's functional outcome.   REHAB POTENTIAL: Good  CLINICAL DECISION MAKING: Evolving/moderate complexity  EVALUATION COMPLEXITY: Moderate   GOALS: Goals reviewed with patient? Yes  LONG TERM GOALS: Target date: 10/05/23  Pt. Independent with HEP to increase R hip/knee strength 1/2 muscle grade to improve pain-free mobility.   Baseline:  see above Goal status: Partially met  2.  Pt. Able to ambulate community distance with use of SPC and no increase c/o R knee pain.   Baseline: 7/10 R knee pain with walking Goal status: Partially met  3.  Pt. Able to stand from chair with no increase c/o R knee pain to improve pain-free mobility.  Baseline: marked increase in R knee pain.  3/19: no R knee pain with STS today.  Goal status: Goal met  PLAN:  PT FREQUENCY: 2x/week  PT DURATION: 6 weeks  PLANNED INTERVENTIONS: 97110-Therapeutic exercises, 97530- Therapeutic activity, [id number]- Neuromuscular re-education, 97535- Self Care, 08657- Manual therapy, [phone]- Gait training, 97014- Electrical stimulation (unattended), Patient/Family education, Balance training, Stair training, Dry Needling, Joint mobilization, Cryotherapy, and Moist heat  PLAN FOR NEXT SESSION:  progress quad strengthening.  CHECK GOALS/ probable discharge next tx.     Cammie Mcgee, PT, DPT # [phone] 09/28/2023, 3:10 PM

## 2023-10-03 ENCOUNTER — Ambulatory Visit: Payer: Medicaid Other | Admitting: Physical Therapy

## 2023-10-14 ENCOUNTER — Other Ambulatory Visit (INDEPENDENT_AMBULATORY_CARE_PROVIDER_SITE_OTHER): Payer: Self-pay | Admitting: Nurse Practitioner

## 2023-11-29 ENCOUNTER — Encounter (INDEPENDENT_AMBULATORY_CARE_PROVIDER_SITE_OTHER): Payer: Self-pay

## 2024-01-20 ENCOUNTER — Other Ambulatory Visit (INDEPENDENT_AMBULATORY_CARE_PROVIDER_SITE_OTHER): Payer: Self-pay | Admitting: Nurse Practitioner

## 2024-01-20 DIAGNOSIS — Z9889 Other specified postprocedural states: Secondary | ICD-10-CM

## 2024-01-24 ENCOUNTER — Encounter (INDEPENDENT_AMBULATORY_CARE_PROVIDER_SITE_OTHER): Payer: Medicaid Other

## 2024-01-24 ENCOUNTER — Ambulatory Visit (INDEPENDENT_AMBULATORY_CARE_PROVIDER_SITE_OTHER): Payer: Medicaid Other | Admitting: Vascular Surgery

## 2024-02-28 ENCOUNTER — Ambulatory Visit (INDEPENDENT_AMBULATORY_CARE_PROVIDER_SITE_OTHER): Admitting: Vascular Surgery

## 2024-02-28 ENCOUNTER — Other Ambulatory Visit (INDEPENDENT_AMBULATORY_CARE_PROVIDER_SITE_OTHER)

## 2024-02-28 ENCOUNTER — Encounter (INDEPENDENT_AMBULATORY_CARE_PROVIDER_SITE_OTHER): Payer: Self-pay | Admitting: Vascular Surgery

## 2024-02-28 VITALS — BP 122/82 | HR 94 | Ht 65.0 in

## 2024-02-28 DIAGNOSIS — I739 Peripheral vascular disease, unspecified: Secondary | ICD-10-CM | POA: Diagnosis not present

## 2024-02-28 DIAGNOSIS — I10 Essential (primary) hypertension: Secondary | ICD-10-CM

## 2024-02-28 DIAGNOSIS — E119 Type 2 diabetes mellitus without complications: Secondary | ICD-10-CM | POA: Diagnosis not present

## 2024-02-28 DIAGNOSIS — Z89612 Acquired absence of left leg above knee: Secondary | ICD-10-CM | POA: Diagnosis not present

## 2024-02-28 DIAGNOSIS — Z9889 Other specified postprocedural states: Secondary | ICD-10-CM | POA: Diagnosis not present

## 2024-02-28 MED ORDER — APIXABAN 5 MG PO TABS: 5.0000 mg | ORAL_TABLET | Freq: Two times a day (BID) | ORAL | 5 refills | Status: AC

## 2024-02-28 NOTE — Progress Notes (Signed)
 MRN : [id number]  Jocelyn [name] is a 43 y.o. ([birth year]) female who presents with chief complaint of  Chief Complaint  Patient presents with   Follow-up    Encounter form : F/u 6 months  ABI  JD or FB Pt is needing refills on meds      .  History of Present Illness: Patient returns today in follow up of Her PAD.  Patient is doing well.  Her biggest issue right now is her bone-on-bone in the right knee.  She needs a knee replacement she is trying to hold off as long as possible on this.  She is several years status post left above-knee amputation and walking with her prosthesis.  Her right ABI today is 1.0 with biphasic waveforms.  Current Outpatient Medications  Medication Sig Dispense Refill   acetaminophen  (TYLENOL ) 325 MG tablet Take 1-2 tablets (325-650 mg total) by mouth every 4 (four) hours as needed for mild pain.     amLODipine  (NORVASC ) 10 MG tablet Take 1 tablet (10 mg total) by mouth daily. 30 tablet 0   apixaban  (ELIQUIS ) 5 MG TABS tablet Take 1 tablet (5 mg total) by mouth 2 (two) times daily. 60 tablet 1   atorvastatin (LIPITOR) 20 MG tablet Take 20 mg by mouth daily.     hydrochlorothiazide  (HYDRODIURIL ) 25 MG tablet Take 1 tablet (25 mg total) by mouth daily. 30 tablet 0   pregabalin  (LYRICA ) 150 MG capsule TAKE 1 CAPSULE BY MOUTH 2 TIMES A DAY. 60 capsule 3   Ferrous Sulfate  (IRON ) 325 (65 Fe) MG TABS Take 1 tablet (325 mg total) by mouth daily. 60 tablet 0   methocarbamol  (ROBAXIN ) 500 MG tablet Take 1 tablet (500 mg total) by mouth every 6 (six) hours as needed for muscle spasms. (Patient not taking: Reported on 02/28/2024) 60 tablet 0   mupirocin  ointment (BACTROBAN ) 2 % Apply 1 Application topically daily. (Patient not taking: Reported on 02/28/2024) 30 g 2   nicotine  (NICODERM CQ  - DOSED IN MG/24 HOURS) 21 mg/24hr patch Place 1 patch (21 mg total) onto the skin daily. (Patient not taking: Reported on 02/28/2024) 28 patch 0   oxyCODONE  (OXY IR/ROXICODONE ) 5 MG  immediate release tablet Take 1 tablet (5 mg total) by mouth every 6 (six) hours as needed for severe pain. (Patient not taking: Reported on 02/28/2024) 28 tablet 0   potassium chloride  SA (KLOR-CON  M) 20 MEQ tablet Take 1 tablet (20 mEq total) by mouth daily. (Patient not taking: Reported on 02/28/2024) 30 tablet 0   traZODone  (DESYREL ) 50 MG tablet Take 0.5-1 tablets (25-50 mg total) by mouth at bedtime as needed for sleep. (Patient not taking: Reported on 02/28/2024) 10 tablet 0   No current facility-administered medications for this visit.    Past Medical History:  Diagnosis Date   IDA (iron  deficiency anemia) 12/17/2019    Past Surgical History:  Procedure Laterality Date   AMPUTATION Left 01/20/2022   Procedure: AMPUTATION ABOVE KNEE;  Surgeon: Marea Selinda RAMAN, MD;  Location: [facility];  Service: Vascular;  Laterality: Left;   APPENDECTOMY     LOWER EXTREMITY ANGIOGRAPHY Left 01/11/2020   Procedure: Lower Extremity Angiography;  Surgeon: Marea Selinda RAMAN, MD;  Location: [facility];  Service: Cardiovascular;  Laterality: Left;   LOWER EXTREMITY ANGIOGRAPHY Left 03/06/2020   Procedure: Lower Extremity Angiography;  Surgeon: Marea Selinda RAMAN, MD;  Location: [facility];  Service: Cardiovascular;  Laterality: Left;   LOWER EXTREMITY ANGIOGRAPHY  Left 03/07/2020   Procedure: Lower Extremity Angiography;  Surgeon: Marea Selinda RAMAN, MD;  Location: [facility];  Service: Cardiovascular;  Laterality: Left;   LOWER EXTREMITY ANGIOGRAPHY Left 01/06/2022   Procedure: Lower Extremity Angiography;  Surgeon: Marea Selinda RAMAN, MD;  Location: [facility];  Service: Cardiovascular;  Laterality: Left;   LOWER EXTREMITY ANGIOGRAPHY Left 01/07/2022   Procedure: Lower Extremity Angiography;  Surgeon: Marea Selinda RAMAN, MD;  Location: [facility];  Service: Cardiovascular;  Laterality: Left;   LOWER EXTREMITY ANGIOGRAPHY Left 01/18/2022   Procedure: Lower Extremity Angiography;  Surgeon: Marea Selinda RAMAN, MD;  Location: [facility];  Service: Cardiovascular;  Laterality: Left;   LOWER EXTREMITY INTERVENTION Bilateral 01/17/2022   Procedure: LOWER EXTREMITY INTERVENTION;  Surgeon: Clarice Martin, MD;  Location: [facility];  Service: Cardiovascular;  Laterality: Bilateral;   PERIPHERAL VASCULAR THROMBECTOMY Left 01/20/2022   Procedure: PERIPHERAL VASCULAR THROMBECTOMY Left Femoral vein;  Surgeon: Marea Selinda RAMAN, MD;  Location: [facility];  Service: Vascular;  Laterality: Left;     Social History   Tobacco Use   Smoking status: Every Day    Current packs/day: 0.50    Types: Cigarettes   Smokeless tobacco: Never  Vaping Use   Vaping status: Never Used  Substance Use Topics   Alcohol use: Not Currently   Drug use: Never      Family History  Problem Relation Age of Onset   Hypertension Mother    Hypertension Sister    Hypertension Brother      No Known Allergies   REVIEW OF SYSTEMS (Negative unless checked)  Constitutional: [] Weight loss  [] Fever  [] Chills Cardiac: [] Chest pain   [] Chest pressure   [] Palpitations   [] Shortness of breath when laying flat   [] Shortness of breath at rest   [] Shortness of breath with exertion. Vascular:  [] Pain in legs with walking   [] Pain in legs at rest   [] Pain in legs when laying flat   [] Claudication   [] Pain in feet when walking  [] Pain in feet at rest  [] Pain in feet when laying flat   [] History of DVT   [] Phlebitis   [] Swelling in legs   [] Varicose veins   [] Non-healing ulcers Pulmonary:   [] Uses home oxygen   [] Productive cough   [] Hemoptysis   [] Wheeze  [] COPD   [] Asthma Neurologic:  [] Dizziness  [] Blackouts   [] Seizures   [] History of stroke   [] History of TIA  [] Aphasia   [] Temporary blindness   [] Dysphagia   [] Weakness or numbness in arms   [] Weakness or numbness in legs Musculoskeletal:  [x] Arthritis   [] Joint swelling   [x] Joint pain   [] Low back pain Hematologic:  [] Easy bruising  [] Easy bleeding    [] Hypercoagulable state   [] Anemic   Gastrointestinal:  [] Blood in stool   [] Vomiting blood  [] Gastroesophageal reflux/heartburn   [] Abdominal pain Genitourinary:  [] Chronic kidney disease   [] Difficult urination  [] Frequent urination  [] Burning with urination   [] Hematuria Skin:  [] Rashes   [] Ulcers   [] Wounds Psychological:  [] History of anxiety   []  History of major depression.  Physical Examination  BP 122/82   Pulse 94   Ht 5' 5 (1.651 m)   BMI 31.45 kg/m  Gen:  WD/WN, NAD Head: Eva/AT, No temporalis wasting. Ear/Nose/Throat: Hearing grossly intact, nares w/o erythema or drainage Eyes: Conjunctiva clear. Sclera non-icteric Neck: Supple.  Trachea midline Pulmonary:  Good air movement, no use of accessory muscles.  Cardiac: RRR, no JVD Vascular:  Vessel Right Left  Radial Palpable Palpable                          PT Palpable Not Palpable  DP Palpable Not Palpable   Gastrointestinal: soft, non-tender/non-distended. No guarding/reflex.  Musculoskeletal: M/S 5/5 throughout.  No deformity or atrophy. Left AKA well healed. No right leg edema. Neurologic: Sensation grossly intact in extremities.  Symmetrical.  Speech is fluent.  Psychiatric: Judgment intact, Mood & affect appropriate for pt's clinical situation. Dermatologic: No rashes or ulcers noted.  No cellulitis or open wounds.      Labs No results found for this or any previous visit (from the past 2160 hours).  Radiology No results found.  Assessment/Plan  PAD (peripheral artery disease) (HCC) Her right ABI today is 1.0 with biphasic waveforms. Doing better with more activity. Ok  to have any surgery she needs on that leg. Recheck in 6 months.   HTN (hypertension) blood pressure control important in reducing the progression of atherosclerotic disease. On appropriate oral medications.   Diabetes mellitus without complication (HCC) blood glucose control important in reducing the progression of  atherosclerotic disease. Also, involved in wound healing. On appropriate medications.   S/P AKA (above knee amputation) unilateral, left (HCC) Well healed.     Selinda Gu, MD  02/28/2024 10:30 AM    This note was created with Dragon medical transcription system.  Any errors from dictation are purely unintentional

## 2024-02-28 NOTE — Addendum Note (Signed)
 Addended by: MAREA SELINDA RAMAN on: 02/28/2024 12:03 PM   Modules accepted: Orders

## 2024-02-28 NOTE — Assessment & Plan Note (Signed)
 blood glucose control important in reducing the progression of atherosclerotic disease. Also, involved in wound healing. On appropriate medications.

## 2024-02-28 NOTE — Assessment & Plan Note (Signed)
 Well-healed

## 2024-02-28 NOTE — Assessment & Plan Note (Signed)
 blood pressure control important in reducing the progression of atherosclerotic disease. On appropriate oral medications.

## 2024-02-28 NOTE — Assessment & Plan Note (Signed)
 Her right ABI today is 1.0 with biphasic waveforms. Doing better with more activity. Ok  to have any surgery she needs on that leg. Recheck in 6 months.

## 2024-02-29 LAB — VAS US ABI WITH/WO TBI: Right ABI: 1.04

## 2024-07-02 ENCOUNTER — Encounter: Payer: Self-pay | Admitting: Oncology

## 2024-07-09 ENCOUNTER — Inpatient Hospital Stay: Admitting: Oncology

## 2024-07-09 ENCOUNTER — Inpatient Hospital Stay

## 2024-07-24 ENCOUNTER — Inpatient Hospital Stay: Attending: Oncology | Admitting: Oncology

## 2024-07-24 ENCOUNTER — Inpatient Hospital Stay

## 2024-07-24 ENCOUNTER — Encounter: Payer: Self-pay | Admitting: Oncology

## 2024-07-24 VITALS — BP 134/87 | HR 103 | Temp 97.6°F | Resp 20 | Wt 192.6 lb

## 2024-07-24 DIAGNOSIS — D509 Iron deficiency anemia, unspecified: Secondary | ICD-10-CM

## 2024-07-24 DIAGNOSIS — F17210 Nicotine dependence, cigarettes, uncomplicated: Secondary | ICD-10-CM | POA: Insufficient documentation

## 2024-07-24 DIAGNOSIS — Z7901 Long term (current) use of anticoagulants: Secondary | ICD-10-CM | POA: Diagnosis not present

## 2024-07-24 DIAGNOSIS — E119 Type 2 diabetes mellitus without complications: Secondary | ICD-10-CM

## 2024-07-24 DIAGNOSIS — Z86718 Personal history of other venous thrombosis and embolism: Secondary | ICD-10-CM | POA: Diagnosis not present

## 2024-07-24 DIAGNOSIS — N920 Excessive and frequent menstruation with regular cycle: Secondary | ICD-10-CM | POA: Diagnosis not present

## 2024-07-24 DIAGNOSIS — Z635 Disruption of family by separation and divorce: Secondary | ICD-10-CM | POA: Insufficient documentation

## 2024-07-24 DIAGNOSIS — Z79899 Other long term (current) drug therapy: Secondary | ICD-10-CM | POA: Insufficient documentation

## 2024-07-24 DIAGNOSIS — R5383 Other fatigue: Secondary | ICD-10-CM | POA: Insufficient documentation

## 2024-07-24 DIAGNOSIS — Z9049 Acquired absence of other specified parts of digestive tract: Secondary | ICD-10-CM | POA: Insufficient documentation

## 2024-07-24 DIAGNOSIS — Z59868 Other specified financial insecurity: Secondary | ICD-10-CM | POA: Insufficient documentation

## 2024-07-24 DIAGNOSIS — E785 Hyperlipidemia, unspecified: Secondary | ICD-10-CM | POA: Diagnosis not present

## 2024-07-24 DIAGNOSIS — Z89612 Acquired absence of left leg above knee: Secondary | ICD-10-CM | POA: Insufficient documentation

## 2024-07-24 DIAGNOSIS — Z8249 Family history of ischemic heart disease and other diseases of the circulatory system: Secondary | ICD-10-CM | POA: Insufficient documentation

## 2024-07-24 DIAGNOSIS — Z993 Dependence on wheelchair: Secondary | ICD-10-CM | POA: Insufficient documentation

## 2024-07-24 LAB — CBC WITH DIFFERENTIAL/PLATELET
Abs Immature Granulocytes: 0.04 K/uL (ref 0.00–0.07)
Basophils Absolute: 0 K/uL (ref 0.0–0.1)
Basophils Relative: 0 %
Eosinophils Absolute: 0.1 K/uL (ref 0.0–0.5)
Eosinophils Relative: 1 %
HCT: 28.4 % — ABNORMAL LOW (ref 36.0–46.0)
Hemoglobin: 7.6 g/dL — ABNORMAL LOW (ref 12.0–15.0)
Immature Granulocytes: 1 %
Lymphocytes Relative: 22 %
Lymphs Abs: 1.5 K/uL (ref 0.7–4.0)
MCH: 18.5 pg — ABNORMAL LOW (ref 26.0–34.0)
MCHC: 26.8 g/dL — ABNORMAL LOW (ref 30.0–36.0)
MCV: 69.3 fL — ABNORMAL LOW (ref 80.0–100.0)
Monocytes Absolute: 0.6 K/uL (ref 0.1–1.0)
Monocytes Relative: 9 %
Neutro Abs: 4.6 K/uL (ref 1.7–7.7)
Neutrophils Relative %: 67 %
Platelets: 535 K/uL — ABNORMAL HIGH (ref 150–400)
RBC: 4.1 MIL/uL (ref 3.87–5.11)
RDW: 21.9 % — ABNORMAL HIGH (ref 11.5–15.5)
WBC: 6.9 K/uL (ref 4.0–10.5)
nRBC: 0.3 % — ABNORMAL HIGH (ref 0.0–0.2)

## 2024-07-24 LAB — VITAMIN B12: Vitamin B-12: 578 pg/mL (ref 180–914)

## 2024-07-24 LAB — IRON AND TIBC
Iron: 12 ug/dL — ABNORMAL LOW (ref 28–170)
Saturation Ratios: 2 % — ABNORMAL LOW (ref 10.4–31.8)
TIBC: 595 ug/dL — ABNORMAL HIGH (ref 250–450)
UIBC: 583 ug/dL

## 2024-07-24 LAB — FERRITIN: Ferritin: 28 ng/mL (ref 11–307)

## 2024-07-24 LAB — ANTITHROMBIN III: AntiThromb III Func: 123 % — ABNORMAL HIGH (ref 75–120)

## 2024-07-24 NOTE — Progress Notes (Signed)
 "  Hematology/Oncology Consult note [facility] [phone] [phone]  Patient Care Team: Center, [facility] as PCP - General   Name of the patient: Jocelyn [name]  [id number]  May 23, 1981    Reason for referral-iron  deficiency anemia   Referring physician-[facility] patient  Date of visit: 07/24/2024   History of presenting illness-patient is a 44 year old female with a past medical history significant for hypertension, PAD s/p left AKA, diabetes, history of DVT on Eliquis , hyperlipidemia among other medical problems.  She has been referred for iron  deficiency anemia.  CBC from December 2025 showed white count of 9.9, H&H of 7.6/29.1 with an MCV of 68 and a platelet count of 454.  BMP was within normal limits.  LFTs were normal.  I do not see any iron  studies that were recently ordered.  Patient has never had an EGD or colonoscopy looking back at her prior imaging studies patient had ultrasound of her left lower extremity in July 2023 which showed occlusive thrombus in the left lower extremity from popliteal vein to the femoral vein.  CT angiogram showed thrombus extending into the lumen of the common iliac artery along with occlusion of superficial femoral artery patient has remained on Eliquis  since then   She has severe iron  deficiency anemia, with a hemoglobin of 7.5 g/dL measured last month. She experiences menorrhagia, described as very heavy menstrual cycles, and is not currently receiving any gynecologic treatment. She prefers to continue iron  infusions rather than pursue hormonal or surgical interventions. She denies hematuria or hematochezia. Stool testing for occult blood remains outstanding.  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 She has been on apixaban  since that time without further thrombotic events. She expresses  significant concern about holding anticoagulation for procedures, associating discontinuation with her prior amputation, and is apprehensive about stopping apixaban  even temporarily.  She has a family history of colon cancer in her mother and a personal history of colon cancer in 2011, for which she undergoes colonoscopy every two years. She has not had any recent symptoms of gastrointestinal bleeding.       ECOG PS- 1  Pain scale- 0   Review of systems- Review of Systems  Constitutional:  Positive for malaise/fatigue. Negative for chills, fever and weight loss.  HENT:  Negative for congestion, ear discharge and nosebleeds.   Eyes:  Negative for blurred vision.  Respiratory:  Negative for cough, hemoptysis, sputum production, shortness of breath and wheezing.   Cardiovascular:  Negative for chest pain, palpitations, orthopnea and claudication.  Gastrointestinal:  Negative for abdominal pain, blood in stool, constipation, diarrhea, heartburn, melena, nausea and vomiting.  Genitourinary:  Negative for dysuria, flank pain, frequency, hematuria and urgency.  Musculoskeletal:  Negative for back pain, joint pain and myalgias.  Skin:  Negative for rash.  Neurological:  Negative for dizziness, tingling, focal weakness, seizures, weakness and headaches.  Endo/Heme/Allergies:  Does not bruise/bleed easily.  Psychiatric/Behavioral:  Negative for depression and suicidal ideas. The patient does not have insomnia.     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   Acute urinary retention 01/19/2022   Fever 01/18/2022   Acute deep vein thrombosis (DVT) of left femoral vein (HCC) 01/17/2022   Hypomagnesemia 01/06/2022   Hyponatremia 01/06/2022   Critical limb ischemia of left lower extremity (HCC) 01/10/2020   Disturbance of skin sensation 12/21/2019   Diabetes mellitus without complication (HCC) 12/17/2019   IDA (iron  deficiency anemia) 12/17/2019   PAD (peripheral artery disease) 12/17/2019   HTN (hypertension) 12/17/2019   Routine history and physical examination of adult 08/31/2019   Body mass index 34.0-34.9, adult 08/03/2019   Tobacco use disorder 01/24/2014     Past Medical History:  Diagnosis Date   IDA (iron  deficiency anemia) 12/17/2019     Past Surgical History:  Procedure Laterality Date   AMPUTATION Left 01/20/2022   Procedure: AMPUTATION ABOVE KNEE;  Surgeon: Marea Selinda RAMAN, MD;  Location: [facility] ORS;  Service: Vascular;  Laterality: Left;   APPENDECTOMY     LOWER EXTREMITY ANGIOGRAPHY Left 01/11/2020   Procedure: Lower Extremity Angiography;  Surgeon: Marea Selinda RAMAN, MD;  Location: [facility] INVAS[facility] CV [facility];  Service: Cardiovascular;  Laterality: Left;   LOWER EXTREMITY ANGIOGRAPHY Left 03/06/2020   Procedure: Lower Extremity Angiography;  Surgeon: Marea Selinda RAMAN, MD;  Location: [facility] IN[facility];  Service: Cardiovascular;  Laterality: Left;   LOWER EXTREMITY ANGIOGRAPHY Left 03/07/2020   Procedure: Lower Extremity Angiography;  Surgeon: Marea Selinda RAMAN, MD;  Location: [facility];  Service: Cardiovascular;  Laterality: Left;   LOWER EXTREMITY ANGIOGRAPHY Left 01/06/2022   Procedure: Lower Extremity Angiography;  Surgeon: Marea Selinda RAMAN, MD;  Location: [facility];  Service: Cardiovascular;  Laterality: Left;   LOWER EXTREMITY ANGIOGRAPHY Left 01/07/2022   Procedure: Lower Extremity Angiography;  Surgeon: Marea Selinda RAMAN, MD;  Location: [facility]  INVASIVE CV LAB;  Service: Cardiovascular;  Laterality: Left;   LOWER EXTREMITY ANGIOGRAPHY Left 01/18/2022   Procedure: Lower Extremity Angiography;  Surgeon: Marea Selinda RAMAN, MD;  Location: [facility] IN[facility];  Service: Cardiovascular;  Laterality: Left;   LOWER EXTREMITY INTERVENTION Bilateral 01/17/2022   Procedure: LOWER EXTREMITY INTERVENTION;  Surgeon: Clarice Martin, MD;  Location: [facility] IN[facility] CV [facility];  Service: Cardiovascular;  Laterality: Bilateral;   PERIPHERAL VASCULAR THROMBECTOMY Left 01/20/2022   Procedure: PERIPHERAL VASCULAR THROMBECTOMY Left Femoral vein;  Surgeon: Marea Selinda RAMAN, MD;  Location: [facility] OR[facility];  Service: Vascular;  Laterality: Left;    Social History   Socioeconomic History   Marital status: Legally Separated    Spouse name: Not on file   Number of children: Not on file   Years of education: Not on file   Highest education level: Not on file  Occupational History   Not on file  Tobacco Use   Smoking status: Every Day    Current packs/day: 0.50    Types: Cigarettes   Smokeless tobacco: Never  Vaping Use   Vaping status: Never Used  Substance and Sexual Activity   Alcohol use: Yes   Drug use: Never   Sexual activity: Not Currently    Birth control/protection: None  Other Topics Concern   Not on file  Social History Narrative   Not on file   Social Drivers of Health   Tobacco Use: High Risk (07/24/2024)   Patient History    Smoking Tobacco Use: Every Day    Smokeless  Tobacco Use: Never    Passive Exposure: Not on file  Financial Resource Strain: High Risk (09/07/2023)   Received from [facility]   Overall Financial Resource Strain (CARDIA)    Difficulty of Paying Living Expenses: Very hard  Food Insecurity: No Food Insecurity (07/24/2024)   Epic    Worried About Running Out of Food in the Last Year: Never true    Ran Out of Food in the Last Year: Never true  Transportation Needs: No Transportation Needs (07/24/2024)   Epic    Lack  of Transportation (Medical): No    Lack of Transportation (Non-Medical): No  Physical Activity: Not on file  Stress: Not on file  Social Connections: Not on file  Intimate Partner Violence: Not At Risk (07/24/2024)   Epic    Fear of Current or Ex-Partner: No    Emotionally Abused: No    Physically Abused: No    Sexually Abused: No  Depression (PHQ2-9): Low Risk (07/24/2024)   Depression (PHQ2-9)    PHQ-2 Score: 0  Alcohol Screen: Not on file  Housing: Low Risk (07/24/2024)   Epic    Unable to Pay for Housing in the Last Year: No    Number of Times Moved in the Last Year: 0    Homeless in the Last Year: No  Utilities: Not At Risk (07/24/2024)   Epic    Threatened with loss of utilities: No  Health Literacy: Not on file     Family History  Problem Relation Age of Onset   Hypertension Mother    Hypertension Sister    Hypertension Brother     Current Medications[2]   Physical exam:  Vitals:   07/24/24 1436  BP: 134/87  Pulse: (!) 103  Resp: 20  Temp: 97.6 F (36.4 C)  SpO2: 100%  Weight: 192 lb 9.6 oz (87.4 kg)   Physical Exam Cardiovascular:     Rate and Rhythm: Normal rate and regular rhythm.     Heart sounds: Normal heart sounds.  Pulmonary:     Effort: Pulmonary effort is normal.     Breath sounds: Normal breath sounds.  Abdominal:     General: Bowel sounds are normal.     Palpations: Abdomen is soft.  Musculoskeletal:     Comments: S/p left AKA. Prosthesis in place  Skin:    General: Skin is warm and dry.  Neurological:     Mental Status: She is alert and oriented to person, place, and time.           Latest Ref Rng & Units 02/01/2022    8:07 AM  CMP  Glucose 70 - 99 mg/dL 871   BUN 6 - 20 mg/dL 13   Creatinine 9.55 - 1.00 mg/dL 9.33   Sodium 864 - 854 mmol/L 139   Potassium 3.5 - 5.1 mmol/L 3.7   Chloride 98 - 111 mmol/L 105   CO2 22 - 32 mmol/L 24   Calcium 8.9 - 10.3 mg/dL 89.6       Latest Ref Rng & Units 02/01/2022    8:07 AM  CBC   WBC 4.0 - 10.5 K/uL 6.9   Hemoglobin 12.0 - 15.0 g/dL 89.0   Hematocrit 63.9 - 46.0 % 36.5   Platelets 150 - 400 K/uL 514    Assessment and plan- Patient is a 44 y.o. female referred for iron  deficiency anemia  Assessment and Plan    Iron  deficiency anemia Severe, chronic iron  deficiency anemia likely due to menorrhagia.  Hemoglobin remains low. IV iron  is a temporary measure unless underlying cause is addressed. Evaluation for other blood loss sources, including gastrointestinal, is necessary due to colon cancer history. She prefers iron  infusions over hormonal or surgical interventions. - Ordered hemoglobin, iron  studies, and B12 levels. - Planned five doses of IV iron  (Venofer ) over two weeks per insurance requirements. - Reviewed rare risk of allergic reaction to IV iron  and discussed need for alternative therapy if reaction occurs. - Provided education regarding the limitations of IV iron  as a temporary solution and the necessity of addressing the underlying cause of blood loss. - Discussed referral to gynecology for definitive management of menorrhagia. - Discussed referral to gastroenterology for evaluation of gastrointestinal blood loss and colon cancer screening. - Scheduled follow-up in three weeks.  History of deep vein thrombosis Left lower extremity DVT in 2023, managed with Eliquis . No recurrent thrombotic events. Etiology unclear; possible factors include diabetes and recent COVID vaccination. She is apprehensive about holding Eliquis  for procedures due to prior amputation. Discussed holding Eliquis  for colonoscopy with reassurance on safety of short-term interruption. - Ordered laboratory evaluation for hypercoagulable states and other thrombosis risk factors. - Discussed potential need to hold Eliquis  for 2-3 days prior to colonoscopy with polypectomy, with further coordination with vascular surgery and gastroenterology as needed.     Thank you for this kind referral and  the opportunity to participate in the care of this patient   Visit Diagnosis 1. Iron  deficiency anemia, unspecified iron  deficiency anemia type   2. History of DVT (deep vein thrombosis)     Dr. Annah Skene, MD, MPH [facility] [phone] 07/24/2024                    [1] No Known Allergies [2]  Current Outpatient Medications:    acetaminophen  (TYLENOL ) 325 MG tablet, Take 1-2 tablets (325-650 mg total) by mouth every 4 (four) hours as needed for mild pain., Disp: , Rfl:    amLODipine  (NORVASC ) 10 MG tablet, Take 1 tablet (10 mg total) by mouth daily., Disp: 30 tablet, Rfl: 0   apixaban  (ELIQUIS ) 5 MG TABS tablet, Take 1 tablet (5 mg total) by mouth 2 (two) times daily., Disp: 60 tablet, Rfl: 1   apixaban  (ELIQUIS ) 5 MG TABS tablet, Take 1 tablet (5 mg total) by mouth 2 (two) times daily., Disp: 60 tablet, Rfl: 5   atorvastatin (LIPITOR) 20 MG tablet, Take 20 mg by mouth daily., Disp: , Rfl:    Ferrous Sulfate  (IRON ) 325 (65 Fe) MG TABS, Take 1 tablet (325 mg total) by mouth daily., Disp: 60 tablet, Rfl: 0   hydrochlorothiazide  (HYDRODIURIL ) 25 MG tablet, Take 1 tablet (25 mg total) by mouth daily., Disp: 30 tablet, Rfl: 0   pregabalin  (LYRICA ) 150 MG capsule, TAKE 1 CAPSULE BY MOUTH 2 TIMES A DAY., Disp: 60 capsule, Rfl: 3   methocarbamol  (ROBAXIN ) 500 MG tablet, Take 1 tablet (500 mg total) by mouth every 6 (six) hours as needed for muscle spasms. (Patient not taking: Reported on 07/24/2024), Disp: 60 tablet, Rfl: 0   mupirocin  ointment (BACTROBAN ) 2 %, Apply 1 Application topically daily. (Patient not taking: Reported on 07/24/2024), Disp: 30 g, Rfl: 2   nicotine  (NICODERM CQ  - DOSED IN MG/24 HOURS) 21 mg/24hr patch, Place 1 patch (21 mg total) onto the skin daily. (Patient not taking: Reported on 07/24/2024), Disp: 28 patch, Rfl: 0   oxyCODONE  (OXY IR/ROXICODONE ) 5 MG immediate release tablet, Take 1  tablet (5 mg total) by mouth every 6 (six)  hours as needed for severe pain. (Patient not taking: Reported on 07/24/2024), Disp: 28 tablet, Rfl: 0   potassium chloride  SA (KLOR-CON  M) 20 MEQ tablet, Take 1 tablet (20 mEq total) by mouth daily. (Patient not taking: Reported on 07/24/2024), Disp: 30 tablet, Rfl: 0   traZODone  (DESYREL ) 50 MG tablet, Take 0.5-1 tablets (25-50 mg total) by mouth at bedtime as needed for sleep. (Patient not taking: Reported on 07/24/2024), Disp: 10 tablet, Rfl: 0  "

## 2024-07-25 LAB — PROTEIN C, TOTAL: Protein C, Total: 134 % (ref 60–150)

## 2024-07-26 ENCOUNTER — Inpatient Hospital Stay

## 2024-07-26 ENCOUNTER — Other Ambulatory Visit: Payer: Self-pay | Admitting: Nurse Practitioner

## 2024-07-26 LAB — HEX PHASE PHOSPHOLIPID REFLEX

## 2024-07-26 LAB — PROTHROMBIN GENE MUTATION

## 2024-07-26 LAB — LUPUS ANTICOAGULANT
DRVVT: 43 s (ref 0.0–47.0)
PTT Lupus Anticoagulant: 30.9 s (ref 0.0–43.5)
Thrombin Time: 20 s (ref 0.0–23.0)
dPT Confirm Ratio: 0.95 ratio (ref 0.00–1.34)
dPT: 33.4 s (ref 0.0–47.6)

## 2024-07-26 LAB — PROTEIN S PANEL
Protein S Activity: 75 % (ref 63–140)
Protein S Ag, Free: 105 % (ref 61–136)
Protein S Ag, Total: 127 % (ref 60–150)

## 2024-07-26 LAB — HEXAGONAL PHASE PHOSPHOLIPID: Hex Phosph Neut Test: 4 s (ref 0–11)

## 2024-07-26 LAB — BETA-2-GLYCOPROTEIN I ABS, IGG/M/A
Beta-2 Glyco I IgG: <9 GPI IgG units (ref 0–20)
Beta-2-Glycoprotein I IgA: <9 GPI IgA units (ref 0–25)
Beta-2-Glycoprotein I IgM: <9 GPI IgM units (ref 0–32)

## 2024-07-27 LAB — FACTOR 5 LEIDEN

## 2024-07-27 LAB — CARDIOLIPIN ANTIBODIES, IGG, IGM, IGA
Anticardiolipin IgA: <9 U/mL (ref 0–11)
Anticardiolipin IgG: <9 GPL U/mL (ref 0–14)
Anticardiolipin IgM: <9 [MPL'U]/mL (ref 0–12)

## 2024-07-28 ENCOUNTER — Encounter: Payer: Self-pay | Admitting: Oncology

## 2024-07-30 ENCOUNTER — Inpatient Hospital Stay

## 2024-07-30 VITALS — BP 144/84 | HR 100 | Temp 99.0°F | Resp 18

## 2024-07-30 DIAGNOSIS — D509 Iron deficiency anemia, unspecified: Secondary | ICD-10-CM | POA: Diagnosis not present

## 2024-07-30 MED ORDER — IRON SUCROSE 20 MG/ML IV SOLN
200.0000 mg | Freq: Once | INTRAVENOUS | Status: AC
  Administered 2024-07-30: 200 mg via INTRAVENOUS
  Filled 2024-07-30: qty 10

## 2024-07-30 NOTE — Patient Instructions (Signed)

## 2024-07-31 ENCOUNTER — Inpatient Hospital Stay

## 2024-08-02 ENCOUNTER — Inpatient Hospital Stay

## 2024-08-02 VITALS — BP 138/76 | HR 110 | Temp 99.1°F | Resp 18

## 2024-08-02 DIAGNOSIS — D509 Iron deficiency anemia, unspecified: Secondary | ICD-10-CM | POA: Diagnosis not present

## 2024-08-02 MED ORDER — IRON SUCROSE 20 MG/ML IV SOLN
200.0000 mg | Freq: Once | INTRAVENOUS | Status: AC
  Administered 2024-08-02: 200 mg via INTRAVENOUS

## 2024-08-02 NOTE — Patient Instructions (Signed)

## 2024-08-06 ENCOUNTER — Inpatient Hospital Stay

## 2024-08-07 ENCOUNTER — Inpatient Hospital Stay

## 2024-08-09 ENCOUNTER — Inpatient Hospital Stay

## 2024-08-09 VITALS — BP 139/86 | HR 108 | Temp 99.0°F | Resp 16

## 2024-08-09 DIAGNOSIS — D509 Iron deficiency anemia, unspecified: Secondary | ICD-10-CM

## 2024-08-09 MED ORDER — IRON SUCROSE 20 MG/ML IV SOLN
200.0000 mg | Freq: Once | INTRAVENOUS | Status: AC
  Administered 2024-08-09: 200 mg via INTRAVENOUS
  Filled 2024-08-09: qty 10

## 2024-08-09 NOTE — Progress Notes (Signed)
 Declined post-observation. Aware of risks. Vitals stable at discharge.

## 2024-08-09 NOTE — Patient Instructions (Signed)
 Iron  Sucrose Injection What is this medication? IRON  SUCROSE (EYE ern SOO krose) treats low levels of iron  (iron  deficiency anemia) in people with kidney disease. Iron  is a mineral that plays an important role in making red blood cells, which carry oxygen from your lungs to the rest of your body. This medicine may be used for other purposes; ask your health care provider or pharmacist if you have questions. COMMON BRAND NAME(S): Venofer  What should I tell my care team before I take this medication? They need to know if you have any of these conditions: Anemia not caused by low iron  levels Heart disease High levels of iron  in the blood Kidney disease Liver disease An unusual or allergic reaction to iron , other medications, foods, dyes, or preservatives Pregnant or trying to get pregnant Breastfeeding How should I use this medication? This medication is infused into a vein. It is given by your care team in a hospital or clinic setting. Talk to your care team about the use of this medication in children. While it may be prescribed for children as young as 2 years for selected conditions, precautions do apply. Overdosage: If you think you have taken too much of this medicine contact a poison control center or emergency room at once. NOTE: This medicine is only for you. Do not share this medicine with others. What if I miss a dose? Keep appointments for follow-up doses. It is important not to miss your dose. Call your care team if you are unable to keep an appointment. What may interact with this medication? Do not take this medication with any of the following: Deferoxamine Dimercaprol Other iron  products This medication may also interact with the following: Chloramphenicol Deferasirox This list may not describe all possible interactions. Give your health care provider a list of all the medicines, herbs, non-prescription drugs, or dietary supplements you use. Also tell them if you smoke,  drink alcohol, or use illegal drugs. Some items may interact with your medicine. What should I watch for while using this medication? Your condition will be monitored carefully while you are receiving this medication. Tell your care team if your symptoms do not start to get better or if they get worse. You may need blood work done while you are taking this medication. Sometimes, when medications are infused into veins, a little can leak out of the vein and into the tissue around it. If this medication leaks, it can cause a brown or dark stain on the skin. This is not common. It may be permanent. If you feel pain or swelling during your infusion, tell your care team right away. They can stop the infusion and treat the area. You may need to eat more foods that contain iron . Talk to your care team. Foods that contain iron  include whole grains or cereals, dried fruits, beans, peas, leafy green vegetables, and organ meats (liver, kidney). What side effects may I notice from receiving this medication? Side effects that you should report to your care team as soon as possible: Allergic reactions--skin rash, itching, hives, swelling of the face, lips, tongue, or throat Low blood pressure--dizziness, feeling faint or lightheaded, blurry vision Painful swelling, warmth, or redness of the skin, brown or dark skin color at the infusion site Shortness of breath Side effects that usually do not require medical attention (report these to your care team if they continue or are bothersome): Flushing Headache Joint pain Muscle pain Nausea This list may not describe all possible side effects. Call your  doctor for medical advice about side effects. You may report side effects to FDA at 1-800-FDA-1088. Where should I keep my medication? This medication is given in a hospital or clinic. It will not be stored at home. NOTE: This sheet is a summary. It may not cover all possible information. If you have questions about  this medicine, talk to your doctor, pharmacist, or health care provider.  2025 Elsevier/Gold Standard (2024-05-16 00:00:00)

## 2024-08-12 ENCOUNTER — Telehealth: Payer: Self-pay | Admitting: Oncology

## 2024-08-12 ENCOUNTER — Encounter: Payer: Self-pay | Admitting: Oncology

## 2024-08-12 NOTE — Telephone Encounter (Signed)
 Called pt and informed her that we will be closed tomorrow due to snow.We got her rescheduled to Thursday. She confirmed that this would work for her

## 2024-08-13 ENCOUNTER — Inpatient Hospital Stay: Payer: Self-pay | Admitting: Oncology

## 2024-08-13 ENCOUNTER — Inpatient Hospital Stay: Payer: Self-pay

## 2024-08-14 ENCOUNTER — Inpatient Hospital Stay: Admitting: Oncology

## 2024-08-14 ENCOUNTER — Inpatient Hospital Stay

## 2024-08-16 ENCOUNTER — Inpatient Hospital Stay: Payer: Self-pay

## 2024-08-16 ENCOUNTER — Inpatient Hospital Stay: Payer: Self-pay | Admitting: Nurse Practitioner

## 2024-08-16 VITALS — BP 127/77 | HR 101

## 2024-08-16 VITALS — BP 128/75 | HR 115 | Temp 99.7°F | Resp 18 | Ht 65.0 in | Wt 195.0 lb

## 2024-08-16 DIAGNOSIS — O2230 Deep phlebothrombosis in pregnancy, unspecified trimester: Secondary | ICD-10-CM

## 2024-08-16 DIAGNOSIS — N921 Excessive and frequent menstruation with irregular cycle: Secondary | ICD-10-CM

## 2024-08-16 DIAGNOSIS — D509 Iron deficiency anemia, unspecified: Secondary | ICD-10-CM

## 2024-08-16 LAB — CBC (CANCER CENTER ONLY)
HCT: 35.2 % — ABNORMAL LOW (ref 36.0–46.0)
Hemoglobin: 9.8 g/dL — ABNORMAL LOW (ref 12.0–15.0)
MCH: 21.4 pg — ABNORMAL LOW (ref 26.0–34.0)
MCHC: 27.8 g/dL — ABNORMAL LOW (ref 30.0–36.0)
MCV: 77 fL — ABNORMAL LOW (ref 80.0–100.0)
Platelet Count: 314 10*3/uL (ref 150–400)
RBC: 4.57 MIL/uL (ref 3.87–5.11)
RDW: 27 % — ABNORMAL HIGH (ref 11.5–15.5)
WBC Count: 7.1 10*3/uL (ref 4.0–10.5)
nRBC: 0 % (ref 0.0–0.2)

## 2024-08-16 MED ORDER — IRON SUCROSE 20 MG/ML IV SOLN
200.0000 mg | Freq: Once | INTRAVENOUS | Status: AC
  Administered 2024-08-16: 200 mg via INTRAVENOUS
  Filled 2024-08-16: qty 10

## 2024-08-17 ENCOUNTER — Encounter: Payer: Self-pay | Admitting: Oncology

## 2024-08-17 NOTE — Progress Notes (Signed)
 "  Hematology/Oncology Consult note [facility] Telephone:([phone] [phone]  Patient Care Team: [facility] as PCP - General Melanee Annah BROCKS, MD as Consulting Physician (Oncology)   Name of the patient: Jocelyn [name]  [id number]  [birth year]    Reason for referral-iron  deficiency anemia   Referring physician-[facility] patient  Date of visit: 08/17/24   History of presenting illness-patient is a 44 year old female with a past medical history significant for hypertension, PAD s/p left AKA, diabetes, history of DVT on Eliquis , hyperlipidemia among other medical problems.  She has been referred for iron  deficiency anemia.  CBC from December 2025 showed white count of 9.9, H&H of 7.6/29.1 with an MCV of 68 and a platelet count of 454.  BMP was within normal limits.  LFTs were normal.  I do not see any iron  studies that were recently ordered.  Patient has never had an EGD or colonoscopy looking back at her prior imaging studies patient had ultrasound of her left lower extremity in July 2023 which showed occlusive thrombus in the left lower extremity from popliteal vein to the femoral vein.  CT angiogram showed thrombus extending into the lumen of the common iliac artery along with occlusion of superficial femoral artery patient has remained on Eliquis  since then   She has severe iron  deficiency anemia, with a hemoglobin of 7.5 g/dL measured last month. She experiences menorrhagia, described as very heavy menstrual cycles, and is not currently receiving any gynecologic treatment. She prefers to continue iron  infusions rather than pursue hormonal or surgical interventions. She denies hematuria or hematochezia.   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 She has been on apixaban  since that time without further thrombotic events. She expresses  significant concern about holding anticoagulation for procedures, associating discontinuation with her prior amputation, and is apprehensive about stopping apixaban  even temporarily.  She has a family history of colon cancer in her mother and a personal history of colon cancer in 2011, for which she undergoes colonoscopy every two years. She has not had any recent symptoms of gastrointestinal bleeding.   Today she presents for follow up on significant iron  deficiency anemia.  Following four intravenous iron  infusions, her hemoglobin has improved to 9.8 g/dL. She is scheduled to complete a total of five infusions, with one remaining. Prior to treatment, she experienced persistent fatigue but denies dizziness, syncope, or dyspnea. She has previously required four blood transfusions for severe anemia.  She continues to experience heavy menstrual bleeding and is currently or recently experiencing vaginal bleeding. She expresses a strong desire to address the ongoing menorrhagia.  She agrees to a referral to GI and OBGYN for screening needs and assistance with heavy menstrual bleeding.      ECOG PS- 1  Pain scale- 0   Review of systems- Review of Systems  Constitutional:  Positive for malaise/fatigue. Negative for chills, fever and weight loss.  HENT:  Negative for congestion, ear discharge and nosebleeds.   Eyes:  Negative for blurred vision.  Respiratory:  Negative for cough, hemoptysis, sputum production, shortness of breath and wheezing.   Cardiovascular:  Negative for chest pain, palpitations, orthopnea and claudication.  Gastrointestinal:  Negative for abdominal pain, blood in stool, constipation, diarrhea, heartburn, melena, nausea and vomiting.  Genitourinary:  Negative for dysuria, flank pain, frequency, hematuria and urgency.  Musculoskeletal:  Negative for back pain, joint pain and myalgias.  Skin:  Negative for rash.  Neurological:  Negative for dizziness, tingling, focal weakness,  seizures, weakness and headaches.  Endo/Heme/Allergies:  Does not bruise/bleed easily.  Psychiatric/Behavioral:  Negative for depression and suicidal ideas. The patient does not have insomnia.     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   Acute urinary retention 01/19/2022   Fever 01/18/2022   Acute deep vein thrombosis (DVT) of left femoral vein (HCC) 01/17/2022   Hypomagnesemia 01/06/2022   Hyponatremia 01/06/2022   Critical limb ischemia of left lower extremity (HCC) 01/10/2020   Disturbance of skin sensation 12/21/2019   Diabetes mellitus without complication (HCC) 12/17/2019   IDA (iron  deficiency anemia) 12/17/2019   PAD (peripheral artery disease) 12/17/2019   HTN (hypertension) 12/17/2019   Routine history and physical examination of adult 08/31/2019   Body mass index 34.0-34.9, adult 08/03/2019   Tobacco use disorder 01/24/2014     Past Medical History:  Diagnosis Date   IDA (iron  deficiency anemia) 12/17/2019     Past Surgical History:  Procedure Laterality Date   AMPUTATION Left 01/20/2022   Procedure: AMPUTATION ABOVE KNEE;  Surgeon: Marea Selinda RAMAN, MD;  Location: [facility];  Service: Vascular;  Laterality: Left;   APPENDECTOMY     LOWER EXTREMITY ANGIOGRAPHY Left 01/11/2020   Procedure: Lower Extremity Angiography;  Surgeon: Marea Selinda RAMAN, MD;  Location: [facility];  Service: Cardiovascular;  Laterality: Left;   LOWER EXTREMITY  ANGIOGRAPHY Left 03/06/2020   Procedure: Lower Extremity Angiography;  Surgeon: Marea Selinda RAMAN, MD;  Location: [facility];  Service: Cardiovascular;  Laterality: Left;   LOWER EXTREMITY ANGIOGRAPHY Left 03/07/2020   Procedure: Lower Extremity Angiography;  Surgeon: Marea Selinda RAMAN, MD;  Location: [facility];  Service: Cardiovascular;  Laterality: Left;   LOWER EXTREMITY ANGIOGRAPHY Left 01/06/2022   Procedure: Lower Extremity Angiography;  Surgeon: Marea Selinda RAMAN, MD;  Location: [facility];  Service: Cardiovascular;  Laterality: Left;   LOWER EXTREMITY ANGIOGRAPHY Left 01/07/2022   Procedure: Lower Extremity Angiography;  Surgeon: Marea Selinda RAMAN, MD;  Location: [facility];  Service: Cardiovascular;  Laterality: Left;   LOWER EXTREMITY ANGIOGRAPHY Left 01/18/2022   Procedure: Lower Extremity Angiography;  Surgeon: Marea Selinda RAMAN, MD;  Location: [facility];  Service: Cardiovascular;  Laterality: Left;   LOWER EXTREMITY INTERVENTION Bilateral 01/17/2022   Procedure: LOWER EXTREMITY INTERVENTION;  Surgeon: Clarice Martin, MD;  Location: [facility];  Service: Cardiovascular;  Laterality: Bilateral;   PERIPHERAL VASCULAR THROMBECTOMY Left 01/20/2022   Procedure: PERIPHERAL VASCULAR THROMBECTOMY Left Femoral vein;  Surgeon: Marea Selinda RAMAN, MD;  Location: [facility];  Service: Vascular;  Laterality: Left;    Social History   Socioeconomic History   Marital status: Legally Separated    Spouse name: Not on file   Number of children: Not on file   Years of education: Not on file   Highest education level: Not on file  Occupational History   Not on file  Tobacco Use   Smoking status: Every Day    Current packs/day: 0.50    Types: Cigarettes   Smokeless tobacco: Never  Vaping Use   Vaping status: Never Used  Substance and Sexual Activity   Alcohol use: Yes   Drug use: Never   Sexual activity: Not Currently    Birth control/protection: None  Other Topics  Concern   Not on file  Social History Narrative   Not on file   Social Drivers of Health   Tobacco Use: High Risk (07/28/2024)   Patient History    Smoking Tobacco Use: Every Day    Smokeless Tobacco Use: Never    Passive Exposure: Not on file  Financial Resource Strain: High Risk (09/07/2023)   Received from [facility]   Overall Financial Resource Strain (CARDIA)    Difficulty of Paying Living Expenses: Very hard  Food Insecurity: No Food Insecurity (07/24/2024)   Epic    Worried About Radiation Protection Practitioner of Food in the Last Year: Never true    Ran Out of Food in the Last Year: Never true  Transportation Needs: No Transportation Needs (07/24/2024)   Epic    Lack of Transportation (Medical): No    Lack of Transportation (Non-Medical): No  Physical Activity: Not on file  Stress: Not on file  Social Connections: Not on file  Intimate Partner Violence: Not At Risk (07/24/2024)   Epic    Fear of Current or Ex-Partner: No    Emotionally Abused: No    Physically Abused: No    Sexually Abused: No  Depression (PHQ2-9): Low Risk (08/16/2024)   Depression (PHQ2-9)    PHQ-2 Score: 0  Alcohol Screen: Not on file  Housing: Low Risk (07/24/2024)   Epic    Unable to Pay for Housing in the Last Year: No    Number of Times Moved in the Last Year: 0    Homeless in the Last Year: No  Utilities: Not At Risk (07/24/2024)   Epic    Threatened with loss of utilities: No  Health Literacy: Not on file     Family History  Problem Relation Age of Onset   Hypertension Mother    Hypertension Sister    Hypertension Brother     Current Medications[2]   Physical exam:  Vitals:   08/16/24 1437 08/16/24 1439  BP:  128/75  Pulse:  (!) 115  Resp: 18   Temp:  99.7 F (37.6 C)  Weight: 195 lb (88.5 kg)   Height: 5' 5 (1.651 m)    Physical Exam Cardiovascular:     Rate and Rhythm: Normal rate and regular rhythm.     Heart sounds: Normal heart sounds.  Pulmonary:     Effort:  Pulmonary effort is normal.     Breath sounds: Normal breath sounds.  Abdominal:     General: Bowel sounds are normal.     Palpations: Abdomen is soft.  Musculoskeletal:     Comments: S/p left AKA. Prosthesis in place  Skin:    General: Skin is warm and dry.  Neurological:     Mental Status: She is alert and oriented to person, place, and time.           Latest Ref Rng & Units 02/01/2022    8:07 AM  CMP  Glucose 70 - 99 mg/dL 871   BUN 6 - 20 mg/dL 13   Creatinine 9.55 - 1.00 mg/dL 9.33   Sodium 864 - 854 mmol/L 139  Potassium 3.5 - 5.1 mmol/L 3.7   Chloride 98 - 111 mmol/L 105   CO2 22 - 32 mmol/L 24   Calcium 8.9 - 10.3 mg/dL 89.6       Latest Ref Rng & Units 08/16/2024    2:16 PM  CBC  WBC 4.0 - 10.5 K/uL 7.1   Hemoglobin 12.0 - 15.0 g/dL 9.8   Hematocrit 63.9 - 46.0 % 35.2   Platelets 150 - 400 K/uL 314    Component Ref Range & Units (hover) 3 wk ago 2 yr ago 4 yr ago  Iron  12 Low  15 Low  15 Low   TIBC 595 High  487 High  556 High   Saturation Ratios 2 Low  3 Low  3 Low   UIBC 583      Assessment and plan- Patient is a 44 y.o. female referred for iron  deficiency anemia  Assessment and Plan    Iron  deficiency anemia Severe, chronic iron  deficiency anemia likely due to menorrhagia. Hemoglobin remains low. IV iron  is a temporary measure unless underlying cause is addressed. Evaluation for other blood loss sources, including gastrointestinal, is necessary due to colon cancer history. She prefers iron  infusions over hormonal or surgical interventions. - S/P 3 IV venofers Hg improved to 9.8 Iron  12 TIBC 595 Ferritin 28 Iron  sat 2 from 07/24/24 - Planned five doses of IV iron  (Venofer ) over two weeks per insurance requirements.  Proceed with IV iron  today  - Reviewed rare risk of allergic reaction to IV iron  and discussed need for alternative therapy if reaction occurs. - Provided education regarding the limitations of IV iron  as a temporary solution and the necessity  of addressing the underlying cause of blood loss. - Referred to gynecology for definitive management of menorrhagia. - Referred to gastroenterology for evaluation of gastrointestinal blood loss and colon cancer screening.   History of deep vein thrombosis Left lower extremity DVT in 2023, managed with Eliquis . No recurrent thrombotic events. Etiology unclear; possible factors include diabetes and recent COVID vaccination. She is apprehensive about holding Eliquis  for procedures due to prior amputation. Discussed holding Eliquis  for colonoscopy with reassurance on safety of short-term interruption. - Ordered laboratory evaluation for hypercoagulable states and other thrombosis risk factors. - Discussed potential need to hold Eliquis  for 2-3 days prior to colonoscopy with polypectomy, with further coordination with vascular surgery and gastroenterology as needed.  Heavy menstrual bleeding Persistent heavy menstrual bleeding likely causing iron  deficiency anemia. Evaluation for other blood loss sources pending. -referred to GYN    Thank you for this kind referral and the opportunity to participate in the care of this patient   Visit Diagnosis 1. Iron  deficiency anemia, unspecified iron  deficiency anemia type     Follow up plan:  Proceed with Iron  today and as scheduled F/U in 6 weeks see MD cbc, ferritin, iron  and TIBC possible venofer  LP    Morna Husband AGNP-C [facility] [phone] 08/17/2024                     [1] No Known Allergies [2]  Current Outpatient Medications:    acetaminophen  (TYLENOL ) 325 MG tablet, Take 1-2 tablets (325-650 mg total) by mouth every 4 (four) hours as needed for mild pain., Disp: , Rfl:    amLODipine  (NORVASC ) 10 MG tablet, Take 1 tablet (10 mg total) by mouth daily., Disp: 30 tablet, Rfl: 0   apixaban  (ELIQUIS ) 5 MG TABS tablet, Take 1 tablet (5  mg total) by mouth 2 (two) times daily., Disp: 60 tablet, Rfl:  1   apixaban  (ELIQUIS ) 5 MG TABS tablet, Take 1 tablet (5 mg total) by mouth 2 (two) times daily., Disp: 60 tablet, Rfl: 5   atorvastatin (LIPITOR) 20 MG tablet, Take 20 mg by mouth daily., Disp: , Rfl:    Ferrous Sulfate  (IRON ) 325 (65 Fe) MG TABS, Take 1 tablet (325 mg total) by mouth daily., Disp: 60 tablet, Rfl: 0   hydrochlorothiazide  (HYDRODIURIL ) 25 MG tablet, Take 1 tablet (25 mg total) by mouth daily., Disp: 30 tablet, Rfl: 0   pregabalin  (LYRICA ) 150 MG capsule, TAKE 1 CAPSULE BY MOUTH 2 TIMES A DAY., Disp: 60 capsule, Rfl: 3   methocarbamol  (ROBAXIN ) 500 MG tablet, Take 1 tablet (500 mg total) by mouth every 6 (six) hours as needed for muscle spasms. (Patient not taking: Reported on 08/16/2024), Disp: 60 tablet, Rfl: 0   mupirocin  ointment (BACTROBAN ) 2 %, Apply 1 Application topically daily. (Patient not taking: Reported on 08/16/2024), Disp: 30 g, Rfl: 2   nicotine  (NICODERM CQ  - DOSED IN MG/24 HOURS) 21 mg/24hr patch, Place 1 patch (21 mg total) onto the skin daily. (Patient not taking: Reported on 08/16/2024), Disp: 28 patch, Rfl: 0   oxyCODONE  (OXY IR/ROXICODONE ) 5 MG immediate release tablet, Take 1 tablet (5 mg total) by mouth every 6 (six) hours as needed for severe pain. (Patient not taking: Reported on 08/16/2024), Disp: 28 tablet, Rfl: 0   potassium chloride  SA (KLOR-CON  M) 20 MEQ tablet, Take 1 tablet (20 mEq total) by mouth daily. (Patient not taking: Reported on 08/16/2024), Disp: 30 tablet, Rfl: 0   traZODone  (DESYREL ) 50 MG tablet, Take 0.5-1 tablets (25-50 mg total) by mouth at bedtime as needed for sleep. (Patient not taking: Reported on 08/16/2024), Disp: 10 tablet, Rfl: 0  "

## 2024-08-20 ENCOUNTER — Inpatient Hospital Stay: Payer: Self-pay

## 2024-08-23 ENCOUNTER — Inpatient Hospital Stay: Payer: Self-pay

## 2024-08-24 ENCOUNTER — Encounter (INDEPENDENT_AMBULATORY_CARE_PROVIDER_SITE_OTHER)

## 2024-08-24 ENCOUNTER — Ambulatory Visit (INDEPENDENT_AMBULATORY_CARE_PROVIDER_SITE_OTHER): Admitting: Vascular Surgery

## 2024-10-10 ENCOUNTER — Inpatient Hospital Stay: Payer: Self-pay | Admitting: Oncology

## 2024-10-10 ENCOUNTER — Inpatient Hospital Stay: Payer: Self-pay
